# Patient Record
Sex: Male | Born: 1960
Health system: Southern US, Community
[De-identification: ages and names within clinical notes are randomized; demographics above are authoritative.]

## PROBLEM LIST (undated history)

## (undated) DIAGNOSIS — K219 Gastro-esophageal reflux disease without esophagitis: Secondary | ICD-10-CM

## (undated) DIAGNOSIS — E785 Hyperlipidemia, unspecified: Secondary | ICD-10-CM

## (undated) DIAGNOSIS — F419 Anxiety disorder, unspecified: Secondary | ICD-10-CM

## (undated) DIAGNOSIS — F32A Depression, unspecified: Secondary | ICD-10-CM

## (undated) DIAGNOSIS — F329 Major depressive disorder, single episode, unspecified: Secondary | ICD-10-CM

## (undated) DIAGNOSIS — R51 Headache: Secondary | ICD-10-CM

## (undated) DIAGNOSIS — Z531 Procedure and treatment not carried out because of patient's decision for reasons of belief and group pressure: Secondary | ICD-10-CM

## (undated) DIAGNOSIS — J449 Chronic obstructive pulmonary disease, unspecified: Secondary | ICD-10-CM

## (undated) DIAGNOSIS — M5020 Other cervical disc displacement, unspecified cervical region: Secondary | ICD-10-CM

## (undated) DIAGNOSIS — I10 Essential (primary) hypertension: Secondary | ICD-10-CM

## (undated) DIAGNOSIS — M1990 Unspecified osteoarthritis, unspecified site: Secondary | ICD-10-CM

## (undated) DIAGNOSIS — G5600 Carpal tunnel syndrome, unspecified upper limb: Secondary | ICD-10-CM

## (undated) DIAGNOSIS — T7840XA Allergy, unspecified, initial encounter: Secondary | ICD-10-CM

## (undated) DIAGNOSIS — G4730 Sleep apnea, unspecified: Secondary | ICD-10-CM

## (undated) DIAGNOSIS — IMO0001 Reserved for inherently not codable concepts without codable children: Secondary | ICD-10-CM

## (undated) DIAGNOSIS — G629 Polyneuropathy, unspecified: Secondary | ICD-10-CM

## (undated) DIAGNOSIS — Z889 Allergy status to unspecified drugs, medicaments and biological substances status: Secondary | ICD-10-CM

## (undated) DIAGNOSIS — J189 Pneumonia, unspecified organism: Secondary | ICD-10-CM

## (undated) HISTORY — DX: Anxiety disorder, unspecified: F41.9

## (undated) HISTORY — PX: COLONOSCOPY: SHX174

## (undated) HISTORY — DX: Hyperlipidemia, unspecified: E78.5

## (undated) HISTORY — DX: Allergy, unspecified, initial encounter: T78.40XA

## (undated) HISTORY — PX: UPPER GASTROINTESTINAL ENDOSCOPY: SHX188

## (undated) HISTORY — DX: Chronic obstructive pulmonary disease, unspecified: J44.9

## (undated) HISTORY — PX: CARPAL TUNNEL RELEASE: SHX101

---

## 1998-06-01 ENCOUNTER — Emergency Department (HOSPITAL_COMMUNITY): Admission: EM | Admit: 1998-06-01 | Discharge: 1998-06-01 | Payer: Self-pay | Admitting: Emergency Medicine

## 1998-07-24 ENCOUNTER — Encounter: Payer: Self-pay | Admitting: Emergency Medicine

## 1998-07-24 ENCOUNTER — Emergency Department (HOSPITAL_COMMUNITY): Admission: EM | Admit: 1998-07-24 | Discharge: 1998-07-24 | Payer: Self-pay | Admitting: Emergency Medicine

## 1999-05-29 ENCOUNTER — Emergency Department (HOSPITAL_COMMUNITY): Admission: EM | Admit: 1999-05-29 | Discharge: 1999-05-29 | Payer: Self-pay | Admitting: Emergency Medicine

## 1999-09-16 ENCOUNTER — Emergency Department (HOSPITAL_COMMUNITY): Admission: EM | Admit: 1999-09-16 | Discharge: 1999-09-16 | Payer: Self-pay | Admitting: Emergency Medicine

## 1999-09-18 ENCOUNTER — Encounter: Admission: RE | Admit: 1999-09-18 | Discharge: 1999-09-18 | Payer: Self-pay | Admitting: Hematology and Oncology

## 1999-09-25 ENCOUNTER — Encounter: Admission: RE | Admit: 1999-09-25 | Discharge: 1999-09-25 | Payer: Self-pay | Admitting: Hematology and Oncology

## 1999-11-06 ENCOUNTER — Encounter: Admission: RE | Admit: 1999-11-06 | Discharge: 1999-11-06 | Payer: Self-pay | Admitting: Internal Medicine

## 2000-05-20 ENCOUNTER — Encounter: Admission: RE | Admit: 2000-05-20 | Discharge: 2000-05-20 | Payer: Self-pay | Admitting: Internal Medicine

## 2000-06-26 ENCOUNTER — Emergency Department (HOSPITAL_COMMUNITY): Admission: EM | Admit: 2000-06-26 | Discharge: 2000-06-26 | Payer: Self-pay

## 2000-06-26 ENCOUNTER — Encounter: Payer: Self-pay | Admitting: Emergency Medicine

## 2001-01-07 ENCOUNTER — Encounter: Admission: RE | Admit: 2001-01-07 | Discharge: 2001-01-07 | Payer: Self-pay | Admitting: Family Medicine

## 2001-01-07 ENCOUNTER — Encounter: Payer: Self-pay | Admitting: Family Medicine

## 2001-01-26 ENCOUNTER — Emergency Department (HOSPITAL_COMMUNITY): Admission: EM | Admit: 2001-01-26 | Discharge: 2001-01-26 | Payer: Self-pay | Admitting: *Deleted

## 2001-02-17 HISTORY — PX: OTHER SURGICAL HISTORY: SHX169

## 2001-07-23 ENCOUNTER — Ambulatory Visit (HOSPITAL_BASED_OUTPATIENT_CLINIC_OR_DEPARTMENT_OTHER): Admission: RE | Admit: 2001-07-23 | Discharge: 2001-07-23 | Payer: Self-pay | Admitting: Orthopedic Surgery

## 2001-08-05 ENCOUNTER — Encounter: Admission: RE | Admit: 2001-08-05 | Discharge: 2001-10-01 | Payer: Self-pay | Admitting: Orthopedic Surgery

## 2001-12-06 ENCOUNTER — Inpatient Hospital Stay (HOSPITAL_COMMUNITY): Admission: EM | Admit: 2001-12-06 | Discharge: 2001-12-16 | Payer: Self-pay | Admitting: Emergency Medicine

## 2001-12-06 ENCOUNTER — Encounter: Payer: Self-pay | Admitting: Emergency Medicine

## 2001-12-07 ENCOUNTER — Encounter: Payer: Self-pay | Admitting: Orthopaedic Surgery

## 2001-12-20 ENCOUNTER — Encounter: Payer: Self-pay | Admitting: Emergency Medicine

## 2001-12-20 ENCOUNTER — Emergency Department (HOSPITAL_COMMUNITY): Admission: EM | Admit: 2001-12-20 | Discharge: 2001-12-21 | Payer: Self-pay | Admitting: Emergency Medicine

## 2002-01-06 ENCOUNTER — Encounter: Payer: Self-pay | Admitting: Emergency Medicine

## 2002-01-06 ENCOUNTER — Emergency Department (HOSPITAL_COMMUNITY): Admission: EM | Admit: 2002-01-06 | Discharge: 2002-01-06 | Payer: Self-pay | Admitting: Emergency Medicine

## 2002-01-11 ENCOUNTER — Encounter: Admission: RE | Admit: 2002-01-11 | Discharge: 2002-01-11 | Payer: Self-pay | Admitting: Cardiology

## 2002-01-11 ENCOUNTER — Encounter: Payer: Self-pay | Admitting: Cardiology

## 2002-01-21 ENCOUNTER — Encounter: Admission: RE | Admit: 2002-01-21 | Discharge: 2002-03-24 | Payer: Self-pay | Admitting: Neurology

## 2002-03-29 ENCOUNTER — Ambulatory Visit (HOSPITAL_BASED_OUTPATIENT_CLINIC_OR_DEPARTMENT_OTHER): Admission: RE | Admit: 2002-03-29 | Discharge: 2002-03-30 | Payer: Self-pay | Admitting: Orthopaedic Surgery

## 2002-04-07 ENCOUNTER — Encounter: Admission: RE | Admit: 2002-04-07 | Discharge: 2002-07-06 | Payer: Self-pay | Admitting: Orthopaedic Surgery

## 2002-06-27 ENCOUNTER — Encounter
Admission: RE | Admit: 2002-06-27 | Discharge: 2002-09-25 | Payer: Self-pay | Admitting: Physical Medicine & Rehabilitation

## 2002-08-17 ENCOUNTER — Encounter
Admission: RE | Admit: 2002-08-17 | Discharge: 2002-11-15 | Payer: Self-pay | Admitting: Physical Medicine & Rehabilitation

## 2002-08-26 ENCOUNTER — Encounter: Admission: RE | Admit: 2002-08-26 | Discharge: 2002-08-26 | Payer: Self-pay | Admitting: Cardiology

## 2002-08-26 ENCOUNTER — Encounter: Payer: Self-pay | Admitting: Cardiology

## 2002-09-13 ENCOUNTER — Encounter: Payer: Self-pay | Admitting: *Deleted

## 2002-09-13 ENCOUNTER — Ambulatory Visit (HOSPITAL_COMMUNITY): Admission: RE | Admit: 2002-09-13 | Discharge: 2002-09-13 | Payer: Self-pay | Admitting: *Deleted

## 2002-10-21 ENCOUNTER — Encounter
Admission: RE | Admit: 2002-10-21 | Discharge: 2003-01-19 | Payer: Self-pay | Admitting: Physical Medicine & Rehabilitation

## 2003-01-03 ENCOUNTER — Encounter: Admission: RE | Admit: 2003-01-03 | Discharge: 2003-04-03 | Payer: Self-pay

## 2003-02-18 HISTORY — PX: CERVICAL FUSION: SHX112

## 2003-03-31 ENCOUNTER — Encounter
Admission: RE | Admit: 2003-03-31 | Discharge: 2003-06-29 | Payer: Self-pay | Admitting: Physical Medicine & Rehabilitation

## 2003-08-18 ENCOUNTER — Emergency Department (HOSPITAL_COMMUNITY): Admission: EM | Admit: 2003-08-18 | Discharge: 2003-08-18 | Payer: Self-pay | Admitting: Emergency Medicine

## 2003-08-30 ENCOUNTER — Emergency Department (HOSPITAL_COMMUNITY): Admission: EM | Admit: 2003-08-30 | Discharge: 2003-08-30 | Payer: Self-pay | Admitting: Family Medicine

## 2003-12-19 ENCOUNTER — Ambulatory Visit (HOSPITAL_BASED_OUTPATIENT_CLINIC_OR_DEPARTMENT_OTHER): Admission: RE | Admit: 2003-12-19 | Discharge: 2003-12-19 | Payer: Self-pay | Admitting: Cardiology

## 2004-02-26 ENCOUNTER — Encounter: Admission: RE | Admit: 2004-02-26 | Discharge: 2004-03-20 | Payer: Self-pay | Admitting: Cardiology

## 2004-06-20 ENCOUNTER — Encounter: Admission: RE | Admit: 2004-06-20 | Discharge: 2004-06-20 | Payer: Self-pay | Admitting: Cardiology

## 2004-06-23 ENCOUNTER — Emergency Department (HOSPITAL_COMMUNITY): Admission: EM | Admit: 2004-06-23 | Discharge: 2004-06-23 | Payer: Self-pay | Admitting: Emergency Medicine

## 2004-08-17 ENCOUNTER — Ambulatory Visit (HOSPITAL_COMMUNITY): Admission: RE | Admit: 2004-08-17 | Discharge: 2004-08-17 | Payer: Self-pay | Admitting: Neurology

## 2004-10-31 ENCOUNTER — Emergency Department (HOSPITAL_COMMUNITY): Admission: EM | Admit: 2004-10-31 | Discharge: 2004-10-31 | Payer: Self-pay | Admitting: Family Medicine

## 2005-01-16 ENCOUNTER — Ambulatory Visit (HOSPITAL_COMMUNITY): Admission: RE | Admit: 2005-01-16 | Discharge: 2005-01-16 | Payer: Self-pay | Admitting: Neurological Surgery

## 2005-01-22 ENCOUNTER — Encounter: Admission: RE | Admit: 2005-01-22 | Discharge: 2005-01-22 | Payer: Self-pay | Admitting: Neurological Surgery

## 2005-02-14 ENCOUNTER — Ambulatory Visit (HOSPITAL_COMMUNITY): Admission: RE | Admit: 2005-02-14 | Discharge: 2005-02-14 | Payer: Self-pay | Admitting: Neurological Surgery

## 2005-03-13 ENCOUNTER — Ambulatory Visit (HOSPITAL_COMMUNITY): Admission: RE | Admit: 2005-03-13 | Discharge: 2005-03-14 | Payer: Self-pay | Admitting: Neurological Surgery

## 2005-03-22 ENCOUNTER — Emergency Department (HOSPITAL_COMMUNITY): Admission: AD | Admit: 2005-03-22 | Discharge: 2005-03-22 | Payer: Self-pay | Admitting: Emergency Medicine

## 2005-04-08 ENCOUNTER — Encounter: Admission: RE | Admit: 2005-04-08 | Discharge: 2005-04-08 | Payer: Self-pay | Admitting: Neurological Surgery

## 2005-04-24 ENCOUNTER — Encounter: Admission: RE | Admit: 2005-04-24 | Discharge: 2005-07-23 | Payer: Self-pay | Admitting: Neurology

## 2005-06-10 ENCOUNTER — Encounter: Admission: RE | Admit: 2005-06-10 | Discharge: 2005-06-10 | Payer: Self-pay | Admitting: Neurological Surgery

## 2005-10-20 ENCOUNTER — Emergency Department (HOSPITAL_COMMUNITY): Admission: EM | Admit: 2005-10-20 | Discharge: 2005-10-21 | Payer: Self-pay | Admitting: Emergency Medicine

## 2005-11-25 ENCOUNTER — Ambulatory Visit: Payer: Self-pay | Admitting: Family Medicine

## 2005-12-23 ENCOUNTER — Ambulatory Visit (HOSPITAL_COMMUNITY): Admission: RE | Admit: 2005-12-23 | Discharge: 2005-12-23 | Payer: Self-pay | Admitting: Orthopedic Surgery

## 2005-12-26 ENCOUNTER — Ambulatory Visit: Payer: Self-pay | Admitting: Family Medicine

## 2005-12-26 ENCOUNTER — Encounter: Admission: RE | Admit: 2005-12-26 | Discharge: 2005-12-26 | Payer: Self-pay | Admitting: Family Medicine

## 2006-01-14 ENCOUNTER — Ambulatory Visit (HOSPITAL_BASED_OUTPATIENT_CLINIC_OR_DEPARTMENT_OTHER): Admission: RE | Admit: 2006-01-14 | Discharge: 2006-01-14 | Payer: Self-pay | Admitting: Orthopedic Surgery

## 2006-01-21 ENCOUNTER — Ambulatory Visit: Payer: Self-pay | Admitting: Family Medicine

## 2006-01-22 ENCOUNTER — Encounter: Admission: RE | Admit: 2006-01-22 | Discharge: 2006-02-13 | Payer: Self-pay | Admitting: Orthopedic Surgery

## 2006-02-02 ENCOUNTER — Ambulatory Visit: Payer: Self-pay | Admitting: Family Medicine

## 2006-02-18 ENCOUNTER — Ambulatory Visit: Payer: Self-pay | Admitting: Family Medicine

## 2006-03-07 ENCOUNTER — Emergency Department (HOSPITAL_COMMUNITY): Admission: EM | Admit: 2006-03-07 | Discharge: 2006-03-07 | Payer: Self-pay | Admitting: Family Medicine

## 2006-04-16 DIAGNOSIS — F319 Bipolar disorder, unspecified: Secondary | ICD-10-CM | POA: Insufficient documentation

## 2006-04-16 DIAGNOSIS — K219 Gastro-esophageal reflux disease without esophagitis: Secondary | ICD-10-CM | POA: Insufficient documentation

## 2006-04-16 DIAGNOSIS — G4700 Insomnia, unspecified: Secondary | ICD-10-CM | POA: Insufficient documentation

## 2006-04-16 DIAGNOSIS — M1990 Unspecified osteoarthritis, unspecified site: Secondary | ICD-10-CM | POA: Insufficient documentation

## 2006-06-15 ENCOUNTER — Encounter (INDEPENDENT_AMBULATORY_CARE_PROVIDER_SITE_OTHER): Payer: Self-pay | Admitting: Family Medicine

## 2006-06-15 ENCOUNTER — Ambulatory Visit: Payer: Self-pay | Admitting: Sports Medicine

## 2006-06-15 DIAGNOSIS — J309 Allergic rhinitis, unspecified: Secondary | ICD-10-CM | POA: Insufficient documentation

## 2006-06-15 LAB — CONVERTED CEMR LAB
Calcium: 8.9 mg/dL (ref 8.4–10.5)
Phosphorus: 3.4 mg/dL (ref 2.3–4.6)
Sodium: 143 meq/L (ref 135–145)
Total Protein: 7 g/dL (ref 6.0–8.3)

## 2006-06-20 ENCOUNTER — Encounter (INDEPENDENT_AMBULATORY_CARE_PROVIDER_SITE_OTHER): Payer: Self-pay | Admitting: Family Medicine

## 2006-06-28 ENCOUNTER — Ambulatory Visit (HOSPITAL_COMMUNITY): Admission: RE | Admit: 2006-06-28 | Discharge: 2006-06-28 | Payer: Self-pay | Admitting: Anesthesiology

## 2006-06-30 ENCOUNTER — Encounter: Admission: RE | Admit: 2006-06-30 | Discharge: 2006-06-30 | Payer: Self-pay | Admitting: Neurological Surgery

## 2006-09-11 ENCOUNTER — Ambulatory Visit: Payer: Self-pay | Admitting: Family Medicine

## 2006-09-17 ENCOUNTER — Encounter: Admission: RE | Admit: 2006-09-17 | Discharge: 2006-09-17 | Payer: Self-pay | Admitting: Orthopedic Surgery

## 2006-10-01 ENCOUNTER — Encounter: Admission: RE | Admit: 2006-10-01 | Discharge: 2006-10-01 | Payer: Self-pay | Admitting: Orthopedic Surgery

## 2006-10-09 ENCOUNTER — Ambulatory Visit: Payer: Self-pay | Admitting: Family Medicine

## 2006-10-09 DIAGNOSIS — G43909 Migraine, unspecified, not intractable, without status migrainosus: Secondary | ICD-10-CM | POA: Insufficient documentation

## 2006-10-15 ENCOUNTER — Encounter: Admission: RE | Admit: 2006-10-15 | Discharge: 2006-10-15 | Payer: Self-pay | Admitting: Orthopedic Surgery

## 2006-11-19 ENCOUNTER — Encounter (INDEPENDENT_AMBULATORY_CARE_PROVIDER_SITE_OTHER): Payer: Self-pay | Admitting: Family Medicine

## 2006-11-24 ENCOUNTER — Telehealth (INDEPENDENT_AMBULATORY_CARE_PROVIDER_SITE_OTHER): Payer: Self-pay | Admitting: Family Medicine

## 2006-11-24 ENCOUNTER — Telehealth (INDEPENDENT_AMBULATORY_CARE_PROVIDER_SITE_OTHER): Payer: Self-pay | Admitting: *Deleted

## 2006-11-24 ENCOUNTER — Ambulatory Visit: Payer: Self-pay | Admitting: Family Medicine

## 2006-12-11 ENCOUNTER — Ambulatory Visit: Payer: Self-pay | Admitting: Family Medicine

## 2006-12-11 DIAGNOSIS — G4733 Obstructive sleep apnea (adult) (pediatric): Secondary | ICD-10-CM | POA: Insufficient documentation

## 2007-02-22 ENCOUNTER — Encounter: Admission: RE | Admit: 2007-02-22 | Discharge: 2007-02-22 | Payer: Self-pay | Admitting: *Deleted

## 2007-02-22 ENCOUNTER — Ambulatory Visit: Payer: Self-pay | Admitting: Family Medicine

## 2007-02-23 ENCOUNTER — Encounter (INDEPENDENT_AMBULATORY_CARE_PROVIDER_SITE_OTHER): Payer: Self-pay | Admitting: *Deleted

## 2007-02-23 ENCOUNTER — Ambulatory Visit: Payer: Self-pay | Admitting: Family Medicine

## 2007-02-23 LAB — CONVERTED CEMR LAB
Basophils Relative: 0 % (ref 0–1)
Eosinophils Absolute: 0.2 10*3/uL (ref 0.0–0.7)
HCT: 42 % (ref 39.0–52.0)
MCHC: 33.3 g/dL (ref 30.0–36.0)
MCV: 97.2 fL (ref 78.0–100.0)
Monocytes Absolute: 1.5 10*3/uL — ABNORMAL HIGH (ref 0.1–1.0)
Monocytes Relative: 23 % — ABNORMAL HIGH (ref 3–12)
Neutro Abs: 2.8 10*3/uL (ref 1.7–7.7)
Neutrophils Relative %: 44 % (ref 43–77)
RBC: 4.32 M/uL (ref 4.22–5.81)

## 2007-05-25 ENCOUNTER — Ambulatory Visit: Payer: Self-pay | Admitting: Sports Medicine

## 2007-05-25 DIAGNOSIS — M2170 Unequal limb length (acquired), unspecified site: Secondary | ICD-10-CM | POA: Insufficient documentation

## 2007-05-26 ENCOUNTER — Encounter: Admission: RE | Admit: 2007-05-26 | Discharge: 2007-05-26 | Payer: Self-pay | Admitting: Sports Medicine

## 2007-06-01 ENCOUNTER — Telehealth (INDEPENDENT_AMBULATORY_CARE_PROVIDER_SITE_OTHER): Payer: Self-pay | Admitting: Family Medicine

## 2007-06-02 ENCOUNTER — Ambulatory Visit: Payer: Self-pay | Admitting: Family Medicine

## 2007-06-02 ENCOUNTER — Telehealth: Payer: Self-pay | Admitting: Psychology

## 2007-06-02 ENCOUNTER — Encounter: Payer: Self-pay | Admitting: Psychology

## 2007-06-03 ENCOUNTER — Encounter: Payer: Self-pay | Admitting: Psychology

## 2007-06-03 LAB — CONVERTED CEMR LAB: Ammonia: 51 umol/L — ABNORMAL HIGH (ref 11–35)

## 2007-06-04 ENCOUNTER — Encounter (INDEPENDENT_AMBULATORY_CARE_PROVIDER_SITE_OTHER): Payer: Self-pay | Admitting: Family Medicine

## 2007-06-04 ENCOUNTER — Ambulatory Visit: Payer: Self-pay | Admitting: Family Medicine

## 2007-06-04 LAB — CONVERTED CEMR LAB
Hemoglobin: 16.6 g/dL (ref 13.0–17.0)
MCHC: 33.3 g/dL (ref 30.0–36.0)
MCV: 95.8 fL (ref 78.0–100.0)
Platelets: 234 10*3/uL (ref 150–400)
RBC: 5.21 M/uL (ref 4.22–5.81)
RDW: 12.8 % (ref 11.5–15.5)
WBC: 7.4 10*3/uL (ref 4.0–10.5)

## 2007-06-07 ENCOUNTER — Telehealth: Payer: Self-pay | Admitting: *Deleted

## 2007-06-07 ENCOUNTER — Encounter (INDEPENDENT_AMBULATORY_CARE_PROVIDER_SITE_OTHER): Payer: Self-pay | Admitting: Family Medicine

## 2007-08-15 ENCOUNTER — Emergency Department (HOSPITAL_COMMUNITY): Admission: EM | Admit: 2007-08-15 | Discharge: 2007-08-15 | Payer: Self-pay | Admitting: Family Medicine

## 2007-09-27 ENCOUNTER — Encounter: Payer: Self-pay | Admitting: *Deleted

## 2007-09-30 ENCOUNTER — Telehealth: Payer: Self-pay | Admitting: *Deleted

## 2007-10-13 ENCOUNTER — Ambulatory Visit: Payer: Self-pay | Admitting: Family Medicine

## 2007-10-13 ENCOUNTER — Encounter: Payer: Self-pay | Admitting: Family Medicine

## 2007-10-13 LAB — CONVERTED CEMR LAB
Calcium: 8.5 mg/dL (ref 8.4–10.5)
Total Bilirubin: 0.4 mg/dL (ref 0.3–1.2)
Total Protein: 6.1 g/dL (ref 6.0–8.3)

## 2007-10-15 ENCOUNTER — Telehealth: Payer: Self-pay | Admitting: *Deleted

## 2007-10-20 ENCOUNTER — Encounter: Payer: Self-pay | Admitting: Family Medicine

## 2007-10-21 ENCOUNTER — Encounter: Payer: Self-pay | Admitting: Family Medicine

## 2007-11-08 ENCOUNTER — Emergency Department (HOSPITAL_COMMUNITY): Admission: EM | Admit: 2007-11-08 | Discharge: 2007-11-08 | Payer: Self-pay | Admitting: Emergency Medicine

## 2007-11-16 ENCOUNTER — Ambulatory Visit (HOSPITAL_COMMUNITY): Admission: RE | Admit: 2007-11-16 | Discharge: 2007-11-16 | Payer: Self-pay | Admitting: Gastroenterology

## 2007-12-01 ENCOUNTER — Encounter: Payer: Self-pay | Admitting: Family Medicine

## 2007-12-08 ENCOUNTER — Ambulatory Visit: Payer: Self-pay | Admitting: Family Medicine

## 2007-12-08 ENCOUNTER — Telehealth: Payer: Self-pay | Admitting: Family Medicine

## 2007-12-09 ENCOUNTER — Encounter: Payer: Self-pay | Admitting: Family Medicine

## 2007-12-20 ENCOUNTER — Encounter: Payer: Self-pay | Admitting: Family Medicine

## 2007-12-20 ENCOUNTER — Ambulatory Visit: Payer: Self-pay | Admitting: Family Medicine

## 2007-12-20 LAB — CONVERTED CEMR LAB
Basophils Absolute: 0 10*3/uL (ref 0.0–0.1)
Eosinophils Absolute: 0.2 10*3/uL (ref 0.0–0.7)
Eosinophils Relative: 3 % (ref 0–5)
HCT: 45.4 % (ref 39.0–52.0)
MCHC: 33 g/dL (ref 30.0–36.0)
Monocytes Relative: 10 % (ref 3–12)
Neutrophils Relative %: 36 % — ABNORMAL LOW (ref 43–77)
RBC: 4.88 M/uL (ref 4.22–5.81)
WBC: 8.3 10*3/uL (ref 4.0–10.5)

## 2007-12-30 ENCOUNTER — Ambulatory Visit: Payer: Self-pay | Admitting: Family Medicine

## 2008-05-19 ENCOUNTER — Telehealth (INDEPENDENT_AMBULATORY_CARE_PROVIDER_SITE_OTHER): Payer: Self-pay | Admitting: *Deleted

## 2008-05-25 ENCOUNTER — Telehealth (INDEPENDENT_AMBULATORY_CARE_PROVIDER_SITE_OTHER): Payer: Self-pay | Admitting: *Deleted

## 2008-05-30 ENCOUNTER — Ambulatory Visit: Payer: Self-pay | Admitting: Family Medicine

## 2008-05-30 DIAGNOSIS — Z87891 Personal history of nicotine dependence: Secondary | ICD-10-CM

## 2008-05-30 DIAGNOSIS — Z720 Tobacco use: Secondary | ICD-10-CM | POA: Insufficient documentation

## 2008-05-31 ENCOUNTER — Telehealth: Payer: Self-pay | Admitting: *Deleted

## 2008-05-31 ENCOUNTER — Telehealth: Payer: Self-pay | Admitting: Family Medicine

## 2008-06-06 ENCOUNTER — Emergency Department (HOSPITAL_COMMUNITY): Admission: EM | Admit: 2008-06-06 | Discharge: 2008-06-06 | Payer: Self-pay | Admitting: Family Medicine

## 2008-06-07 ENCOUNTER — Telehealth: Payer: Self-pay | Admitting: Family Medicine

## 2008-06-09 ENCOUNTER — Telehealth (INDEPENDENT_AMBULATORY_CARE_PROVIDER_SITE_OTHER): Payer: Self-pay | Admitting: *Deleted

## 2008-06-12 ENCOUNTER — Encounter: Payer: Self-pay | Admitting: Family Medicine

## 2008-06-27 ENCOUNTER — Ambulatory Visit: Payer: Self-pay | Admitting: Family Medicine

## 2008-06-27 ENCOUNTER — Encounter: Payer: Self-pay | Admitting: Family Medicine

## 2008-06-27 LAB — CONVERTED CEMR LAB
ALT: 8 units/L (ref 0–53)
AST: 12 units/L (ref 0–37)
Albumin: 4 g/dL (ref 3.5–5.2)
Alkaline Phosphatase: 77 units/L (ref 39–117)
BUN: 12 mg/dL (ref 6–23)
CO2: 20 meq/L (ref 19–32)
Glucose, Bld: 81 mg/dL (ref 70–99)
HCT: 43.7 % (ref 39.0–52.0)
HDL: 34 mg/dL — ABNORMAL LOW (ref >39)
LDL Cholesterol: 71 mg/dL (ref 0–99)
MCHC: 33.6 g/dL (ref 30.0–36.0)
RBC: 4.72 M/uL (ref 4.22–5.81)
Total CHOL/HDL Ratio: 3.6
Triglycerides: 96 mg/dL (ref <150)

## 2008-06-28 ENCOUNTER — Telehealth (INDEPENDENT_AMBULATORY_CARE_PROVIDER_SITE_OTHER): Payer: Self-pay | Admitting: *Deleted

## 2008-06-28 ENCOUNTER — Ambulatory Visit: Payer: Self-pay | Admitting: Sports Medicine

## 2008-06-28 DIAGNOSIS — M25569 Pain in unspecified knee: Secondary | ICD-10-CM | POA: Insufficient documentation

## 2008-07-10 ENCOUNTER — Ambulatory Visit: Payer: Self-pay | Admitting: Family Medicine

## 2008-07-14 ENCOUNTER — Telehealth (INDEPENDENT_AMBULATORY_CARE_PROVIDER_SITE_OTHER): Payer: Self-pay | Admitting: Pharmacist

## 2008-07-21 ENCOUNTER — Encounter (INDEPENDENT_AMBULATORY_CARE_PROVIDER_SITE_OTHER): Payer: Self-pay | Admitting: Pharmacist

## 2008-10-12 ENCOUNTER — Ambulatory Visit: Payer: Self-pay | Admitting: Family Medicine

## 2008-10-16 ENCOUNTER — Encounter: Payer: Self-pay | Admitting: Family Medicine

## 2008-10-17 ENCOUNTER — Encounter: Payer: Self-pay | Admitting: Family Medicine

## 2008-10-17 ENCOUNTER — Ambulatory Visit: Payer: Self-pay | Admitting: Family Medicine

## 2008-10-17 LAB — CONVERTED CEMR LAB
HCT: 39.8 % (ref 39.0–52.0)
MCV: 91.7 fL (ref 78.0–100.0)
RBC: 4.34 M/uL (ref 4.22–5.81)
RDW: 12.9 % (ref 11.5–15.5)

## 2008-10-18 ENCOUNTER — Encounter: Payer: Self-pay | Admitting: Family Medicine

## 2008-10-25 ENCOUNTER — Encounter: Payer: Self-pay | Admitting: Family Medicine

## 2009-01-01 ENCOUNTER — Telehealth: Payer: Self-pay | Admitting: *Deleted

## 2009-01-03 ENCOUNTER — Ambulatory Visit: Payer: Self-pay | Admitting: Family Medicine

## 2009-01-03 DIAGNOSIS — L408 Other psoriasis: Secondary | ICD-10-CM | POA: Insufficient documentation

## 2009-01-21 ENCOUNTER — Emergency Department (HOSPITAL_COMMUNITY): Admission: EM | Admit: 2009-01-21 | Discharge: 2009-01-21 | Payer: Self-pay | Admitting: Emergency Medicine

## 2009-02-27 ENCOUNTER — Ambulatory Visit: Payer: Self-pay | Admitting: Family Medicine

## 2009-02-27 DIAGNOSIS — I10 Essential (primary) hypertension: Secondary | ICD-10-CM | POA: Insufficient documentation

## 2009-02-27 DIAGNOSIS — R634 Abnormal weight loss: Secondary | ICD-10-CM | POA: Insufficient documentation

## 2009-03-07 ENCOUNTER — Encounter: Payer: Self-pay | Admitting: Family Medicine

## 2009-03-07 ENCOUNTER — Telehealth: Payer: Self-pay | Admitting: Family Medicine

## 2009-03-08 ENCOUNTER — Ambulatory Visit: Payer: Self-pay | Admitting: Vascular Surgery

## 2009-03-08 ENCOUNTER — Encounter (INDEPENDENT_AMBULATORY_CARE_PROVIDER_SITE_OTHER): Payer: Self-pay | Admitting: Cardiovascular Disease

## 2009-03-08 ENCOUNTER — Ambulatory Visit: Payer: Self-pay | Admitting: Cardiovascular Disease

## 2009-03-08 ENCOUNTER — Inpatient Hospital Stay (HOSPITAL_COMMUNITY): Admission: EM | Admit: 2009-03-08 | Discharge: 2009-03-10 | Payer: Self-pay | Admitting: Emergency Medicine

## 2009-03-08 ENCOUNTER — Encounter: Payer: Self-pay | Admitting: Cardiology

## 2009-03-09 ENCOUNTER — Encounter: Payer: Self-pay | Admitting: Cardiology

## 2009-03-12 ENCOUNTER — Telehealth (INDEPENDENT_AMBULATORY_CARE_PROVIDER_SITE_OTHER): Payer: Self-pay | Admitting: *Deleted

## 2009-03-14 ENCOUNTER — Ambulatory Visit: Payer: Self-pay | Admitting: Family Medicine

## 2009-03-14 ENCOUNTER — Ambulatory Visit (HOSPITAL_BASED_OUTPATIENT_CLINIC_OR_DEPARTMENT_OTHER): Admission: RE | Admit: 2009-03-14 | Discharge: 2009-03-14 | Payer: Self-pay | Admitting: Family Medicine

## 2009-03-16 ENCOUNTER — Telehealth (INDEPENDENT_AMBULATORY_CARE_PROVIDER_SITE_OTHER): Payer: Self-pay | Admitting: *Deleted

## 2009-03-17 ENCOUNTER — Ambulatory Visit: Payer: Self-pay | Admitting: Internal Medicine

## 2009-03-21 ENCOUNTER — Encounter: Payer: Self-pay | Admitting: Family Medicine

## 2009-03-30 ENCOUNTER — Ambulatory Visit: Payer: Self-pay | Admitting: Family Medicine

## 2009-04-02 ENCOUNTER — Telehealth: Payer: Self-pay | Admitting: Family Medicine

## 2009-04-06 ENCOUNTER — Telehealth: Payer: Self-pay | Admitting: Family Medicine

## 2009-04-07 ENCOUNTER — Ambulatory Visit: Payer: Self-pay | Admitting: Internal Medicine

## 2009-04-07 ENCOUNTER — Inpatient Hospital Stay (HOSPITAL_COMMUNITY): Admission: EM | Admit: 2009-04-07 | Discharge: 2009-04-12 | Payer: Self-pay | Admitting: Gastroenterology

## 2009-04-08 ENCOUNTER — Encounter (INDEPENDENT_AMBULATORY_CARE_PROVIDER_SITE_OTHER): Payer: Self-pay | Admitting: Gastroenterology

## 2009-04-24 ENCOUNTER — Encounter: Payer: Self-pay | Admitting: Family Medicine

## 2009-04-27 ENCOUNTER — Ambulatory Visit: Payer: Self-pay | Admitting: Family Medicine

## 2009-05-02 ENCOUNTER — Telehealth: Payer: Self-pay | Admitting: Internal Medicine

## 2009-05-03 ENCOUNTER — Ambulatory Visit: Payer: Self-pay | Admitting: Family Medicine

## 2009-05-03 ENCOUNTER — Inpatient Hospital Stay (HOSPITAL_COMMUNITY): Admission: AD | Admit: 2009-05-03 | Discharge: 2009-05-08 | Payer: Self-pay | Admitting: Family Medicine

## 2009-05-11 ENCOUNTER — Ambulatory Visit: Payer: Self-pay | Admitting: Internal Medicine

## 2009-05-11 ENCOUNTER — Telehealth: Payer: Self-pay | Admitting: Internal Medicine

## 2009-05-14 ENCOUNTER — Encounter: Payer: Self-pay | Admitting: Family Medicine

## 2009-05-16 ENCOUNTER — Ambulatory Visit: Payer: Self-pay | Admitting: Family Medicine

## 2009-05-28 ENCOUNTER — Encounter: Payer: Self-pay | Admitting: Family Medicine

## 2009-05-28 ENCOUNTER — Encounter: Payer: Self-pay | Admitting: Internal Medicine

## 2009-06-05 ENCOUNTER — Ambulatory Visit: Payer: Self-pay | Admitting: Internal Medicine

## 2009-06-05 ENCOUNTER — Ambulatory Visit: Payer: Self-pay | Admitting: Family Medicine

## 2009-06-11 ENCOUNTER — Encounter: Payer: Self-pay | Admitting: Internal Medicine

## 2009-06-14 ENCOUNTER — Encounter: Payer: Self-pay | Admitting: Family Medicine

## 2009-06-18 ENCOUNTER — Ambulatory Visit: Payer: Self-pay | Admitting: Family Medicine

## 2009-06-18 DIAGNOSIS — M5416 Radiculopathy, lumbar region: Secondary | ICD-10-CM | POA: Insufficient documentation

## 2009-06-18 DIAGNOSIS — M5126 Other intervertebral disc displacement, lumbar region: Secondary | ICD-10-CM | POA: Insufficient documentation

## 2009-06-21 ENCOUNTER — Ambulatory Visit: Payer: Self-pay | Admitting: Internal Medicine

## 2009-06-29 ENCOUNTER — Encounter: Payer: Self-pay | Admitting: Sports Medicine

## 2009-08-14 ENCOUNTER — Encounter: Payer: Self-pay | Admitting: Family Medicine

## 2009-11-06 ENCOUNTER — Ambulatory Visit: Payer: Self-pay | Admitting: Family Medicine

## 2009-11-06 ENCOUNTER — Encounter: Payer: Self-pay | Admitting: Family Medicine

## 2009-11-06 DIAGNOSIS — H938X9 Other specified disorders of ear, unspecified ear: Secondary | ICD-10-CM | POA: Insufficient documentation

## 2009-11-06 LAB — CONVERTED CEMR LAB
Basophils Relative: 0 % (ref 0–1)
Eosinophils Absolute: 0.1 10*3/uL (ref 0.0–0.7)
HCT: 40.1 % (ref 39.0–52.0)
Hemoglobin: 13.4 g/dL (ref 13.0–17.0)
Lymphocytes Relative: 43 % (ref 12–46)
Neutro Abs: 2.7 10*3/uL (ref 1.7–7.7)
Neutrophils Relative %: 45 % (ref 43–77)

## 2009-11-07 ENCOUNTER — Telehealth: Payer: Self-pay | Admitting: *Deleted

## 2009-11-27 ENCOUNTER — Encounter: Payer: Self-pay | Admitting: Family Medicine

## 2009-12-31 ENCOUNTER — Emergency Department (HOSPITAL_COMMUNITY): Admission: EM | Admit: 2009-12-31 | Discharge: 2009-12-31 | Payer: Self-pay | Admitting: Emergency Medicine

## 2010-01-03 ENCOUNTER — Ambulatory Visit: Payer: Self-pay | Admitting: Sports Medicine

## 2010-02-15 ENCOUNTER — Encounter: Payer: Self-pay | Admitting: Family Medicine

## 2010-02-15 ENCOUNTER — Ambulatory Visit: Payer: Self-pay

## 2010-02-15 ENCOUNTER — Encounter (INDEPENDENT_AMBULATORY_CARE_PROVIDER_SITE_OTHER): Payer: Self-pay | Admitting: *Deleted

## 2010-03-10 ENCOUNTER — Encounter: Payer: Self-pay | Admitting: Surgery

## 2010-03-19 NOTE — Assessment & Plan Note (Signed)
Summary: FU/KH   Vital Signs:  Patient profile:   50 year old male Weight:      174.0 pounds Temp:     98.9 degrees F Pulse rate:   54 / minute BP sitting:   109 / 70  Vitals Entered By: Starleen Blue RN (June 05, 2009 10:39 AM) CC: f/u Is Patient Diabetic? No Pain Assessment Patient in pain? no        Primary Care Provider:  Lequita Asal  MD  - FPTS, Dr Jeani Hawking - GI, Dr. Karie Soda - [facility]  CC:  f/u.  History of Present Illness: 50 y/o here for f/u  cough- has resumed. has appt with Dr. Sherene Sires scheduled today.  weight loss- since zyprexa and megace stopped, patient with anorexia per wife. has lost >10 lbs since last office visit.   mood- unchanged. awaiting psych referral. does not appear hypomanic today.  Insomnia- worsening. no relief with benadryl. out of ambien. would like refill.  Habits & Providers  Alcohol-Tobacco-Diet     Tobacco Status: quit     Tobacco Counseling: to remain off tobacco products  Current Medications (verified): 1)  Topamax 200 Mg Tabs (Topiramate) .... One Tab By Mouth Qhs 2)  Dexilant 60 Mg Cpdr (Dexlansoprazole) .... Take 1 Tablet By Mouth Once A Day 3)  Sucralfate 1 Gm Tabs (Sucralfate) .... One At Bedtime 4)  Fluoxetine Hcl 20 Mg Tabs (Fluoxetine Hcl) .Marland Kitchen.. 1 Tab By Mouth Daily 5)  Proventil Hfa 108 (90 Base) Mcg/act Aers (Albuterol Sulfate) .... Two Puffs Q4-Q6 Hours As Needed For Shortness of Breath 6)  Metoprolol Tartrate 50 Mg Tabs (Metoprolol Tartrate) .... One Tab By Mouth Bid 7)  Neurontin 300 Mg Caps (Gabapentin) .... Take 300mg  Three Times A Day  Allergies (verified): 1)  ! Morphine 2)  ! Demerol 3)  ! Dilaudid (Hydromorphone Hcl)  Physical Exam  General:  Alert, NAD; vitals reviewed.  Mouth:  MMM Lungs:  Normal respiratory effort, chest expands symmetrically. Lungs are clear to auscultation, no crackles or wheezes. Heart:  Normal rate and regular rhythm. S1 and S2 normal without gallop, murmur, click, rub or  other extra sounds. Abdomen:  Bowel sounds positive,abdomen soft and non-tender without masses, organomegaly or hernias noted. Psych:  Oriented X3, flat affect, and subdued.     Impression & Recommendations:  Problem # 1:  WEIGHT LOSS, ABNORMAL (ICD-783.21) Assessment New  ?mood related since no longer on zyprexa vs. related to cough. resume megace  Orders: [facility]- Est Level  3 ([address])  Problem # 2:  HYPERTENSION, BENIGN ESSENTIAL (ICD-401.1) Assessment: Improved  stop metoprolol per Dr. Sherene Sires. start bystolic.  His updated medication list for this problem includes:    Bystolic 5 Mg Tabs (Nebivolol hcl) ..... One tablet daily  Orders: [facility]- Est Level  3 ([address])  Problem # 3:  BIPOLAR DISORDER UNSPECIFIED (ICD-296.80) Assessment: Unchanged  await psych referral.   Orders: [facility]- Est Level  3 ([address])  Problem # 4:  INSOMNIA NOS (ICD-780.52) Assessment: Deteriorated restart ambien  His updated medication list for this problem includes:    Ambien Cr 12.5 Mg Cr-tabs (Zolpidem tartrate) ..... One tab by mouth 30 minutes before bedtime as needed for trouble sleeping Prescriptions: AMBIEN CR 12.5 MG CR-TABS (ZOLPIDEM TARTRATE) one tab by mouth 30 minutes before bedtime as needed for trouble sleeping  #30 x 4   Entered and Authorized by:   Lequita Asal  MD   Signed by:   Lequita Asal  MD on 06/05/2009  Method used:   Print then Give to Patient   RxID:   [id number] METOPROLOL TARTRATE 50 MG TABS (METOPROLOL TARTRATE) 1/2 tab by mouth bid  #31 x 3   Entered and Authorized by:   Lequita Asal  MD   Signed by:   Lequita Asal  MD on 06/05/2009   Method used:   Electronically to        [facility] [id number] (retail)       [address] [place], [place]  [address]       Ph: [phone]       Fax: [phone]   RxID:   [id number] FLUOXETINE HCL 20 MG TABS (FLUOXETINE HCL) 1 tab by mouth daily  #30 x 3   Entered and Authorized  by:   Lequita Asal  MD   Signed by:   Lequita Asal  MD on 06/05/2009   Method used:   Electronically to        [facility] [id number] (retail)       [address] [place], [place]  [address]       Ph: [phone]       Fax: [phone]   RxID:   [id number] MEGACE ORAL 40 MG/ML SUSP (MEGESTROL ACETATE) 800 mg by mouth daily (20 ml). disp 1 month supply  #1 x 3   Entered and Authorized by:   Lequita Asal  MD   Signed by:   Lequita Asal  MD on 06/05/2009   Method used:   Electronically to        [facility] [id number] (retail)       [address] [place], [place]  [address]       Ph: [phone]       Fax: [phone]   RxID:   [id number]

## 2010-03-19 NOTE — Assessment & Plan Note (Signed)
Summary: FU/KH   Vital Signs:  Patient profile:   50 year old male Weight:      188.4 pounds Temp:     98.1 degrees F oral Pulse rate:   81 / minute BP sitting:   137 / 85  (left arm) Cuff size:   large  Vitals Entered By: Loralee Pacas CMA (April 27, 2009 10:41 AM)  Primary Care Provider:  Lequita Asal  MD  CC:  f/u mood and BP.  History of Present Illness: 50 y/o male here for f/u   bipolar depression- per pt and wife, mood improved on current regimen. weight stable. on zyprexa 2.5 mg, depakote, lamictal, and fluoxetine. denies SI/HI. continued difficulty sleeping  weight gain- has stabilized   elevated BP- during recent hospitalization, but with very elevated BP. started on metoprolol 50 mg two times a day.   cough/vomiting- recent hospitalization x5 days with extensive work up. wife states that symptoms seemed to start around same time of getting [place] Husky dog which sheds a lot. +cough, itching, post-tussive emesis.   Habits & Providers  Alcohol-Tobacco-Diet     Tobacco Status: quit     Tobacco Counseling: to remain off tobacco products  Current Medications (verified): 1)  Depakote Er 500 Mg  Tb24 (Divalproex Sodium) .... Please Take Two Daily With Food.  Taking At Bedtime 2)  Lamictal 150 Mg  Tabs (Lamotrigine) .... Please Take One Pill At Approximately The Same Time Each Day. 3)  Topamax 200 Mg Tabs (Topiramate) .... One Tab By Mouth Qhs 4)  Kapidex 60 Mg Cpdr (Dexlansoprazole) .... One Daily 5)  Sucralfate 1 Gm Tabs (Sucralfate) .... One At Bedtime 6)  Nexium 40 Mg Pack (Esomeprazole Magnesium) .... One Tab By Mouth Daily 7)  Ambien Cr 12.5 Mg Cr-Tabs (Zolpidem Tartrate) .... One Tab By Mouth At Bedtime As Needed Difficulty Sleeping 8)  Zyprexa 2.5 Mg Tabs (Olanzapine) .... One Half Tab By Mouth Qhs 9)  Triamcinolone Acetonide 0.1 % Oint (Triamcinolone Acetonide) .... Apply To Affected Areas Two Times A Day As Needed For Excessively Dry Skin. Disp 90 G  Container 10)  Fluoxetine Hcl 20 Mg Tabs (Fluoxetine Hcl) .... One Tab By Mouth At Bedtime With Olanzapine (Zyprexa) For Mood 11)  Proventil Hfa 108 (90 Base) Mcg/act Aers (Albuterol Sulfate) .... Two Puffs Q4-Q6 Hours As Needed For Shortness of Breath 12)  Metformin Hcl 500 Mg Tabs (Metformin Hcl) .... One Tab By Mouth Bid 13)  Loratadine 10 Mg Tabs (Loratadine) .... One Tab By Mouth Daily  Allergies (verified): 1)  ! Morphine 2)  ! Demerol 3)  ! Dilaudid (Hydromorphone Hcl)  Social History: Smoking Status:  quit  Physical Exam  General:  Alert, NAD; vitals reviewed. Psych:  normally interactive.  affect improved.    Impression & Recommendations:  Problem # 1:  BIPOLAR DISORDER UNSPECIFIED (ICD-296.80) Assessment Improved  continue current regimen.   Orders: [facility]- Est Level  3 (40981)  Problem # 2:  WEIGHT GAIN, ABNORMAL (ICD-783.1) Assessment: Improved  stable.   Orders: [facility]- Est Level  3 (19[address])  Problem # 3:  HYPERTENSION, BENIGN ESSENTIAL (ICD-401.1) Assessment: New  continue metoprolol and monitor.   His updated medication list for this problem includes:    Metoprolol Tartrate 50 Mg Tabs (Metoprolol tartrate) ..... One tab by mouth bid  Orders: [facility]- Est Level  3 (8[address])  Problem # 4:  ? of ALLERGY, DOG DANDER (ICD-477.8) Assessment: New patient unwilling to consider getting rid of dog. will try loratadine.  Patient Instructions: 1)  Follow up with Dr. Lanier Prude in 4-6 weeks Prescriptions: LORATADINE 10 MG TABS (LORATADINE) one tab by mouth daily  #30 x 2   Entered and Authorized by:   Lequita Asal  MD   Signed by:   Lequita Asal  MD on 04/27/2009   Method used:   Electronically to        [facility] [id number] (retail)       [address] [place], [place]  [address]       Ph: [phone]       Fax: [phone]   RxID:   [id number]    Prevention & Chronic Care Immunizations   Influenza vaccine: given   (12/14/2007)   Influenza vaccine deferral: Not available  (10/12/2008)   Influenza vaccine due: 12/13/2008    Tetanus booster: 12/06/2001: given   Tetanus booster due: 12/07/2011    Pneumococcal vaccine: Not documented  Colorectal Screening   Hemoccult: Not documented   Hemoccult action/deferral: Deferred  (10/12/2008)    Colonoscopy: Not documented   Colonoscopy action/deferral: GI referral  (10/12/2008)  Other Screening   PSA: Not documented   PSA action/deferral: Discussion deferred  (10/12/2008)   Smoking status: quit  (04/27/2009)  Lipids   Total Cholesterol: 124  (06/27/2008)   LDL: 71  (06/27/2008)   LDL Direct: Not documented   HDL: 34  (06/27/2008)   Triglycerides: 96  (06/27/2008)  Hypertension   Last Blood Pressure: 137 / 85  (04/27/2009)   Serum creatinine: 1.29  (06/27/2008)   Serum potassium 4.3  (06/27/2008)    Hypertension flowsheet reviewed?: Yes   Progress toward BP goal: At goal  Self-Management Support :   Personal Goals (by the next clinic visit) :      Personal blood pressure goal: 140/90  (04/27/2009)   Hypertension self-management support: Not documented    Hypertension self-management support not done because: Good outcomes  (04/27/2009)
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
## 2010-03-19 NOTE — Letter (Signed)
Summary: [facility]   Imported By: Sherian Rein 07/02/2009 13:35:11  _____________________________________________________________________  External Attachment:    Type:   Image     Comment:   External Document
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
## 2010-03-19 NOTE — Progress Notes (Signed)
Summary: phn msg  Phone Note Call from Patient Call back at Home Phone [phone]   Caller: Spouse-Rosalyn Summary of Call: needs to talk to nurse about Cpap - needs to talk to her. Initial call taken by: De Nurse,  March 16, 2009 10:30 AM  Follow-up for Phone Call        wife states patient continues having much problem choking during sleep. [facility] and report is not ready yet . hopefully it will be ready by Monday 0131/2010. will try to obtain report at that time and fax that and order for Cpap to [facility]. Dr. Lanier Prude notified about continued problem with choking during sleep. Follow-up by: Theresia Lo RN,  March 16, 2009 11:04 AM

## 2010-03-19 NOTE — Assessment & Plan Note (Signed)
Summary: f/u visit/bmc   Vital Signs:  Patient profile:   50 year old male Weight:      185 pounds BMI:     29.08 Temp:     98.5 degrees F oral Pulse rate:   98 / minute Pulse rhythm:   regular BP sitting:   124 / 85  (right arm) Cuff size:   large  Vitals Entered By: Loralee Pacas CMA (March 30, 2009 10:32 AM)  Nutrition Counseling: Patient's BMI is greater than 25 and therefore counseled on weight management options.  Primary Care Provider:  Lequita Asal  MD  CC:  f/u psych med changes and bp.  History of Present Illness: 50 y/o male here for f/u   bipolar depression- appeared depressed and withdrawn at last visit. restarted low dose zyprexa and fluoxetine.  also on depakote. appetite improved. denies SI/HI. continued difficulty sleeping  weight gain- previous issue with weight loss/decreased appetite. had gained 40 lbs since starting olanzapine. realized last visit pt had also been taking megace. that was stopped. pt has loss an additional patient down 4 lbs since last visit  elevated BP- improved. denies headache,  shortness of breath, peripheral edema.   Habits & Providers  Alcohol-Tobacco-Diet     Tobacco Status: quit < 6 months     Tobacco Counseling: to remain off tobacco products  Current Medications (verified): 1)  Depakote Er 500 Mg  Tb24 (Divalproex Sodium) .... Please Take Two Daily With Food.  Taking At Bedtime 2)  Lamictal 150 Mg  Tabs (Lamotrigine) .... Please Take One Pill At Approximately The Same Time Each Day. 3)  Topamax 200 Mg Tabs (Topiramate) .... One Tab By Mouth Qhs 4)  Kapidex 60 Mg Cpdr (Dexlansoprazole) .... One Daily 5)  Sucralfate 1 Gm Tabs (Sucralfate) .... One At Bedtime 6)  Nexium 40 Mg Pack (Esomeprazole Magnesium) .... One Tab By Mouth Daily 7)  Ambien Cr 12.5 Mg Cr-Tabs (Zolpidem Tartrate) .... One Tab By Mouth At Bedtime As Needed Difficulty Sleeping 8)  Zyprexa 2.5 Mg Tabs (Olanzapine) .... One Half Tab By Mouth Qhs 9)   Triamcinolone Acetonide 0.1 % Oint (Triamcinolone Acetonide) .... Apply To Affected Areas Two Times A Day As Needed For Excessively Dry Skin. Disp 90 G Container 10)  Fluoxetine Hcl 10 Mg Caps (Fluoxetine Hcl) .... One Tab By Mouth Qhs 11)  Proventil Hfa 108 (90 Base) Mcg/act Aers (Albuterol Sulfate) .... Two Puffs Q4-Q6 Hours As Needed For Shortness of Breath 12)  Ibuprofen 600 Mg Tabs (Ibuprofen) .... One Tab By Mouth Q6 Hours Scheduled For 48 Hours, Then Every 6 Hours As Needed 13)  Metformin Hcl 500 Mg Tabs (Metformin Hcl) .... One Tab By Mouth Bid  Allergies (verified): 1)  ! Morphine 2)  ! Demerol 3)  ! Dilaudid (Hydromorphone Hcl)  Social History: Smoking Status:  quit < 6 months  Physical Exam  General:  Alert, NAD; vitals reviewed. Psych:  normally interactive.  affect improved.    Impression & Recommendations:  Problem # 1:  WEIGHT GAIN, ABNORMAL (ICD-783.1) Assessment Improved  given improvement in weight issues, will continue zyprexa and metformin. monitor.   Orders: [facility]- Est  Level 4 (99214)  Problem # 2:  ELEVATED BLOOD PRESSURE WITHOUT DIAGNOSIS OF HYPERTENSION (ICD-796.2) Assessment: Improved  BP normal. monitor.  Orders: [facility]- Est  Level 4 (56213)  Problem # 3:  BIPOLAR DISORDER UNSPECIFIED (ICD-296.80) Assessment: Improved  noticeable improvement in mood since last visit. will titrate up to 20 mg of fluoxetine. continue  zyprexa 2.5 mg and other mood stabilizers  Orders: [facility]- Est  Level 4 (42595)  Patient Instructions: 1)  INCREASE FLUOXETINE TO 2 TABLETS AT BEDTIME (20 mg) until you run out, then get new prescription for 20 mg tablets 2)  Continue ZYPREXA (olanzapine) 2.5 mg tablet at BEDTIME 3)  Follow up in 4-6 weeks with Dr. Lanier Prude Prescriptions: FLUOXETINE HCL 20 MG TABS (FLUOXETINE HCL) one tab by mouth at bedtime with OLANZAPINE (ZYPREXA) for mood  #30 x 3   Entered and Authorized by:   Lequita Asal  MD   Signed by:   Lequita Asal   MD on 03/30/2009   Method used:   Electronically to        [facility] [id number] (retail)       [address] [place], [place]  [address]       Ph: [phone]       Fax: [phone]   RxID:   [id number]

## 2010-03-19 NOTE — Miscellaneous (Signed)
Summary: back brace  Clinical Lists Changes rec'd a form asking for a back brace. called him to make appt. he will see pcp next week. the form is in md box.[place] Circle RN  June 14, 2009 10:30 AM

## 2010-03-19 NOTE — Progress Notes (Signed)
Summary: needs a diffrent rx  Phone Note Call from Patient Call back at [phone]   Caller: rosailn (wife) Call For: Gregory [name] Summary of Call: patient is at pharmacy now, she was given the prescriptions tussionex and tessalon to get filled. however insurance will not pay for these meds. so needs adiffrent med Initial call taken by: Valinda Hoar,  May 11, 2009 12:51 PM  Follow-up for Phone Call        there is no alternative for tessalon perles that insurance will cover. We can give pt samples of tussicaps as an alternative for tussionex. Tussicaps 1 cap two times a day for 3-4 days. Pt can either pay for perles or not. he states the tussicaps will be more important for the cyclical cough protocol.  I called pt wife and advised of the above and also advised we have left samples at the front of tussicaps for 4 days for the pt to use. otherwise follow MR recs from OV. Carron Curie CMA  May 11, 2009 2:30 PM

## 2010-03-19 NOTE — Consult Note (Signed)
Summary: [facility]   Imported By: De Nurse 05/28/2009 15:31:17  _____________________________________________________________________  External Attachment:    Type:   Image     Comment:   External Document

## 2010-03-19 NOTE — Assessment & Plan Note (Signed)
Summary: f/up from hospital stay,tcb   Vital Signs:  Patient profile:   50 year old male Weight:      189.5 pounds Temp:     98.6 degrees F oral Pulse rate:   93 / minute Pulse rhythm:   regular BP sitting:   138 / 96  (left arm) Cuff size:   large  Vitals Entered By: Loralee Pacas CMA (March 14, 2009 1:28 PM) CC: hospital f/u chest pain, f/u bipolar, elevated BP   Primary Care Provider:  Lequita Asal  MD  CC:  hospital f/u chest pain, f/u bipolar, and elevated BP.  History of Present Illness: 50 y/o male here for f/u recent hospitalization for chest pain  chest pain- no cardiac etiology detected. told it was likely MSK. has not taken NSAIDs as directed. persists. located to left of sternum. unable to describe. worsened with touch and cough. no relationship to exertion. no N/V, diaphoresis, palpitations.  bipolar depression- appeared manic at last visit. since, has stopped olanazapine and fluoxetine. restarted depakote. pt and wife report return to depressed mood, poor appetite. denies SI/HI. continued difficulty sleeping  "choking"- pts wife reports episodes of "choking" while asleep and during day. patient previously diagnosed with sleep apnea. has repeat sleep study this evening. also has cough throughout the day. nonproductive. xray during hospitalization showed chronic bronchitic changes. no fever, chills.   weight gain- previous issue with weight loss/decreased appetite. had gained 40 lbs since starting olanzapine. patient had NOT stopped megace. patient down 5 lbs since last visit  elevated BP- improved since last visit. denies headache,  shortness of breath, peripheral edema. chest pain as above.   Habits & Providers  Alcohol-Tobacco-Diet     Tobacco Status: quit     Tobacco Counseling: to remain off tobacco products  Current Medications (verified): 1)  Depakote Er 500 Mg  Tb24 (Divalproex Sodium) .... Please Take Two Daily With Food.  Taking At Bedtime 2)   Lamictal 150 Mg  Tabs (Lamotrigine) .... Please Take One Pill At Approximately The Same Time Each Day. 3)  Topamax 200 Mg Tabs (Topiramate) .... One Tab By Mouth Qhs 4)  Kapidex 60 Mg Cpdr (Dexlansoprazole) .... One Daily 5)  Sucralfate 1 Gm Tabs (Sucralfate) .... One At Bedtime 6)  Nexium 40 Mg Pack (Esomeprazole Magnesium) .... One Tab By Mouth Daily 7)  Ambien Cr 12.5 Mg Cr-Tabs (Zolpidem Tartrate) .... One Tab By Mouth At Bedtime As Needed Difficulty Sleeping 8)  Zyprexa 2.5 Mg Tabs (Olanzapine) .... One Half Tab By Mouth Qhs 9)  Triamcinolone Acetonide 0.1 % Oint (Triamcinolone Acetonide) .... Apply To Affected Areas Two Times A Day As Needed For Excessively Dry Skin. Disp 90 G Container 10)  Fluoxetine Hcl 10 Mg Caps (Fluoxetine Hcl) .... One Tab By Mouth Qhs 11)  Proventil Hfa 108 (90 Base) Mcg/act Aers (Albuterol Sulfate) .... Two Puffs Q4-Q6 Hours As Needed For Shortness of Breath 12)  Ibuprofen 600 Mg Tabs (Ibuprofen) .... One Tab By Mouth Q6 Hours Scheduled For 48 Hours, Then Every 6 Hours As Needed 13)  Metformin Hcl 500 Mg Tabs (Metformin Hcl) .... One Tab By Mouth Bid  Allergies (verified): 1)  ! Morphine 2)  ! Demerol 3)  ! Dilaudid (Hydromorphone Hcl)  Physical Exam  General:  Alert, NAD; vitals reviewed. flat affect.  Chest Wall:  TTP to left of sternum.  Lungs:  Normal respiratory effort, chest expands symmetrically. Lungs are clear to auscultation, no crackles or wheezes. Heart:  Normal rate and  regular rhythm. S1 and S2 normal without gallop, murmur, click, rub or other extra sounds. Psych:  poor eye contact. flat affect. monotonous speech.    Impression & Recommendations:  Problem # 1:  CHEST PAIN, LEFT (ICD-786.50) Assessment New  negative w/u in hospital with negative stress test. likely MSK given reproducible nature. will treat with high dose NSAIDs for several days.   Orders: [facility]- Est  Level 4 (34742)  Problem # 2:  WEIGHT GAIN, ABNORMAL  (ICD-783.1) Assessment: Improved  ?contributions of olanzapine vs. megace. weight improved. monitor.   Orders: [facility]- Est  Level 4 (99214)  Problem # 3:  ELEVATED BLOOD PRESSURE WITHOUT DIAGNOSIS OF HYPERTENSION (ICD-796.2) Assessment: Improved  continue to monitor. no BP meds for now.   Orders: [facility]- Est  Level 4 (59563)  Problem # 4:  OBSTRUCTIVE SLEEP APNEA (ICD-327.23) Assessment: Deteriorated  sleep study today. for continued cough, will try albuterol inhaler.   Orders: [facility]- Est  Level 4 (99214)  Problem # 5:  BIPOLAR DISORDER UNSPECIFIED (ICD-296.80) Assessment: Deteriorated  patient appears to be back depressed. given confounding variable of megace, discussed short repeat trial of olanzapine+ fluoxetine at lower dosages. will also start metformin to help with metabolic effects of olanazapine. f/u in 2-3 weeks to reassess. continue to work towards psych appt.   Orders: [facility]- Est  Level 4 (87564)  Patient Instructions: 1)  STOP Megace!!!!!!!!!!!! 2)  Take Ibuprofen every 6 hours for 2 days, then every 6 hours as needed 3)  Use albuterol every 4-6 hours for shortness of breath/coughing spells 4)  Follow up with Dr. Lanier Prude in 2 WEEKS!!!!!! 5)  START FLUOXETINE 10 mg at bedtime (IF YOU HAVE THE OLD PRESCRIPTION, CUT THE PILLS IN HALF) 6)  START ZYPREXA 2.5 mg ONE HALF TAB at bedtime 7)  START METFORMIN 500 mg TWICE A DAY Prescriptions: METFORMIN HCL 500 MG TABS (METFORMIN HCL) one tab by mouth bid  #60 x 0   Entered and Authorized by:   Lequita Asal  MD   Signed by:   Lequita Asal  MD on 03/14/2009   Method used:   Electronically to        [facility] [id number] (retail)       [address] [place], [place]  [address]       Ph: [phone]       Fax: [phone]   RxID:   [id number] ZYPREXA 2.5 MG TABS (OLANZAPINE) one half tab by mouth qhs  #16 x 0   Entered and Authorized by:   Lequita Asal  MD   Signed by:    Lequita Asal  MD on 03/14/2009   Method used:   Electronically to        [facility] [id number] (retail)       [address] [place], [place]  [address]       Ph: [phone]       Fax: [phone]   RxID:   [id number] IBUPROFEN 600 MG TABS (IBUPROFEN) one tab by mouth q6 hours scheduled for 48 hours, then every 6 hours as needed  #90 x 0   Entered and Authorized by:   Lequita Asal  MD   Signed by:   Lequita Asal  MD on 03/14/2009   Method used:   Electronically to  [facility] [id number] (retail)       [address] [place], [place]  [address]       Ph: [phone]       Fax: [phone]   RxID:   [id number] PROVENTIL HFA 108 (90 BASE) MCG/ACT AERS (ALBUTEROL SULFATE) two puffs q4-q6 hours as needed for shortness of breath  #1 x 1   Entered and Authorized by:   Lequita Asal  MD   Signed by:   Lequita Asal  MD on 03/14/2009   Method used:   Electronically to        [facility] [id number] (retail)       [address] [place], [place]  [address]       Ph: [phone]       Fax: [phone]   RxID:   [id number]

## 2010-03-19 NOTE — Progress Notes (Signed)
Summary: Rx Req  Phone Note Call from Patient Call back at [phone]   Caller: spouse-Roslyn Summary of Call: Would like to know how to go about getting a C-Pap machine.  Has had a sleep study before.  Keeps choking in his sleep. Initial call taken by: Clydell Hakim,  March 07, 2009 8:50 AM  Follow-up for Phone Call        spoke with wife and she states patient needs new Cpap machine. his current one is missing hoses ,etc  and he has not been using it for the past 8 months.  wife states he seems to be choking in his sleep . wife thought he had a sleep study about a year ago. called the Sleep [facility] and  patient never went for the study ordered in April of 2009.  he did have a sleep study in 2005. requested this record and will place in MD box .  called [facility] and was first  told that a new machine can be ordered based on that study. However since patient has not been using it for 8 months Medicare will require a new sleep study. will  send message to MD.. 2005 Sleep Study placed in MD box. Follow-up by: Theresia Lo RN,  March 07, 2009 9:36 AM  Additional Follow-up for Phone Call Additional follow up Details #1::        order in for new sleep study. please find me in clinic or place form to sign in my box Additional Follow-up by: Lequita Asal  MD,  March 07, 2009 11:46 AM    Additional Follow-up for Phone Call Additional follow up Details #2::    contacted [facility] again about exactly what patient will need in form of a sleep study and was told now that actually he will niot need sleep study. we will  send  over records of last office visit and  visit prior to the sleep study that was ordered in 05/2007 that patient did not get and new order for cpap machine. Follow-up by: Theresia Lo RN,  March 07, 2009 2:23 PM

## 2010-03-19 NOTE — Progress Notes (Signed)
  Phone Note Call from Patient   Caller: Patient Summary of Call: pt calling reporting pain in his right chest with deep breathing. No nausea, vomting, diaphoresis. Pt recently admitted by Dr. Myrtis Ser with negative w/u for MI with negative Lexiscan myoview. I advised the patient that if he was concerned it would be reasonable to come to the ED for evaluation. Alternatively, he could wait until the morning and call for a work-in appointment in our clinic. Initial call taken by: Myrtie Soman  MD,  April 02, 2009 11:33 PM

## 2010-03-19 NOTE — Progress Notes (Signed)
Summary: Gregory [name]  Phone Note Call from Patient Call back at [phone]   Caller: spouse-Roslyn  Summary of Call: checking on the referral of C-Pap. Initial call taken by: Clydell Hakim,  March 12, 2009 8:41 AM  Follow-up for Phone Call        explaind to wife that rx and all information has been sent to [facility] and for them to call [facility]. Follow-up by: Theresia Lo RN,  March 12, 2009 8:49 AM
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
## 2010-03-19 NOTE — Letter (Signed)
Summary: [facility]   Imported By: Sherian Rein 06/04/2009 14:18:43  _____________________________________________________________________  External Attachment:    Type:   Image     Comment:   External Document
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
## 2010-03-19 NOTE — Miscellaneous (Signed)
Summary: CPAP order  Clinical Lists Changes  Orders: Added new Referral order of Home Health Referral [facility]) - Signed

## 2010-03-19 NOTE — Assessment & Plan Note (Signed)
Summary: f/u,df   Allergies: 1)  ! Morphine 2)  ! Demerol 3)  ! Dilaudid (Hydromorphone Hcl)   Complete Medication List: 1)  Dexilant 60 Mg Cpdr (Dexlansoprazole) .... Take  one 30-60 min before first meal of the day 2)  Fluoxetine Hcl 20 Mg Tabs (Fluoxetine hcl) .Marland Kitchen.. 1 tab by mouth daily 3)  Reglan 10 Mg Tabs (Metoclopramide hcl) .... One at bedtime 4)  Bystolic 5 Mg Tabs (Nebivolol hcl) .... One tablet daily 5)  Pepcid 20 Mg Tabs (Famotidine) .Marland Kitchen.. 1 at bedtime 6)  Topamax 200 Mg Tabs (Topiramate) .... One tab by mouth at bedtime as needed for migraine 7)  Tramadol Hcl 50 Mg Tabs (Tramadol hcl) .... One to two by mouth every 4-6 hours as needed 8)  Ambien Cr 12.5 Mg Cr-tabs (Zolpidem tartrate) .... One tab by mouth 30 minutes before bedtime as needed for trouble sleeping  Other Orders: No Charge Patient Arrived (NCPA0) (NCPA0)

## 2010-03-19 NOTE — Assessment & Plan Note (Signed)
Summary: Ambien refill  Prescriptions: AMBIEN CR 12.5 MG CR-TABS (ZOLPIDEM TARTRATE) one tab by mouth 30 minutes before bedtime as needed for trouble sleeping  #31 x 5   Entered and Authorized by:   Jamie Brookes MD   Signed by:   Jamie Brookes MD on 11/27/2009   Method used:   Historical   RxID:   [id number]  [facility]ed to [facility]. Jamie Brookes MD  November 27, 2009 5:50 PM

## 2010-03-19 NOTE — Miscellaneous (Signed)
Summary: CPAP  Clinical Lists Changes rec'd fax frpm [facility]. pt has given up the CPAP voluntarily. letters to pcp for review.Golden Circle RN  May 14, 2009 11:17 AM  patient with known OSA. will address at visit.  Lequita Asal  MD  May 14, 2009 11:19 AM

## 2010-03-19 NOTE — Progress Notes (Signed)
Summary: appointment - Pt back in hospital  Phone Note Call from Patient Call back at [phone]   Caller: Spouse/rosalyn Call For: Sahasra Belue Summary of Call: Has an appt with MR on 3/25 (new pt) wants to be seen today,coughing,choking, gurgling, no fever, please advise. Initial call taken by: Darletta Moll,  May 02, 2009 2:26 PM  Follow-up for Phone Call        pt discharged from hosp 04-12-09, upcoming appt 3-25 w/ MR.  called spoke with pt's wife, rosalyn.  per wife pt had impedance manometer at [facility] 04-24-09 and since then has had bad coughing spells that cause "excruciating pain" in pt's abdomen and chest, produces thick bubbly foaming white sputum and pt coughs so hard that he begins choking/gagging.  please advise, thanks!  ALLERGIES: demeral, dilaudid, morphine.  [facility] [place] Master CNA  May 02, 2009 2:55 PM   Additional Follow-up for Phone Call Additional follow up Details #1::        it appears gi related complication. He should call [facility] or Dr. Luisa Hart HUng his local GI doc and ensure no complication from procedure furst. Also, is he going to speec therapy yet? Additional Follow-up by: Kalman Shan MD,  May 02, 2009 3:24 PM    Additional Follow-up for Phone Call Additional follow up Details #2::    Spoke with pt's spouse and advised of the above recs per MR.  She verbalized understanding and will speak with Dr Elnoria Howard to ensure no GI complications.  She states that he has not started speech therapy- still waiting to hear back from them to sched appt.  Follow-up by: Vernie Murders,  May 02, 2009 3:32 PM  Additional Follow-up for Phone Call Additional follow up Details #3:: Details for Additional Follow-up Action Taken: ok. thanks. he should keep scheduled appt with me.   called [facility] - informed her pt needs to keep scheduled appt with MR.  She verbalized understanding and would like MR to know pt is back in the hospital.  Pt at [facility] in [facility] [address]  was admited yesterday. Will forard to MR as FYI.  Gweneth Dimitri RN  May 04, 2009 11:56 AM  Additional Follow-up by: Kalman Shan MD,  May 04, 2009 11:50 AM   Appended Document: appointment - Pt back in hospital They should call pulmonary for consult if they need one

## 2010-03-19 NOTE — Assessment & Plan Note (Signed)
Summary: Rt ear irritation, appetite loss, bipolar, insomnia   Vital Signs:  Patient profile:   50 year old male Height:      67 inches Weight:      167.5 pounds BMI:     26.33 Pulse rate:   60 / minute BP sitting:   118 / 80  (right arm)  Vitals Entered By: Arlyss Repress CMA, (November 06, 2009 9:00 AM) CC: meet new doctor. refill meds. check right ear. no pain, just swelling. Is Patient Diabetic? No Pain Assessment Patient in pain? no        Primary Care Provider:  Jamie Brookes MD  CC:  meet new doctor. refill meds. check right ear. no pain and just swelling.Marland Kitchen  History of Present Illness: Accompanied by his wife who is also my patiet.   Rt Ear fullness: Pt has been itching his ear with a bobby pin. He says that it itches all the time and he has to relieve it. Pt does not have ear pain. No drainage.   Appetite loss: Pt says that he has no appetite and has lost weight from where he wants to be. His goal weight is 175 but he has not been able to eat lately. He wants something to help with his appetite. He apperas to have lost about 7 lbs since last seen in our office. His BMI is still above normal, however patient is concerned.   /Bipolar D/o and anxiety: Pt is very anxious around new people and situations. He is nervious about being here today. He has been on several psych meds in the past and says that he did best on Fluoxetine and Zyprexa. His wife confirms this.   Insomnia: Pt can not sleep well at night. He has been on the Ambien in the past and says that it works the best for him.      Habits & Providers  Alcohol-Tobacco-Diet     Tobacco Status: quit > 6 months  Current Medications (verified): 1)  Dexilant 60 Mg Cpdr (Dexlansoprazole) .... Take  One 30-60 Min Before First Meal of The Day 2)  Fluoxetine Hcl 20 Mg Tabs (Fluoxetine Hcl) .Marland Kitchen.. 1 Tab By Mouth Daily 3)  Topamax 200 Mg Tabs (Topiramate) .... One Tab By Mouth At Bedtime As Needed For Migraine 4)   Tramadol Hcl 50 Mg  Tabs (Tramadol Hcl) .... One To Two By Mouth Every 4-6 Hours As Needed 5)  Ambien Cr 12.5 Mg Cr-Tabs (Zolpidem Tartrate) .... One Tab By Mouth 30 Minutes Before Bedtime As Needed For Trouble Sleeping 6)  Zyprexa 2.5 Mg Tabs (Olanzapine) .... Tgake 1/2 Pill Every Night For Mood 7)  Epipen 2-Pak 0.3 Mg/0.55ml Devi (Epinephrine) .... Take If You Have An Allergic Reaction, Call Md or Come To Ed If You Have To Use Pen  Allergies: 1)  ! Morphine 2)  ! Demerol 3)  ! Dilaudid (Hydromorphone Hcl)  Social History: Smoking Status:  quit > 6 months  Review of Systems        vitals reviewed and pertinent negatives and positives seen in HPI   Physical Exam  General:  Well-developed,well-nourished,in no acute distress; alert,appropriate and cooperative throughout examination Ears:  Rt ear is irritated but no signs of infection Nose:  External nasal examination shows no deformity or inflammation. Nasal mucosa are pink and moist without lesions or exudates. Mouth:  Oral mucosa and oropharynx without lesions or exudates.  Teeth in good repair. Lungs:  Normal respiratory effort, chest expands symmetrically. Lungs are  clear to auscultation, no crackles or wheezes. Heart:  Normal rate and regular rhythm. S1 and S2 normal without gallop, murmur, click, rub or other extra sounds. Abdomen:  Bowel sounds positive,abdomen soft and non-tender without masses, organomegaly or hernias noted. Psych:  poor eye contact, lets his wife speak for him, flat affect.    Impression & Recommendations:  Problem # 1:  OTHER DISORDERS OF EAR (ICD-388.8) Assessment New Pt scratches his ear with a bobby pin and has irritated the ear canal. Advised to NOT put anything in his ear so that it can heal. Pt could put some OTC benadryl or cortisone cream in his ear to decrease the itching and irritation, but I cautioned about putting anything in his ear to itch.   Orders: [facility]- Est  Level 4 (16109)  Problem # 2:   WEIGHT LOSS, ABNORMAL (ICD-783.21) Assessment: Unchanged Pt is concerned about his lack of appetite. He is still in the "overweight" BMI catagory. Says he likes himself at 175 best. Encouraged to eat more.   Orders: CBC w/Diff-[facility] (85025) [facility]- Est  Level 4 (60454)  Problem # 3:  BIPOLAR DISORDER UNSPECIFIED (ICD-296.80) Assessment: Unchanged Pt has been on several combinations of meds and says that the best for him has been FLuoxetine and Zyprexa. Plan to restart zyprexa and monitor his improvement.   Orders: [facility]- Est  Level 4 (09811)  Problem # 4:  INSOMNIA NOS (ICD-780.52) Assessment: Unchanged PT says that Ambien works best for him. Rx for [place] given.   His updated medication list for this problem includes:    Ambien Cr 12.5 Mg Cr-tabs (Zolpidem tartrate) ..... One tab by mouth 30 minutes before bedtime as needed for trouble sleeping  Orders: [facility]- Est  Level 4 (99214)  Complete Medication List: 1)  Dexilant 60 Mg Cpdr (Dexlansoprazole) .... Take  one 30-60 min before first meal of the day 2)  Fluoxetine Hcl 20 Mg Tabs (Fluoxetine hcl) .Marland Kitchen.. 1 tab by mouth daily 3)  Topamax 200 Mg Tabs (Topiramate) .... One tab by mouth at bedtime as needed for migraine 4)  Tramadol Hcl 50 Mg Tabs (Tramadol hcl) .... One to two by mouth every 4-6 hours as needed 5)  Ambien Cr 12.5 Mg Cr-tabs (Zolpidem tartrate) .... One tab by mouth 30 minutes before bedtime as needed for trouble sleeping 6)  Zyprexa 2.5 Mg Tabs (Olanzapine) .... Tgake 1/2 pill every night for mood 7)  Epipen 2-pak 0.3 Mg/0.60ml Devi (Epinephrine) .... Take if you have an allergic reaction, call md or come to ed if you have to use pen  Patient Instructions: 1)  Use alcohol and vinager mixure to put in ears, no instruments in ears.  2)  Restart Zyprexa and fluoxetine. 3)  We are getting bloodwork done today.  Prescriptions: EPIPEN 2-PAK 0.3 MG/0.3ML DEVI (EPINEPHRINE) take if you have an allergic reaction, call MD or come to  ED if you have to use pen  #1 x 1   Entered and Authorized by:   Jamie Brookes MD   Signed by:   Jamie Brookes MD on 11/06/2009   Method used:   Electronically to        [facility] [phone]* (retail)       [address]       [place], [place]  [address]       Ph: [phone] or [phone]       Fax: [phone]   RxID:  [id number] ZYPREXA 2.5 MG TABS (OLANZAPINE) tgake 1/2 pill every night for mood  #16 x 3   Entered and Authorized by:   Jamie Brookes MD   Signed by:   Jamie Brookes MD on 11/06/2009   Method used:   [facility]        [facility]  [address] [phone]* (retail)       [address]       [place], [place]  [address]       Ph: [phone] or [phone]       Fax: [phone]   RxID:   [id number] FLUOXETINE HCL 20 MG TABS (FLUOXETINE HCL) 1 tab by mouth daily  #31 x 6   Entered and Authorized by:   Jamie Brookes MD   Signed by:   Jamie Brookes MD on 11/06/2009   Method used:   [facility]        [facility]  [address] [phone]* (retail)       [address]       [place], [place]  [address]       Ph: [phone] or [phone]       Fax: [phone]   RxID:   [id number] TRAMADOL HCL 50 MG  TABS (TRAMADOL HCL) One to two by mouth every 4-6 hours as needed  #40 x 1   Entered and Authorized by:   Jamie Brookes MD   Signed by:   Jamie Brookes MD on 11/06/2009   Method used:   [facility]ronically to        [facility] [phone]* (retail)       [address]       [place], [place]  [address]       Ph: [phone] or [phone]       Fax: [phone]   RxID:   [id number] DEXILANT 60 MG CPDR (DEXLANSOPRAZOLE) Take  one 30-60 min before first meal of the day  #90 x 3   Entered and Authorized by:   Jamie Brookes MD   Signed by:   Jamie Brookes MD on 11/06/2009   Method used:   [facility]        [facility]   [address] [phone]* (retail)       [address]       [place], [place]  [address]       Ph: [phone] or [phone]       Fax: [phone]   RxID:   [id number] AMBIEN CR 12.5 MG CR-TABS (ZOLPIDEM TARTRATE) one tab by mouth 30 minutes before bedtime as needed for trouble sleeping  #31 x 0   Entered and Authorized by:   Jamie Brookes MD   Signed by:   Jamie Brookes MD on 11/06/2009   Method used:   Handwritten   RxID:   [id number]

## 2010-03-19 NOTE — Progress Notes (Signed)
Summary: phn msg  Phone Note Call from Patient Call back at [phone]   Caller: spouse-Rosilyn Summary of Call: needs to talk to nurse about [facility] - Cpap she is there to pick up machine and they do not have anything on him, Initial call taken by: De Nurse,  March 12, 2009 10:05 AM  Follow-up for Phone Call        called [facility] this AM and was told that patient will need to have another sleep study. ( RN had been told last week that they could supply CPAP based on study from 5 years ago. ) [facility] but they are going to try to get him  in ASAP. patient notified. Follow-up by: Theresia Lo RN,  March 12, 2009 3:04 PM
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
## 2010-03-19 NOTE — Progress Notes (Signed)
Summary: do not take tussicaps  Phone Note Outgoing Call   Call placed by: Carron Curie CMA,  May 11, 2009 5:28 PM Call placed to: pt wife Summary of Call: Per MR advise pt to not take tussicap samples due to his current meds he is on and allergies to morphine, dilaudid. i called pt wife and advised of this. She will bring samples back on Monday. MR advised pt to just take neurotin as prescribed and to not talk for 3 days. Pt wife will schedule f/u with MW on monday when she drops off samples.  Initial call taken by: Carron Curie CMA,  May 11, 2009 5:31 PM

## 2010-03-19 NOTE — Letter (Signed)
Summary: [facility]  [facility]   Imported By: Sherian Rein 06/12/2009 11:22:08  _____________________________________________________________________  External Attachment:    Type:   Image     Comment:   External Document

## 2010-03-19 NOTE — Assessment & Plan Note (Signed)
Summary: meds/eo   Vital Signs:  Patient profile:   50 year old male Weight:      194.7 pounds BMI:     30.60 Temp:     98.3 degrees F oral Pulse rate:   105 / minute Pulse rhythm:   regular BP sitting:   183 / 119  (left arm) Cuff size:   large  Vitals Entered By: Loralee Pacas CMA (February 27, 2009 11:16 AM)  Nutrition Counseling: Patient's BMI is greater than 25 and therefore counseled on weight management options.  Serial Vital Signs/Assessments:  Time      Position  BP       Pulse  Resp  Temp     By                     185/110                        Lequita Asal  MD  CC: f/u med changes for bipolar d/o, insomnia, wt gain Comments pt states he's not sleeping through the night   Primary Care Provider:  Lequita Asal  MD  CC:  f/u med changes for bipolar d/o, insomnia, and wt gain.  History of Present Illness: 50 y/o male here for f/u   bipolar depression- never followed up with psychiatry. missed f/u appt at [facility] weaned himself off of Depakote 3-4 days after last visit in November. longstanding depression/bipolar depression with several  med trials that patient and wife state ineffective, including current depakote and lamictal. denies SI/HI. currently with elevated mood, difficulty sleeping, pressured speech, inappropriate humor, irritability.   difficulty sleeping- reports no relief with ambien. has tried sedating antihistamines in the past without relief. difficulty staying asleep.   weight gain- previous issue with weight loss/decreased appetite. now has gained 40 lbs since starting olanzapine.   elevated BP- noted today. no prior history. endorses increased salt intake. denies headache, chest pain, shortness of breath, peripheral edema  Habits & Providers  Alcohol-Tobacco-Diet     Tobacco Status: quit     Tobacco Counseling: to remain off tobacco products  Current Medications (verified): 1)  Depakote Er 500 Mg  Tb24 (Divalproex Sodium) .... Please Take  Two Daily With Food.  Taking At Bedtime 2)  Lamictal 150 Mg  Tabs (Lamotrigine) .... Please Take One Pill At Approximately The Same Time Each Day. 3)  Megace Oral 40 Mg/ml Susp (Megestrol Acetate) .... Take 800 Mg (20ml) By Mouth Daily. Dispense 480 Ml 4)  Topamax 200 Mg Tabs (Topiramate) .... One Tab By Mouth Qhs 5)  Kapidex 60 Mg Cpdr (Dexlansoprazole) .... One Daily 6)  Diazepam 5 Mg Tabs (Diazepam) .... One At Bedtime 7)  Sucralfate 1 Gm Tabs (Sucralfate) .... One At Bedtime 8)  Tramadol Hcl 50 Mg Tabs (Tramadol Hcl) .Marland Kitchen.. 1-2 At Bedtime 9)  Chantix Starting Month Pak 0.5 Mg X 11 & 1 Mg X 42 Tabs (Varenicline Tartrate) .... As Directed. 10)  Chantix 1 Mg Tabs (Varenicline Tartrate) .Marland Kitchen.. 1 Twice Daily With Food 11)  Nexium 40 Mg Pack (Esomeprazole Magnesium) .... One Tab By Mouth Daily 12)  Sumatriptan Succinate 50 Mg Tabs (Sumatriptan Succinate) .... One Tab By Mouth Q2 Hours As Needed For Headache. Do Not Exceed 4 Tablets in 24 Hours. 13)  Ambien Cr 12.5 Mg Cr-Tabs (Zolpidem Tartrate) .... One Tab By Mouth At Bedtime As Needed Difficulty Sleeping 14)  Zyprexa 5 Mg Tabs (Olanzapine) .... One Tab By  Mouth At Bedtime With Fluoxetine 15)  Triamcinolone Acetonide 0.1 % Oint (Triamcinolone Acetonide) .... Apply To Affected Areas Two Times A Day As Needed For Excessively Dry Skin. Disp 90 G Container 16)  Fluoxetine Hcl 20 Mg Caps (Fluoxetine Hcl) .... One Tab By Mouth At Bedtime With Olanzapine  Allergies (verified): 1)  ! Morphine 2)  ! Demerol 3)  ! Dilaudid (Hydromorphone Hcl)  Social History: Smoking Status:  quit  Physical Exam  General:  Alert, NAD; vitals reviewed.  Abdomen:  increased abdominal girth Psych:  Oriented X3. loud pressured speech. easily agitated.    Impression & Recommendations:  Problem # 1:  BIPOLAR DISORDER UNSPECIFIED (ICD-296.80) Assessment Deteriorated appears to be in the midst of manic episode with pressured speech, insomnia, etc. Patient very opposed  to stopping zyprexa and prozac because he states he feels better and his appetite is back. explained that the zyprexa is likely what has caused the weight gain and that the goal was to try to get his mood even, not elevated. patient reports he is going to continue to take medication. I informed him that I will not provide any refills. he is to return in 4 weeks. if weight stable, then we will discuss whether or not I will continue to prescribe the zyprexa. patient also encouraged to resume depakote. will refer to psych  Orders: Psychiatric Referral (Psych) [facility]- Est  Level 4 (04540)  Problem # 2:  ELEVATED BLOOD PRESSURE WITHOUT DIAGNOSIS OF HYPERTENSION (ICD-796.2) Assessment: New  no prior history. patient very agitated even during repeat. to check at home 2-3 x weekly and bring recordings to next visit. no intervention for now. encouraged decrease salt intake, increased intake of green leafy vegetables.   Orders: [facility]- Est  Level 4 (98119)  Problem # 3:  WEIGHT GAIN, ABNORMAL (ICD-783.1) Assessment: New  likely due to zyprexa. patient told to stop medication, but refused. states he can manage his weight. patient to return in 4 weeks for weight and BP check .  Orders: [facility]- Est  Level 4 (14782)  Problem # 4:  INSOMNIA NOS (ICD-780.52) Assessment: Deteriorated now likely due to mania. no changes for now.   His updated medication list for this problem includes:    Ambien Cr 12.5 Mg Cr-tabs (Zolpidem tartrate) ..... One tab by mouth at bedtime as needed difficulty sleeping

## 2010-03-19 NOTE — Miscellaneous (Signed)
Summary: CPAP settings  Type: Elite S9 Unit#: [id number] Pressure: 8 cm H2O Mask: Quattro Large

## 2010-03-19 NOTE — Consult Note (Signed)
Summary: [facility]   Imported By: Bradly Bienenstock 05/01/2009 14:14:11  _____________________________________________________________________  External Attachment:    Type:   Image     Comment:   External Document

## 2010-03-19 NOTE — Progress Notes (Signed)
Summary: phone note  Phone Note Outgoing Call   Call placed by: Loralee Pacas CMA,  November 07, 2009 2:39 PM Summary of Call: called to inform pt of normal lab  results  Follow-up for Phone Call        wife called back and message was given Follow-up by: De Nurse,  November 07, 2009 4:14 PM

## 2010-03-19 NOTE — Progress Notes (Signed)
  Phone Note Call from Patient   Caller: Spouse Summary of Call: Pt is coughing and has had episodes of emesis.  He typically has 10 episodes a day.  He is suppose to be seen by a GI specialist.  I instructed her that she could bring him to the ER for evaluation if she was concerned otherwise she could wait to see her pcp on Monday to discuss the symptoms since it has been going on for weeks.  She was appreciative. Initial call taken by: Marisue Ivan  MD,  April 06, 2009 10:39 PM

## 2010-03-21 NOTE — Miscellaneous (Signed)
Summary: Noshowed appt on 12/30  FYI, pts wife called at 9 am & cancelled appt on 12/30, pt too sick to come.

## 2010-03-21 NOTE — Miscellaneous (Signed)
Summary: refill Zyprexa  Clinical Lists Changes  Medications: Changed medication from ZYPREXA 2.5 MG TABS (OLANZAPINE) tgake 1/2 pill every night for mood to ZYPREXA 2.5 MG TABS (OLANZAPINE) take 1 pill every night for mood - Signed Rx of ZYPREXA 2.5 MG TABS (OLANZAPINE) take 1 pill every night for mood;  #31 x 6;  Signed;  Entered by: Jamie Brookes MD;  Authorized by: Jamie Brookes MD;  Method used: [facility]  [address] [phone]*, [address] [place], [place], [place]  [id number], Ph: [phone] or [phone], Fax: [phone]    Prescriptions: ZYPREXA 2.5 MG TABS (OLANZAPINE) take 1 pill every night for mood  #31 x 6   Entered and Authorized by:   Jamie Brookes MD   Signed by:   Jamie Brookes MD on 02/15/2010   Method used:   [facility]        [facility] [phone]* (retail)       [address]       [place], [place]  [id number]       Ph: [phone] or [phone]       Fax: [phone]   RxID:   [id number]

## 2010-04-08 ENCOUNTER — Telehealth: Payer: Self-pay | Admitting: Family Medicine

## 2010-04-08 NOTE — Telephone Encounter (Signed)
To pcp. Wife states Gregory [name] is no longer on their formulary (he has medicare) wants something that will be covered and works just as good sent to pharmacy

## 2010-04-08 NOTE — Telephone Encounter (Signed)
Rx changed to Trazedone. The pharmacists thought this one would be covered by medicare. Please let the patient know it should be ready in a couple of hours. Thanks.

## 2010-04-08 NOTE — Telephone Encounter (Signed)
Called wife and advised her of message from MD. RN had already obtained PA  form insurance company to fill out for Ambien . Will not send in at this time.   Advised wife that MD has changed to a med on the formulary  and patient  is agreeable to try new med, trazadone. Will call if not working.

## 2010-04-08 NOTE — Telephone Encounter (Signed)
Spoke with patient's wife and informed of rx change and that it will be ready in a few hours.Gregory [name], Roselyn Meier

## 2010-05-05 LAB — URINALYSIS, ROUTINE W REFLEX MICROSCOPIC
Nitrite: NEGATIVE
Specific Gravity, Urine: 1.02 (ref 1.005–1.030)
Urobilinogen, UA: 1 mg/dL (ref 0.0–1.0)

## 2010-05-05 LAB — POCT I-STAT, CHEM 8
Calcium, Ion: 1.17 mmol/L (ref 1.12–1.32)
HCT: 42 % (ref 39.0–52.0)
Hemoglobin: 14.3 g/dL (ref 13.0–17.0)
Sodium: 140 mEq/L (ref 135–145)
TCO2: 24 mmol/L (ref 0–100)

## 2010-05-05 LAB — HEPATIC FUNCTION PANEL
ALT: 38 U/L (ref 0–53)
Alkaline Phosphatase: 60 U/L (ref 39–117)
Bilirubin, Direct: <0.1 mg/dL (ref 0.0–0.3)
Total Bilirubin: 0.7 mg/dL (ref 0.3–1.2)

## 2010-05-05 LAB — CARDIAC PANEL(CRET KIN+CKTOT+MB+TROPI)
Relative Index: 0.1 (ref 0.0–2.5)
Total CK: 2145 U/L — ABNORMAL HIGH (ref 7–232)
Troponin I: 0.02 ng/mL (ref 0.00–0.06)

## 2010-05-05 LAB — CBC
HCT: 37.4 % — ABNORMAL LOW (ref 39.0–52.0)
HCT: 37.7 % — ABNORMAL LOW (ref 39.0–52.0)
MCHC: 35.5 g/dL (ref 30.0–36.0)
MCV: 93.8 fL (ref 78.0–100.0)
Platelets: 232 10*3/uL (ref 150–400)
Platelets: 241 10*3/uL (ref 150–400)
RBC: 4 MIL/uL — ABNORMAL LOW (ref 4.22–5.81)
RDW: 13.5 % (ref 11.5–15.5)
RDW: 13.6 % (ref 11.5–15.5)
WBC: 7.6 10*3/uL (ref 4.0–10.5)

## 2010-05-05 LAB — COMPREHENSIVE METABOLIC PANEL
AST: 37 U/L (ref 0–37)
Albumin: 3.4 g/dL — ABNORMAL LOW (ref 3.5–5.2)
Alkaline Phosphatase: 54 U/L (ref 39–117)
BUN: 21 mg/dL (ref 6–23)
CO2: 24 mEq/L (ref 19–32)
Chloride: 106 mEq/L (ref 96–112)
Potassium: 4.2 mEq/L (ref 3.5–5.1)
Total Bilirubin: 0.6 mg/dL (ref 0.3–1.2)

## 2010-05-05 LAB — LIPID PANEL
Triglycerides: 73 mg/dL (ref <150)
VLDL: 15 mg/dL (ref 0–40)

## 2010-05-05 LAB — PROTIME-INR
INR: 1.09 (ref 0.00–1.49)
Prothrombin Time: 14 seconds (ref 11.6–15.2)

## 2010-05-05 LAB — DIFFERENTIAL
Basophils Absolute: 0 10*3/uL (ref 0.0–0.1)
Basophils Relative: 0 % (ref 0–1)
Eosinophils Relative: 2 % (ref 0–5)
Monocytes Absolute: 1.4 10*3/uL — ABNORMAL HIGH (ref 0.1–1.0)
Neutro Abs: 2.8 10*3/uL (ref 1.7–7.7)

## 2010-05-05 LAB — POCT CARDIAC MARKERS: Myoglobin, poc: 290 ng/mL (ref 12–200)

## 2010-05-05 LAB — BRAIN NATRIURETIC PEPTIDE: Pro B Natriuretic peptide (BNP): <30 pg/mL (ref 0.0–100.0)

## 2010-05-05 LAB — HEPARIN LEVEL (UNFRACTIONATED): Heparin Unfractionated: 0.11 IU/mL — ABNORMAL LOW (ref 0.30–0.70)

## 2010-05-06 ENCOUNTER — Encounter: Payer: Self-pay | Admitting: Sports Medicine

## 2010-05-06 ENCOUNTER — Ambulatory Visit (INDEPENDENT_AMBULATORY_CARE_PROVIDER_SITE_OTHER): Payer: Medicare Other | Admitting: Sports Medicine

## 2010-05-06 ENCOUNTER — Telehealth: Payer: Self-pay | Admitting: Family Medicine

## 2010-05-06 VITALS — BP 118/78 | HR 66 | Temp 97.6°F | Ht 67.0 in | Wt 177.0 lb

## 2010-05-06 DIAGNOSIS — R05 Cough: Secondary | ICD-10-CM

## 2010-05-06 DIAGNOSIS — R059 Cough, unspecified: Secondary | ICD-10-CM

## 2010-05-06 MED ORDER — BENZONATATE 200 MG PO CAPS: 200.0000 mg | ORAL_CAPSULE | Freq: Three times a day (TID) | ORAL | Status: DC | PRN

## 2010-05-06 MED ORDER — LORATADINE 10 MG PO TABS: 10.0000 mg | ORAL_TABLET | Freq: Every day | ORAL | Status: DC

## 2010-05-06 MED ORDER — PREDNISONE 50 MG PO TABS: 50.0000 mg | ORAL_TABLET | Freq: Every day | ORAL | Status: AC

## 2010-05-06 MED ORDER — RANITIDINE HCL 300 MG PO TABS: 300.0000 mg | ORAL_TABLET | Freq: Two times a day (BID) | ORAL | Status: DC

## 2010-05-06 NOTE — Telephone Encounter (Signed)
Please see previous phone note concerning Rx.Gregory [name]

## 2010-05-06 NOTE — Telephone Encounter (Signed)
Thomas,  I called [facility] [address] and it is much cheaper for him if you would Rx the 100 mg x 60 ($14.31). Insurance does not cover this medication and he cannot afford it if you don't change it. If you like i will call in the Rx with the change i just need approval and documentation.Loralee Pacas [place]

## 2010-05-06 NOTE — Patient Instructions (Signed)
Great to meet you today, Take the below medications: Loratadine for allergies. Ranitidine 2x a day every day. Take your dexilant at dinnertime rather than bedtime. Tessalon 200mg  for coughing (as needed). Prednisone for 5 d to break up allergies. Come back to see me if no better in 1-2 weeks. -Dr. Karie Schwalbe.

## 2010-05-06 NOTE — Telephone Encounter (Signed)
Loratadine is most certainly covered (on [place] medicaid preferred list).  It is also avail OTC if they won't approve it.  Tessalon they may just have to buy, its gonna be the best at fixing his symptoms now.

## 2010-05-06 NOTE — Progress Notes (Signed)
  Subjective:    Patient ID: Gregory [name], male    DOB: [birth year], 50 y.o.   MRN: [id number]  HPI COUGH Present 1 months, dry, some sneezing associated, symptoms worse qHS, lots of throat clearing, some hoarseness, no smoking for 1.5 years, no fevers/chills/N/V/D/C, no wt loss, no night sweats, no problems drinking thin liquids.  No SOB/CP/LE swelling.   Review of Systems    See HPI Objective:   Physical Exam  Constitutional: He appears well-developed and well-nourished.  HENT:  Head: Normocephalic and atraumatic.  Right Ear: External ear normal.  Left Ear: External ear normal.  Nose: Nose normal.  Mouth/Throat: Oropharynx is clear and moist. No oropharyngeal exudate.  Eyes: Conjunctivae and EOM are normal. Pupils are equal, round, and reactive to light. Right eye exhibits no discharge. Left eye exhibits no discharge.  Neck: Normal range of motion. No JVD present. No tracheal deviation present. No thyromegaly present.  Cardiovascular: Normal rate, normal heart sounds and intact distal pulses.  Exam reveals no gallop and no friction rub.   No murmur heard. Pulmonary/Chest: Effort normal and breath sounds normal. No stridor. No respiratory distress. He has no wheezes. He has no rales. He exhibits no tenderness.  Musculoskeletal: He exhibits no edema.  Lymphadenopathy:    He has no cervical adenopathy.  Skin: Skin is warm and dry.          Assessment & Plan:

## 2010-05-06 NOTE — Telephone Encounter (Signed)
Pt was prescribed loratadine & tessalon, both are not covered by medicaid/medicare & pt needs alternate rx, pt goes to [address].

## 2010-05-06 NOTE — Assessment & Plan Note (Signed)
Symptoms suggestive of reflux and allergic component. No red flags to suggest lung or laryngeal malignancy or infectious cause. Tessalon 200 TID. Loratadine 10 qd. Short pred burst for allergies. Ranitidine 300 BID RTC if no better in 2 weeks.

## 2010-05-06 NOTE — Progress Notes (Signed)
Persistent cough gets worse at night. Was like this last year, thinks that it may be seasonal allergies.Gregory [name]

## 2010-05-07 MED ORDER — BENZONATATE 100 MG PO CAPS: ORAL_CAPSULE | ORAL | Status: DC

## 2010-05-07 NOTE — Telephone Encounter (Signed)
Pt notified.Gregory [name]  

## 2010-05-07 NOTE — Telephone Encounter (Signed)
Called it in already to [facility] [address], tessalon 100mg  TID #60. Can you let the pt know? Thanks!

## 2010-05-08 LAB — BASIC METABOLIC PANEL
BUN: 11 mg/dL (ref 6–23)
BUN: 7 mg/dL (ref 6–23)
BUN: 8 mg/dL (ref 6–23)
Calcium: 8.4 mg/dL (ref 8.4–10.5)
Chloride: 107 mEq/L (ref 96–112)
Chloride: 107 mEq/L (ref 96–112)
Creatinine, Ser: 1.13 mg/dL (ref 0.4–1.5)
Creatinine, Ser: 1.2 mg/dL (ref 0.4–1.5)
GFR calc Af Amer: >60 mL/min (ref >60)
GFR calc non Af Amer: >60 mL/min (ref >60)
GFR calc non Af Amer: >60 mL/min (ref >60)
Glucose, Bld: 93 mg/dL (ref 70–99)
Glucose, Bld: 97 mg/dL (ref 70–99)
Potassium: 3.8 mEq/L (ref 3.5–5.1)
Potassium: 3.9 mEq/L (ref 3.5–5.1)
Sodium: 140 mEq/L (ref 135–145)

## 2010-05-08 LAB — COMPREHENSIVE METABOLIC PANEL
ALT: 59 U/L — ABNORMAL HIGH (ref 0–53)
Calcium: 8.7 mg/dL (ref 8.4–10.5)
GFR calc Af Amer: >60 mL/min (ref >60)
Glucose, Bld: 102 mg/dL — ABNORMAL HIGH (ref 70–99)
Sodium: 135 mEq/L (ref 135–145)
Total Protein: 6.5 g/dL (ref 6.0–8.3)

## 2010-05-08 LAB — CBC
HCT: 34 % — ABNORMAL LOW (ref 39.0–52.0)
HCT: 34.9 % — ABNORMAL LOW (ref 39.0–52.0)
Hemoglobin: 12.9 g/dL — ABNORMAL LOW (ref 13.0–17.0)
MCHC: 35 g/dL (ref 30.0–36.0)
MCHC: 35.2 g/dL (ref 30.0–36.0)
MCV: 94.1 fL (ref 78.0–100.0)
Platelets: 207 10*3/uL (ref 150–400)
Platelets: 228 10*3/uL (ref 150–400)
RDW: 13.4 % (ref 11.5–15.5)
RDW: 13.5 % (ref 11.5–15.5)
RDW: 13.6 % (ref 11.5–15.5)
WBC: 7.7 10*3/uL (ref 4.0–10.5)
WBC: 8.8 10*3/uL (ref 4.0–10.5)

## 2010-05-08 LAB — BRAIN NATRIURETIC PEPTIDE: Pro B Natriuretic peptide (BNP): 40 pg/mL (ref 0.0–100.0)

## 2010-05-10 ENCOUNTER — Encounter: Payer: Self-pay | Admitting: Family Medicine

## 2010-05-10 LAB — CBC
HCT: 33.6 % — ABNORMAL LOW (ref 39.0–52.0)
HCT: 33.7 % — ABNORMAL LOW (ref 39.0–52.0)
HCT: 33.8 % — ABNORMAL LOW (ref 39.0–52.0)
HCT: 37.5 % — ABNORMAL LOW (ref 39.0–52.0)
Hemoglobin: 11.4 g/dL — ABNORMAL LOW (ref 13.0–17.0)
Hemoglobin: 11.7 g/dL — ABNORMAL LOW (ref 13.0–17.0)
Hemoglobin: 11.8 g/dL — ABNORMAL LOW (ref 13.0–17.0)
Hemoglobin: 13.3 g/dL (ref 13.0–17.0)
MCHC: 34 g/dL (ref 30.0–36.0)
MCHC: 34.7 g/dL (ref 30.0–36.0)
MCHC: 34.9 g/dL (ref 30.0–36.0)
MCHC: 35.5 g/dL (ref 30.0–36.0)
MCV: 93.3 fL (ref 78.0–100.0)
MCV: 93.5 fL (ref 78.0–100.0)
MCV: 93.7 fL (ref 78.0–100.0)
MCV: 95.5 fL (ref 78.0–100.0)
Platelets: 188 10*3/uL (ref 150–400)
Platelets: 202 10*3/uL (ref 150–400)
Platelets: 205 10*3/uL (ref 150–400)
Platelets: 239 10*3/uL (ref 150–400)
RBC: 3.52 MIL/uL — ABNORMAL LOW (ref 4.22–5.81)
RBC: 3.61 MIL/uL — ABNORMAL LOW (ref 4.22–5.81)
RBC: 4.01 MIL/uL — ABNORMAL LOW (ref 4.22–5.81)
RDW: 13.4 % (ref 11.5–15.5)
RDW: 13.4 % (ref 11.5–15.5)
RDW: 13.5 % (ref 11.5–15.5)
RDW: 13.6 % (ref 11.5–15.5)
WBC: 6.2 10*3/uL (ref 4.0–10.5)
WBC: 6.5 10*3/uL (ref 4.0–10.5)
WBC: 7.3 10*3/uL (ref 4.0–10.5)
WBC: 7.4 10*3/uL (ref 4.0–10.5)

## 2010-05-10 LAB — BASIC METABOLIC PANEL
BUN: 6 mg/dL (ref 6–23)
CO2: 24 mEq/L (ref 19–32)
Calcium: 7.8 mg/dL — ABNORMAL LOW (ref 8.4–10.5)
Chloride: 107 mEq/L (ref 96–112)
Creatinine, Ser: 1.26 mg/dL (ref 0.4–1.5)
GFR calc Af Amer: >60 mL/min (ref >60)
GFR calc non Af Amer: >60 mL/min (ref >60)
Glucose, Bld: 82 mg/dL (ref 70–99)
Potassium: 3.8 mEq/L (ref 3.5–5.1)
Sodium: 138 mEq/L (ref 135–145)

## 2010-05-10 LAB — COMPREHENSIVE METABOLIC PANEL
ALT: 19 U/L (ref 0–53)
ALT: 22 U/L (ref 0–53)
AST: 21 U/L (ref 0–37)
AST: 21 U/L (ref 0–37)
AST: 26 U/L (ref 0–37)
Albumin: 3.2 g/dL — ABNORMAL LOW (ref 3.5–5.2)
Albumin: 3.2 g/dL — ABNORMAL LOW (ref 3.5–5.2)
Albumin: 3.8 g/dL (ref 3.5–5.2)
Alkaline Phosphatase: 58 U/L (ref 39–117)
Alkaline Phosphatase: 73 U/L (ref 39–117)
BUN: 10 mg/dL (ref 6–23)
BUN: 13 mg/dL (ref 6–23)
BUN: 3 mg/dL — ABNORMAL LOW (ref 6–23)
CO2: 26 mEq/L (ref 19–32)
CO2: 28 mEq/L (ref 19–32)
Calcium: 8.1 mg/dL — ABNORMAL LOW (ref 8.4–10.5)
Calcium: 8.7 mg/dL (ref 8.4–10.5)
Calcium: 8.7 mg/dL (ref 8.4–10.5)
Chloride: 100 mEq/L (ref 96–112)
Chloride: 106 mEq/L (ref 96–112)
Chloride: 107 mEq/L (ref 96–112)
Creatinine, Ser: 1.25 mg/dL (ref 0.4–1.5)
Creatinine, Ser: 1.26 mg/dL (ref 0.4–1.5)
Creatinine, Ser: 1.28 mg/dL (ref 0.4–1.5)
Creatinine, Ser: 1.35 mg/dL (ref 0.4–1.5)
GFR calc Af Amer: >60 mL/min (ref >60)
GFR calc Af Amer: >60 mL/min (ref >60)
GFR calc Af Amer: >60 mL/min (ref >60)
GFR calc non Af Amer: 59 mL/min — ABNORMAL LOW (ref >60)
GFR calc non Af Amer: >60 mL/min (ref >60)
Glucose, Bld: 86 mg/dL (ref 70–99)
Glucose, Bld: 87 mg/dL (ref 70–99)
Glucose, Bld: 91 mg/dL (ref 70–99)
Potassium: 4 mEq/L (ref 3.5–5.1)
Potassium: 4.2 mEq/L (ref 3.5–5.1)
Sodium: 134 mEq/L — ABNORMAL LOW (ref 135–145)
Sodium: 138 mEq/L (ref 135–145)
Sodium: 142 mEq/L (ref 135–145)
Total Bilirubin: 0.3 mg/dL (ref 0.3–1.2)
Total Bilirubin: 0.4 mg/dL (ref 0.3–1.2)
Total Bilirubin: 0.5 mg/dL (ref 0.3–1.2)
Total Protein: 5.7 g/dL — ABNORMAL LOW (ref 6.0–8.3)
Total Protein: 5.7 g/dL — ABNORMAL LOW (ref 6.0–8.3)
Total Protein: 6.8 g/dL (ref 6.0–8.3)

## 2010-05-13 ENCOUNTER — Encounter: Payer: Self-pay | Admitting: Family Medicine

## 2010-05-13 ENCOUNTER — Ambulatory Visit (INDEPENDENT_AMBULATORY_CARE_PROVIDER_SITE_OTHER): Payer: Medicare Other | Admitting: Family Medicine

## 2010-05-13 DIAGNOSIS — I10 Essential (primary) hypertension: Secondary | ICD-10-CM

## 2010-05-13 DIAGNOSIS — R3911 Hesitancy of micturition: Secondary | ICD-10-CM | POA: Insufficient documentation

## 2010-05-13 DIAGNOSIS — Z0000 Encounter for general adult medical examination without abnormal findings: Secondary | ICD-10-CM

## 2010-05-13 DIAGNOSIS — R238 Other skin changes: Secondary | ICD-10-CM

## 2010-05-13 DIAGNOSIS — L853 Xerosis cutis: Secondary | ICD-10-CM

## 2010-05-13 DIAGNOSIS — D649 Anemia, unspecified: Secondary | ICD-10-CM

## 2010-05-13 LAB — COMPREHENSIVE METABOLIC PANEL
AST: 13 U/L (ref 0–37)
Albumin: 4.3 g/dL (ref 3.5–5.2)
BUN: 17 mg/dL (ref 6–23)
Calcium: 9.6 mg/dL (ref 8.4–10.5)
Chloride: 105 mEq/L (ref 96–112)
Glucose, Bld: 72 mg/dL (ref 70–99)
Potassium: 4.5 mEq/L (ref 3.5–5.3)
Total Protein: 6.9 g/dL (ref 6.0–8.3)

## 2010-05-13 LAB — CBC
HCT: 39.1 % (ref 39.0–52.0)
MCV: 88.1 fL (ref 78.0–100.0)
RBC: 4.44 MIL/uL (ref 4.22–5.81)
WBC: 16.6 10*3/uL — ABNORMAL HIGH (ref 4.0–10.5)

## 2010-05-13 NOTE — Assessment & Plan Note (Signed)
Pt had a normal exam today. Will plan to get labs to address some of the concerns.  Plan to get a Vit D, PSA, CMP and CBC.  No meds needed changing today.

## 2010-05-13 NOTE — Progress Notes (Signed)
  Subjective:    Patient ID: Gregory [name], male    DOB: April 18, 1960, 50 y.o.   MRN: [id number]  HPI Pt comes in today for a full physical exam. He has a h/o some anemia with a Hg of 11.5 last year in the hospital and hypoalbuminemia last year with an Alb of 3.2 He comes in today with his wife who is also concerned about his dry skin. He has a h/o bipolar disorder that is treated and is well controlled. He has occasional difficulty starting his stream of urine. He does not know of a family history of prostate ca but has never been checked with a prostate exam or PSA.    Review of Systems Neg except as noted in hpi    Objective:   Physical Exam  Constitutional: He is oriented to person, place, and time. He appears well-developed and well-nourished. No distress.  HENT:  Head: Atraumatic.  Right Ear: External ear normal.  Left Ear: External ear normal.  Nose: Nose normal.  Mouth/Throat: Oropharynx is clear and moist. No oropharyngeal exudate.  Eyes: EOM are normal. Pupils are equal, round, and reactive to light. Right eye exhibits no discharge. Left eye exhibits no discharge. No scleral icterus.  Neck: Normal range of motion.  Cardiovascular: Normal rate, regular rhythm and normal heart sounds.  Exam reveals no gallop and no friction rub.   No murmur heard. Pulmonary/Chest: Effort normal and breath sounds normal. No respiratory distress. He has no wheezes. He has no rales. He exhibits no tenderness.  Abdominal: Soft. Bowel sounds are normal. He exhibits no distension and no mass. There is no tenderness. There is no rebound and no guarding.  Genitourinary: Rectum normal, prostate normal and penis normal.       No inguinal hernias bilatearlly  Musculoskeletal: Normal range of motion. He exhibits no edema and no tenderness.  Lymphadenopathy:    He has no cervical adenopathy.  Neurological: He is alert and oriented to person, place, and time.  Skin: Skin is warm and dry. No rash noted. He is  not diaphoretic. No erythema. No pallor.  Psychiatric: He has a normal mood and affect. His behavior is normal. Judgment and thought content normal.          Assessment & Plan:

## 2010-05-16 ENCOUNTER — Other Ambulatory Visit: Payer: Self-pay | Admitting: Family Medicine

## 2010-05-16 DIAGNOSIS — E559 Vitamin D deficiency, unspecified: Secondary | ICD-10-CM | POA: Insufficient documentation

## 2010-05-16 MED ORDER — ERGOCALCIFEROL 1.25 MG (50000 UT) PO CAPS: 50000.0000 [IU] | ORAL_CAPSULE | ORAL | Status: DC

## 2010-06-03 ENCOUNTER — Telehealth: Payer: Self-pay | Admitting: Family Medicine

## 2010-06-03 NOTE — Telephone Encounter (Signed)
Patient dropped off physical form to be completed, gave to Stevan Born for any clinical completion.

## 2010-06-04 NOTE — Telephone Encounter (Signed)
Given to Dr. Clotilde Dieter this am.  Told wife we would have it ready by the end of the day today.

## 2010-06-05 NOTE — Telephone Encounter (Signed)
Placed in drop off box up front.
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
## 2010-07-05 NOTE — Op Note (Signed)
Gregory [name], Gregory [name]                ACCOUNT NO.:  [id number]   MEDICAL RECORD NO.:  [id number]          PATIENT TYPE:  AMB   LOCATION:  DSC                          FACILITY:  [facility]   PHYSICIAN:  Mila Homer. Sherlean Foot, M.D. DATE OF BIRTH:  1960/04/09   DATE OF PROCEDURE:  01/14/2006  DATE OF DISCHARGE:                               OPERATIVE REPORT   SURGEON:  Mila Homer. Sherlean Foot, M.D.   ASSISTANT:  None.   ANESTHESIA:  MAC.   PREOPERATIVE DIAGNOSIS:  Right knee lateral meniscus tear and  osteoarthritis.   POSTOPERATIVE DIAGNOSIS:  Right knee lateral meniscus tear and  osteoarthritis.   PROCEDURE:  Right knee arthroscopy with partial lateral meniscectomy,  chondroplasty of the patellofemoral joint.   INDICATIONS FOR PROCEDURE:  The patient is a 50 year old black male with  mechanical symptoms, MRI evidence of a lateral meniscus tear.  Informed  consent obtained.   DESCRIPTION OF PROCEDURE:  The patient was laid supine, administered MAC  anesthesia.  The right lower extremity was prepped and draped in the  usual sterile fashion.  Inferolateral and inferomedial portals were  created with a #11 blade, blunt trocar, and cannula.  Diagnostic  arthroscopy revealed chondromalacia in the patellofemoral joint in the  far proximal superior pole of the patella.  There was just a loose  articular margin.  This was debrided with a small Great [place] shaver  through the inferior medial portal.  The rest of the patellofemoral  joint looked very very good.  Went into flexion.  The ACL and PCL  appeared normal.  The medial compartment was completely normal.  The  lateral compartment had minimal chondromalacia, but a radial tear of the  lateral meniscus.  This was debrided with a straight basket forceps,  Great White shaver, and then an Arthrex capture wand.  I then lavaged  the knee, closed with 4-0 nylon sutures, dressed with Xeroform dressing,  sponge, and sterile Webril, and Ace wrap.   COMPLICATIONS:  None.   DRAINS:  None.           ______________________________  Mila Homer. Sherlean Foot, M.D.     SDL/MEDQ  D:  01/14/2006  T:  01/14/2006  Job:  [id number]
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
## 2010-07-05 NOTE — Procedures (Signed)
[name], Gregory                ACCOUNT NO.:  [id number]   MEDICAL RECORD NO.:  [id number]          PATIENT TYPE:  OUT   LOCATION:  SLEEP CENTER                 FACILITY:  [facility]   PHYSICIAN:  Clinton D. Maple Hudson, M.D. DATE OF BIRTH:  10-Feb-1961   DATE OF STUDY:  12/19/2003                              NOCTURNAL POLYSOMNOGRAM   REFERRING PHYSICIAN:  Dr. Darlin Coco   INDICATION FOR STUDY:  Hypersomnia with sleep apnea.   EPWORTH SLEEPINESS SCORE:  19/24   BMI:  25.   WEIGHT:  120 pounds   SLEEP ARCHITECTURE:  Total sleep time with sleep efficiency 89%.  Stage I was 3%, Stage II 66%, Stages III and IV were absent, REM was 31% of  total sleep time. Sleep latency was 37 minutes, REM latency 53 minutes,  awake after sleep onset 11 minutes, arousal index 12.   RESPIRATORY DATA:  Split-study protocol.  RDI 12.9/hr indicating mild  obstructive sleep apnea/hypopnea syndrome before CAP. There were 14  obstructive apneas and 21 hypopneas before CPAP.  Events were not  positional. REM RDI was 11.6/hr. CPAP was titrated to 8 CWP, RDI 0/hr using  a medium ComfortGel Mask with heated humidifier.   OXYGEN DATA:  Moderate to loud snoring with oxygen desaturation to a nadir  of 88% before CPAP.  Mean oxygen saturation was 96% on room air, especially  after CPAP control.   CARDIAC DATA:  Normal sinus rhythm with sinus bradycardia to 46/minute.   MOVEMENT/PARASOMNIA:  67 limb jerks were recorded with 28 causing arousal or  awakening for a periodic limb movement with arousal index of 4.3/hr which is  increased.   IMPRESSION/RECOMMENDATION:  Mild obstructive sleep apnea/hypopnea syndrome,  respiratory disturbance index 12.9/hr with desaturation to 88%. CPAP  titration to 8 CWP, RDI 0/hr using a medium ComfortGel Mask with heated  humidifier.  Periodic limb movement with arousal, 4.3/hr.                                                          Clinton D. Maple Hudson, M.D.  Diplomate,  [employer] Board  CDY/MEDQ  D:  12/24/2003 10:29:20  T:  12/25/2003 08:46:10  Job:  [id number]

## 2010-08-13 ENCOUNTER — Encounter: Payer: Self-pay | Admitting: Family Medicine

## 2010-08-13 ENCOUNTER — Ambulatory Visit (INDEPENDENT_AMBULATORY_CARE_PROVIDER_SITE_OTHER): Payer: Medicare Other | Admitting: Family Medicine

## 2010-08-13 DIAGNOSIS — M7712 Lateral epicondylitis, left elbow: Secondary | ICD-10-CM

## 2010-08-13 DIAGNOSIS — M7710 Lateral epicondylitis, unspecified elbow: Secondary | ICD-10-CM

## 2010-08-13 DIAGNOSIS — R634 Abnormal weight loss: Secondary | ICD-10-CM | POA: Insufficient documentation

## 2010-08-13 MED ORDER — MEGESTROL ACETATE 625 MG/5ML PO SUSP: 625.0000 mg | Freq: Every day | ORAL | Status: DC

## 2010-08-13 MED ORDER — MELOXICAM 15 MG PO TABS: 15.0000 mg | ORAL_TABLET | Freq: Every day | ORAL | Status: DC

## 2010-08-13 NOTE — Patient Instructions (Addendum)
Use the lateral arm band while awake for the next weeks and then as needed. Ice the arm (frozen peas) 15 min 3 times a day. Alternate with heat 3 times a day.  Using something like Aspercreme would be great.  Come back when fasting from 9 pm the night before, to have cholesterol and Vit D checked.  Call to make lab appointment.  We are setting up the colonoscopy for you.

## 2010-08-13 NOTE — Assessment & Plan Note (Signed)
Pt has been playing golf and has irritated the tendon. Plan to have him use the arm strap for the next 2 weeks and then as needed.

## 2010-08-14 ENCOUNTER — Telehealth: Payer: Self-pay | Admitting: *Deleted

## 2010-08-14 NOTE — Telephone Encounter (Signed)
Spoke with patient's wife and informed of GI appointment 7/2 @ 3:15pm with Dr. Elnoria Howard. Patient has been seen by this doctor before. Faxed referral and OV notes to [phone] informed that if unable to make appointment to please call them ahead of time

## 2010-08-15 NOTE — Assessment & Plan Note (Signed)
Pt has lost another 13 lbs since I saw him last.  Plan to investigate the causes by getting a colonoscopy to start with. May need more work up in the future.  Has had normal PSA in the last 3 months.  Has not had a colonoscopy in the past at all so will order one today.  Start Megace again today. (Pt has been on this in the past).

## 2010-08-15 NOTE — Progress Notes (Signed)
Left elbow pain: Pt has had some left elbow tendon pain the last 2 months. It is worse with playing golf and he thinks that is what started it. He has pain that shoots down into the fingers but no swelling and no bruising. Heis left handed but is trying to do a lot of things with his Rt hand because of the pain. He is not taking anything different than he normaly takes for this new pain.   WEight Loss: Pt has some abnormal weight loss. He says he has no appetite. He has been steadily losing and his wife is very concerned. He has had this before and has been on Megace which did help him gain weight. He has had a recent PSA and DRE which were normal. HE has no had any blood in the stools or changes in bowel habits. CBC and CMET were essentially normal 3 months ago.   ROS: neg except as noted above with arm pain and weight loss.   PE:  Gen: thin male seated comfortably on table Ext: left lateral arm tenderness beside he lateral epicondyle, no swelling in arm or hand.

## 2010-09-21 ENCOUNTER — Other Ambulatory Visit: Payer: Self-pay | Admitting: Family Medicine

## 2010-09-23 NOTE — Telephone Encounter (Signed)
Refill request

## 2010-10-16 ENCOUNTER — Encounter: Payer: Self-pay | Admitting: Family Medicine

## 2010-10-16 ENCOUNTER — Telehealth: Payer: Self-pay | Admitting: Family Medicine

## 2010-10-16 ENCOUNTER — Ambulatory Visit (INDEPENDENT_AMBULATORY_CARE_PROVIDER_SITE_OTHER): Payer: Medicare Other | Admitting: Family Medicine

## 2010-10-16 VITALS — BP 110/73 | HR 57 | Temp 97.7°F | Wt 166.0 lb

## 2010-10-16 DIAGNOSIS — R7309 Other abnormal glucose: Secondary | ICD-10-CM

## 2010-10-16 DIAGNOSIS — S90425A Blister (nonthermal), left lesser toe(s), initial encounter: Secondary | ICD-10-CM

## 2010-10-16 DIAGNOSIS — R631 Polydipsia: Secondary | ICD-10-CM

## 2010-10-16 DIAGNOSIS — M25569 Pain in unspecified knee: Secondary | ICD-10-CM

## 2010-10-16 DIAGNOSIS — R739 Hyperglycemia, unspecified: Secondary | ICD-10-CM

## 2010-10-16 DIAGNOSIS — IMO0002 Reserved for concepts with insufficient information to code with codable children: Secondary | ICD-10-CM

## 2010-10-16 LAB — POCT GLYCOSYLATED HEMOGLOBIN (HGB A1C): Hemoglobin A1C: 5.7

## 2010-10-16 MED ORDER — TRAMADOL HCL 50 MG PO TABS: 50.0000 mg | ORAL_TABLET | ORAL | Status: DC | PRN

## 2010-10-16 NOTE — Telephone Encounter (Signed)
Needs clarification on the directions for Tramadol.

## 2010-10-16 NOTE — Patient Instructions (Signed)
It will take a couple of days of rest for the blisters to heal. Use moleskin and a bandage across the toes to help provide comfort and cushioning. Follow up with Dr. Darrick Penna for surgery recommendations regarding your knee. I will refill your Tramadol.

## 2010-10-16 NOTE — Progress Notes (Signed)
  Subjective:    Patient ID: Gregory [name], male    DOB: 14-Oct-1960, 50 y.o.   MRN: [id number]  HPI 1.  Foot pain:  Has had pain on bottoms of his feet, mainly his toes for past week or so.  Missed work due to pain (told by boss to go home) yesterday and day before.  Started new job 1 month ago, on his feet on concrete floor entire day.  New shoes for job 2 weeks ago.  Pain described as sharp and aching pain.    2.  Knee pain:  Left knee pain.  Long workup for this, told he has no cartilage left in that knee.  States he's had a portion of his femur replaced.  (On review of records, he has had ORIF of Left femur in 2003).  Has been told he requires Left knee replacement multiple times in past, trying to put this off as long as possible.  Pain described as aching, deep in Left knee.    3.  Concerned for diabetes:  Told he had "high sugars" in past.  Complains of polydipsia, not polyuria.  Also occasional paresthesias in toes.  Strong family history.  Would like to be checked for diabetes.   Review of Systems See HPI above for review of systems.       Objective:   Physical Exam Gen:  Alert, cooperative patient who appears stated age in no acute distress.  Vital signs reviewed. MSK:  TTP throughout joint line.  Strength 4/5, limited by pain.  No actual joint laxity.  No effusion, no erythema, no edema.  ROM WNL, but painful to assess.   Extr:  Blisters noted 3rd, 4th, 5th toes of Left foot.  5th toe with ruptured blister, erythema.  No purulent material noted, no cellulitis, just irritation. CV:  Diminished pulses BL DP/PT       Assessment & Plan:

## 2010-10-18 ENCOUNTER — Telehealth: Payer: Self-pay | Admitting: Family Medicine

## 2010-10-18 NOTE — Telephone Encounter (Signed)
Called home phone and left message.  Called cell as well and had to leave another message.  I called the Pharmacy to see what the problem with the instructions were, they said they did not even have the Tramadol on file.  Need to learn more from patient about what is wrong with instructions.  Await call back.

## 2010-10-19 DIAGNOSIS — S90425A Blister (nonthermal), left lesser toe(s), initial encounter: Secondary | ICD-10-CM | POA: Insufficient documentation

## 2010-10-19 NOTE — Assessment & Plan Note (Signed)
Seems to be reaching point of knee replacement.  To FU with orthopedist once he has made final decision.  He would like to FU with Dr. Darrick Penna, recommended he do this as well for any further rec's prior to replacement. Tramadol refill today for relief

## 2010-10-19 NOTE — Assessment & Plan Note (Signed)
See instructions -- recommended moleskin and rest.   Do not pop or puncture on his own.   No signs infection.   To wear his shoes outside of work as well to help break them in

## 2010-10-23 NOTE — Telephone Encounter (Signed)
Has this been addressed?

## 2010-10-25 ENCOUNTER — Ambulatory Visit: Payer: Medicare Other | Admitting: Sports Medicine

## 2010-11-03 ENCOUNTER — Other Ambulatory Visit: Payer: Self-pay | Admitting: Family Medicine

## 2010-11-03 NOTE — Telephone Encounter (Signed)
Refill request

## 2010-11-05 ENCOUNTER — Ambulatory Visit: Payer: Medicare Other | Admitting: Family Medicine

## 2010-11-08 ENCOUNTER — Ambulatory Visit (INDEPENDENT_AMBULATORY_CARE_PROVIDER_SITE_OTHER): Payer: Medicare Other | Admitting: Family Medicine

## 2010-11-08 ENCOUNTER — Encounter: Payer: Self-pay | Admitting: Family Medicine

## 2010-11-08 VITALS — BP 110/70

## 2010-11-08 DIAGNOSIS — R059 Cough, unspecified: Secondary | ICD-10-CM

## 2010-11-08 DIAGNOSIS — M25569 Pain in unspecified knee: Secondary | ICD-10-CM

## 2010-11-08 DIAGNOSIS — R05 Cough: Secondary | ICD-10-CM

## 2010-11-08 MED ORDER — BENZONATATE 200 MG PO CAPS: 200.0000 mg | ORAL_CAPSULE | Freq: Three times a day (TID) | ORAL | Status: DC | PRN

## 2010-11-08 MED ORDER — AZITHROMYCIN 250 MG PO TABS: ORAL_TABLET | ORAL | Status: AC

## 2010-11-08 NOTE — Patient Instructions (Addendum)
YOUR APPT WITH DR Despina Hick IS ON: Tuesday NOVEMBER 20TH AT 8:45AM [phone] PHONE.  WILL FAX NOTES TO [phone] ONCE ALL NOTES HAVE BEEN COMPLETED

## 2010-11-08 NOTE — Progress Notes (Signed)
  Subjective:    Patient ID: Gregory [name], male    DOB: February 08, 1961, 50 y.o.   MRN: [id number]  HPI  Worsening chronic left knee pain. Has been seen by Dr. Darrick Penna before that last visit I discussed total knee replacement and Dr. fields recommended Dr. Berton Lan. Unfortunately [name] was unable to keep that appointment. His knee pain is worsening. He would like to make that appointment now. His orthotic is also in disrepair but unfortunately he did not bring that with him today. He has not been using it the last few months. He has significant leg length discrepancy.  #2. Also wants to know how see him for head cold. He's had sinus type headache particularly behind his eyes with his teeth aching for about the last 7-10 days. No fevers that he is aware of, no sweats or chills. He has a dry nonproductive cough. That is keeping him awake at night. He has nasal congestion. Denies dizziness.  Review of Systems Denies fever, sweats, chills. No unexpected weight gain or loss.    Objective:   Physical Exam  GENERALl: Well developed, well nourished, in no acute distress. NECK: Supple, FROM, without lymphadenopathy.  THYROID: normal without nodularity HEENT: TM B normal. TTP maxillary sinuses B OP clear without exudate LUNGS: clear to auscultation bilaterally. No wheezes or rales. HEART: Regular rate and rhythm, no murmurs          Assessment & Plan:  1. Left knee pain chronic but worsening. He was to see Dr. Idelle Crouch and we have set that up. #2. Sinusitis. Has some cough associated probably from postnasal drip. I have given him Z-Pak and Tessalon Perles. He will followup arrhythmia or his PCP if this is not improving.

## 2010-11-26 ENCOUNTER — Ambulatory Visit (INDEPENDENT_AMBULATORY_CARE_PROVIDER_SITE_OTHER): Payer: Medicare Other | Admitting: Sports Medicine

## 2010-11-26 VITALS — BP 110/70

## 2010-11-26 DIAGNOSIS — M25569 Pain in unspecified knee: Secondary | ICD-10-CM

## 2010-11-26 MED ORDER — TRAMADOL HCL 50 MG PO TABS: 50.0000 mg | ORAL_TABLET | Freq: Four times a day (QID) | ORAL | Status: DC | PRN
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
## 2010-11-26 NOTE — Assessment & Plan Note (Signed)
We do not have a lot to offer other than better pain control  He goes to [facility] next month for knee replacement?  In intereim stop meloxicam 2/2 GI sxs  Increase tramadol to qid  Consider cane  Reck by [place] prn

## 2010-12-04 ENCOUNTER — Encounter: Payer: Self-pay | Admitting: Family Medicine

## 2010-12-04 ENCOUNTER — Ambulatory Visit (INDEPENDENT_AMBULATORY_CARE_PROVIDER_SITE_OTHER): Payer: Medicare Other | Admitting: Family Medicine

## 2010-12-04 DIAGNOSIS — L299 Pruritus, unspecified: Secondary | ICD-10-CM

## 2010-12-04 DIAGNOSIS — R05 Cough: Secondary | ICD-10-CM

## 2010-12-04 DIAGNOSIS — R059 Cough, unspecified: Secondary | ICD-10-CM

## 2010-12-04 DIAGNOSIS — Z23 Encounter for immunization: Secondary | ICD-10-CM

## 2010-12-04 DIAGNOSIS — E559 Vitamin D deficiency, unspecified: Secondary | ICD-10-CM

## 2010-12-04 MED ORDER — MEGESTROL ACETATE 625 MG/5ML PO SUSP: 625.0000 mg | Freq: Every day | ORAL | Status: DC

## 2010-12-04 MED ORDER — EPINEPHRINE 0.3 MG/0.3ML IJ DEVI: 0.3000 mg | Freq: Once | INTRAMUSCULAR | Status: DC

## 2010-12-04 MED ORDER — NEOMYCIN-POLYMYXIN-HC 1 % OT SOLN: 4.0000 [drp] | Freq: Four times a day (QID) | OTIC | Status: DC

## 2010-12-04 MED ORDER — TOPIRAMATE 200 MG PO TABS: 200.0000 mg | ORAL_TABLET | Freq: Every evening | ORAL | Status: DC | PRN

## 2010-12-04 MED ORDER — ERGOCALCIFEROL 1.25 MG (50000 UT) PO CAPS: 50000.0000 [IU] | ORAL_CAPSULE | ORAL | Status: DC

## 2010-12-04 MED ORDER — OLANZAPINE 2.5 MG PO TABS: 2.5000 mg | ORAL_TABLET | Freq: Every day | ORAL | Status: DC

## 2010-12-04 MED ORDER — DEXLANSOPRAZOLE 60 MG PO CPDR: 60.0000 mg | DELAYED_RELEASE_CAPSULE | Freq: Every day | ORAL | Status: DC

## 2010-12-04 MED ORDER — LORATADINE 10 MG PO TABS: 10.0000 mg | ORAL_TABLET | Freq: Every day | ORAL | Status: DC

## 2010-12-04 NOTE — Assessment & Plan Note (Signed)
Likely secondary to external otitis with allergy component or irritation from over-cleaning ear canal. -- For itching, will give Rx for cortisporin ear drops prn. -- Advised patient to only use Q-tips on external part of ear to avoid pushing wax deep into the canal. -- Ear exam was normal, no concern for otitis externa or media. -- Continue Loratadine daily for allergic rhinitis. -- Patient agreed with plan and will return to clinic if symptoms persist.
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
## 2010-12-04 NOTE — Patient Instructions (Signed)
It was nice to meet you. I will send medication refills to your pharmacy. Please schedule follow up appointment with me prior to surgery or sooner if needed.  I will send Megace and ear drops to your pharmacy.   Chronic Pain Management Managing chronic pain is not easy. The goal is to provide as much pain relief as possible. There are emotional as well as physical problems. Chronic pain may lead to symptoms of depression which magnify those of the pain. Problems may include:  Anxiety.  Sleep disturbances.   Confused thinking.   Feeling cranky.  Fatigue.   Weight gain or loss.   Identify the source of the pain first, if possible. The pain may be masking another problem. Try to find a pain management specialist or clinic. Work with a team to create a treatment plan for you. MEDICATIONS:  May include narcotics or opioids. Larger than normal doses may be needed to control your pain.   Drugs for depression may help.   Over-the-counter medicines may help for some conditions. These drugs may be used along with others for better pain relief.   May be injected into sites such as the spine and joints. Injections may have to be repeated if they wear off.  THERAPY MAY INCLUDE:  Working with a physical therapist to keep from getting stiff.   Regular, gently exercise.   Cognitive or behavioral therapy.   Using complementary or integrative medicine such as:   Acupuncture.   Massage, Reiki or Rolfing.   Aroma, color, light or sound therapy.   Group support.  FOR MORE INFORMATION: [email or web address]. American Chronic Pain Association [email or web address]. Document Released: 03/13/2004 Document Re-Released: 11/13/2007 [facility] Patient Information 2011 [place], [place].

## 2011-01-11 ENCOUNTER — Other Ambulatory Visit: Payer: Self-pay | Admitting: Orthopedic Surgery

## 2011-01-14 ENCOUNTER — Encounter: Payer: Self-pay | Admitting: Family Medicine

## 2011-01-14 ENCOUNTER — Ambulatory Visit (INDEPENDENT_AMBULATORY_CARE_PROVIDER_SITE_OTHER): Payer: Medicare Other | Admitting: Family Medicine

## 2011-01-14 VITALS — BP 138/89 | HR 60 | Temp 98.3°F | Ht 67.0 in | Wt 171.3 lb

## 2011-01-14 DIAGNOSIS — I10 Essential (primary) hypertension: Secondary | ICD-10-CM

## 2011-01-14 DIAGNOSIS — M25569 Pain in unspecified knee: Secondary | ICD-10-CM

## 2011-01-14 MED ORDER — TOPIRAMATE 200 MG PO TABS: 200.0000 mg | ORAL_TABLET | Freq: Every evening | ORAL | Status: DC | PRN

## 2011-01-14 MED ORDER — CAPSAICIN 0.025 % EX GEL: 1.0000 "application " | Freq: Two times a day (BID) | CUTANEOUS | Status: DC

## 2011-01-14 MED ORDER — TRAZODONE HCL 50 MG PO TABS: 50.0000 mg | ORAL_TABLET | Freq: Every day | ORAL | Status: DC

## 2011-01-14 NOTE — Progress Notes (Signed)
  Subjective:    Patient ID: Gregory [name], male    DOB: [birth year], 50 y.o.   MRN: [id number]  HPI  Patient presents to clinic for follow up left knee pain, medication refills, and work note.  Left knee pain:  [facility]  At that time, NSAIDS were discontinued due to GI distress and Tramadol 50 was increased to QID.  Knee surgery to remove hardware is scheduled for 03/25/10, then he will need repeat surgery for knee replacement.  Patient says Tramadol helps ease the pain.  He will return to clinic a few weeks prior to surgery for pre-op clearance.  HTN: Patient is not on any medications.  His BP was 138/89, but patient says due to stress.  He denies exercise due to chronic pain.  Denies any headache, CP, SOB, nausea/vomiting, changes in vision, or numbness/tingling of extremities.   Review of Systems  Per history of present illlness    Objective:   Physical Exam  Constitutional: No distress.  Musculoskeletal:       Left knee: He exhibits decreased range of motion, deformity and abnormal alignment. He exhibits no swelling, no effusion, no erythema and no bony tenderness. no tenderness found.  Neurological: He is alert. He has normal strength. No sensory deficit. Gait abnormal.          Assessment & Plan:

## 2011-01-14 NOTE — Assessment & Plan Note (Signed)
Appreciate Dr. Darrick Penna recommendations.  Surgery scheduled for 03/25/10. Continue Tramadol 50 mg QID PRN pain - if causes drowsiness, do not take at work. Will give Rx for Capsaicin topical cream PRN pain at work. Note given to return to work (no heavy lifting) today. Advised patient to RTC for pre-op clearance 2-4 weeks prior to surgery.

## 2011-01-14 NOTE — Assessment & Plan Note (Signed)
BP stable today 138/89.  No medications. Advised to eat low sodium diet and increase physical activity. Will repeat BP at pre-op clearance in 2 months. Will consider starting anti-hypertensive at that time.

## 2011-02-18 ENCOUNTER — Other Ambulatory Visit: Payer: Self-pay | Admitting: Family Medicine

## 2011-02-19 NOTE — Telephone Encounter (Signed)
Refill request

## 2011-02-27 ENCOUNTER — Other Ambulatory Visit: Payer: Self-pay | Admitting: Family Medicine

## 2011-02-27 NOTE — Telephone Encounter (Signed)
Refill request

## 2011-03-12 ENCOUNTER — Encounter: Payer: Self-pay | Admitting: Family Medicine

## 2011-03-12 ENCOUNTER — Ambulatory Visit (INDEPENDENT_AMBULATORY_CARE_PROVIDER_SITE_OTHER): Payer: Medicare Other | Admitting: Family Medicine

## 2011-03-12 VITALS — BP 128/85 | HR 61 | Temp 97.2°F | Ht 67.0 in | Wt 177.0 lb

## 2011-03-12 DIAGNOSIS — J988 Other specified respiratory disorders: Secondary | ICD-10-CM

## 2011-03-12 DIAGNOSIS — E559 Vitamin D deficiency, unspecified: Secondary | ICD-10-CM | POA: Diagnosis not present

## 2011-03-12 DIAGNOSIS — I10 Essential (primary) hypertension: Secondary | ICD-10-CM

## 2011-03-12 DIAGNOSIS — Z01818 Encounter for other preprocedural examination: Secondary | ICD-10-CM

## 2011-03-12 MED ORDER — AZITHROMYCIN 500 MG PO TABS: 500.0000 mg | ORAL_TABLET | Freq: Every day | ORAL | Status: AC

## 2011-03-12 NOTE — Assessment & Plan Note (Signed)
Patient is scheduled to have hardware removed and knee replacement surgery on February 6. Per PCP, patient is medically cleared for surgery. Will order CBC and comprehensive metabolic panel today.

## 2011-03-12 NOTE — Patient Instructions (Signed)
Patient saw PCP today for pre-op clearance for knee surgery. Will order CBC and CMET today. METS: 4-10.  Patient is cleared for surgery on 03/26/11. For cold symptoms, take Azithromycin 500 mg x 5 days. Drink plenty of fluids and rest.  Continue OTC and home remedies. Please call our office if you have any questions or concerns.

## 2011-03-12 NOTE — Assessment & Plan Note (Signed)
Likely secondary to upper viral respiratory infection versus sinusitis. Patient has tried over-the-counter medications with no relief. Will start azithromycin 500 mg x5 days. Recommended Flonase, cough drops.

## 2011-03-12 NOTE — Progress Notes (Signed)
  Subjective:    Patient ID: Gregory [name], male    DOB: [birth year], 51 y.o.   MRN: [id number]  HPI  Patient presents to clinic for a preop clearance and nasal congestion and cough.  Nasal congestion and cough started several days ago. He has tried taking cough drops and cough syrup with no relief. Patient is requesting a Z-Pak to treat symptoms. Associated symptoms: Decreased appetite, rhinorrhea, nasal congestion, sore throat. Denies any fevers, chills, nausea or vomiting, chest or abdominal pain.  Preop clearance: Patient is scheduled to have hardware of the knee removed on March 26, 2011.  Patient has a history of hypertension, but denies any history of stroke, MI, cardiac arrhythmia, or congestive heart failure. Patient denies renal failure or diabetes mellitus. He is not obese. Patient is able to take care of himself, eats, address, use the toilet. He is able to walk up a flight of steps without difficulty. He can also do heavy work around the house. Patient does not participate in any strenuous sports secondary to chronic bilateral knee pain.  I have reviewed patient's allergies, medications, problem list, social history, past medical history.  Review of Systems Gen.: Pleasant, in no acute distress HEENT: NCAT. PERRLA. EOMI. Nasal drainage noted. Oropharynx moist. Neck: No JVD, no cervical adenopathy, supple Heart: Distant heart sounds, regular rate and rhythm, no murmurs Lungs: Clear to auscultation bilaterally, no wheezes or rhonchi Abdomen: Soft, nondistended, nontender, active bowel sounds Musculoskeletal: No tenderness on palpation of Knees bilaterally, no cyanosis or edema, full range of motion of all extremities Neuro: No cranial nerve deficit, sensation intact, no motor deficit, walks with limp      Objective:   Physical Exam        Assessment & Plan:

## 2011-03-13 ENCOUNTER — Encounter (HOSPITAL_COMMUNITY): Payer: Self-pay

## 2011-03-13 LAB — COMPREHENSIVE METABOLIC PANEL
ALT: 18 U/L (ref 0–53)
AST: 16 U/L (ref 0–37)
CO2: 25 mEq/L (ref 19–32)
Calcium: 9.1 mg/dL (ref 8.4–10.5)
Chloride: 104 mEq/L (ref 96–112)
Creat: 1.26 mg/dL (ref 0.50–1.35)
Potassium: 4.3 mEq/L (ref 3.5–5.3)
Sodium: 138 mEq/L (ref 135–145)
Total Protein: 6.9 g/dL (ref 6.0–8.3)

## 2011-03-13 LAB — CBC
MCV: 85.9 fL (ref 78.0–100.0)
Platelets: 264 10*3/uL (ref 150–400)
RBC: 4.68 MIL/uL (ref 4.22–5.81)
RDW: 13.1 % (ref 11.5–15.5)
WBC: 7.6 10*3/uL (ref 4.0–10.5)

## 2011-03-13 LAB — VITAMIN D 25 HYDROXY (VIT D DEFICIENCY, FRACTURES): Vit D, 25-Hydroxy: 28 ng/mL — ABNORMAL LOW (ref 30–89)

## 2011-03-14 ENCOUNTER — Encounter (HOSPITAL_COMMUNITY): Payer: Self-pay

## 2011-03-14 ENCOUNTER — Other Ambulatory Visit: Payer: Self-pay

## 2011-03-14 ENCOUNTER — Encounter (HOSPITAL_COMMUNITY)
Admission: RE | Admit: 2011-03-14 | Discharge: 2011-03-14 | Disposition: A | Discharge status: Home / Self Care | Payer: Medicare Other | Source: Ambulatory Visit | Attending: Orthopedic Surgery | Admitting: Orthopedic Surgery

## 2011-03-14 ENCOUNTER — Ambulatory Visit (HOSPITAL_COMMUNITY)
Admission: RE | Admit: 2011-03-14 | Discharge: 2011-03-14 | Disposition: A | Discharge status: Home / Self Care | Payer: Medicare Other | Source: Ambulatory Visit | Attending: Orthopedic Surgery | Admitting: Orthopedic Surgery

## 2011-03-14 DIAGNOSIS — Z01818 Encounter for other preprocedural examination: Secondary | ICD-10-CM | POA: Insufficient documentation

## 2011-03-14 DIAGNOSIS — Z01811 Encounter for preprocedural respiratory examination: Secondary | ICD-10-CM | POA: Diagnosis not present

## 2011-03-14 DIAGNOSIS — Z01812 Encounter for preprocedural laboratory examination: Secondary | ICD-10-CM | POA: Diagnosis not present

## 2011-03-14 DIAGNOSIS — Z01810 Encounter for preprocedural cardiovascular examination: Secondary | ICD-10-CM | POA: Insufficient documentation

## 2011-03-14 DIAGNOSIS — I498 Other specified cardiac arrhythmias: Secondary | ICD-10-CM | POA: Insufficient documentation

## 2011-03-14 HISTORY — DX: Allergy status to unspecified drugs, medicaments and biological substances: Z88.9

## 2011-03-14 HISTORY — DX: Sleep apnea, unspecified: G47.30

## 2011-03-14 HISTORY — DX: Essential (primary) hypertension: I10

## 2011-03-14 HISTORY — DX: Headache: R51

## 2011-03-14 HISTORY — DX: Gastro-esophageal reflux disease without esophagitis: K21.9

## 2011-03-14 HISTORY — DX: Procedure and treatment not carried out because of patient's decision for reasons of belief and group pressure: Z53.1

## 2011-03-14 HISTORY — DX: Unspecified osteoarthritis, unspecified site: M19.90

## 2011-03-14 HISTORY — DX: Reserved for inherently not codable concepts without codable children: IMO0001

## 2011-03-14 NOTE — Pre-Procedure Instructions (Addendum)
PT SAW HIS MEDICAL DOCTOR 03/12/11-GIVEN MEDICAL CLEARANCE FOR KNEE SURGERY WITH DR. Lequita Halt ON 03/17/11.  COPY OF THE OFFICE NOTES ON THIS CHART.  HYPERTENSION IS LISTED AS A DIAGNOSIS--PT STATES HE HAS NEVER TAKEN ANY B/P MEDS.  PT HADCBC AND CMET DONE ON 03/12/11--RESULTS ARE AVAILABLE IN EPIC.  PT HAD SURGICAL PCR , EKG AND CXR DONE TODAY AT [facility]. PT SIGNED REFUSAL FOR BLOOD--JEHOVAH'S WITNESS.  COPY OF REFUSAL FAXED TO [facility] BLOOD BANK AND NOTE ON FRONT OF PT'S CHART TO REMIND SURGEON AND OR.

## 2011-03-14 NOTE — Patient Instructions (Signed)
20 Gregory [name]  03/14/2011   Your procedure is scheduled on:  Monday 1/28  AT 3:45 PM  Report to [facility] at 1:15  PM.  Call this number if you have problems the morning of surgery: [phone]   Remember:   Do not eat food AFTER MIDNIGHT THE NIGHT BEFORE YOUR SURGERY.  May have clear liquids TO DRINK FROM MIDNIGHT NIGHT BEFORE SURGERY --UNTIL 9:00 AM DAY OF YOUR SURGERY.  NOTHING TO DRINK AFTER 9:00 AM DAY OF SURGERY.  Clear liquids include soda, tea, black coffee, apple or grape juice, WATER.  Take these medicines the morning of surgery with A SIP OF WATER:DEXILANT AND CLARITIN IF NEEDED--MAY TAKE PAIN PILL IF NEEDED (TRAMADOL)    Do not wear jewelry.  Do not wear lotions.   Do not bring valuables to the hospital.  Contacts, dentures or bridgework may not be worn into surgery.  Leave suitcase in the car. After surgery it may be brought to your room.  For patients admitted to the hospital, checkout time is 11:00 AM the day of discharge.   Patients discharged the day of surgery will not be allowed to drive home.    Special Instructions: CHG Shower Use Special Wash: 1/2 bottle night before surgery and 1/2 bottle morning of surgery.   Please read over the following fact sheets that you were given: MRSA Information AND INCENTIVE SPIROMETER INFORMATION

## 2011-03-16 ENCOUNTER — Encounter: Payer: Self-pay | Admitting: Family Medicine
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
## 2011-03-17 ENCOUNTER — Encounter (HOSPITAL_COMMUNITY): Payer: Self-pay | Admitting: *Deleted

## 2011-03-17 ENCOUNTER — Ambulatory Visit (HOSPITAL_COMMUNITY)
Admission: RE | Admit: 2011-03-17 | Discharge: 2011-03-19 | Disposition: A | Discharge status: Home / Self Care | Payer: Medicare Other | Source: Ambulatory Visit | Attending: Orthopedic Surgery | Admitting: Orthopedic Surgery

## 2011-03-17 ENCOUNTER — Encounter (HOSPITAL_COMMUNITY): Payer: Self-pay | Admitting: Anesthesiology

## 2011-03-17 ENCOUNTER — Ambulatory Visit (HOSPITAL_COMMUNITY): Payer: Medicare Other | Admitting: Anesthesiology

## 2011-03-17 ENCOUNTER — Encounter (HOSPITAL_COMMUNITY)
Admission: RE | Disposition: A | Discharge status: Home / Self Care | Payer: Self-pay | Source: Ambulatory Visit | Attending: Orthopedic Surgery

## 2011-03-17 DIAGNOSIS — Z967 Presence of other bone and tendon implants: Secondary | ICD-10-CM

## 2011-03-17 DIAGNOSIS — K219 Gastro-esophageal reflux disease without esophagitis: Secondary | ICD-10-CM | POA: Diagnosis not present

## 2011-03-17 DIAGNOSIS — T8489XA Other specified complication of internal orthopedic prosthetic devices, implants and grafts, initial encounter: Secondary | ICD-10-CM | POA: Insufficient documentation

## 2011-03-17 DIAGNOSIS — T84099A Other mechanical complication of unspecified internal joint prosthesis, initial encounter: Secondary | ICD-10-CM | POA: Diagnosis not present

## 2011-03-17 DIAGNOSIS — Y831 Surgical operation with implant of artificial internal device as the cause of abnormal reaction of the patient, or of later complication, without mention of misadventure at the time of the procedure: Secondary | ICD-10-CM | POA: Insufficient documentation

## 2011-03-17 DIAGNOSIS — M79609 Pain in unspecified limb: Secondary | ICD-10-CM | POA: Diagnosis not present

## 2011-03-17 DIAGNOSIS — Z79899 Other long term (current) drug therapy: Secondary | ICD-10-CM | POA: Insufficient documentation

## 2011-03-17 DIAGNOSIS — L299 Pruritus, unspecified: Secondary | ICD-10-CM | POA: Diagnosis not present

## 2011-03-17 DIAGNOSIS — G4730 Sleep apnea, unspecified: Secondary | ICD-10-CM | POA: Insufficient documentation

## 2011-03-17 DIAGNOSIS — T84498A Other mechanical complication of other internal orthopedic devices, implants and grafts, initial encounter: Secondary | ICD-10-CM | POA: Diagnosis not present

## 2011-03-17 HISTORY — PX: HARDWARE REMOVAL: SHX979

## 2011-03-17 SURGERY — REMOVAL, HARDWARE
Anesthesia: General | Site: Knee | Laterality: Left | Wound class: Clean

## 2011-03-17 MED ORDER — HYDROMORPHONE HCL 2 MG PO TABS
2.0000 mg | ORAL_TABLET | ORAL | Status: DC | PRN
  Administered 2011-03-17 – 2011-03-18 (×2): 4 mg via ORAL
  Filled 2011-03-17 (×2): qty 2

## 2011-03-17 MED ORDER — CHLORHEXIDINE GLUCONATE 4 % EX LIQD: 60.0000 mL | Freq: Once | CUTANEOUS | Status: DC

## 2011-03-17 MED ORDER — LACTATED RINGERS IV SOLN
INTRAVENOUS | Status: DC
  Administered 2011-03-17: 18:00:00 via INTRAVENOUS
  Administered 2011-03-17: 1000 mL via INTRAVENOUS
  Administered 2011-03-17: 19:00:00 via INTRAVENOUS

## 2011-03-17 MED ORDER — ONDANSETRON HCL 4 MG/2ML IJ SOLN
INTRAMUSCULAR | Status: DC | PRN
  Administered 2011-03-17: 4 mg via INTRAVENOUS

## 2011-03-17 MED ORDER — METHOCARBAMOL 500 MG PO TABS
500.0000 mg | ORAL_TABLET | Freq: Four times a day (QID) | ORAL | Status: DC | PRN
  Administered 2011-03-18 (×2): 500 mg via ORAL
  Filled 2011-03-17 (×2): qty 1

## 2011-03-17 MED ORDER — ONDANSETRON HCL 4 MG/2ML IJ SOLN: 4.0000 mg | Freq: Four times a day (QID) | INTRAMUSCULAR | Status: DC | PRN

## 2011-03-17 MED ORDER — CEFAZOLIN SODIUM 1-5 GM-% IV SOLN
1.0000 g | Freq: Four times a day (QID) | INTRAVENOUS | Status: AC
  Administered 2011-03-18 (×3): 1 g via INTRAVENOUS
  Filled 2011-03-17 (×3): qty 50

## 2011-03-17 MED ORDER — CEFAZOLIN SODIUM-DEXTROSE 2-3 GM-% IV SOLR
2.0000 g | Freq: Once | INTRAVENOUS | Status: AC
  Administered 2011-03-17: 2 g via INTRAVENOUS

## 2011-03-17 MED ORDER — METOCLOPRAMIDE HCL 5 MG/ML IJ SOLN
INTRAMUSCULAR | Status: DC | PRN
  Administered 2011-03-17: 10 mg via INTRAVENOUS

## 2011-03-17 MED ORDER — METOCLOPRAMIDE HCL 10 MG PO TABS: 5.0000 mg | ORAL_TABLET | Freq: Three times a day (TID) | ORAL | Status: DC | PRN

## 2011-03-17 MED ORDER — FENTANYL CITRATE 0.05 MG/ML IJ SOLN
INTRAMUSCULAR | Status: AC
  Filled 2011-03-17: qty 2

## 2011-03-17 MED ORDER — TRAZODONE HCL 50 MG PO TABS
50.0000 mg | ORAL_TABLET | Freq: Every day | ORAL | Status: DC
  Administered 2011-03-18: 50 mg via ORAL
  Filled 2011-03-17 (×3): qty 1

## 2011-03-17 MED ORDER — SUFENTANIL CITRATE 50 MCG/ML IV SOLN
INTRAVENOUS | Status: DC | PRN
  Administered 2011-03-17 (×6): 5 ug via INTRAVENOUS

## 2011-03-17 MED ORDER — DEXTROSE-NACL 5-0.9 % IV SOLN
INTRAVENOUS | Status: DC
  Administered 2011-03-17: 100 mL/h via INTRAVENOUS
  Administered 2011-03-18: 50 mL/h via INTRAVENOUS
  Administered 2011-03-19: 03:00:00 via INTRAVENOUS

## 2011-03-17 MED ORDER — PROPOFOL 10 MG/ML IV BOLUS
INTRAVENOUS | Status: DC | PRN
  Administered 2011-03-17: 30 mg via INTRAVENOUS
  Administered 2011-03-17: 50 mg via INTRAVENOUS
  Administered 2011-03-17: 200 mg via INTRAVENOUS

## 2011-03-17 MED ORDER — PROMETHAZINE HCL 25 MG/ML IJ SOLN: 6.2500 mg | INTRAMUSCULAR | Status: DC | PRN

## 2011-03-17 MED ORDER — ONDANSETRON HCL 4 MG PO TABS: 4.0000 mg | ORAL_TABLET | Freq: Four times a day (QID) | ORAL | Status: DC | PRN

## 2011-03-17 MED ORDER — TEMAZEPAM 15 MG PO CAPS
15.0000 mg | ORAL_CAPSULE | Freq: Every evening | ORAL | Status: DC | PRN
  Administered 2011-03-18: 30 mg via ORAL
  Filled 2011-03-17: qty 2

## 2011-03-17 MED ORDER — EPHEDRINE SULFATE 50 MG/ML IJ SOLN
INTRAMUSCULAR | Status: DC | PRN
  Administered 2011-03-17: 5 mg via INTRAVENOUS

## 2011-03-17 MED ORDER — CEFAZOLIN SODIUM-DEXTROSE 2-3 GM-% IV SOLR
INTRAVENOUS | Status: AC
  Filled 2011-03-17: qty 50

## 2011-03-17 MED ORDER — METOCLOPRAMIDE HCL 5 MG/ML IJ SOLN: 5.0000 mg | Freq: Three times a day (TID) | INTRAMUSCULAR | Status: DC | PRN

## 2011-03-17 MED ORDER — MIDAZOLAM HCL 5 MG/5ML IJ SOLN
INTRAMUSCULAR | Status: DC | PRN
  Administered 2011-03-17 (×2): 2 mg via INTRAVENOUS

## 2011-03-17 MED ORDER — KETOROLAC TROMETHAMINE 15 MG/ML IJ SOLN
15.0000 mg | Freq: Four times a day (QID) | INTRAMUSCULAR | Status: DC | PRN
  Administered 2011-03-17: 15 mg via INTRAVENOUS
  Filled 2011-03-17: qty 1

## 2011-03-17 MED ORDER — SODIUM CHLORIDE 0.9 % IV SOLN: INTRAVENOUS | Status: DC

## 2011-03-17 MED ORDER — METHOCARBAMOL 100 MG/ML IJ SOLN
500.0000 mg | Freq: Four times a day (QID) | INTRAVENOUS | Status: DC | PRN
  Filled 2011-03-17: qty 5

## 2011-03-17 MED ORDER — LACTATED RINGERS IV SOLN: INTRAVENOUS | Status: DC

## 2011-03-17 MED ORDER — TRAMADOL HCL 50 MG PO TABS
50.0000 mg | ORAL_TABLET | Freq: Four times a day (QID) | ORAL | Status: DC | PRN
  Administered 2011-03-18: 100 mg via ORAL
  Filled 2011-03-17: qty 2

## 2011-03-17 MED ORDER — ACETAMINOPHEN 10 MG/ML IV SOLN
1000.0000 mg | Freq: Once | INTRAVENOUS | Status: AC
  Administered 2011-03-17: 1000 mg via INTRAVENOUS

## 2011-03-17 MED ORDER — ACETAMINOPHEN 10 MG/ML IV SOLN: INTRAVENOUS | Status: DC | PRN

## 2011-03-17 MED ORDER — FENTANYL CITRATE 0.05 MG/ML IJ SOLN
25.0000 ug | INTRAMUSCULAR | Status: DC | PRN
  Administered 2011-03-17: 50 ug via INTRAVENOUS

## 2011-03-17 MED ORDER — TOPIRAMATE 100 MG PO TABS
200.0000 mg | ORAL_TABLET | Freq: Every evening | ORAL | Status: DC | PRN
  Administered 2011-03-18: 200 mg via ORAL
  Filled 2011-03-17 (×2): qty 2

## 2011-03-17 MED ORDER — ACETAMINOPHEN 10 MG/ML IV SOLN
1000.0000 mg | Freq: Four times a day (QID) | INTRAVENOUS | Status: AC
  Administered 2011-03-17 – 2011-03-18 (×3): 1000 mg via INTRAVENOUS
  Filled 2011-03-17 (×3): qty 100

## 2011-03-17 MED ORDER — FLUOXETINE HCL 20 MG PO CAPS
20.0000 mg | ORAL_CAPSULE | Freq: Every day | ORAL | Status: DC
  Administered 2011-03-18 – 2011-03-19 (×2): 20 mg via ORAL
  Filled 2011-03-17 (×2): qty 1

## 2011-03-17 MED ORDER — LIDOCAINE HCL (CARDIAC) 10 MG/ML IV SOLN
INTRAVENOUS | Status: DC | PRN
  Administered 2011-03-17: 100 mg via INTRAVENOUS

## 2011-03-17 MED ORDER — OLANZAPINE 2.5 MG PO TABS
2.5000 mg | ORAL_TABLET | Freq: Every day | ORAL | Status: DC
  Administered 2011-03-18 – 2011-03-19 (×2): 2.5 mg via ORAL
  Filled 2011-03-17 (×2): qty 1

## 2011-03-17 MED ORDER — PANTOPRAZOLE SODIUM 40 MG PO TBEC
40.0000 mg | DELAYED_RELEASE_TABLET | Freq: Every day | ORAL | Status: DC
  Administered 2011-03-18: 40 mg via ORAL
  Filled 2011-03-17 (×2): qty 1

## 2011-03-17 MED ORDER — DEXAMETHASONE SODIUM PHOSPHATE 10 MG/ML IJ SOLN: 10.0000 mg | Freq: Once | INTRAMUSCULAR | Status: DC

## 2011-03-17 MED ORDER — PHENYLEPHRINE HCL 10 MG/ML IJ SOLN
INTRAMUSCULAR | Status: DC | PRN
  Administered 2011-03-17 (×3): 80 ug via INTRAVENOUS

## 2011-03-17 SURGICAL SUPPLY — 47 items
BANDAGE ELASTIC 6 VELCRO ST LF (GAUZE/BANDAGES/DRESSINGS) ×2 IMPLANT
BANDAGE ESMARK 6X9 LF (GAUZE/BANDAGES/DRESSINGS) ×1 IMPLANT
BNDG CMPR 9X6 STRL LF SNTH (GAUZE/BANDAGES/DRESSINGS) ×1
BNDG ESMARK 6X9 LF (GAUZE/BANDAGES/DRESSINGS) ×2
CLOTH BEACON ORANGE TIMEOUT ST (SAFETY) ×2 IMPLANT
CUFF TOURN SGL QUICK 18 (TOURNIQUET CUFF) IMPLANT
CUFF TOURN SGL QUICK 34 (TOURNIQUET CUFF) ×2
CUFF TRNQT CYL 34X4X40X1 (TOURNIQUET CUFF) IMPLANT
DRAPE C-ARMOR (DRAPES) IMPLANT
DRAPE EXTREMITY T 121X128X90 (DRAPE) ×2 IMPLANT
DRAPE INCISE IOBAN 66X45 STRL (DRAPES) IMPLANT
DRAPE ORTHO SPLIT 77X108 STRL (DRAPES)
DRAPE SURG ORHT 6 SPLT 77X108 (DRAPES) IMPLANT
DRSG ADAPTIC 3X8 NADH LF (GAUZE/BANDAGES/DRESSINGS) ×2 IMPLANT
DRSG EMULSION OIL 3X16 NADH (GAUZE/BANDAGES/DRESSINGS) ×1 IMPLANT
DRSG PAD ABDOMINAL 8X10 ST (GAUZE/BANDAGES/DRESSINGS) ×2 IMPLANT
DURAPREP 26ML APPLICATOR (WOUND CARE) ×2 IMPLANT
ELECT REM PT RETURN 9FT ADLT (ELECTROSURGICAL) ×2
ELECTRODE REM PT RTRN 9FT ADLT (ELECTROSURGICAL) ×1 IMPLANT
GAUZE SPONGE 4X4 12PLY STRL LF (GAUZE/BANDAGES/DRESSINGS) ×1 IMPLANT
GLOVE BIO SURGEON STRL SZ7.5 (GLOVE) IMPLANT
GLOVE BIO SURGEON STRL SZ8 (GLOVE) ×4 IMPLANT
GOWN STRL NON-REIN LRG LVL3 (GOWN DISPOSABLE) ×2 IMPLANT
GOWN STRL REIN XL XLG (GOWN DISPOSABLE) ×2 IMPLANT
IMMOBILIZER KNEE 20 (SOFTGOODS) ×2
IMMOBILIZER KNEE 20 THIGH 36 (SOFTGOODS) IMPLANT
KIT BASIN OR (CUSTOM PROCEDURE TRAY) ×2 IMPLANT
MANIFOLD NEPTUNE II (INSTRUMENTS) ×2 IMPLANT
NS IRRIG 1000ML POUR BTL (IV SOLUTION) ×2 IMPLANT
PACK TOTAL JOINT (CUSTOM PROCEDURE TRAY) ×2 IMPLANT
PADDING CAST COTTON 6X4 STRL (CAST SUPPLIES) ×1 IMPLANT
PADDING WEBRIL 6 STERILE (GAUZE/BANDAGES/DRESSINGS) ×1 IMPLANT
POSITIONER SURGICAL ARM (MISCELLANEOUS) ×2 IMPLANT
SPONGE GAUZE 4X4 12PLY (GAUZE/BANDAGES/DRESSINGS) ×2 IMPLANT
SPONGE LAP 18X18 X RAY DECT (DISPOSABLE) ×1 IMPLANT
STAPLER VISISTAT 35W (STAPLE) IMPLANT
STOCKINETTE 8 INCH (MISCELLANEOUS) ×1 IMPLANT
STRIP CLOSURE SKIN 1/2X4 (GAUZE/BANDAGES/DRESSINGS) ×2 IMPLANT
SUT MNCRL 0 MO-4 VIOLET 18 CR (SUTURE) ×1 IMPLANT
SUT MONOCRYL 0 MO 4 18  CR/8 (SUTURE) ×1
SUT VIC AB 0 CT1 27 (SUTURE) ×2
SUT VIC AB 0 CT1 27XBRD ANTBC (SUTURE) ×1 IMPLANT
SUT VIC AB 2-0 CT1 27 (SUTURE) ×2
SUT VIC AB 2-0 CT1 TAPERPNT 27 (SUTURE) ×1 IMPLANT
TOWEL OR 17X26 10 PK STRL BLUE (TOWEL DISPOSABLE) ×4 IMPLANT
UNDERPAD 30X30 INCONTINENT (UNDERPADS AND DIAPERS) ×2 IMPLANT
WATER STERILE IRR 1500ML POUR (IV SOLUTION) ×2 IMPLANT

## 2011-03-17 NOTE — Brief Op Note (Signed)
03/17/2011  7:00 PM  PATIENT:  Gregory [name]  51 y.o. male  PRE-OPERATIVE DIAGNOSIS:  painful hardware left knee  POST-OPERATIVE DIAGNOSIS:  painful hardware left knee  PROCEDURE:  Procedure(s): HARDWARE REMOVAL Left femur  SURGEON:  Surgeon(s): Loanne Drilling, MD  PHYSICIAN ASSISTANT:   ASSISTANTS: Dimitri Ped, Pa-C   ANESTHESIA:   general  EBL:  Total I/O In: 2000 [I.V.:2000] Out: 125 [Blood:125]  BLOOD ADMINISTERED:none  DRAINS: (medium) Hemovact drain(s) in the left thigh with  Suction Open   LOCAL MEDICATIONS USED:  NONE  COUNTS:  YES  TOURNIQUET:   Total Tourniquet Time Documented: Thigh (Left) - 56 minutes  DICTATION: .Other Dictation: Dictation Number [id number]  PLAN OF CARE: Admit for overnight observation  PATIENT DISPOSITION:  PACU - hemodynamically stable.

## 2011-03-17 NOTE — Anesthesia Preprocedure Evaluation (Signed)
Anesthesia Evaluation  Patient identified by MRN, date of birth, ID band Patient awake    Reviewed: Allergy & Precautions, H&P , NPO status , Patient's Chart, lab work & pertinent test results  Airway Mallampati: II TM Distance: >3 FB Neck ROM: Full    Dental No notable dental hx.    Pulmonary neg pulmonary ROS, sleep apnea ,  clear to auscultation  Pulmonary exam normal       Cardiovascular Pt. on medications neg cardio ROS Regular Normal    Neuro/Psych  Headaches, Negative Neurological ROS  Negative Psych ROS   GI/Hepatic negative GI ROS, Neg liver ROS, GERD-  Medicated and Controlled,  Endo/Other  Negative Endocrine ROS  Renal/GU negative Renal ROS  Genitourinary negative   Musculoskeletal negative musculoskeletal ROS (+)   Abdominal   Peds negative pediatric ROS (+)  Hematology negative hematology ROS (+)   Anesthesia Other Findings   Reproductive/Obstetrics negative OB ROS                           Anesthesia Physical Anesthesia Plan  ASA: II  Anesthesia Plan: General   Post-op Pain Management:    Induction: Intravenous  Airway Management Planned:   Additional Equipment:   Intra-op Plan:   Post-operative Plan: Extubation in OR  Informed Consent: I have reviewed the patients History and Physical, chart, labs and discussed the procedure including the risks, benefits and alternatives for the proposed anesthesia with the patient or authorized representative who has indicated his/her understanding and acceptance.   Dental advisory given  Plan Discussed with: CRNA  Anesthesia Plan Comments:         Anesthesia Quick Evaluation

## 2011-03-17 NOTE — Anesthesia Postprocedure Evaluation (Signed)
  Anesthesia Post-op Note  [name]  Procedure(s) Performed:  HARDWARE REMOVAL - Hardware Removal Left Knee  Patient Location: PACU  Anesthesia Type: General  Level of Consciousness: awake and alert   Airway and Oxygen Therapy: Patient Spontanous Breathing  Post-op Pain: mild  Post-op Assessment: Post-op Vital signs reviewed, Patient's Cardiovascular Status Stable, Respiratory Function Stable, Patent Airway and No signs of Nausea or vomiting  Post-op Vital Signs: stable  Complications: No apparent anesthesia complications

## 2011-03-17 NOTE — Interval H&P Note (Signed)
History and Physical Interval Note:  03/17/2011 4:44 PM  Gregory [name]  has presented today for surgery, with the diagnosis of Left Knee painful hardware  The various methods of treatment have been discussed with the patient and family. After consideration of risks, benefits and other options for treatment, the patient has consented to  Procedure(s): HARDWARE REMOVAL as a surgical intervention .  The patients' history has been reviewed, patient examined, no change in status, stable for surgery.  I have reviewed the patients' chart and labs.  Questions were answered to the patient's satisfaction.     Loanne Drilling

## 2011-03-17 NOTE — Transfer of Care (Signed)
Immediate Anesthesia Transfer of Care Note  [name]  Procedure(s) Performed:  HARDWARE REMOVAL - Hardware Removal Left Knee  Patient Location: PACU  Anesthesia Type: General  Level of Consciousness: awake and sedated  Airway & Oxygen Therapy: Patient Spontanous Breathing and Patient connected to face mask oxygen  Post-op Assessment: Report given to PACU RN, Post -op Vital signs reviewed and stable and Patient moving all extremities X 4  Post vital signs: stable  Complications: No apparent anesthesia complications Pt woke up combative and fighting Hx of ETOH, Resedated  For transport and pt safety

## 2011-03-18 ENCOUNTER — Encounter (HOSPITAL_COMMUNITY): Payer: Self-pay | Admitting: Orthopedic Surgery

## 2011-03-18 LAB — URINALYSIS, ROUTINE W REFLEX MICROSCOPIC
Ketones, ur: NEGATIVE mg/dL
Leukocytes, UA: NEGATIVE
Nitrite: NEGATIVE
Protein, ur: NEGATIVE mg/dL

## 2011-03-18 LAB — NO BLOOD PRODUCTS

## 2011-03-18 MED ORDER — DEXLANSOPRAZOLE 60 MG PO CPDR
60.0000 mg | DELAYED_RELEASE_CAPSULE | Freq: Two times a day (BID) | ORAL | Status: DC
  Administered 2011-03-18 – 2011-03-19 (×3): 60 mg via ORAL
  Filled 2011-03-18 (×4): qty 1

## 2011-03-18 MED ORDER — OXYCODONE HCL 5 MG PO TABS
5.0000 mg | ORAL_TABLET | ORAL | Status: DC | PRN
  Administered 2011-03-18: 10 mg via ORAL
  Administered 2011-03-18: 8 mg via ORAL
  Administered 2011-03-18: 5 mg via ORAL
  Administered 2011-03-19: 10 mg via ORAL
  Filled 2011-03-18 (×4): qty 2

## 2011-03-18 MED ORDER — NON FORMULARY: 60.0000 mg | Freq: Two times a day (BID) | Status: DC

## 2011-03-18 MED ORDER — DIPHENHYDRAMINE HCL 25 MG PO CAPS
25.0000 mg | ORAL_CAPSULE | Freq: Four times a day (QID) | ORAL | Status: DC | PRN
  Administered 2011-03-18 – 2011-03-19 (×2): 25 mg via ORAL
  Filled 2011-03-18 (×2): qty 1
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
## 2011-03-18 NOTE — Progress Notes (Signed)
Chart reviewed, outpatient in bed status.  No review required
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
## 2011-03-18 NOTE — Progress Notes (Signed)
Physical Therapy Treatment Patient Details Name: Gregory [name] MRN: [id number] DOB: 1960/11/19 Today's Date: 03/18/2011  PT Assessment/Plan  PT - Assessment/Plan PT Plan: Discharge plan remains appropriate PT Frequency: 7X/week Follow Up Recommendations: No PT follow up Equipment Recommended: Rolling walker with 5" wheels PT Goals  Acute Rehab PT Goals PT Goal Formulation: With patient Time For Goal Achievement: 7 days Pt will go Supine/Side to Sit: with supervision Pt will go Sit to Supine/Side: with supervision PT Goal: Sit to Supine/Side - Progress: Progressing toward goal Pt will go Sit to Stand: with supervision PT Goal: Sit to Stand - Progress: Progressing toward goal Pt will go Stand to Sit: with supervision PT Goal: Stand to Sit - Progress: Progressing toward goal Pt will Ambulate: 51 - 150 feet;with supervision;with least restrictive assistive device PT Goal: Ambulate - Progress: Progressing toward goal Pt will Go Up / Down Stairs: Flight;with min assist;with least restrictive assistive device  PT Treatment Precautions/Restrictions  Precautions Precaution Comments: NO ROM L KNEE Required Braces or Orthoses: Yes Knee Immobilizer: On at all times Restrictions Weight Bearing Restrictions: No Other Position/Activity Restrictions: WBAT Mobility (including Balance) Bed Mobility Sit to Supine: 4: Min assist Sit to Supine - Details (indicate cue type and reason): min assist with L LE Transfers Sit to Stand: 4: Min assist;5: Supervision;With upper extremity assist;With armrests;From chair/3-in-1 Sit to Stand Details (indicate cue type and reason): cues for use of UEs and for LE position Stand to Sit: 4: Min assist;5: Supervision;With upper extremity assist;To bed Stand to Sit Details: cues for use of UEs and for LE position Ambulation/Gait Ambulation/Gait Assistance: 4: Min assist;5: Supervision Ambulation/Gait Assistance Details (indicate cue type and reason): cues for  sequence, position from RW and posture Ambulation Distance (Feet): 280 Feet Assistive device: Rolling walker Gait Pattern: Step-to pattern;Step-through pattern    Exercise    End of Session PT - End of Session Equipment Utilized During Treatment: Left knee immobilizer Activity Tolerance: Patient tolerated treatment well Patient left: in bed;with call bell in reach;with family/visitor present Nurse Communication: Mobility status for transfers;Mobility status for ambulation General Behavior During Session: [facility] for tasks performed Cognition: [facility] for tasks performed  Gregory [name] 03/18/2011, 4:02 PM

## 2011-03-19 MED ORDER — HYDROCODONE-ACETAMINOPHEN 5-325 MG PO TABS
1.0000 | ORAL_TABLET | ORAL | Status: DC | PRN
  Administered 2011-03-19: 2 via ORAL
  Filled 2011-03-19: qty 2

## 2011-03-19 MED ORDER — HYDROCODONE-ACETAMINOPHEN 5-325 MG PO TABS: 1.0000 | ORAL_TABLET | ORAL | Status: AC | PRN

## 2011-03-19 MED ORDER — METHOCARBAMOL 500 MG PO TABS: 500.0000 mg | ORAL_TABLET | Freq: Four times a day (QID) | ORAL | Status: AC | PRN

## 2011-03-19 NOTE — Progress Notes (Signed)
Physical Therapy Treatment Patient Details Name: Gregory [name] MRN: [id number] DOB: 01-Aug-1960 Today's Date: 03/19/2011  PT Assessment/Plan  PT - Assessment/Plan Comments on Treatment Session: Pt eager for d/c home PT Plan: Discharge plan remains appropriate PT Frequency: 7X/week Follow Up Recommendations: No PT follow up Equipment Recommended: Rolling walker with 5" wheels PT Goals  Acute Rehab PT Goals PT Goal Formulation: With patient Time For Goal Achievement: 7 days Pt will go Supine/Side to Sit: with supervision PT Goal: Supine/Side to Sit - Progress: Met Pt will go Sit to Supine/Side: with supervision Pt will go Sit to Stand: with supervision PT Goal: Sit to Stand - Progress: Met Pt will go Stand to Sit: with supervision PT Goal: Stand to Sit - Progress: Met Pt will Ambulate: 51 - 150 feet;with supervision;with least restrictive assistive device PT Goal: Ambulate - Progress: Met Pt will Go Up / Down Stairs: Flight;with min assist;with least restrictive assistive device PT Goal: Up/Down Stairs - Progress: Met  PT Treatment Precautions/Restrictions  Precautions Precaution Comments: NO ROM L KNEE Required Braces or Orthoses: Yes Knee Immobilizer: On at all times Restrictions Weight Bearing Restrictions: No Other Position/Activity Restrictions: WBAT Mobility (including Balance) Bed Mobility Supine to Sit: 5: Supervision Supine to Sit Details (indicate cue type and reason): pt self assisted L LE with UEs Transfers Sit to Stand: 5: Supervision;With upper extremity assist;From bed Sit to Stand Details (indicate cue type and reason): cues for use of UEs Stand to Sit: 5: Supervision Stand to Sit Details: cues for use of UEs Ambulation/Gait Ambulation/Gait Assistance: 5: Supervision Ambulation/Gait Assistance Details (indicate cue type and reason): cues for position from RW/crutches Ambulation Distance (Feet): 400 Feet Assistive device: Rolling walker (100' with RW,  300' with crutches) Gait Pattern: Step-through pattern Stairs: Yes Stairs Assistance: 4: Min assist Stairs Assistance Details (indicate cue type and reason): cues for sequence and crutch placement Stair Management Technique: One rail Right;Forwards;With crutches Number of Stairs: 10     Exercise    End of Session PT - End of Session Equipment Utilized During Treatment: Left knee immobilizer Activity Tolerance: Patient tolerated treatment well Patient left: in chair;with call bell in reach;with family/visitor present General Behavior During Session: [facility] for tasks performed Cognition: [facility] for tasks performed  Norlene Lanes 03/19/2011, 2:42 PM

## 2011-03-19 NOTE — Progress Notes (Signed)
  CARE MANAGEMENT NOTE 03/19/2011  Patient:  Gregory [name], Gregory [name]   Account Number:  [id number]  Date Initiated:  03/19/2011  Documentation initiated by:  Colleen Can  Subjective/Objective Assessment:   dx left knee hardware removal     Action/Plan:   CM plans to return to his home where family will be caregivers. Orders for RW; no recommendations for [facility] services   Anticipated DC Date:  03/19/2011   Anticipated DC Plan:  HOME/SELF CARE  In-house referral  NA      DC Planning Services  CM consult      PAC Choice  NA   Choice offered to / List presented to:  NA   DME arranged  [facility]ALKER - ROLLING      DME agency  OTHER - SEE NOTE     HH arranged  NA      HH agency  NA   Status of service:  Completed, signed off Medicare Important Message given?  NA - LOS <3 / Initial given by admissions (If response is "NO", the following Medicare IM given date fields will be blank) Date Medicare IM given:   Date Additional Medicare IM given:    Discharge Disposition:  HOME/SELF CARE    Comments:  RW delivered to patient's room by [facility]. Pt discharged to home
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
## 2011-03-20 ENCOUNTER — Other Ambulatory Visit (HOSPITAL_COMMUNITY): Payer: Medicare Other
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
## 2011-04-14 ENCOUNTER — Telehealth: Payer: Self-pay | Admitting: Family Medicine

## 2011-04-14 NOTE — Telephone Encounter (Signed)
Wife is calling to find out from Dr. Armen Pickup if her husband is going to need another clearance for his next surgery.  Just in case he does, he has an appt with Dr. Armen Pickup for this Friday 3/1, but that can be cancelled if it is not needed.

## 2011-04-14 NOTE — Telephone Encounter (Signed)
Tye Savoy is the patient's PCP.

## 2011-04-18 ENCOUNTER — Ambulatory Visit: Payer: Medicare Other | Admitting: Family Medicine

## 2011-04-23 ENCOUNTER — Ambulatory Visit (INDEPENDENT_AMBULATORY_CARE_PROVIDER_SITE_OTHER): Payer: Medicare Other | Admitting: Family Medicine

## 2011-04-23 ENCOUNTER — Other Ambulatory Visit: Payer: Self-pay | Admitting: Orthopedic Surgery

## 2011-04-23 ENCOUNTER — Encounter: Payer: Self-pay | Admitting: Family Medicine

## 2011-04-23 VITALS — BP 133/84 | HR 63 | Temp 97.7°F | Ht 67.0 in | Wt 181.0 lb

## 2011-04-23 DIAGNOSIS — R05 Cough: Secondary | ICD-10-CM

## 2011-04-23 DIAGNOSIS — R21 Rash and other nonspecific skin eruption: Secondary | ICD-10-CM

## 2011-04-23 DIAGNOSIS — R059 Cough, unspecified: Secondary | ICD-10-CM | POA: Diagnosis not present

## 2011-04-23 MED ORDER — PREDNISONE (PAK) 10 MG PO TABS: 10.0000 mg | ORAL_TABLET | Freq: Every day | ORAL | Status: AC

## 2011-04-23 MED ORDER — HYDROCORTISONE 1 % EX LOTN: TOPICAL_LOTION | Freq: Two times a day (BID) | CUTANEOUS | Status: DC

## 2011-04-23 MED ORDER — BENZONATATE 200 MG PO CAPS: 200.0000 mg | ORAL_CAPSULE | Freq: Two times a day (BID) | ORAL | Status: AC | PRN

## 2011-04-23 MED ORDER — CETIRIZINE HCL 10 MG PO TABS: 10.0000 mg | ORAL_TABLET | Freq: Every day | ORAL | Status: DC

## 2011-04-23 NOTE — Patient Instructions (Addendum)
Gregory [name],   Thank you for coming in to see me today. The patient did cough is mostly due to an allergic cause. Please take the Tessalon as needed for cough please restart the Claritin and continue the Dexilant.  In addition take prednisone 10 mg daily for one week.  For your itching please take Zyrtec. Also please use the hydrocortisone lotion twice daily. Apply after warm (not hot) showers.   Please give the medicine two weeks to work if your are not better in two return for follow up with your primary.  Dr. Armen Pickup

## 2011-04-24 NOTE — Progress Notes (Signed)
Subjective:     Patient ID: Gregory [name], male   DOB: [birth year], 51 y.o.   MRN: [id number]  HPI 51 yo ex-smoker presents for same day visit with a complaint of dry cough x 2 months that has worsened over the past 2 weeks. He denies associated fever, chest pain, SOB, malaise, chills, night sweats or weight loss. He has a history of GERD but denies worsening symptoms at night and is compliant with his PPI. He denies URI symptoms but does admit to itching and rash on his back and arms x 5 days. He denies sick contacts. He denies using new skin products. He admits to having dogs in the home but they have lived for there for many months. He is not compliant with his Claritin.   He had similar symptoms last year for which he was treated with tessalon and a prednisone burst. He  Review of Systems As Per HPI     Objective:   Physical Exam BP 133/84  Pulse 63  Temp(Src) 97.7 F (36.5 C) (Oral)  Ht 5\' 7"  (1.702 m)  Wt 181 lb (82.101 kg)  BMI 28.35 kg/m2  SpO2 97% General appearance: alert, cooperative and no distress Nose: Nares normal. Septum midline. Mucosa normal. No drainage or sinus tenderness. Throat: lips, mucosa, and tongue normal; edentulous.  Lungs: clear to auscultation bilaterally Heart: regular rate and rhythm, S1, S2 normal, no murmur, click, rub or gallop Skin: diffuse rash on back with distinct erythematous papules some with associated pustule. Surrouding skin normal except for excoriations. Assessment:         Plan:

## 2011-04-28 NOTE — Assessment & Plan Note (Signed)
A: cough and rash concerning for allergic reaction. No fever or contacts with similar symptoms to suggest infection etiology. Patient with similar cough in the past, but the rash is new.  P:  -Tessalon, Claritin, prednisone pack for cough.  -zyrtec and hydrocortisone lotion for rash.

## 2011-06-11 ENCOUNTER — Encounter (HOSPITAL_COMMUNITY): Payer: Self-pay

## 2011-06-11 ENCOUNTER — Emergency Department (INDEPENDENT_AMBULATORY_CARE_PROVIDER_SITE_OTHER): Payer: Medicare Other

## 2011-06-11 ENCOUNTER — Emergency Department (INDEPENDENT_AMBULATORY_CARE_PROVIDER_SITE_OTHER)
Admission: EM | Admit: 2011-06-11 | Discharge: 2011-06-11 | Disposition: A | Discharge status: Home / Self Care | Payer: Medicare Other | Source: Home / Self Care | Attending: Emergency Medicine | Admitting: Emergency Medicine

## 2011-06-11 DIAGNOSIS — M109 Gout, unspecified: Secondary | ICD-10-CM | POA: Diagnosis not present

## 2011-06-11 DIAGNOSIS — M19079 Primary osteoarthritis, unspecified ankle and foot: Secondary | ICD-10-CM | POA: Diagnosis not present

## 2011-06-11 DIAGNOSIS — M79609 Pain in unspecified limb: Secondary | ICD-10-CM | POA: Diagnosis not present

## 2011-06-11 MED ORDER — KETOROLAC TROMETHAMINE 60 MG/2ML IM SOLN
INTRAMUSCULAR | Status: AC
  Filled 2011-06-11: qty 2

## 2011-06-11 MED ORDER — OXYCODONE-ACETAMINOPHEN 5-325 MG PO TABS: ORAL_TABLET | ORAL | Status: AC

## 2011-06-11 MED ORDER — KETOROLAC TROMETHAMINE 60 MG/2ML IM SOLN
60.0000 mg | Freq: Once | INTRAMUSCULAR | Status: AC
  Administered 2011-06-11: 60 mg via INTRAMUSCULAR

## 2011-06-11 MED ORDER — COLCHICINE 0.6 MG PO TABS: ORAL_TABLET | ORAL | Status: DC

## 2011-06-11 NOTE — ED Provider Notes (Signed)
Chief Complaint  Patient presents with  . Foot Pain    History of Present Illness:   The patient is a 51 year old male who has had a four-day history of right foot pain and swelling. He denies any injury. He states he just woke up 4 days ago with the pain the swelling has gotten worse since then. It seems to be most severe over the right great toe MTP joint it radiates towards the ankle as well. It sort of touch, sore to walk, and even sore if he has a shoe or sock on the foot. He's not had any attacks like this before. He has no known history of arthritis or gout. No family history of gout. He has not used alcohol at all. He denies any fever, chills, sweats, or skin rash.  Review of Systems:  Other than noted above, the patient denies any of the following symptoms: Systemic:  No fevers, chills, sweats, or aches.  No fatigue or tiredness. Musculoskeletal:  No joint pain, arthritis, bursitis, swelling, back pain, or neck pain. Neurological:  No muscular weakness, paresthesias, headache, or trouble with speech or coordination.  No dizziness.   PMFSH:  Past medical history, family history, social history, meds, and allergies were reviewed.  Physical Exam:   Vital signs:  BP 129/83  Pulse 57  Temp(Src) 97.6 F (36.4 C) (Oral)  Resp 18  SpO2 100% Gen:  Alert and oriented times 3.  In no distress. Musculoskeletal: There is no visible swelling, redness, or heat. He has exquisite tenderness to palpation over the MTP joint of the great toe and over the entire dorsum of the foot but not over the ankle. There is pain with movement of his toes. Otherwise, all joints had a full a ROM with no swelling, bruising or deformity.  No edema, pulses full. Extremities were warm and pink.  Capillary refill was brisk.  Skin:  Clear, warm and dry.  No rash. Neuro:  Alert and oriented times 3.  Muscle strength was normal.  Sensation was intact to light touch.   Radiology:  Dg Foot Complete Right  06/11/2011   *RADIOLOGY REPORT*  Clinical Data: Twisting injury 4 days ago.  Pain.  RIGHT FOOT COMPLETE - 3+ VIEW  Comparison: None.  Findings: There is no fracture or dislocation.  Mild first MTP degenerative change is identified.  IMPRESSION: No acute finding.  Mild first MTP osteoarthritis.  Original Report Authenticated By: Bernadene Bell. Maricela Curet, M.D.   Other Labs Obtained at Urgent Care Center:  A uric acid level was obtained.  Results are pending at this time and we will call about any positive results.  Course in Urgent Care Center:   He was given Toradol 60 mg IM and tolerated this well without any immediate side effects.  Assessment:  The encounter diagnosis was Gout.  Plan:   1.  The following meds were prescribed:   New Prescriptions   COLCHICINE 0.6 MG TABLET    Take 2 now and 1 in 1 hour.  May repeat dose once daily.  For gout attack.   OXYCODONE-ACETAMINOPHEN (PERCOCET) 5-325 MG PER TABLET    1 to 2 tablets every 6 hours as needed for pain.   2.  The patient was instructed in symptomatic care, including rest and activity, elevation, application of ice and compression.  Appropriate handouts were given. 3.  The patient was told to return if becoming worse in any way, if no better in 3 or 4 days, and given some red  flag symptoms that would indicate earlier return.   4.  The patient was told to follow up with his primary care physician in 2 weeks.   Reuben Likes, MD 06/11/11 [phone]

## 2011-06-11 NOTE — Discharge Instructions (Signed)
Dietary treatment plays a supplementary role in treatment of gout.  Diet can be expected to decrease the uric acid level by 10 to 15%.  Often medication can effect a much more substantial reduction.  An extremely restrictive diet is not necessary, but here are a few suggestions that might help decrease the frequency of gout attacks.  The first and most important measure is to achieve and maintain a healthy body weight (BMI of 20 to 25).  It's been shown that people with a BMI over 25 have an increased risk of gout attacks compared to people with a BMI of less than 25.  Also, gout patients who lose as little as 4.5 kg or 9.9 lbs will decrease their risk of gout attacks.  Beyond that, here are a few general guidelines:  Eat less: Red meat Seafood Beer and hard alcohol (e.g. gin, vodka, whiskey) Foods that contain high fructose corn syrup (found in sweets and non-diet sodas) Organ meats (liver, kidneys, brains, sweetbreads) or foods made from these meats (hot dogs, bologna).  Eat more: Low fat dairy products A moderate amount of wine (up to two 5 oz servings per day) is not likely to increase the risk of a gout attack. Coffee may decrease the risk of gout attacks Vitamin C (500 mg per day) has a mild urate lowering effect   Gout Gout is an inflammatory condition (arthritis) caused by a buildup of uric acid crystals in the joints. Uric acid is a chemical that is normally present in the blood. Under some circumstances, uric acid can form into crystals in your joints. This causes joint redness, soreness, and swelling (inflammation). Repeat attacks are common. Over time, uric acid crystals can form into masses (tophi) near a joint, causing disfigurement. Gout is treatable and often preventable. CAUSES  The disease begins with elevated levels of uric acid in the blood. Uric acid is produced by your body when it breaks down a naturally found substance called purines. This also happens when you eat  certain foods such as meats and fish. Causes of an elevated uric acid level include:  Being passed down from parent to child (heredity).   Diseases that cause increased uric acid production (obesity, psoriasis, some cancers).   Excessive alcohol use.   Diet, especially diets rich in meat and seafood.   Medicines, including certain cancer-fighting drugs (chemotherapy), diuretics, and aspirin.   Chronic kidney disease. The kidneys are no longer able to remove uric acid well.   Problems with metabolism.  Conditions strongly associated with gout include:  Obesity.   High blood pressure.   High cholesterol.   Diabetes.  Not everyone with elevated uric acid levels gets gout. It is not understood why some people get gout and others do not. Surgery, joint injury, and eating too much of certain foods are some of the factors that can lead to gout. SYMPTOMS   An attack of gout comes on quickly. It causes intense pain with redness, swelling, and warmth in a joint.   Fever can occur.   Often, only one joint is involved. Certain joints are more commonly involved:   Base of the big toe.   Knee.   Ankle.   Wrist.   Finger.  Without treatment, an attack usually goes away in a few days to weeks. Between attacks, you usually will not have symptoms, which is different from many other forms of arthritis. DIAGNOSIS  Your caregiver will suspect gout based on your symptoms and exam. Removal of fluid  from the joint (arthrocentesis) is done to check for uric acid crystals. Your caregiver will give you a medicine that numbs the area (local anesthetic) and use a needle to remove joint fluid for exam. Gout is confirmed when uric acid crystals are seen in joint fluid, using a special microscope. Sometimes, blood, urine, and X-ray tests are also used. TREATMENT  There are 2 phases to gout treatment: treating the sudden onset (acute) attack and preventing attacks (prophylaxis). Treatment of an Acute  Attack  Medicines are used. These include anti-inflammatory medicines or steroid medicines.   An injection of steroid medicine into the affected joint is sometimes necessary.   The painful joint is rested. Movement can worsen the arthritis.   You may use warm or cold treatments on painful joints, depending which works best for you.   Discuss the use of coffee, vitamin C, or cherries with your caregiver. These may be helpful treatment options.  Treatment to Prevent Attacks After the acute attack subsides, your caregiver may advise prophylactic medicine. These medicines either help your kidneys eliminate uric acid from your body or decrease your uric acid production. You may need to stay on these medicines for a very long time. The early phase of treatment with prophylactic medicine can be associated with an increase in acute gout attacks. For this reason, during the first few months of treatment, your caregiver may also advise you to take medicines usually used for acute gout treatment. Be sure you understand your caregiver's directions. You should also discuss dietary treatment with your caregiver. Certain foods such as meats and fish can increase uric acid levels. Other foods such as dairy can decrease levels. Your caregiver can give you a list of foods to avoid. HOME CARE INSTRUCTIONS   Do not take aspirin to relieve pain. This raises uric acid levels.   Only take over-the-counter or prescription medicines for pain, discomfort, or fever as directed by your caregiver.   Rest the joint as much as possible. When in bed, keep sheets and blankets off painful areas.   Keep the affected joint raised (elevated).   Use crutches if the painful joint is in your leg.   Drink enough water and fluids to keep your urine clear or pale yellow. This helps your body get rid of uric acid. Do not drink alcoholic beverages. They slow the passage of uric acid.   Follow your caregiver's dietary instructions.  Pay careful attention to the amount of protein you eat. Your daily diet should emphasize fruits, vegetables, whole grains, and fat-free or low-fat milk products.   Maintain a healthy body weight.  SEEK MEDICAL CARE IF:   You have an oral temperature above 102 F (38.9 C).   You develop diarrhea, vomiting, or any side effects from medicines.   You do not feel better in 24 hours, or you are getting worse.  SEEK IMMEDIATE MEDICAL CARE IF:   Your joint becomes suddenly more tender and you have:   Chills.   An oral temperature above 102 F (38.9 C), not controlled by medicine.  MAKE SURE YOU:   Understand these instructions.   Will watch your condition.   Will get help right away if you are not doing well or get worse.  Document Released: 02/01/2000 Document Revised: 01/23/2011 Document Reviewed: 05/14/2009 [facility] Patient Information 2012 [place], [place].  Purine Restricted Diet A low-purine diet consists of foods that reduce uric acid made in your body. INDICATIONS FOR USE  Your caregiver may ask you to  follow a low-purine diet to reduce gout flairs.  GUIDELINES  Avoid high-purine foods, including all alcohol, yeast extracts taken as supplements, and sauces made from meats (like gravy). Do not eat high-purine meats, including anchovies, sardines, herring, mussels, tuna, codfish, scallops, trout, haddock, bacon, organ meats, tripe, goose, wild game, and sweetbreads.  Grains  Allowed/Recommended: All, except those listed to consume in moderation.   Consume in Moderation: Oatmeal (? cup uncooked daily), wheat bran or germ ( cup daily), and whole grains.  Vegetables  Allowed/Recommended: All, except those listed to consume in moderation.   Consume in Moderation: Asparagus, cauliflower, spinach, mushrooms, and green peas ( cup daily).  Fruit  Allowed/Recommended: All.   Consume in Moderation: None.  Meat and Meat Substitutes  Allowed/Recommended: Eggs, nuts, and peanut  butter.   Consume in Moderation: Limit to 4 to 6 oz daily. Avoid high-purine meats. Lentils, peas, and dried beans (1 cup daily).  Milk  Allowed/Recommended: All. Choose low-fat or skim when possible.   Consume in Moderation: None.  Fats and Oils  Allowed/Recommended: All.   Consume in Moderation: None.  Beverages  Allowed/Recommended: All, except those listed to avoid.   Avoid: All alcohol.  Condiments/Miscellaneous  Allowed/Recommended: All, except those listed to consume in moderation.   Consume in Moderation: Bouillon and meat-based broths and soups.  Document Released: 05/31/2010 Document Revised: 01/23/2011 Document Reviewed: 05/31/2010 [facility] Patient Information 2012 [place], [place].

## 2011-06-11 NOTE — ED Notes (Addendum)
Woke Monday, went [facility], and on way to [facility] had sudden onset of pain right foot; no other trauma, okay when went to bed; pain on palpation or extension/flexion , good pulses DP and post tibial ; used tramadol for pain w/o relief When patient goes to x-ray, informs he stepped wrong and twisted foot on Sunday, and pain has gotten worse, so he could not get out of bed

## 2011-06-11 NOTE — ED Notes (Signed)
Incision absent on arrival

## 2011-06-13 ENCOUNTER — Encounter: Payer: Self-pay | Admitting: Family Medicine

## 2011-06-13 ENCOUNTER — Ambulatory Visit (INDEPENDENT_AMBULATORY_CARE_PROVIDER_SITE_OTHER): Payer: Medicare Other | Admitting: Family Medicine

## 2011-06-13 DIAGNOSIS — M109 Gout, unspecified: Secondary | ICD-10-CM | POA: Diagnosis not present

## 2011-06-13 MED ORDER — COLCHICINE 0.6 MG PO TABS: ORAL_TABLET | ORAL | Status: DC

## 2011-06-13 MED ORDER — NAPROXEN 500 MG PO TABS: 500.0000 mg | ORAL_TABLET | Freq: Two times a day (BID) | ORAL | Status: DC

## 2011-06-13 NOTE — Patient Instructions (Signed)
Colchicine- one tablet a day until your pain is gone  Will help with pain and swelling  Use ice to help with pain and swelling  Follow-up with your primary doctor in 2 weeks  If you notice increasing pain spreading redness, fever, or other concerns, come back sooner.

## 2011-06-13 NOTE — Assessment & Plan Note (Addendum)
First episode of probable gout based on report of initial presentation.  Will continue colchicine daily and will add naproxen BID.  Cellulitis also in differential but given improving, not spreading- less likley.  Given red flags for return sooner, otherwise will follow-up with PCP in 2 weeks to repeat uric acid and follow clinical improvement.

## 2011-06-13 NOTE — Progress Notes (Signed)
  Subjective:    Patient ID: Gregory [name], male    DOB: Dec 05, 1960, 51 y.o.   MRN: [id number]  HPI 5 days ago right lateral ankle and great toe red and hot.  Was diagnosed with gout at urgent care.  XRAY showed 1st MTP arthritis.   Prescribed colchicine and percocet- has been taking 3 pills every day.  Per patient started out with pain and swelling at big toe and dorsum of lateral foot.   Overall patient feels like it is improving but continues to be too painful to bear weight . no fever or chils.  No expanding redness.I have reviewed   patient's  PMH, FH, and Social history and Medications as related to this visit. No history of previous flare  Uric acid at urgent care 5.7  I have reviewed patient's  PMH, FH, and Social history and Medications as related to this visit.  Review of Systemssee HPI     Objective:   Physical Exam GEN: NAD, here with wife, on crutches Right foot:  Painful over lateral dorsum of foot with visible swelling, slight warmth, no significant edema.  1st MTP and lateral malleolus now improved.  No skin lesions.      Assessment & Plan:

## 2011-06-14 DIAGNOSIS — IMO0002 Reserved for concepts with insufficient information to code with codable children: Secondary | ICD-10-CM | POA: Diagnosis not present

## 2011-06-14 DIAGNOSIS — M1710 Unilateral primary osteoarthritis, unspecified knee: Secondary | ICD-10-CM | POA: Diagnosis not present

## 2011-06-26 ENCOUNTER — Ambulatory Visit: Payer: Medicare Other | Admitting: Family Medicine

## 2011-07-02 ENCOUNTER — Other Ambulatory Visit: Payer: Self-pay | Admitting: Family Medicine

## 2011-07-02 NOTE — H&P (Signed)
Gregory [name] DOB: 02/23/1960  Chief Complaint: left knee pain  History of Present Illness The patient is a 51 year old male who comes in today for a preoperative History and Physical. The patient is scheduled for a left total knee arthroplasty to be performed by Dr. Gus Rankin. Aluisio, MD at [facility] on Wednesday Jul 16, 2011 . He had a comminuted distal femur fracture treated with open reduction internal fixation by Dr. Jerl Santos about nine years ago. It was a long and difficult recovery. The patient has been left with significant pain since. He must of gone on to valgus deformity because he went to Dr. Orlean Bradford at [facility] and had a supracondylar osteotomy with a DCS plate and screws. He was having significant pain in the distal thigh as well as in his knee. The knee is very stiff. It limits what he can and cannot do. He is having pain throughout the day as well as pain at night. The knees are not giving out on him. He is not having any hip pain, back pain, lower extremity weakness or paresthesias with this. Since having the hardware removed 3 months ago his pain is somewhat decreased but he is still very limited in regards to function. Most predictable means for increased function and decreased pain is a left total knee arthroplasty. Risks and benefits of the surgery discussed. PCP: [facility]     Past MedicalHistory Knee Pain (719.46) Osteoarthritis, Knee (715.96) Foot pain (729.5) Ulcer disease Depression Gastroesophageal Reflux Disease Chronic Pain Sleep Apnea. does not use CPAP Migraine Headache   Allergies Dilaudid *ANALGESICS - OPIOID* Demerol *ANALGESICS - OPIOID* Morphine Derivatives   Family History Father. living age 59: bilaterally above the knee amputation due to complciations from DM Mother. living in good health age 102 Alzheimer's disease. Maternal Grandmother.   Social History Exercise. Exercises never Tobacco  use. former smoker Pain Contract. no Drug/Alcohol Rehab (Currently). no Drug/Alcohol Rehab (Previously). no Illicit drug use. no Living situation. live with spouse Marital status. married Number of flights of stairs before winded. 1 Children. 5 or more Current work status. disabled Alcohol use. former drinker [employer]. none Post-Surgical Plans. Home where wife will be caregiver   Medication History Indomethacin (25MG  Capsule, 2 (two) Oral every 12 hours,  FLUoxetine HCl (20MG  Capsule, Oral) Active. ZyPREXA (2.5MG  Tablet, Oral) Active. TraZODone HCl (50MG  Tablet, Oral) Active. Dexilant ( Oral) Specific dose unknown - Active.   Past Surgical History ORIF. left distal femur 2003 Hardware removal: left distal femur. 2013 Cervical fusion C4-C5. 2001    Review of Systems General:Not Present- Chills, Fever, Night Sweats, Fatigue, Weight Gain, Weight Loss and Memory Loss. Skin:Not Present- Hives, Itching, Rash, Eczema and Lesions. HEENT:Present- Headache. Not Present- Tinnitus, Double Vision, Visual Loss, Hearing Loss and Dentures. Respiratory:Present- Cough. Not Present- Shortness of breath with exertion, Shortness of breath at rest, Allergies, Coughing up blood and Chronic Cough. Cardiovascular:Not Present- Chest Pain, Racing/skipping heartbeats, Difficulty Breathing Lying Down, Murmur, Swelling and Palpitations. Gastrointestinal:Not Present- Bloody Stool, Heartburn, Abdominal Pain, Vomiting, Nausea, Constipation, Diarrhea, Difficulty Swallowing, Jaundice and Loss of appetitie. Male Genitourinary:Not Present- Urinary frequency, Blood in Urine, Weak urinary stream, Discharge, Flank Pain, Incontinence, Painful Urination, Urgency, Urinary Retention and Urinating at Night. Musculoskeletal:Present- Joint Pain, Morning Stiffness and Spasms. Not Present- Muscle Weakness, Muscle Pain, Joint Swelling and Back Pain. Neurological:Not Present- Tremor,  Dizziness, Blackout spells, Paralysis, Difficulty with balance and Weakness. Psychiatric:Present- Insomnia.   Vitals Weight: 180 lb Height: 67 in Body  Surface Area: 1.96 m Body Mass Index: 28.19 kg/m Pulse: 61 (Regular) Resp.: 18 (Unlabored) BP: 130/87 (Sitting, Left Arm, Standard)    Physical Exam General Mental Status - Alert, cooperative and good historian. General Appearance- pleasant. Not in acute distress. Orientation- Oriented X3. Build & Nutrition- Well nourished and Well developed. Head and Neck Head- normocephalic, atraumatic . Neck Global Assessment- supple. no bruit auscultated on the right and no bruit auscultated on the left. Eye Pupil- Bilateral- Normal. Motion- Bilateral- EOMI. Chest and Lung Exam Auscultation: Breath sounds:- clear at anterior chest wall and - clear at posterior chest wall. Adventitious sounds:- No Adventitious sounds. Cardiovascular Auscultation:Rhythm- Regular rate and rhythm. Heart Sounds- S1 WNL and S2 WNL. Murmurs & Other Heart Sounds:Auscultation of the heart reveals - No Murmurs. Abdomen Palpation/Percussion:Tenderness- Abdomen is non-tender to palpation. Rigidity (guarding)- Abdomen is soft. Auscultation:Auscultation of the abdomen reveals - Bowel sounds normal. Male Genitourinary Not done, not pertinent to present illness Peripheral Vascular Upper Extremity: Palpation:- Pulses bilaterally normal. Lower Extremity: Palpation:- Pulses bilaterally normal. Neurologic Examination of related systems reveals - normal muscle strength and tone in all extremities. Neurologic evaluation reveals - normal sensation and upper and lower extremity deep tendon reflexes intact bilaterally . Musculoskeletal Left knee shows no signs of infection. Incision healed. There is no swelling or warmth about the knee. Range of motion is about 10 to 60 degrees. Normal painless ROM in the right knee and both  hips.   RADIOGRAPHS: AP and lateral of the knee show severe tricompartmental arthritis.   Assessment & Plan Osteoarthritis, Knee (715.96) Left total knee arthoplasty      Dimitri Ped, PA-C

## 2011-07-03 ENCOUNTER — Encounter (HOSPITAL_COMMUNITY): Payer: Self-pay | Admitting: Pharmacy Technician

## 2011-07-06 ENCOUNTER — Encounter (HOSPITAL_COMMUNITY): Payer: Self-pay

## 2011-07-06 ENCOUNTER — Emergency Department (HOSPITAL_COMMUNITY)
Admission: EM | Admit: 2011-07-06 | Discharge: 2011-07-07 | Disposition: A | Discharge status: Home / Self Care | Payer: Medicare Other | Attending: Emergency Medicine | Admitting: Emergency Medicine

## 2011-07-06 ENCOUNTER — Emergency Department (HOSPITAL_COMMUNITY): Payer: Medicare Other

## 2011-07-06 DIAGNOSIS — I10 Essential (primary) hypertension: Secondary | ICD-10-CM | POA: Insufficient documentation

## 2011-07-06 DIAGNOSIS — J3489 Other specified disorders of nose and nasal sinuses: Secondary | ICD-10-CM | POA: Insufficient documentation

## 2011-07-06 DIAGNOSIS — H9209 Otalgia, unspecified ear: Secondary | ICD-10-CM | POA: Insufficient documentation

## 2011-07-06 DIAGNOSIS — Z79899 Other long term (current) drug therapy: Secondary | ICD-10-CM | POA: Insufficient documentation

## 2011-07-06 DIAGNOSIS — J40 Bronchitis, not specified as acute or chronic: Secondary | ICD-10-CM | POA: Diagnosis not present

## 2011-07-06 DIAGNOSIS — K219 Gastro-esophageal reflux disease without esophagitis: Secondary | ICD-10-CM | POA: Insufficient documentation

## 2011-07-06 DIAGNOSIS — Z9889 Other specified postprocedural states: Secondary | ICD-10-CM | POA: Insufficient documentation

## 2011-07-06 DIAGNOSIS — R5381 Other malaise: Secondary | ICD-10-CM | POA: Diagnosis not present

## 2011-07-06 DIAGNOSIS — R51 Headache: Secondary | ICD-10-CM | POA: Insufficient documentation

## 2011-07-06 DIAGNOSIS — IMO0001 Reserved for inherently not codable concepts without codable children: Secondary | ICD-10-CM | POA: Insufficient documentation

## 2011-07-06 DIAGNOSIS — M129 Arthropathy, unspecified: Secondary | ICD-10-CM | POA: Diagnosis not present

## 2011-07-06 DIAGNOSIS — R070 Pain in throat: Secondary | ICD-10-CM | POA: Diagnosis not present

## 2011-07-06 DIAGNOSIS — J209 Acute bronchitis, unspecified: Secondary | ICD-10-CM | POA: Diagnosis not present

## 2011-07-06 DIAGNOSIS — R509 Fever, unspecified: Secondary | ICD-10-CM | POA: Insufficient documentation

## 2011-07-06 DIAGNOSIS — R059 Cough, unspecified: Secondary | ICD-10-CM | POA: Diagnosis not present

## 2011-07-06 DIAGNOSIS — R05 Cough: Secondary | ICD-10-CM | POA: Insufficient documentation

## 2011-07-06 DIAGNOSIS — R220 Localized swelling, mass and lump, head: Secondary | ICD-10-CM | POA: Insufficient documentation

## 2011-07-06 HISTORY — DX: Pneumonia, unspecified organism: J18.9

## 2011-07-06 NOTE — ED Notes (Signed)
Pt alert, nad, c/o cough and head ahce, onset a week ago, pt has non productive cough in room, resp even unlabored, skin pwd

## 2011-07-06 NOTE — ED Notes (Signed)
Pt presents with no acute distress.  Pt family member here with the same

## 2011-07-07 MED ORDER — BENZONATATE 100 MG PO CAPS: 100.0000 mg | ORAL_CAPSULE | Freq: Three times a day (TID) | ORAL | Status: AC

## 2011-07-07 MED ORDER — AZITHROMYCIN 250 MG PO TABS: 250.0000 mg | ORAL_TABLET | Freq: Every day | ORAL | Status: AC

## 2011-07-07 MED ORDER — PSEUDOEPHEDRINE HCL 30 MG PO TABS: 30.0000 mg | ORAL_TABLET | ORAL | Status: AC | PRN
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
## 2011-07-07 NOTE — ED Provider Notes (Signed)
Medical screening examination/treatment/procedure(s) were performed by non-physician practitioner and as supervising physician I was immediately available for consultation/collaboration.  Ariella Voit R Lamia Mariner, MD 07/07/11 2340 

## 2011-07-07 NOTE — Discharge Instructions (Signed)
Your x-ray appears normal. I think you may have bronchitis. Rest. Drink plenty of fluids. Tylenol for pain and fever. Take zithromax as prescribed until all gone for the infection. Take tessalon perles for cough. Take sudafed for congestion. Follow up with a primary care doctor in the next two days.   Bronchitis Bronchitis is the body's way of reacting to injury and/or infection (inflammation) of the bronchi. Bronchi are the air tubes that extend from the windpipe into the lungs. If the inflammation becomes severe, it may cause shortness of breath. CAUSES  Inflammation may be caused by:  A virus.   Germs (bacteria).   Dust.   Allergens.   Pollutants and many other irritants.  The cells lining the bronchial tree are covered with tiny hairs (cilia). These constantly beat upward, away from the lungs, toward the mouth. This keeps the lungs free of pollutants. When these cells become too irritated and are unable to do their job, mucus begins to develop. This causes the characteristic cough of bronchitis. The cough clears the lungs when the cilia are unable to do their job. Without either of these protective mechanisms, the mucus would settle in the lungs. Then you would develop pneumonia. Smoking is a common cause of bronchitis and can contribute to pneumonia. Stopping this habit is the single most important thing you can do to help yourself. TREATMENT   Your caregiver may prescribe an antibiotic if the cough is caused by bacteria. Also, medicines that open up your airways make it easier to breathe. Your caregiver may also recommend or prescribe an expectorant. It will loosen the mucus to be coughed up. Only take over-the-counter or prescription medicines for pain, discomfort, or fever as directed by your caregiver.   Removing whatever causes the problem (smoking, for example) is critical to preventing the problem from getting worse.   Cough suppressants may be prescribed for relief of cough  symptoms.   Inhaled medicines may be prescribed to help with symptoms now and to help prevent problems from returning.   For those with recurrent (chronic) bronchitis, there may be a need for steroid medicines.  SEEK IMMEDIATE MEDICAL CARE IF:   During treatment, you develop more pus-like mucus (purulent sputum).   You have a fever.   Your baby is older than 3 months with a rectal temperature of 102 F (38.9 C) or higher.   Your baby is 35 months old or younger with a rectal temperature of 100.4 F (38 C) or higher.   You become progressively more ill.   You have increased difficulty breathing, wheezing, or shortness of breath.  It is necessary to seek immediate medical care if you are elderly or sick from any other disease. MAKE SURE YOU:   Understand these instructions.   Will watch your condition.   Will get help right away if you are not doing well or get worse.  Document Released: 02/03/2005 Document Revised: 01/23/2011 Document Reviewed: 12/14/2007 [facility] Patient Information 2012 [place], [place].

## 2011-07-08 ENCOUNTER — Encounter (HOSPITAL_COMMUNITY): Payer: Self-pay

## 2011-07-08 ENCOUNTER — Encounter (HOSPITAL_COMMUNITY)
Admission: RE | Admit: 2011-07-08 | Discharge: 2011-07-08 | Disposition: A | Discharge status: Home / Self Care | Payer: Medicare Other | Source: Ambulatory Visit | Attending: Orthopedic Surgery | Admitting: Orthopedic Surgery

## 2011-07-08 LAB — SURGICAL PCR SCREEN: MRSA, PCR: NEGATIVE

## 2011-07-08 LAB — COMPREHENSIVE METABOLIC PANEL
ALT: 11 U/L (ref 0–53)
AST: 15 U/L (ref 0–37)
Albumin: 3.4 g/dL — ABNORMAL LOW (ref 3.5–5.2)
Calcium: 8.5 mg/dL (ref 8.4–10.5)
Creatinine, Ser: 1.28 mg/dL (ref 0.50–1.35)
GFR calc non Af Amer: 63 mL/min — ABNORMAL LOW (ref >90)
Sodium: 138 mEq/L (ref 135–145)
Total Protein: 7 g/dL (ref 6.0–8.3)

## 2011-07-08 LAB — PROTIME-INR: INR: 0.95 (ref 0.00–1.49)

## 2011-07-08 LAB — URINALYSIS, ROUTINE W REFLEX MICROSCOPIC
Glucose, UA: NEGATIVE mg/dL
Ketones, ur: NEGATIVE mg/dL
Leukocytes, UA: NEGATIVE
Nitrite: NEGATIVE
pH: 5.5 (ref 5.0–8.0)

## 2011-07-08 LAB — CBC
MCH: 29.1 pg (ref 26.0–34.0)
MCHC: 33.5 g/dL (ref 30.0–36.0)
MCV: 86.8 fL (ref 78.0–100.0)
Platelets: 243 10*3/uL (ref 150–400)
RBC: 4.61 MIL/uL (ref 4.22–5.81)
RDW: 13.1 % (ref 11.5–15.5)

## 2011-07-08 MED ORDER — CHLORHEXIDINE GLUCONATE 4 % EX LIQD: 60.0000 mL | Freq: Once | CUTANEOUS | Status: DC

## 2011-07-08 NOTE — Patient Instructions (Signed)
20 Gregory [name]  07/08/2011   Your procedure is scheduled on:  07-16-2011  Report to [facility] at 0645  AM.  Call this number if you have problems the morning of surgery: [phone]   Remember:   Do not eat food or drink liquids:After Midnight.  .  Take these medicines the morning of surgery with A SIP OF WATER: certizine, dexilant   Do not wear jewelry or make up.  Do not wear lotions, powders, or perfumes.Do not wear deodorant.    Do not bring valuables to the hospital.  Contacts, dentures or bridgework may not be worn into surgery.  Leave suitcase in the car. After surgery it may be brought to your room.  For patients admitted to the hospital, checkout time is 11:00 AM the day of discharge.     Special Instructions: CHG Shower Use Special Wash: 1/2 bottle night before surgery and 1/2 bottle morning of surgery.neck down avoid private area   Please read over the following fact sheets that you were given: MRSA Information, incentive spirometer  [name] [facility] pre op nurse phone number [phone], call if needed

## 2011-07-08 NOTE — Pre-Procedure Instructions (Signed)
Blood refusal form faxed to [facility] and dr Lequita Halt, fax confirmation received and placed on pt chart.

## 2011-07-08 NOTE — Pre-Procedure Instructions (Signed)
ekg 03-14-2011 on chart and in epic chest 2 view xray 07-06-2011 on chart and in epic Sleep study 03-14-2009 on chart  Stress tets 03-09-2009 on chrt and in epic

## 2011-07-15 MED ORDER — BUPIVACAINE 0.25 % ON-Q PUMP SINGLE CATH 300ML
300.0000 mL | INJECTION | Status: DC
  Filled 2011-07-15: qty 300

## 2011-07-16 ENCOUNTER — Inpatient Hospital Stay (HOSPITAL_COMMUNITY)
Admission: RE | Admit: 2011-07-16 | Discharge: 2011-07-20 | DRG: 470 | Disposition: A | Discharge status: Home Health | Payer: Medicare Other | Source: Ambulatory Visit | Attending: Orthopedic Surgery | Admitting: Orthopedic Surgery

## 2011-07-16 ENCOUNTER — Encounter (HOSPITAL_COMMUNITY): Payer: Self-pay | Admitting: *Deleted

## 2011-07-16 ENCOUNTER — Encounter (HOSPITAL_COMMUNITY): Payer: Self-pay | Admitting: Anesthesiology

## 2011-07-16 ENCOUNTER — Encounter (HOSPITAL_COMMUNITY)
Admission: RE | Disposition: A | Discharge status: Home Health | Payer: Self-pay | Source: Ambulatory Visit | Attending: Orthopedic Surgery

## 2011-07-16 ENCOUNTER — Ambulatory Visit (HOSPITAL_COMMUNITY): Payer: Medicare Other | Admitting: Anesthesiology

## 2011-07-16 DIAGNOSIS — K219 Gastro-esophageal reflux disease without esophagitis: Secondary | ICD-10-CM | POA: Diagnosis not present

## 2011-07-16 DIAGNOSIS — M1710 Unilateral primary osteoarthritis, unspecified knee: Secondary | ICD-10-CM | POA: Diagnosis not present

## 2011-07-16 DIAGNOSIS — Z01812 Encounter for preprocedural laboratory examination: Secondary | ICD-10-CM

## 2011-07-16 DIAGNOSIS — I10 Essential (primary) hypertension: Secondary | ICD-10-CM | POA: Diagnosis not present

## 2011-07-16 DIAGNOSIS — G4733 Obstructive sleep apnea (adult) (pediatric): Secondary | ICD-10-CM | POA: Diagnosis not present

## 2011-07-16 DIAGNOSIS — M12569 Traumatic arthropathy, unspecified knee: Principal | ICD-10-CM | POA: Diagnosis present

## 2011-07-16 DIAGNOSIS — M25569 Pain in unspecified knee: Secondary | ICD-10-CM | POA: Diagnosis not present

## 2011-07-16 DIAGNOSIS — M179 Osteoarthritis of knee, unspecified: Secondary | ICD-10-CM | POA: Diagnosis present

## 2011-07-16 DIAGNOSIS — IMO0002 Reserved for concepts with insufficient information to code with codable children: Secondary | ICD-10-CM | POA: Diagnosis not present

## 2011-07-16 DIAGNOSIS — G4710 Hypersomnia, unspecified: Secondary | ICD-10-CM | POA: Diagnosis not present

## 2011-07-16 DIAGNOSIS — Z96659 Presence of unspecified artificial knee joint: Secondary | ICD-10-CM

## 2011-07-16 HISTORY — PX: TOTAL KNEE ARTHROPLASTY: SHX125

## 2011-07-16 SURGERY — ARTHROPLASTY, KNEE, TOTAL
Anesthesia: Spinal | Site: Knee | Laterality: Left | Wound class: Clean

## 2011-07-16 MED ORDER — BUPIVACAINE HCL 0.75 % IJ SOLN
INTRAMUSCULAR | Status: DC | PRN
  Administered 2011-07-16: 1.8 mL via INTRATHECAL

## 2011-07-16 MED ORDER — TRAZODONE HCL 50 MG PO TABS
50.0000 mg | ORAL_TABLET | Freq: Every day | ORAL | Status: DC
  Administered 2011-07-16 – 2011-07-19 (×4): 50 mg via ORAL
  Filled 2011-07-16 (×5): qty 1

## 2011-07-16 MED ORDER — FENTANYL CITRATE 0.05 MG/ML IJ SOLN: 25.0000 ug | INTRAMUSCULAR | Status: DC | PRN

## 2011-07-16 MED ORDER — LACTATED RINGERS IV SOLN
INTRAVENOUS | Status: DC
  Administered 2011-07-16 (×2): via INTRAVENOUS
  Administered 2011-07-16: 1000 mL via INTRAVENOUS

## 2011-07-16 MED ORDER — ACETAMINOPHEN 10 MG/ML IV SOLN
1000.0000 mg | Freq: Four times a day (QID) | INTRAVENOUS | Status: AC
  Administered 2011-07-16 – 2011-07-17 (×4): 1000 mg via INTRAVENOUS
  Filled 2011-07-16 (×5): qty 100

## 2011-07-16 MED ORDER — ONDANSETRON HCL 4 MG/2ML IJ SOLN: 4.0000 mg | Freq: Four times a day (QID) | INTRAMUSCULAR | Status: DC | PRN

## 2011-07-16 MED ORDER — DOCUSATE SODIUM 100 MG PO CAPS
100.0000 mg | ORAL_CAPSULE | Freq: Two times a day (BID) | ORAL | Status: DC
  Administered 2011-07-16 – 2011-07-20 (×8): 100 mg via ORAL

## 2011-07-16 MED ORDER — ACETAMINOPHEN 650 MG RE SUPP: 650.0000 mg | Freq: Four times a day (QID) | RECTAL | Status: DC | PRN

## 2011-07-16 MED ORDER — OLANZAPINE 2.5 MG PO TABS
2.5000 mg | ORAL_TABLET | Freq: Every day | ORAL | Status: DC
  Administered 2011-07-16 – 2011-07-19 (×4): 2.5 mg via ORAL
  Filled 2011-07-16 (×5): qty 1

## 2011-07-16 MED ORDER — BISACODYL 10 MG RE SUPP: 10.0000 mg | Freq: Every day | RECTAL | Status: DC | PRN

## 2011-07-16 MED ORDER — MENTHOL 3 MG MT LOZG
1.0000 | LOZENGE | OROMUCOSAL | Status: DC | PRN
  Filled 2011-07-16: qty 9

## 2011-07-16 MED ORDER — PROMETHAZINE HCL 25 MG/ML IJ SOLN: 6.2500 mg | INTRAMUSCULAR | Status: DC | PRN

## 2011-07-16 MED ORDER — TEMAZEPAM 15 MG PO CAPS
15.0000 mg | ORAL_CAPSULE | Freq: Every evening | ORAL | Status: DC | PRN
  Administered 2011-07-18 (×2): 30 mg via ORAL
  Filled 2011-07-16 (×2): qty 2

## 2011-07-16 MED ORDER — TOPIRAMATE 100 MG PO TABS: 200.0000 mg | ORAL_TABLET | Freq: Every evening | ORAL | Status: DC | PRN

## 2011-07-16 MED ORDER — METOCLOPRAMIDE HCL 5 MG/ML IJ SOLN: 5.0000 mg | Freq: Three times a day (TID) | INTRAMUSCULAR | Status: DC | PRN

## 2011-07-16 MED ORDER — ACETAMINOPHEN 10 MG/ML IV SOLN
INTRAVENOUS | Status: AC
  Filled 2011-07-16: qty 100

## 2011-07-16 MED ORDER — METHOCARBAMOL 100 MG/ML IJ SOLN
500.0000 mg | Freq: Four times a day (QID) | INTRAVENOUS | Status: DC | PRN
  Administered 2011-07-16: 500 mg via INTRAVENOUS
  Filled 2011-07-16: qty 5

## 2011-07-16 MED ORDER — BUPIVACAINE ON-Q PAIN PUMP (FOR ORDER SET NO CHG)
INJECTION | Status: DC
  Filled 2011-07-16: qty 1

## 2011-07-16 MED ORDER — METOCLOPRAMIDE HCL 10 MG PO TABS: 5.0000 mg | ORAL_TABLET | Freq: Three times a day (TID) | ORAL | Status: DC | PRN

## 2011-07-16 MED ORDER — RIVAROXABAN 10 MG PO TABS
10.0000 mg | ORAL_TABLET | Freq: Every day | ORAL | Status: DC
  Administered 2011-07-17 – 2011-07-20 (×4): 10 mg via ORAL
  Filled 2011-07-16 (×5): qty 1

## 2011-07-16 MED ORDER — KETAMINE HCL 10 MG/ML IJ SOLN
INTRAMUSCULAR | Status: DC | PRN
  Administered 2011-07-16 (×2): 10 mg via INTRAVENOUS

## 2011-07-16 MED ORDER — DEXTROSE-NACL 5-0.9 % IV SOLN
INTRAVENOUS | Status: DC
  Administered 2011-07-16 – 2011-07-17 (×2): via INTRAVENOUS

## 2011-07-16 MED ORDER — CEFAZOLIN SODIUM 1-5 GM-% IV SOLN
1.0000 g | Freq: Four times a day (QID) | INTRAVENOUS | Status: AC
  Administered 2011-07-16 – 2011-07-17 (×3): 1 g via INTRAVENOUS
  Filled 2011-07-16 (×3): qty 50

## 2011-07-16 MED ORDER — MIDAZOLAM HCL 5 MG/5ML IJ SOLN
INTRAMUSCULAR | Status: DC | PRN
  Administered 2011-07-16 (×2): 1 mg via INTRAVENOUS

## 2011-07-16 MED ORDER — PROPOFOL 10 MG/ML IV EMUL
INTRAVENOUS | Status: DC | PRN
  Administered 2011-07-16: 25 ug/kg/min via INTRAVENOUS

## 2011-07-16 MED ORDER — CEFAZOLIN SODIUM-DEXTROSE 2-3 GM-% IV SOLR
INTRAVENOUS | Status: AC
Start: 2011-07-16 — End: 2011-07-16
  Filled 2011-07-16: qty 50

## 2011-07-16 MED ORDER — TOPIRAMATE 100 MG PO TABS
200.0000 mg | ORAL_TABLET | Freq: Every evening | ORAL | Status: DC | PRN
  Filled 2011-07-16: qty 2

## 2011-07-16 MED ORDER — BUPIVACAINE 0.25 % ON-Q PUMP SINGLE CATH 300ML
INJECTION | Status: DC | PRN
  Administered 2011-07-16: 300 mL

## 2011-07-16 MED ORDER — METHOCARBAMOL 500 MG PO TABS
500.0000 mg | ORAL_TABLET | Freq: Four times a day (QID) | ORAL | Status: DC | PRN
  Administered 2011-07-16 – 2011-07-19 (×7): 500 mg via ORAL
  Filled 2011-07-16 (×7): qty 1

## 2011-07-16 MED ORDER — DIPHENHYDRAMINE HCL 12.5 MG/5ML PO ELIX
12.5000 mg | ORAL_SOLUTION | ORAL | Status: DC | PRN
  Administered 2011-07-16: 25 mg via ORAL
  Administered 2011-07-17: 12.5 mg via ORAL
  Administered 2011-07-19 – 2011-07-20 (×2): 25 mg via ORAL
  Filled 2011-07-16: qty 5
  Filled 2011-07-16 (×3): qty 10

## 2011-07-16 MED ORDER — CEFAZOLIN SODIUM-DEXTROSE 2-3 GM-% IV SOLR
2.0000 g | INTRAVENOUS | Status: AC
  Administered 2011-07-16: 2 g via INTRAVENOUS

## 2011-07-16 MED ORDER — PSEUDOEPHEDRINE HCL 30 MG PO TABS
30.0000 mg | ORAL_TABLET | ORAL | Status: DC | PRN
  Filled 2011-07-16: qty 1

## 2011-07-16 MED ORDER — 0.9 % SODIUM CHLORIDE (POUR BTL) OPTIME
TOPICAL | Status: DC | PRN
  Administered 2011-07-16: 1000 mL

## 2011-07-16 MED ORDER — ACETAMINOPHEN 10 MG/ML IV SOLN
1000.0000 mg | Freq: Once | INTRAVENOUS | Status: AC
  Administered 2011-07-16: 1000 mg via INTRAVENOUS
  Filled 2011-07-16: qty 100

## 2011-07-16 MED ORDER — KETOROLAC TROMETHAMINE 30 MG/ML IJ SOLN: 15.0000 mg | Freq: Once | INTRAMUSCULAR | Status: DC | PRN

## 2011-07-16 MED ORDER — ONDANSETRON HCL 4 MG/2ML IJ SOLN
INTRAMUSCULAR | Status: DC | PRN
  Administered 2011-07-16: 4 mg via INTRAVENOUS

## 2011-07-16 MED ORDER — FLEET ENEMA 7-19 GM/118ML RE ENEM: 1.0000 | ENEMA | Freq: Once | RECTAL | Status: AC | PRN

## 2011-07-16 MED ORDER — DIPHENHYDRAMINE HCL 50 MG/ML IJ SOLN
12.5000 mg | Freq: Four times a day (QID) | INTRAMUSCULAR | Status: DC
  Administered 2011-07-16 – 2011-07-18 (×8): 12.5 mg via INTRAVENOUS
  Filled 2011-07-16: qty 0.25
  Filled 2011-07-16: qty 1
  Filled 2011-07-16 (×6): qty 0.25
  Filled 2011-07-16 (×2): qty 1
  Filled 2011-07-16 (×2): qty 0.25
  Filled 2011-07-16 (×2): qty 1
  Filled 2011-07-16 (×7): qty 0.25

## 2011-07-16 MED ORDER — POLYETHYLENE GLYCOL 3350 17 G PO PACK: 17.0000 g | PACK | Freq: Every day | ORAL | Status: DC | PRN

## 2011-07-16 MED ORDER — FENTANYL CITRATE 0.05 MG/ML IJ SOLN
INTRAMUSCULAR | Status: DC | PRN
  Administered 2011-07-16: 100 ug via INTRAVENOUS

## 2011-07-16 MED ORDER — SODIUM CHLORIDE 0.9 % IV SOLN: INTRAVENOUS | Status: DC

## 2011-07-16 MED ORDER — ACETAMINOPHEN 325 MG PO TABS
650.0000 mg | ORAL_TABLET | Freq: Four times a day (QID) | ORAL | Status: DC | PRN
  Administered 2011-07-19: 650 mg via ORAL
  Filled 2011-07-16: qty 2

## 2011-07-16 MED ORDER — OXYCODONE HCL 10 MG PO TB12
10.0000 mg | ORAL_TABLET | Freq: Two times a day (BID) | ORAL | Status: DC
  Administered 2011-07-16 – 2011-07-20 (×8): 10 mg via ORAL
  Filled 2011-07-16 (×8): qty 1

## 2011-07-16 MED ORDER — PHENOL 1.4 % MT LIQD
1.0000 | OROMUCOSAL | Status: DC | PRN
  Filled 2011-07-16: qty 177

## 2011-07-16 MED ORDER — PANTOPRAZOLE SODIUM 40 MG PO TBEC
80.0000 mg | DELAYED_RELEASE_TABLET | Freq: Two times a day (BID) | ORAL | Status: DC
  Administered 2011-07-16: 80 mg via ORAL
  Filled 2011-07-16 (×4): qty 2

## 2011-07-16 MED ORDER — LORATADINE 10 MG PO TABS
10.0000 mg | ORAL_TABLET | Freq: Every day | ORAL | Status: DC
  Administered 2011-07-17 – 2011-07-20 (×4): 10 mg via ORAL
  Filled 2011-07-16 (×4): qty 1

## 2011-07-16 MED ORDER — MORPHINE SULFATE 2 MG/ML IJ SOLN
1.0000 mg | INTRAMUSCULAR | Status: DC | PRN
  Administered 2011-07-16 (×2): 2 mg via INTRAVENOUS
  Administered 2011-07-17: 1 mg via INTRAVENOUS
  Filled 2011-07-16 (×3): qty 1

## 2011-07-16 MED ORDER — OXYCODONE HCL 5 MG PO TABS
5.0000 mg | ORAL_TABLET | ORAL | Status: DC | PRN
  Administered 2011-07-16: 20 mg via ORAL
  Administered 2011-07-16: 10 mg via ORAL
  Administered 2011-07-16 – 2011-07-17 (×3): 20 mg via ORAL
  Administered 2011-07-17: 15 mg via ORAL
  Administered 2011-07-17 – 2011-07-18 (×4): 20 mg via ORAL
  Administered 2011-07-18: 10 mg via ORAL
  Administered 2011-07-18 – 2011-07-20 (×6): 20 mg via ORAL
  Filled 2011-07-16: qty 4
  Filled 2011-07-16: qty 2
  Filled 2011-07-16 (×2): qty 4
  Filled 2011-07-16: qty 2
  Filled 2011-07-16 (×13): qty 4

## 2011-07-16 MED ORDER — BUPIVACAINE 0.25 % ON-Q PUMP SINGLE CATH 300ML
INJECTION | Status: AC
  Filled 2011-07-16: qty 300

## 2011-07-16 MED ORDER — FLUOXETINE HCL 20 MG PO CAPS
20.0000 mg | ORAL_CAPSULE | Freq: Every day | ORAL | Status: DC
  Administered 2011-07-16 – 2011-07-19 (×4): 20 mg via ORAL
  Filled 2011-07-16 (×5): qty 1

## 2011-07-16 MED ORDER — DEXAMETHASONE SODIUM PHOSPHATE 10 MG/ML IJ SOLN
10.0000 mg | Freq: Once | INTRAMUSCULAR | Status: AC
  Administered 2011-07-16: 10 mg via INTRAVENOUS
  Filled 2011-07-16: qty 1

## 2011-07-16 MED ORDER — ONDANSETRON HCL 4 MG PO TABS: 4.0000 mg | ORAL_TABLET | Freq: Four times a day (QID) | ORAL | Status: DC | PRN

## 2011-07-16 MED ORDER — SODIUM CHLORIDE 0.9 % IR SOLN
Status: DC | PRN
  Administered 2011-07-16: 1000 mL

## 2011-07-16 SURGICAL SUPPLY — 64 items
ADAPTER BOLT FEMORAL +2/-2 (Knees) ×2 IMPLANT
ADPR FEM +2/-2 OFST BOLT (Knees) ×1 IMPLANT
ADPR FEM 5D STRL KN PFC SGM (Orthopedic Implant) ×1 IMPLANT
AUG FEM SZ5 8 STRL LF KN LT TI (Knees) ×1 IMPLANT
AUGMENT DISTAL LEFT 8 (Knees) IMPLANT
BAG SPEC THK2 15X12 ZIP CLS (MISCELLANEOUS) ×1
BAG ZIPLOCK 12X15 (MISCELLANEOUS) ×2 IMPLANT
BANDAGE ELASTIC 6 VELCRO ST LF (GAUZE/BANDAGES/DRESSINGS) ×2 IMPLANT
BANDAGE ESMARK 6X9 LF (GAUZE/BANDAGES/DRESSINGS) ×1 IMPLANT
BLADE SAG 18X100X1.27 (BLADE) ×2 IMPLANT
BLADE SAW SGTL 11.0X1.19X90.0M (BLADE) ×2 IMPLANT
BNDG CMPR 9X6 STRL LF SNTH (GAUZE/BANDAGES/DRESSINGS) ×1
BNDG ESMARK 6X9 LF (GAUZE/BANDAGES/DRESSINGS) ×2
BONE CEMENT GENTAMICIN (Cement) ×2 IMPLANT
BOWL SMART MIX CTS (DISPOSABLE) ×2 IMPLANT
CATH KIT ON-Q SILVERSOAK 5IN (CATHETERS) ×2 IMPLANT
CEMENT BONE GENTAMICIN 40 (Cement) IMPLANT
CEMENT HV SMART SET (Cement) ×4 IMPLANT
CLOTH BEACON ORANGE TIMEOUT ST (SAFETY) ×2 IMPLANT
CLSR STERI-STRIP ANTIMIC 1/2X4 (GAUZE/BANDAGES/DRESSINGS) ×1 IMPLANT
CUFF TOURN SGL QUICK 34 (TOURNIQUET CUFF) ×2
CUFF TRNQT CYL 34X4X40X1 (TOURNIQUET CUFF) ×1 IMPLANT
DISTAL AUG 8 LEFT (Knees) ×2 IMPLANT
DRAPE EXTREMITY T 121X128X90 (DRAPE) ×2 IMPLANT
DRAPE POUCH INSTRU U-SHP 10X18 (DRAPES) ×2 IMPLANT
DRAPE U-SHAPE 47X51 STRL (DRAPES) ×2 IMPLANT
DRSG ADAPTIC 3X8 NADH LF (GAUZE/BANDAGES/DRESSINGS) ×2 IMPLANT
DRSG PAD ABDOMINAL 8X10 ST (GAUZE/BANDAGES/DRESSINGS) ×1 IMPLANT
DURAPREP 26ML APPLICATOR (WOUND CARE) ×2 IMPLANT
ELECT REM PT RETURN 9FT ADLT (ELECTROSURGICAL) ×2
ELECTRODE REM PT RTRN 9FT ADLT (ELECTROSURGICAL) ×1 IMPLANT
EVACUATOR 1/8 PVC DRAIN (DRAIN) ×3 IMPLANT
FACESHIELD LNG OPTICON STERILE (SAFETY) ×12 IMPLANT
FEMORAL ADAPTER (Orthopedic Implant) ×2 IMPLANT
GLOVE BIO SURGEON STRL SZ7.5 (GLOVE) ×2 IMPLANT
GLOVE BIO SURGEON STRL SZ8 (GLOVE) ×3 IMPLANT
GLOVE BIOGEL PI IND STRL 8 (GLOVE) ×2 IMPLANT
GLOVE BIOGEL PI INDICATOR 8 (GLOVE) ×3
GOWN STRL NON-REIN LRG LVL3 (GOWN DISPOSABLE) ×6 IMPLANT
GOWN STRL REIN XL XLG (GOWN DISPOSABLE) ×3 IMPLANT
HANDPIECE INTERPULSE COAX TIP (DISPOSABLE) ×2
IMMOBILIZER KNEE 20 (SOFTGOODS) ×2
IMMOBILIZER KNEE 20 THIGH 36 (SOFTGOODS) ×1 IMPLANT
KIT BASIN OR (CUSTOM PROCEDURE TRAY) ×2 IMPLANT
MANIFOLD NEPTUNE II (INSTRUMENTS) ×2 IMPLANT
NS IRRIG 1000ML POUR BTL (IV SOLUTION) ×2 IMPLANT
PACK TOTAL JOINT (CUSTOM PROCEDURE TRAY) ×2 IMPLANT
PADDING CAST COTTON 6X4 STRL (CAST SUPPLIES) ×4 IMPLANT
POSITIONER SURGICAL ARM (MISCELLANEOUS) ×2 IMPLANT
SET HNDPC FAN SPRY TIP SCT (DISPOSABLE) ×1 IMPLANT
SPONGE GAUZE 4X4 12PLY (GAUZE/BANDAGES/DRESSINGS) ×2 IMPLANT
STAPLER VISISTAT 35W (STAPLE) ×1 IMPLANT
STRIP CLOSURE SKIN 1/2X4 (GAUZE/BANDAGES/DRESSINGS) ×4 IMPLANT
SUCTION FRAZIER 12FR DISP (SUCTIONS) ×2 IMPLANT
SUT MNCRL AB 4-0 PS2 18 (SUTURE) ×2 IMPLANT
SUT PDS AB 1 CT1 27 (SUTURE) ×2 IMPLANT
SUT VIC AB 2-0 CT1 27 (SUTURE) ×6
SUT VIC AB 2-0 CT1 TAPERPNT 27 (SUTURE) ×3 IMPLANT
SUT VLOC 180 0 24IN GS25 (SUTURE) ×4 IMPLANT
TOWEL OR 17X26 10 PK STRL BLUE (TOWEL DISPOSABLE) ×4 IMPLANT
TRAY FOLEY CATH 14FRSI W/METER (CATHETERS) ×2 IMPLANT
WATER STERILE IRR 1500ML POUR (IV SOLUTION) ×2 IMPLANT
WRAP KNEE MAXI GEL POST OP (GAUZE/BANDAGES/DRESSINGS) ×3 IMPLANT
YANKAUER SUCT BULB TIP NO VENT (SUCTIONS) ×1 IMPLANT

## 2011-07-16 NOTE — Anesthesia Procedure Notes (Signed)
Spinal  Patient location during procedure: OR Staffing Performed by: anesthesiologist  Preanesthetic Checklist Completed: patient identified, site marked, surgical consent, pre-op evaluation, timeout performed, IV checked, risks and benefits discussed and monitors and equipment checked Spinal Block Patient position: sitting Prep: Betadine Patient monitoring: heart rate, continuous pulse ox and blood pressure Injection technique: single-shot Needle Needle type: Sprotte  Needle gauge: 24 G Needle length: 9 cm Additional Notes Expiration date of kit checked and confirmed. Patient tolerated procedure well, without complications.     

## 2011-07-16 NOTE — Plan of Care (Signed)
Problem: Phase I Progression Outcomes Goal: Pain controlled with appropriate interventions Outcome: Not Met (add Reason) Pt still c/o pain and muscle spasms periodically.

## 2011-07-16 NOTE — Transfer of Care (Signed)
Immediate Anesthesia Transfer of Care Note  [name]  Procedure(s) Performed: Procedure(s) (LRB): TOTAL KNEE ARTHROPLASTY (Left)  Patient Location: PACU  Anesthesia Type: Spinal  Level of Consciousness: awake, alert  and oriented  Airway & Oxygen Therapy: Patient Spontanous Breathing and Patient connected to face mask oxygen  Post-op Assessment: Report given to PACU RN and Post -op Vital signs reviewed and stable  Post vital signs: Reviewed and stable  Complications: No apparent anesthesia complications

## 2011-07-16 NOTE — Anesthesia Preprocedure Evaluation (Signed)
Anesthesia Evaluation  Patient identified by MRN, date of birth, ID band Patient awake    Reviewed: Allergy & Precautions, H&P , NPO status , Patient's Chart, lab work & pertinent test results  Airway Mallampati: II TM Distance: >3 FB Neck ROM: Full    Dental No notable dental hx.    Pulmonary sleep apnea ,  Severe osa no cpap breath sounds clear to auscultation  Pulmonary exam normal       Cardiovascular hypertension, Pt. on medications negative cardio ROS  Rhythm:Regular Rate:Normal     Neuro/Psych negative neurological ROS  negative psych ROS   GI/Hepatic negative GI ROS, Neg liver ROS,   Endo/Other  negative endocrine ROS  Renal/GU negative Renal ROS  negative genitourinary   Musculoskeletal negative musculoskeletal ROS (+)   Abdominal   Peds negative pediatric ROS (+)  Hematology negative hematology ROS (+)   Anesthesia Other Findings   Reproductive/Obstetrics negative OB ROS                           Anesthesia Physical Anesthesia Plan  ASA: III  Anesthesia Plan: Spinal   Post-op Pain Management:    Induction: Intravenous  Airway Management Planned: Simple Face Mask  Additional Equipment:   Intra-op Plan:   Post-operative Plan:   Informed Consent: I have reviewed the patients History and Physical, chart, labs and discussed the procedure including the risks, benefits and alternatives for the proposed anesthesia with the patient or authorized representative who has indicated his/her understanding and acceptance.     Plan Discussed with: CRNA  Anesthesia Plan Comments:         Anesthesia Quick Evaluation

## 2011-07-16 NOTE — Plan of Care (Signed)
Problem: Phase I Progression Outcomes Goal: Dangle evening of surgery Outcome: Not Met (add Reason) Pt unable to dangle d/t pain.

## 2011-07-16 NOTE — Anesthesia Postprocedure Evaluation (Signed)
  Anesthesia Post-op Note  [name]  Procedure(s) Performed: Procedure(s) (LRB): TOTAL KNEE ARTHROPLASTY (Left)  Patient Location: PACU  Anesthesia Type: Spinal  Level of Consciousness: awake and alert   Airway and Oxygen Therapy: Patient Spontanous Breathing  Post-op Pain: mild  Post-op Assessment: Post-op Vital signs reviewed, Patient's Cardiovascular Status Stable, Respiratory Function Stable, Patent Airway and No signs of Nausea or vomiting  Post-op Vital Signs: stable  Complications: No apparent anesthesia complications

## 2011-07-16 NOTE — Brief Op Note (Signed)
07/16/2011  11:38 AM  PATIENT:  Gregory [name]  51 y.o. male  PRE-OPERATIVE DIAGNOSIS:  osteoarthritis of left knee  POST-OPERATIVE DIAGNOSIS:  osteoarthritis of left knee  PROCEDURE:  Procedure(s) (LRB): TOTAL KNEE ARTHROPLASTY (Left)  SURGEON:  Surgeon(s) and Role:    * Loanne Drilling, MD - Primary  PHYSICIAN ASSISTANT:   ASSISTANTS: Avel Peace, PA-C   ANESTHESIA:   spinal  EBL:  Total I/O In: 2000 [I.V.:2000] Out: 635 [Urine:485; Blood:150]  BLOOD ADMINISTERED:none  DRAINS: (2 medium) Hemovact drain(s) in the left knee joint and subQ tissue with  Suction Open   LOCAL MEDICATIONS USED:  MARCAINE     SPECIMEN:  No Specimen  DISPOSITION OF SPECIMEN:  N/A  COUNTS:  YES  TOURNIQUET:   Total Tourniquet Time Documented: Thigh (Left) - 90 minutes  DICTATION: .Other Dictation: Dictation Number [id number]  PLAN OF CARE: Admit to inpatient   PATIENT DISPOSITION:  PACU - hemodynamically stable.   Gregory Rankin [name], MD    07/16/2011, 11:49 AM

## 2011-07-16 NOTE — Interval H&P Note (Signed)
History and Physical Interval Note:  07/16/2011 8:49 AM  Gregory [name]  has presented today for surgery, with the diagnosis of osteoarthritis of left knee  The various methods of treatment have been discussed with the patient and family. After consideration of risks, benefits and other options for treatment, the patient has consented to  Procedure(s) (LRB): TOTAL KNEE ARTHROPLASTY (Left) as a surgical intervention .  The patients' history has been reviewed, patient examined, no change in status, stable for surgery.  I have reviewed the patients' chart and labs.  Questions were answered to the patient's satisfaction.     Loanne Drilling

## 2011-07-17 LAB — BASIC METABOLIC PANEL
BUN: 11 mg/dL (ref 6–23)
Calcium: 8 mg/dL — ABNORMAL LOW (ref 8.4–10.5)
Chloride: 103 mEq/L (ref 96–112)
Creatinine, Ser: 1.17 mg/dL (ref 0.50–1.35)
GFR calc Af Amer: 81 mL/min — ABNORMAL LOW (ref >90)

## 2011-07-17 LAB — CBC
Hemoglobin: 10.5 g/dL — ABNORMAL LOW (ref 13.0–17.0)
RBC: 3.63 MIL/uL — ABNORMAL LOW (ref 4.22–5.81)

## 2011-07-17 MED ORDER — NON FORMULARY: 60.0000 mg | Freq: Every day | Status: DC

## 2011-07-17 MED ORDER — POLYSACCHARIDE IRON COMPLEX 150 MG PO CAPS
150.0000 mg | ORAL_CAPSULE | Freq: Every day | ORAL | Status: DC
  Administered 2011-07-17 – 2011-07-20 (×4): 150 mg via ORAL
  Filled 2011-07-17 (×4): qty 1

## 2011-07-17 MED ORDER — DEXLANSOPRAZOLE 60 MG PO CPDR
60.0000 mg | DELAYED_RELEASE_CAPSULE | Freq: Every day | ORAL | Status: DC
  Administered 2011-07-17 – 2011-07-20 (×4): 60 mg via ORAL
  Filled 2011-07-17 (×4): qty 1

## 2011-07-17 NOTE — Progress Notes (Signed)
Physical Therapy Treatment Patient Details Name: Gregory [name] MRN: [id number] DOB: Jul 15, 1960 Today's Date: 07/17/2011 Time: 4098-1191 PT Time Calculation (min): 22 min  PT Assessment / Plan / Recommendation Comments on Treatment Session  pt again walked a long distance w/ no change in pain of 6/10. encouraged to limit the distance but pt insisted to walk .    Follow Up Recommendations  Home health PT    Barriers to Discharge        Equipment Recommendations  None recommended by PT    Recommendations for Other Services    Frequency 7X/week   Plan Discharge plan remains appropriate;Frequency remains appropriate    Precautions / Restrictions Precautions Precautions: Knee Required Braces or Orthoses:  (pt wanted to ambulate w/ no KI) Knee Immobilizer - Left: Discontinue once straight leg raise with < 10 degree lag Restrictions Weight Bearing Restrictions: No   Pertinent Vitals/Pain 6/10 L Knee    Mobility  Transfers Transfers: Sit to Stand;Stand to Sit Sit to Stand: 4: Min guard;From chair/3-in-1;With upper extremity assist Stand to Sit: 4: Min guard;To bed;With upper extremity assist Details for Transfer Assistance: VC for safety, to not stand w/out RW Ambulation/Gait Ambulation/Gait Assistance: 4: Min assist Ambulation Distance (Feet): 600 Feet Assistive device: Rolling walker Ambulation/Gait Assistance Details: Used L shoe  w/ lift and w/out KI . pt again requested to ambulate  the 600 feet,. Gait Pattern: Step-through pattern;Decreased stance time - left    Exercises     PT Diagnosis:    PT Problem List:   PT Treatment Interventions:     PT Goals Acute Rehab PT Goals Pt will go Sit to Stand: with supervision PT Goal: Sit to Stand - Progress: Progressing toward goal Pt will go Stand to Sit: with supervision PT Goal: Stand to Sit - Progress: Progressing toward goal Pt will Ambulate: >150 feet;with supervision;with rolling walker PT Goal: Ambulate - Progress:  Progressing toward goal Pt will Go Up / Down Stairs: Flight;with min assist;with least restrictive assistive device PT Goal: Up/Down Stairs - Progress: Progressing toward goal  Visit Information  Last PT Received On: 07/17/11 Assistance Needed: +1    Subjective Data  Subjective: i want to keep walking (inspite of PT encouraging to not walk so far.)   Cognition  Overall Cognitive Status: Appears within functional limits for tasks assessed/performed Arousal/Alertness: Awake/alert Orientation Level: Appears intact for tasks assessed Behavior During Session: Other (comment) (impulsive) Cognition - Other Comments: Pt does hava a h/o TBI.    Balance     End of Session PT - End of Session Activity Tolerance: Patient tolerated treatment well Patient left:  (left w/ OT)    Rada Hay 07/17/2011, 4:46 PM [phone]

## 2011-07-17 NOTE — Op Note (Signed)
[name], NEIS NO.:  [id number]  MEDICAL RECORD NO.:  [id number]  LOCATION:  1621                         FACILITY:  [facility]  PHYSICIAN:  Ollen Gross, M.D.    DATE OF BIRTH:  [birth year]  DATE OF PROCEDURE: DATE OF DISCHARGE:                              OPERATIVE REPORT   PREOPERATIVE DIAGNOSIS:  Posttraumatic osteoarthritis, left knee.  POSTOPERATIVE DIAGNOSIS:  Posttraumatic osteoarthritis, left knee.  PROCEDURE:  Left total knee arthroplasty.  SURGEON:  Ollen Gross, M.D.  ASSISTANT:  Alexzandrew L. Perkins, P.A.C.  ANESTHESIA:  General.  ESTIMATED BLOOD LOSS:  Minimal.  DRAIN:  Hemovac x1 in the knee joint and Hemovac x1 in the subcutaneous tissue.  TOURNIQUET TIME:  90 minutes at 300 mmHg.  COMPLICATIONS:  None.  CONDITION:  Stable to Recovery.  BRIEF CLINICAL NOTE:  Gregory [name] is a 51 year old male who had a severe distal femur fracture many years ago, gone on to develop severe posttraumatic arthritis.  I removed his hardware several months ago in anticipation of going ahead and replacing his knee.  He healed uneventfully from the hardware removal.  He has very limited motion in that knee with range of about 10-30 degrees.  He presents now for left total knee arthroplasty.  I told him that given his severe preoperative stiffness our goal of this operation was going to be to improve his pain, but I was not very confident that he would get normal motion back. He understood that and agreed to proceed with the surgery.  PROCEDURE IN DETAIL:  After successful administration of spinal anesthetic, a tourniquet was placed high on the left thigh and left lower extremity was prepped and draped in the usual sterile fashion. Extremities wrapped in Esmarch.  Tourniquet inflated to 300 mmHg. Midline incision was made with a 10 blade through subcutaneous tissue through the extensor mechanism.  Subcu flaps were created medial and lateral.  A fresh  blade was used to make a medial parapatellar arthrotomy.  Soft tissue on the proximal medial tibia subperiosteally elevated to the joint line with a knife and into the semimembranosus bursa with a Cobb elevator.  The tissue was very tight.  Laterally, the patella was essentially stuck down to the femur.  I had released that and released the soft tissue laterally with attention being paid to avoid the patellar tendon on tibial tubercle.  I did not do a lateral retinacular release.  I released off the tibia and I kept the patella tendon intact.  I was then able to sublux the patella, but any time I tired to flex, it would pop back into the trochlear groove.  Given the severe stiffness and fear of refracturing with a forced flexion, I decided to perform a quadriceps snip.  A transverse incision was made in the quad tendon about 3-4 cm above the superior pole of the patella. The patella was then easily subluxed laterally.  Stable to flex to 70 degrees and then had to remove more soft tissue off the femoral condyles in order to flex past 90.  Note that the fracture healed in the translated position with the femoral condyles slightly translated medial to the shaft.  I thus created the starting hole in the distal femur a little more lateral than usual.  The starting hole was created with the drill.  Canal was then thoroughly irrigated.  I reamed up to a 14 mm, which had great fit in the femoral canal.  We paced my distal femoral cut off the 14 mm reamer.  This placed in 5 degrees of valgus on the left.  I removed 12 mm off the distal femur which only got bone medially.  Laterally barely skim the bone.  I went up on the lateral side,  additional 8 mm to get into these cancellous bone.  We thus were going to need an 8 mm distal augments.  The tibia then subluxed forward.  Menisci were totally scarred down to the tissue and there were removed after being separated from the underlying tissue.  The  extramedullary tibial alignment guide was placed referencing proximally to medial aspect of the tibial tubercle and distally along the second metatarsal axis and tibial crest.  The block was pinned to remove about 2 mm off the more deficient lateral side. Tibial resection was made with an oscillating saw.  We prepared the proximal tibia for a MBT revision tray.  Size 4 was most appropriate size.  We drilled for the revision tray and then did a keel punch.  I had used the revision tray in case we would need a more constrained implant now or in the future.  The sizing guide was placed on the femur size 5 as it was most appropriate.  Distal femoral cutting block was placed with rotation of the epicondylar axis and confirmed by creating rectangular flexion gap at 90 degrees with the spacer block in place.  The cut was made in the +2 position to effectively lower the anterior flange of the femur down to the femoral shaft.  The anterior-posterior chamfer cuts were then made with an oscillating saw.  The condylar block was placed and that cut was made.  The trial femur which is a size 5, posterior stabilized and 8 mm distal lateral augment, and a 14 x 75 mm stem extension in the +2 position with 5 degrees of valgus.  This had great fit on distal femur.  We then placed a 10 mm insert.  Full extensions achieved with excellent varus-valgus and anterior-posterior balance throughout full range of motion.  Patella was then everted and thickness measured to be 27 mm.  Freehand resection was taken at 15 mm, 41 template was placed, lug holes were drilled, trial patella was placed, and it tracks normally.  Osteophytes were then removed off the posterior femur with the trial in place.  Note that since the distal femur was somewhat offset compared to the canal when I placed the stem, it did lead to a tiny bit of overhanging of the femoral component laterally.  This was between 1 and 2 mm, so it was barely  overhang, was not getting impinged with the soft tissues.  The trials were removed and the cut bone surfaces were then prepared with pulsatile lavage.  Cement was mixed and once ready for implantation, the size 4 MBT revision tray, size 5 posterior stabilized femur with 8 mm distal lateral augment and a 14 x 75 stem extension in the +2 position, 5 degrees valgus on the left. This was all impacted and cemented distally.  The stem was press fit proximally.  A 10 mm trial insert was placed, knee held in full extension.  All extruded  cement removed.  The 41 patella was cemented in place and held with a clamp.  Cement fully hardened and permanent.  10 mm posterior stabilized rotating platform insert was placed in the tibial tray.  Wounds copiously irrigated with saline solution and the arthrotomy closed over Hemovac drain with interrupted #1 PDS to close the quad snip and running number #1 V-Loc to close the rest of the retinaculum.  Flexion against gravity was about 60 degrees.  Full extension was achieved.  Tourniquet was then released total time of 90 minutes.  Subcu was closed with interrupted 2-0 Vicryl.  A Hemovac drain was also placed subcu.  Skin was closed with staples.  Catheter for Marcaine pain pumps placed and pumps initiated.  Incision was cleaned and dried, and a bulky sterile dressing applied.  He was placed into a knee immobilizer, awakened, and transported to Recovery in stable condition.     Ollen Gross, M.D.     FA/MEDQ  D:  07/16/2011  T:  07/17/2011  Job:  [id number]

## 2011-07-17 NOTE — Evaluation (Signed)
Occupational Therapy Evaluation Patient Details Name: Gregory [name] MRN: [id number] DOB: 05/06/1960 Today's Date: 07/17/2011 Time: 9562-1308 OT Time Calculation (min): 1350 min  OT Assessment / Plan / Recommendation Clinical Impression  Pt admitted for L TKA.  Pt is moving well and able to perform ADL with min guard to supervision.  No further OT needs.    OT Assessment  Patient does not need any further OT services    Follow Up Recommendations  No OT follow up;Supervision/Assistance - 24 hour    Barriers to Discharge      Equipment Recommendations  None recommended by OT    Recommendations for Other Services    Frequency       Precautions / Restrictions Precautions Precautions: Fall;Knee Required Braces or Orthoses: Knee Immobilizer - Left Knee Immobilizer - Left: Discontinue once straight leg raise with < 10 degree lag Restrictions Weight Bearing Restrictions: No   Pertinent Vitals/Pain *8/10 L knee    ADL  Eating/Feeding: Performed;Independent Where Assessed - Eating/Feeding: Chair Grooming: Simulated;Supervision/safety Where Assessed - Grooming: Supported standing Upper Body Bathing: Simulated;Set up Where Assessed - Upper Body Bathing: Unsupported sitting Lower Body Bathing: Supervision/safety Where Assessed - Lower Body Bathing: Supported sit to stand Upper Body Dressing: Performed;Set up Where Assessed - Upper Body Dressing: Unsupported sitting Lower Body Dressing: Performed;Supervision/safety Where Assessed - Lower Body Dressing: Sopported sit to stand Toilet Transfer: Simulated;Min guard Toilet Transfer Method: Other (comment) (ambulating) Equipment Used: Rolling walker;Knee Immobilizer Transfers/Ambulation Related to ADLs: min guard assist for ambulation with RW ADL Comments: Pt able to access L LE without devices.  Verbal instruction in tub transfer.  Pt declining DME to sit on during showering.  Has grab bars in tub/shower.    OT Diagnosis:    OT  Problem List:   OT Treatment Interventions:     OT Goals    Visit Information  Last OT Received On: 07/17/11 Assistance Needed: +1    Subjective Data  Subjective: "I want to walk. Patient Stated Goal: Walk.   Prior Functioning  Home Living Lives With: Spouse Available Help at Discharge: Available 24 hours/day Type of Home: House Home Access: Stairs to enter [employer] of Steps: 2 Entrance Stairs-Rails: None Home Layout: Two level;Bed/bath upstairs Alternate Level Stairs-Number of Steps: 13 Alternate Level Stairs-Rails: Left Bathroom Shower/Tub: Engineer, manufacturing systems: Standard Home Adaptive Equipment: Crutches;Walker - rolling;Bedside commode/3-in-1;Grab bars in shower Prior Function Level of Independence: Independent Able to Take Stairs?: Yes Communication Communication: No difficulties Dominant Hand: Right    Cognition  Overall Cognitive Status: Appears within functional limits for tasks assessed/performed Arousal/Alertness: Awake/alert Orientation Level: Appears intact for tasks assessed Behavior During Session: Other (comment) (impulsive)    Extremity/Trunk Assessment Right Upper Extremity Assessment RUE ROM/Strength/Tone: Within functional levels Left Upper Extremity Assessment LUE ROM/Strength/Tone: Within functional levels Right Lower Extremity Assessment RLE ROM/Strength/Tone: Within functional levels RLE Sensation: WFL - Light Touch Left Lower Extremity Assessment LLE ROM/Strength/Tone: Deficits LLE ROM/Strength/Tone Deficits: pt unable to perform a SLR.  LLE Sensation: WFL - Light Touch   Mobility Bed Mobility Bed Mobility: Sit to Supine Supine to Sit: 4: Min guard Sit to Supine: 5: Supervision;HOB flat Details for Bed Mobility Assistance: pt was ablwe to slide LLE to edge of bed w/ no assist. Transfers Sit to Stand: 4: Min guard;From chair/3-in-1;With upper extremity assist Stand to Sit: 4: Min guard;To bed;With upper  extremity assist Details for Transfer Assistance: VC for placing LLE forward and reaching back to chair.   Exercise  Balance    End of Session OT - End of Session Activity Tolerance: Patient tolerated treatment well Patient left: in bed;with family/visitor present;with call bell/phone within reach Nurse Communication: Other (comment) (pts pain level)    Evern Bio 07/17/2011, 3:16 PM

## 2011-07-17 NOTE — Evaluation (Signed)
Physical Therapy Evaluation Patient Details Name: Gregory [name] MRN: [id number] DOB: Apr 19, 1960 Today's Date: 07/17/2011 Time: 0921-0958 PT Time Calculation (min): 37 min  PT Assessment / Plan / Recommendation Clinical Impression  pt is s/p LTKA following trauma of L knee in past. pt was very determined to walk today and did not want to stop. pt will benefit from PT to improve w/ ROM, strength and functional mobility to DC to home.    PT Assessment  Patient needs continued PT services    Follow Up Recommendations  Home health PT;Supervision/Assistance - 24 hour    Barriers to Discharge        lEquipment Recommendations  None recommended by PT    Recommendations for Other Services OT consult   Frequency 7X/week    Precautions / Restrictions Precautions Precautions: Knee Required Braces or Orthoses: Knee Immobilizer - Left Knee Immobilizer - Left: Discontinue once straight leg raise with < 10 degree lag Restrictions Weight Bearing Restrictions: No   Pertinent Vitals/Pain 6-7 pain after meds      Mobility  Bed Mobility Bed Mobility: Supine to Sit Supine to Sit: 4: Min guard Details for Bed Mobility Assistance: pt was ablwe to slide LLE to edge of bed w/ no assist. Transfers Transfers: Sit to Stand;Stand to Sit Sit to Stand: 4: Min assist;From bed;With upper extremity assist Stand to Sit: To chair/3-in-1;4: Min guard;With upper extremity assist Details for Transfer Assistance: VC for placing LLE forward and reaching back to chair. Ambulation/Gait Ambulation/Gait Assistance: 4: Min assist Ambulation Distance (Feet): 600 Feet Assistive device: Rolling walker Ambulation/Gait Assistance Details: P requested to walk as far as he did, encouraged pt to limit but he insisted. no increase in pain. pt does walk on forefoot due to LLD on L Gait Pattern: Step-through pattern;Decreased stance time - left    Exercises Total Joint Exercises Quad Sets: AROM;Left;10 reps;Supine    PT Diagnosis: Difficulty walking;Acute pain  PT Problem List: Decreased strength;Decreased range of motion;Pain PT Treatment Interventions: DME instruction;Gait training;Stair training;Functional mobility training;Therapeutic activities;Therapeutic exercise;Patient/family education   PT Goals Acute Rehab PT Goals PT Goal Formulation: With patient/family Time For Goal Achievement: 07/24/11 Potential to Achieve Goals: Good Pt will go Supine/Side to Sit: with modified independence;with HOB 0 degrees PT Goal: Supine/Side to Sit - Progress: Goal set today Pt will go Sit to Supine/Side: with modified independence;with HOB 0 degrees PT Goal: Sit to Supine/Side - Progress: Goal set today Pt will go Sit to Stand: with supervision;with upper extremity assist PT Goal: Sit to Stand - Progress: Goal set today Pt will go Stand to Sit: with supervision;with upper extremity assist PT Goal: Stand to Sit - Progress: Goal set today Pt will Ambulate: >150 feet;with supervision;with rolling walker PT Goal: Ambulate - Progress: Goal set today Pt will Go Up / Down Stairs: Flight;1-2 stairs;with least restrictive assistive device;with min assist PT Goal: Up/Down Stairs - Progress: Goal set today Pt will Perform Home Exercise Program: with supervision, verbal cues required/provided PT Goal: Perform Home Exercise Program - Progress: Goal set today  Visit Information  Last PT Received On: 07/17/11 Assistance Needed: +1    Subjective Data  Subjective: I can take it the pain. I want to keep going. Patient Stated Goal: to walk w/ no pain   Prior Functioning  Home Living Lives With: Spouse Available Help at Discharge: Available 24 hours/day Type of Home: House Home Access: Stairs to enter [employer] of Steps: 2 Entrance Stairs-Rails: None Home Layout: Two level;Bed/bath upstairs Alternate  Level Stairs-Number of Steps: 13 Alternate Level Stairs-Rails: Left Bathroom Toilet: Standard Home  Adaptive Equipment:  (has a shoe lift on L)    Cognition  Overall Cognitive Status: Appears within functional limits for tasks assessed/performed Arousal/Alertness: Awake/alert Orientation Level: Appears intact for tasks assessed Behavior During Session: [facility] for tasks performed    Extremity/Trunk Assessment Right Lower Extremity Assessment RLE ROM/Strength/Tone: Within functional levels RLE Sensation: WFL - Light Touch Left Lower Extremity Assessment LLE ROM/Strength/Tone: Deficits LLE ROM/Strength/Tone Deficits: pt unable to perform a SLR.  LLE Sensation: WFL - Light Touch   Balance    End of Session PT - End of Session Equipment Utilized During Treatment: Left knee immobilizer Activity Tolerance: Patient tolerated treatment well Patient left: in chair;with call bell/phone within reach;with family/visitor present Nurse Communication: Mobility status CPM Left Knee CPM Left Knee: Off   Rada Hay 07/17/2011, 12:33 PM  [phone]

## 2011-07-17 NOTE — Care Management Note (Signed)
    Page 1 of 2   07/18/2011     5:40:04 PM   CARE MANAGEMENT NOTE 07/18/2011  Patient:  Gregory [name], Gregory [name]   Account Number:  [id number]  Date Initiated:  07/17/2011  Documentation initiated by:  Gregory [name]  Subjective/Objective Assessment:   dx total left knee replacemnt     Action/Plan:   CM spoke with patient and spouse. Plans are for patient to return to his home where spouse will be caregiver. Already has DME. Has used [employer] in the past and wishe sto use them again   Anticipated DC Date:  07/19/2011   Anticipated DC Plan:  HOME W HOME HEALTH SERVICES  In-house referral  NA      DC Planning Services  CM consult      [facility] Choice  HOME HEALTH   Choice offered to / List presented to:  C-1 Patient   DME arranged  NA      DME agency  NA     HH arranged  HH-2 PT      [facility] agency  [facility]   Status of service:  Completed, signed off Medicare Important Message given?  NA - LOS <3 / Initial given by admissions (If response is "NO", the following Medicare IM given date fields will be blank) Date Medicare IM given:   Date Additional Medicare IM given:    Discharge Disposition:    Per UR Regulation:    If discussed at Long Length of Stay Meetings, dates discussed:    Comments:  07/18/2011 Gregory [name] BSN CCM [phone] Anticipate discharge 07/19/2011. Gregory [name] will provide [facility] services with start date of 07/20/2011.
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
## 2011-07-18 ENCOUNTER — Encounter (HOSPITAL_COMMUNITY): Payer: Self-pay | Admitting: Orthopedic Surgery

## 2011-07-18 LAB — BASIC METABOLIC PANEL
CO2: 28 mEq/L (ref 19–32)
Calcium: 8.2 mg/dL — ABNORMAL LOW (ref 8.4–10.5)
GFR calc Af Amer: 78 mL/min — ABNORMAL LOW (ref >90)
GFR calc non Af Amer: 67 mL/min — ABNORMAL LOW (ref >90)
Sodium: 133 mEq/L — ABNORMAL LOW (ref 135–145)

## 2011-07-18 LAB — CBC
MCH: 28.6 pg (ref 26.0–34.0)
Platelets: 301 10*3/uL (ref 150–400)
RBC: 3.6 MIL/uL — ABNORMAL LOW (ref 4.22–5.81)
RDW: 12.9 % (ref 11.5–15.5)

## 2011-07-18 MED ORDER — OXYCODONE HCL 10 MG PO TB12: 10.0000 mg | ORAL_TABLET | Freq: Two times a day (BID) | ORAL | Status: DC

## 2011-07-18 MED ORDER — RIVAROXABAN 10 MG PO TABS: 10.0000 mg | ORAL_TABLET | Freq: Every day | ORAL | Status: DC

## 2011-07-18 MED ORDER — METHOCARBAMOL 500 MG PO TABS: 500.0000 mg | ORAL_TABLET | Freq: Four times a day (QID) | ORAL | Status: AC | PRN

## 2011-07-18 MED ORDER — OXYCODONE HCL 5 MG PO TABS: 5.0000 mg | ORAL_TABLET | ORAL | Status: AC | PRN

## 2011-07-18 NOTE — Progress Notes (Signed)
Physical Therapy Treatment Patient Details Name: Gregory [name] MRN: [id number] DOB: November 28, 1960 Today's Date: 07/18/2011 Time: 1478-2956 PT Time Calculation (min): 19 min  PT Assessment / Plan / Recommendation Comments on Treatment Session  Pt again attempting to amulate excessive amounts - discussed need to progress in moderation and reminded pt of pain level overnight    Follow Up Recommendations  Home health PT    Barriers to Discharge        Equipment Recommendations  None recommended by PT    Recommendations for Other Services OT consult  Frequency 7X/week   Plan Discharge plan remains appropriate;Frequency remains appropriate    Precautions / Restrictions Precautions Precautions: Knee Required Braces or Orthoses: Knee Immobilizer - Left Knee Immobilizer - Left: Discontinue once straight leg raise with < 10 degree lag Restrictions Weight Bearing Restrictions: No   Pertinent Vitals/Pain 5-6/10 with ambulation    Mobility  Bed Mobility Bed Mobility: Sit to Supine Supine to Sit: 4: Min guard Transfers Transfers: Sit to Stand;Stand to Sit Sit to Stand: From bed;5: Supervision;With upper extremity assist Stand to Sit: 4: Min guard;To chair/3-in-1 Details for Transfer Assistance: cues for saftey and use of UEs Ambulation/Gait Ambulation/Gait Assistance: 4: Min guard Ambulation Distance (Feet): 200 Feet Assistive device: Rolling walker Ambulation/Gait Assistance Details: cues for speed, position from RW and to limit distance  Gait Pattern: Step-through pattern;Step-to pattern    Exercises Total Joint Exercises Ankle Circles/Pumps: AROM;15 reps;Supine;Both Quad Sets: AROM;20 reps;Supine;Both Heel Slides: AAROM;20 reps;Supine;Left Straight Leg Raises: AAROM;20 reps;Supine;Left   PT Diagnosis:    PT Problem List:   PT Treatment Interventions:     PT Goals Acute Rehab PT Goals PT Goal Formulation: With patient/family Time For Goal Achievement: 07/24/11 Potential  to Achieve Goals: Good Pt will go Supine/Side to Sit: with modified independence;with HOB 0 degrees PT Goal: Supine/Side to Sit - Progress: Progressing toward goal Pt will go Sit to Supine/Side: with modified independence;with HOB 0 degrees PT Goal: Sit to Supine/Side - Progress: Progressing toward goal Pt will go Sit to Stand: with supervision PT Goal: Sit to Stand - Progress: Progressing toward goal Pt will go Stand to Sit: with supervision PT Goal: Stand to Sit - Progress: Progressing toward goal Pt will Ambulate: >150 feet;with supervision;with rolling walker PT Goal: Ambulate - Progress: Progressing toward goal Pt will Perform Home Exercise Program: with supervision, verbal cues required/provided PT Goal: Perform Home Exercise Program - Progress: Progressing toward goal  Visit Information  Last PT Received On: 07/18/11 Assistance Needed: +1    Subjective Data  Subjective: It feels good to be up Patient Stated Goal: Ride a bike   Cognition  Overall Cognitive Status: Appears within functional limits for tasks assessed/performed Arousal/Alertness: Awake/alert Orientation Level: Appears intact for tasks assessed Behavior During Session: [facility] for tasks performed Cognition - Other Comments: Pt does hava a h/o TBI.    Balance     End of Session PT - End of Session Activity Tolerance: Patient tolerated treatment well Patient left: in chair;with call bell/phone within reach;with family/visitor present Nurse Communication: Mobility status    Taylour Lietzke 07/18/2011, 1:24 PM

## 2011-07-18 NOTE — Progress Notes (Signed)
Physical Therapy Treatment Patient Details Name: Gregory [name] MRN: [id number] DOB: 1960/06/01 Today's Date: 07/18/2011 Time: 1025-1050 PT Time Calculation (min): 25 min  PT Assessment / Plan / Recommendation Comments on Treatment Session  OOB deferred to ice down after ther ex    Follow Up Recommendations  Home health PT    Barriers to Discharge        Equipment Recommendations  None recommended by PT    Recommendations for Other Services OT consult  Frequency 7X/week   Plan Discharge plan remains appropriate;Frequency remains appropriate    Precautions / Restrictions Precautions Precautions: Knee Required Braces or Orthoses: Knee Immobilizer - Left Knee Immobilizer - Left: Discontinue once straight leg raise with < 10 degree lag Restrictions Weight Bearing Restrictions: No   Pertinent Vitals/Pain 6/10 with there ex    Mobility       Exercises Total Joint Exercises Ankle Circles/Pumps: AROM;15 reps;Supine;Both Quad Sets: AROM;20 reps;Supine;Both Heel Slides: AAROM;20 reps;Supine;Left Straight Leg Raises: AAROM;20 reps;Supine;Left   PT Diagnosis:    PT Problem List:   PT Treatment Interventions:     PT Goals Acute Rehab PT Goals PT Goal Formulation: With patient/family Time For Goal Achievement: 07/24/11 Pt will Perform Home Exercise Program: with supervision, verbal cues required/provided PT Goal: Perform Home Exercise Program - Progress: Progressing toward goal  Visit Information  Last PT Received On: 07/18/11 Assistance Needed: +1    Subjective Data  Subjective: I had a rough night - a lot of pain Patient Stated Goal: Ride a bike   Cognition  Overall Cognitive Status: Appears within functional limits for tasks assessed/performed Arousal/Alertness: Awake/alert Orientation Level: Appears intact for tasks assessed Behavior During Session: [facility] for tasks performed    Balance     End of Session PT - End of Session Activity Tolerance: Patient tolerated  treatment well Patient left: with call bell/phone within reach;with family/visitor present;in bed    Gregory [name] 07/18/2011, 1:18 PM
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
## 2011-07-18 NOTE — Discharge Instructions (Signed)
 Dr. Frank Aluisio Total Joint Specialist [facility] [address] [place], [place] [address] [phone]  TOTAL KNEE REPLACEMENT POSTOPERATIVE DIRECTIONS    Knee Rehabilitation, Guidelines Following Surgery  Results after knee surgery are often greatly improved when you follow the exercise, range of motion and muscle strengthening exercises prescribed by your doctor. Safety measures are also important to protect the knee from further injury. Any time any of these exercises cause you to have increased pain or swelling in your knee joint, decrease the amount until you are comfortable again and slowly increase them. If you have problems or questions, call your caregiver or physical therapist for advice.   HOME CARE INSTRUCTIONS  Remove items at home which could result in a fall. This includes throw rugs or furniture in walking pathways.  Continue medications as instructed at time of discharge. You may have some home medications which will be placed on hold until you complete the course of blood thinner medication.  You may start showering once you are discharged home but do not submerge the incision under water. Just pat the incision dry and apply a dry gauze dressing on daily. Walk with walker as instructed.   Use walker as long as suggested by your caregivers.  Avoid periods of inactivity such as sitting longer than an hour when not asleep. This helps prevent blood clots.  You may put full weight on your legs and walk as much as is comfortable.  You may resume a sexual relationship in one month or when given the OK by your doctor.  You may return to work once you are cleared by your doctor.  Do not drive a car for 6 weeks or until released by you surgeon.   Do not drive while taking narcotics.  Wear the elastic stockings for three weeks following surgery during the day but you may remove then at night. Make sure you keep all of your appointments after your  operation with all of your doctors and caregivers. You should call the office at the above phone number and make an appointment for approximately two weeks after the date of your surgery. Change the dressing daily and reapply a dry dressing each time. Please pick up a stool softener and laxative for home use as long as you are requiring pain medications.  Continue to use ice on the knee for pain and swelling from surgery. You may notice swelling that will progress down to the foot and ankle.  This is normal after surgery.  Elevate the leg when you are not up walking on it.   It is important for you to complete the blood thinner medication as prescribed by your doctor.  Continue to use the breathing machine which will help keep your temperature down.  It is common for your temperature to cycle up and down following surgery, especially at night when you are not up moving around and exerting yourself.  The breathing machine keeps your lungs expanded and your temperature down.  RANGE OF MOTION AND STRENGTHENING EXERCISES  Rehabilitation of the knee is important following a knee injury or an operation. After just a few days of immobilization, the muscles of the thigh which control the knee become weakened and shrink (atrophy). Knee exercises are designed to build up the tone and strength of the thigh muscles and to improve knee motion. Often times heat used for twenty to thirty minutes before working out will loosen up your tissues and help with improving the range   of motion but do not use heat for the first two weeks following surgery. These exercises can be done on a training (exercise) mat, on the floor, on a table or on a bed. Use what ever works the best and is most comfortable for you Knee exercises include:  Leg Lifts - While your knee is still immobilized in a splint or cast, you can do straight leg raises. Lift the leg to 60 degrees, hold for 3 sec, and slowly lower the leg. Repeat 10-20 times 2-3  times daily. Perform this exercise against resistance later as your knee gets better.  Quad and Hamstring Sets - Tighten up the muscle on the front of the thigh (Quad) and hold for 5-10 sec. Repeat this 10-20 times hourly. Hamstring sets are done by pushing the foot backward against an object and holding for 5-10 sec. Repeat as with quad sets.  A rehabilitation program following serious knee injuries can speed recovery and prevent re-injury in the future due to weakened muscles. Contact your doctor or a physical therapist for more information on knee rehabilitation.   SKILLED REHAB INSTRUCTIONS: If the patient is transferred to a skilled rehab facility following release from the hospital, a list of the current medications will be sent to the facility for the patient to continue.  When discharged from the skilled rehab facility, please have the facility set up the patient's Home Health Physical Therapy prior to being released. Also, the skilled facility will be responsible for providing the patient with their medications at time of release from the facility to include their pain medication, the muscle relaxants, and their blood thinner medication. If the patient is still at the rehab facility at time of the two week follow up appointment, the skilled rehab facility will also need to assist the patient in arranging follow up appointment in our office and any transportation needs.  MAKE SURE YOU:  Understand these instructions.  Will watch your condition.  Will get help right away if you are not doing well or get worse.  Document Released: 02/03/2005 Document Revised: 01/23/2011 Document Reviewed: 07/24/2006  ExitCare Patient Information 2012 [employer].   Take Xarelto for two and a half more weeks, then discontinue Xarelto.        

## 2011-07-19 LAB — URINALYSIS, ROUTINE W REFLEX MICROSCOPIC
Leukocytes, UA: NEGATIVE
Nitrite: NEGATIVE
Specific Gravity, Urine: 1.01 (ref 1.005–1.030)
Urobilinogen, UA: 0.2 mg/dL (ref 0.0–1.0)
pH: 7 (ref 5.0–8.0)

## 2011-07-19 LAB — CBC
MCV: 86.6 fL (ref 78.0–100.0)
Platelets: 339 10*3/uL (ref 150–400)
RBC: 3.66 MIL/uL — ABNORMAL LOW (ref 4.22–5.81)
RDW: 13.1 % (ref 11.5–15.5)
WBC: 20.4 10*3/uL — ABNORMAL HIGH (ref 4.0–10.5)

## 2011-07-19 NOTE — Progress Notes (Signed)
Subjective: 3 Days Post-Op Procedure(s) (LRB): TOTAL KNEE ARTHROPLASTY (Left) Patient reports pain as moderate.   Denies CP or SOB.  Voiding without difficulty. Positive flatus.  He wife is a little concerned because he is having some sweats and has also been complaining of burning with urination.   Objective: Vital signs in last 24 hours: Temp:  [98.6 F (37 C)-99.7 F (37.6 C)] 99.1 F (37.3 C) (06/01 0624) Pulse Rate:  [96-106] 102  (06/01 0624) Resp:  [18-20] 18  (06/01 0624) BP: (128-137)/(78-83) 128/78 mmHg (06/01 0624) SpO2:  [91 %-96 %] 91 % (06/01 0624)  Intake/Output from previous day: 05/31 0701 - 06/01 0700 In: 196 [P.O.:120; I.V.:76] Out: 1752 [Urine:1752] Intake/Output this shift:     Basename 07/19/11 0423 07/18/11 0435 07/17/11 0446  HGB 10.4* 10.3* 10.5*    Basename 07/19/11 0423 07/18/11 0435  WBC 20.4* 15.4*  RBC 3.66* 3.60*  HCT 31.7* 30.6*  PLT 339 301    Basename 07/18/11 0435 07/17/11 0446  NA 133* 136  K 3.6 3.8  CL 98 103  CO2 28 24  BUN 8 11  CREATININE 1.21 1.17  GLUCOSE 115* 128*  CALCIUM 8.2* 8.0*   No results found for this basename: LABPT:2,INR:2 in the last 72 hours  Neurologically intact Neurovascular intact Sensation intact distally Dorsiflexion/Plantar flexion intact Incision: no drainage Compartment soft  Assessment/Plan: 3 Days Post-Op Procedure(s) (LRB): TOTAL KNEE ARTHROPLASTY (Left) Advance diet Up with therapy Will hold d/c for today plan to check UA Encourage IS Daily dressing change  Cloris Flippo R. 07/19/2011, 7:11 AM

## 2011-07-19 NOTE — Progress Notes (Signed)
Contact precaution discontinued. MRSA, PCR negative this admission.

## 2011-07-19 NOTE — Progress Notes (Signed)
Physical Therapy Treatment Patient Details Name: Gregory [name] MRN: [id number] DOB: [birth year] Today's Date: 07/19/2011 Time: 4098-1191 PT Time Calculation (min): 15 min  PT Assessment / Plan / Recommendation Comments on Treatment Session  Limited session due to pain, not feeling well. Pt has fever 100.2 (noted in chart). Pt declined ROM exercises after ambulation. Possible d/c home tomorrow.     Follow Up Recommendations  Home health PT    Barriers to Discharge        Equipment Recommendations  None recommended by PT    Recommendations for Other Services    Frequency 7X/week   Plan Discharge plan remains appropriate    Precautions / Restrictions Precautions Precautions: Knee Restrictions Weight Bearing Restrictions: No LLE Weight Bearing: Weight bearing as tolerated   Pertinent Vitals/Pain 8/10 pain L knee. Fever of 100.2.    Mobility  Bed Mobility Bed Mobility: Supine to Sit;Sit to Supine Supine to Sit: 6: Modified independent (Device/Increase time) Sit to Supine: 6: Modified independent (Device/Increase time) Details for Bed Mobility Assistance: Pt uses RLE to support LLE onto/off bed. Transfers Transfers: Sit to Stand;Stand to Sit Sit to Stand: 5: Supervision Stand to Sit: 5: Supervision Details for Transfer Assistance: VCs safety Ambulation/Gait Ambulation/Gait Assistance: 4: Min guard Ambulation Distance (Feet): 175 Feet Assistive device: Rolling walker Ambulation/Gait Assistance Details: Did not progress ambulation distance due to pain and pt generally not feeling well. Dyspnea 2/4 with ambulation Gait Pattern: Antalgic;Trunk flexed;Left flexed knee in stance;Step-through pattern Stairs: Yes   Exercises     PT Diagnosis:    PT Problem List:   PT Treatment Interventions:     PT Goals Acute Rehab PT Goals PT Goal: Supine/Side to Sit - Progress: Progressing toward goal PT Goal: Sit to Supine/Side - Progress: Progressing toward goal PT Goal: Sit to Stand  - Progress: Progressing toward goal PT Goal: Stand to Sit - Progress: Progressing toward goal PT Goal: Ambulate - Progress: Progressing toward goal PT Goal: Up/Down Stairs - Progress: Discontinued (comment) (pt comfortable with stair negotiation with 1 crutch 1 rail-n)  Visit Information  Last PT Received On: 07/19/11 Assistance Needed: +1    Subjective Data  Subjective: "I don't feel good." Patient Stated Goal: Home. Fever down   Cognition  Overall Cognitive Status: Appears within functional limits for tasks assessed/performed Arousal/Alertness: Awake/alert Orientation Level: Appears intact for tasks assessed Behavior During Session: [facility] for tasks performed    Balance     End of Session PT - End of Session Equipment Utilized During Treatment: Gait belt Activity Tolerance: Patient limited by pain Patient left: in bed;with call bell/phone within reach;with family/visitor present    [name] [facility] 07/19/2011, 3:43 PM [phone]

## 2011-07-19 NOTE — Progress Notes (Signed)
Physical Therapy Treatment Patient Details Name: Gregory [name] MRN: [id number] DOB: 1960-05-31 Today's Date: 07/19/2011 Time: 1040-1100 PT Time Calculation (min): 20 min  PT Assessment / Plan / Recommendation Comments on Treatment Session  D/C held today due to pt with fever per pt's wife. Pt continues to perform well with therapy. Requires cues to not over-do activity. Plans to d/c home tomorrow.     Follow Up Recommendations  Home health PT    Barriers to Discharge        Equipment Recommendations  None recommended by PT    Recommendations for Other Services    Frequency 7X/week   Plan Discharge plan remains appropriate    Precautions / Restrictions Precautions Precautions: Knee Restrictions Weight Bearing Restrictions: No LLE Weight Bearing: Weight bearing as tolerated   Pertinent Vitals/Pain     Mobility  Bed Mobility Bed Mobility: Supine to Sit Supine to Sit: 5: Supervision Sit to Supine: 5: Supervision Details for Bed Mobility Assistance: Pt uses R LE to help support/move L LE on/off bed Transfers Transfers: Sit to Stand;Stand to Sit Sit to Stand: 5: Supervision;From bed Stand to Sit: 5: Supervision;To bed Details for Transfer Assistance: VCs safety Ambulation/Gait Ambulation/Gait Assistance: 4: Min guard Ambulation Distance (Feet): 175 Feet Assistive device: Rolling walker Ambulation/Gait Assistance Details: VCs posture, safety. Decreased Wbing on L LE due to pain, tightness per pt. Dyspnea 2/4 with ambulation Gait Pattern: Step-through pattern;Left flexed knee in stance;Antalgic;Decreased stride length;Trunk flexed Stairs: Yes Stairs Assistance: 5: Supervision Stairs Assistance Details (indicate cue type and reason): Performed well. Pt denied need to practice steps initially but agreeable to demonstrating negotiation of 2 steps. Pt feels comfortable with stairs-no need to practice further.  Stair Management Technique: Step to pattern;Forwards;With  crutches;One rail Right Number of Stairs: 2     Exercises     PT Diagnosis:    PT Problem List:   PT Treatment Interventions:     PT Goals Acute Rehab PT Goals PT Goal: Supine/Side to Sit - Progress: Progressing toward goal PT Goal: Sit to Supine/Side - Progress: Progressing toward goal PT Goal: Sit to Stand - Progress: Progressing toward goal PT Goal: Stand to Sit - Progress: Progressing toward goal PT Goal: Ambulate - Progress: Progressing toward goal PT Goal: Up/Down Stairs - Progress: Discontinued (comment) (pt comfortable with stair negotiation with 1 crutch 1 rail-no need to practice further)  Visit Information  Last PT Received On: 07/19/11 Assistance Needed: +1    Subjective Data  Subjective: "I can do steps...Marland KitchenMarland Kitchenthis isn't my first time" Patient Stated Goal: Home   Cognition  Overall Cognitive Status: Appears within functional limits for tasks assessed/performed Arousal/Alertness: Awake/alert Orientation Level: Appears intact for tasks assessed Behavior During Session: [facility] for tasks performed    Balance     End of Session PT - End of Session Equipment Utilized During Treatment: Gait belt Activity Tolerance: Patient tolerated treatment well Patient left: in bed;with call bell/phone within reach;with family/visitor present CPM Left Knee CPM Left Knee: Off    [name] [facility] 07/19/2011, 12:10 PM [phone]

## 2011-07-20 NOTE — Progress Notes (Signed)
Physical Therapy Treatment Patient Details Name: Gregory [name] MRN: [id number] DOB: Aug 11, 1960 Today's Date: 07/20/2011 Time: 4098-1191 PT Time Calculation (min): 30 min  PT Assessment / Plan / Recommendation Comments on Treatment Session  Pt doing well with mobility. Left knee flexion/extension AROM significantly limited, pt states it is improved compared to ROM prior to surgery. Encouraged frequent mobility. Pt ready to DC home from PT standpoint.    Follow Up Recommendations  Home health PT    Barriers to Discharge        Equipment Recommendations  None recommended by PT    Recommendations for Other Services    Frequency 7X/week   Plan Discharge plan remains appropriate    Precautions / Restrictions Precautions Precautions: Knee Restrictions Weight Bearing Restrictions: No LLE Weight Bearing: Weight bearing as tolerated   Pertinent Vitals/Pain *8/10 L knee; pain meds requested, ice applied, LLE elevated**    Mobility  Bed Mobility Bed Mobility: Supine to Sit Supine to Sit: 6: Modified independent (Device/Increase time) Sit to Supine: 6: Modified independent (Device/Increase time) Details for Bed Mobility Assistance: Pt uses RLE to support LLE onto/off bed. Transfers Transfers: Sit to Stand;Stand to Sit Sit to Stand: 6: Modified independent (Device/Increase time) Stand to Sit: 6: Modified independent (Device/Increase time) Details for Transfer Assistance: VCs safety Ambulation/Gait Ambulation/Gait Assistance: 6: Modified independent (Device/Increase time) Ambulation Distance (Feet): 250 Feet Assistive device: Rolling walker Gait Pattern: Antalgic;Trunk flexed;Left flexed knee in stance;Step-through pattern    Exercises Total Joint Exercises Ankle Circles/Pumps: AROM;15 reps;Supine;Both Quad Sets: AROM;20 reps;Supine;Both Short Arc Quad: AAROM;Left;10 reps;Supine Heel Slides: AAROM;20 reps;Supine;Left Straight Leg Raises: AAROM;20 reps;Supine;Left Goniometric  ROM: left knee flexion approx 35*, ext -15*   PT Diagnosis:    PT Problem List:   PT Treatment Interventions:     PT Goals Acute Rehab PT Goals PT Goal Formulation: With patient Time For Goal Achievement: 07/21/11 Potential to Achieve Goals: Good Pt will go Supine/Side to Sit: with modified independence PT Goal: Supine/Side to Sit - Progress: Met Pt will go Sit to Supine/Side: with modified independence Pt will go Sit to Stand: with supervision PT Goal: Sit to Stand - Progress: Met Pt will go Stand to Sit: with supervision PT Goal: Stand to Sit - Progress: Met Pt will Ambulate: >150 feet;with supervision;with rolling walker PT Goal: Ambulate - Progress: Met Pt will Perform Home Exercise Program: with supervision, verbal cues required/provided PT Goal: Perform Home Exercise Program - Progress: Progressing toward goal  Visit Information  Last PT Received On: 07/20/11 Assistance Needed: +1    Subjective Data  Subjective: I want to go home.  Patient Stated Goal: To get knee to bend more.   Cognition  Overall Cognitive Status: Appears within functional limits for tasks assessed/performed Arousal/Alertness: Awake/alert Orientation Level: Appears intact for tasks assessed Behavior During Session: [facility] for tasks performed    Balance     End of Session PT - End of Session Activity Tolerance: Patient tolerated treatment well Patient left: in bed;with call bell/phone within reach;with family/visitor present Nurse Communication: Mobility status    Tamala Ser 07/20/2011, 8:43 AM [phone]

## 2011-07-20 NOTE — Progress Notes (Signed)
Pt stable, scripts, d/c instructions given with no questions/concerns voiced by pt/wife.  Pt transported to private vehicle via wheelchair with NT and wife.

## 2011-07-20 NOTE — Progress Notes (Signed)
Subjective: 4 Days Post-Op Procedure(s) (LRB): TOTAL KNEE ARTHROPLASTY (Left) Patient reports pain as mild.   On oxy and oxy for pain.  No f/c/n/v/wt loss. UA neg from yesterday.  Objective: Vital signs in last 24 hours: Temp:  [98.4 F (36.9 C)-100.2 F (37.9 C)] 99.4 F (37.4 C) (06/02 0600) Pulse Rate:  [83-103] 95  (06/02 0600) Resp:  [18-20] 20  (06/02 0600) BP: (114-117)/(73-75) 114/75 mmHg (06/02 0600) SpO2:  [92 %-100 %] 92 % (06/02 0600)  Intake/Output from previous day: 06/01 0701 - 06/02 0700 In: 720 [P.O.:720] Out: 425 [Urine:425] Intake/Output this shift: Total I/O In: 240 [P.O.:240] Out: 400 [Urine:400]   Basename 07/19/11 0423 07/18/11 0435  HGB 10.4* 10.3*    Basename 07/19/11 0423 07/18/11 0435  WBC 20.4* 15.4*  RBC 3.66* 3.60*  HCT 31.7* 30.6*  PLT 339 301    Basename 07/18/11 0435  NA 133*  K 3.6  CL 98  CO2 28  BUN 8  CREATININE 1.21  GLUCOSE 115*  CALCIUM 8.2*   No results found for this basename: LABPT:2,INR:2 in the last 72 hours  Knee dressed and dry.  Feels LT at toes.  Assessment/Plan: 4 Days Post-Op Procedure(s) (LRB): TOTAL KNEE ARTHROPLASTY (Left) Discharge home with home health  Lalah Durango, [facility] 07/20/2011, 11:07 AM

## 2011-07-20 NOTE — Progress Notes (Signed)
Cm spoke with pt to confirm previous dc plan with [name] providing HH services. DME in room during interview. Pt hopeful for discharge today. Spouse at bedside to assist with home care.   Leonie Green [phone]

## 2011-07-23 DIAGNOSIS — M1710 Unilateral primary osteoarthritis, unspecified knee: Secondary | ICD-10-CM | POA: Diagnosis not present

## 2011-07-23 DIAGNOSIS — Z7901 Long term (current) use of anticoagulants: Secondary | ICD-10-CM | POA: Diagnosis not present

## 2011-07-23 DIAGNOSIS — S8290XS Unspecified fracture of unspecified lower leg, sequela: Secondary | ICD-10-CM | POA: Diagnosis not present

## 2011-07-23 DIAGNOSIS — F3289 Other specified depressive episodes: Secondary | ICD-10-CM | POA: Diagnosis not present

## 2011-07-23 DIAGNOSIS — IMO0001 Reserved for inherently not codable concepts without codable children: Secondary | ICD-10-CM | POA: Diagnosis not present

## 2011-07-23 DIAGNOSIS — Z471 Aftercare following joint replacement surgery: Secondary | ICD-10-CM | POA: Diagnosis not present

## 2011-07-31 ENCOUNTER — Other Ambulatory Visit: Payer: Self-pay | Admitting: Family Medicine
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
## 2011-08-12 DIAGNOSIS — M1710 Unilateral primary osteoarthritis, unspecified knee: Secondary | ICD-10-CM | POA: Diagnosis not present

## 2011-08-14 DIAGNOSIS — IMO0002 Reserved for concepts with insufficient information to code with codable children: Secondary | ICD-10-CM | POA: Diagnosis not present

## 2011-08-14 DIAGNOSIS — M1710 Unilateral primary osteoarthritis, unspecified knee: Secondary | ICD-10-CM | POA: Diagnosis not present

## 2011-08-18 DIAGNOSIS — M1710 Unilateral primary osteoarthritis, unspecified knee: Secondary | ICD-10-CM | POA: Diagnosis not present

## 2011-08-18 DIAGNOSIS — IMO0002 Reserved for concepts with insufficient information to code with codable children: Secondary | ICD-10-CM | POA: Diagnosis not present

## 2011-08-22 ENCOUNTER — Ambulatory Visit (INDEPENDENT_AMBULATORY_CARE_PROVIDER_SITE_OTHER): Payer: Medicare Other | Admitting: Family Medicine

## 2011-08-22 ENCOUNTER — Encounter: Payer: Self-pay | Admitting: Family Medicine

## 2011-08-22 VITALS — BP 110/72 | Temp 98.2°F | Ht 67.0 in | Wt 171.0 lb

## 2011-08-22 DIAGNOSIS — R5383 Other fatigue: Secondary | ICD-10-CM | POA: Diagnosis not present

## 2011-08-22 DIAGNOSIS — R351 Nocturia: Secondary | ICD-10-CM | POA: Insufficient documentation

## 2011-08-22 DIAGNOSIS — M1710 Unilateral primary osteoarthritis, unspecified knee: Secondary | ICD-10-CM | POA: Diagnosis not present

## 2011-08-22 DIAGNOSIS — IMO0002 Reserved for concepts with insufficient information to code with codable children: Secondary | ICD-10-CM | POA: Diagnosis not present

## 2011-08-22 DIAGNOSIS — R5381 Other malaise: Secondary | ICD-10-CM | POA: Diagnosis not present

## 2011-08-22 DIAGNOSIS — Z1211 Encounter for screening for malignant neoplasm of colon: Secondary | ICD-10-CM | POA: Insufficient documentation

## 2011-08-22 DIAGNOSIS — Z125 Encounter for screening for malignant neoplasm of prostate: Secondary | ICD-10-CM | POA: Insufficient documentation

## 2011-08-22 LAB — BASIC METABOLIC PANEL
CO2: 26 mEq/L (ref 19–32)
Calcium: 9.4 mg/dL (ref 8.4–10.5)
Chloride: 104 mEq/L (ref 96–112)
Creat: 1.19 mg/dL (ref 0.50–1.35)
Sodium: 139 mEq/L (ref 135–145)

## 2011-08-22 LAB — CBC WITH DIFFERENTIAL/PLATELET
Basophils Relative: 1 % (ref 0–1)
Hemoglobin: 12.4 g/dL — ABNORMAL LOW (ref 13.0–17.0)
Lymphs Abs: 2.8 10*3/uL (ref 0.7–4.0)
Monocytes Relative: 11 % (ref 3–12)
Neutro Abs: 3.5 10*3/uL (ref 1.7–7.7)
Neutrophils Relative %: 45 % (ref 43–77)
Platelets: 406 10*3/uL — ABNORMAL HIGH (ref 150–400)
RBC: 4.41 MIL/uL (ref 4.22–5.81)
WBC: 7.5 10*3/uL (ref 4.0–10.5)

## 2011-08-22 NOTE — Progress Notes (Signed)
Patient ID: DAVARIS YOUTSEY, male   DOB: August 06, 1960, 51 y.o.   MRN: [id number]  HPI: Hx obtained from pt and his wife. Had L knee replacement May 29. Ever since then, has been cold all the time. Also has been feeling tired. Has very dry skin. Has also started urinating in the bed during his sleep. This happened five times and then went away, last occuring last week. No hair loss. Has been more constipated lately. Stool has been black since after surgery, no bright red blood. No new medications.  Last colonoscopy was ~3 years ago, done by Dr. Elnoria Howard, and was reportedly normal. Does endorse chronic non-productive cough and night sweats. Upon chart review, pt has hx of unexplained weight loss and has been taking Megace for that.  ROS: See HPI  PMH: Smoking status: former smoker  Exam: Eyes - questionable mild conjunctival pallor Mouth - MMM, no lesions Heart - Regular rate and rhythm.  No murmurs, gallops or rubs.    Lungs:  Normal respiratory effort, chest expands symmetrically. Lungs are clear to auscultation, no crackles or wheezes. Abd: nontender, nondistended, no masses or guarding, + BS Rectal: normal size firm prostate without masses or irregularities, fecal occult blood negative Ext: no lower extremity edema Psych: pt with mildly blunted affect, but pleasant and cooperative with history and exam.

## 2011-08-22 NOTE — Patient Instructions (Signed)
It was a pleasure meeting you today.  I have ordered a chest x ray today to get a look at your lungs. We will call you early next week to let you know the results of your bloodwork and chest xray.  You will need to return to the clinic on Monday to have your TB test read.  Please make an appointment with Dr. Tye Savoy within the next week or two to follow up.

## 2011-08-22 NOTE — Assessment & Plan Note (Signed)
New onset bed wetting during the night. Prostate normal on exam. PSA normal one year ago, will recheck today. F/u with PCP at first available appointment.

## 2011-08-22 NOTE — Assessment & Plan Note (Signed)
Pt with new-onset cold intolerance, fatigue, night sweats. Also has chronic cough. The ddx includes hypothyroidism, anemia, malignancy, HIV, Hep C, and tuberculosis. Will check TSH, BMP, CBC, HIV, HCV, PSA, PPD, and chest x ray today. FOBT negative in office today. Prostate felt normal on exam, and pt had normal PSA one year ago, so a large jump in PSA could indicate prostate malignancy. Last colonoscopy was by Dr. Elnoria Howard and was reportedly normal. Will get these labs today and have pt f/u with PCP Dr. Tye Savoy at first available appointment within the next 2 weeks.

## 2011-08-23 LAB — PSA: PSA: 0.37 ng/mL (ref <=4.00)

## 2011-08-23 LAB — HIV ANTIBODY (ROUTINE TESTING W REFLEX): HIV: NONREACTIVE

## 2011-08-23 LAB — TSH: TSH: 1.135 u[IU]/mL (ref 0.350–4.500)

## 2011-08-23 LAB — HEPATITIS C ANTIBODY: HCV Ab: NEGATIVE

## 2011-08-25 ENCOUNTER — Ambulatory Visit (INDEPENDENT_AMBULATORY_CARE_PROVIDER_SITE_OTHER): Payer: Medicare Other | Admitting: *Deleted

## 2011-08-25 ENCOUNTER — Encounter: Payer: Self-pay | Admitting: Family Medicine

## 2011-08-25 DIAGNOSIS — IMO0001 Reserved for inherently not codable concepts without codable children: Secondary | ICD-10-CM

## 2011-08-25 DIAGNOSIS — M1710 Unilateral primary osteoarthritis, unspecified knee: Secondary | ICD-10-CM | POA: Diagnosis not present

## 2011-08-25 DIAGNOSIS — Z111 Encounter for screening for respiratory tuberculosis: Secondary | ICD-10-CM

## 2011-08-25 LAB — TB SKIN TEST: Induration: 0 mm

## 2011-08-26 ENCOUNTER — Encounter: Payer: Self-pay | Admitting: Family Medicine

## 2011-08-26 NOTE — Progress Notes (Signed)
Patient ID: Gregory [name], male   DOB: 09/26/1960, 51 y.o.   MRN: [id number]  Spoke with pt and his wife when they came to have his PPD read on 08/25/2011; let them know results of tests. Pt will likely need a colonoscopy due to changes in bowel habits and anemia. Pt has f/u appt with PCP Dr. Tye Savoy for 3 days from now. Will defer to her re: colonoscopy scheduling and additional workup for his symptoms.

## 2011-08-28 ENCOUNTER — Ambulatory Visit: Payer: Medicare Other | Admitting: Family Medicine

## 2011-09-01 ENCOUNTER — Emergency Department (HOSPITAL_COMMUNITY)
Admission: EM | Admit: 2011-09-01 | Discharge: 2011-09-02 | Disposition: A | Discharge status: Home / Self Care | Payer: Medicare Other | Attending: Emergency Medicine | Admitting: Emergency Medicine

## 2011-09-01 ENCOUNTER — Encounter (HOSPITAL_COMMUNITY): Payer: Self-pay | Admitting: Emergency Medicine

## 2011-09-01 ENCOUNTER — Other Ambulatory Visit: Payer: Self-pay | Admitting: Family Medicine

## 2011-09-01 ENCOUNTER — Emergency Department (HOSPITAL_COMMUNITY): Payer: Medicare Other

## 2011-09-01 DIAGNOSIS — Z96659 Presence of unspecified artificial knee joint: Secondary | ICD-10-CM | POA: Diagnosis not present

## 2011-09-01 DIAGNOSIS — M25569 Pain in unspecified knee: Secondary | ICD-10-CM | POA: Insufficient documentation

## 2011-09-01 DIAGNOSIS — M25469 Effusion, unspecified knee: Secondary | ICD-10-CM | POA: Insufficient documentation

## 2011-09-01 DIAGNOSIS — M7989 Other specified soft tissue disorders: Secondary | ICD-10-CM | POA: Diagnosis not present

## 2011-09-01 DIAGNOSIS — W19XXXA Unspecified fall, initial encounter: Secondary | ICD-10-CM | POA: Insufficient documentation

## 2011-09-01 MED ORDER — OXYCODONE-ACETAMINOPHEN 5-325 MG PO TABS
1.0000 | ORAL_TABLET | Freq: Once | ORAL | Status: AC
  Administered 2011-09-02: 1 via ORAL
  Filled 2011-09-01: qty 1

## 2011-09-01 NOTE — ED Notes (Signed)
Pt states he fell this evening about an hour ago  Pt states unsure how he fell but he fell forward and landed on his left knee  Pt just had knee replacement surgery on May 29th  Family states they called Dr Despina Hick and was told to bring pt in for evaluation  Pt has swelling and pain to the left knee  Wife states this is the 3 rd time pt has fallen in the past 2 weeks

## 2011-09-01 NOTE — ED Notes (Signed)
Left knee replacement done on 07/16/11. States pt has had multiple falls since surgery. Reports falling tonight and landing on left knee. States has not been able to ambulate afterwards. Reports knee is very swollen.

## 2011-09-02 ENCOUNTER — Other Ambulatory Visit: Payer: Self-pay | Admitting: Family Medicine

## 2011-09-02 NOTE — ED Notes (Signed)
Pt verbalizes undestanding
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
## 2011-09-08 DIAGNOSIS — M1710 Unilateral primary osteoarthritis, unspecified knee: Secondary | ICD-10-CM | POA: Diagnosis not present

## 2011-09-10 DIAGNOSIS — M1710 Unilateral primary osteoarthritis, unspecified knee: Secondary | ICD-10-CM | POA: Diagnosis not present

## 2011-09-12 DIAGNOSIS — M1710 Unilateral primary osteoarthritis, unspecified knee: Secondary | ICD-10-CM | POA: Diagnosis not present

## 2011-09-15 ENCOUNTER — Other Ambulatory Visit: Payer: Self-pay | Admitting: *Deleted

## 2011-09-15 MED ORDER — FLUOXETINE HCL 20 MG PO CAPS: 20.0000 mg | ORAL_CAPSULE | Freq: Every day | ORAL | Status: DC

## 2011-09-16 DIAGNOSIS — M1710 Unilateral primary osteoarthritis, unspecified knee: Secondary | ICD-10-CM | POA: Diagnosis not present

## 2011-09-18 DIAGNOSIS — M1710 Unilateral primary osteoarthritis, unspecified knee: Secondary | ICD-10-CM | POA: Diagnosis not present

## 2011-09-22 DIAGNOSIS — M1710 Unilateral primary osteoarthritis, unspecified knee: Secondary | ICD-10-CM | POA: Diagnosis not present

## 2011-09-24 DIAGNOSIS — M1710 Unilateral primary osteoarthritis, unspecified knee: Secondary | ICD-10-CM | POA: Diagnosis not present

## 2011-09-29 DIAGNOSIS — M1710 Unilateral primary osteoarthritis, unspecified knee: Secondary | ICD-10-CM | POA: Diagnosis not present

## 2011-10-01 DIAGNOSIS — M1710 Unilateral primary osteoarthritis, unspecified knee: Secondary | ICD-10-CM | POA: Diagnosis not present

## 2011-10-03 DIAGNOSIS — M1710 Unilateral primary osteoarthritis, unspecified knee: Secondary | ICD-10-CM | POA: Diagnosis not present

## 2011-10-08 DIAGNOSIS — M1710 Unilateral primary osteoarthritis, unspecified knee: Secondary | ICD-10-CM | POA: Diagnosis not present

## 2011-10-10 DIAGNOSIS — M1710 Unilateral primary osteoarthritis, unspecified knee: Secondary | ICD-10-CM | POA: Diagnosis not present

## 2011-10-15 DIAGNOSIS — M1710 Unilateral primary osteoarthritis, unspecified knee: Secondary | ICD-10-CM | POA: Diagnosis not present

## 2011-10-21 ENCOUNTER — Other Ambulatory Visit: Payer: Self-pay | Admitting: Family Medicine

## 2011-10-31 ENCOUNTER — Ambulatory Visit: Payer: Medicare Other | Admitting: Family Medicine

## 2011-11-14 DIAGNOSIS — M1710 Unilateral primary osteoarthritis, unspecified knee: Secondary | ICD-10-CM | POA: Diagnosis not present

## 2011-11-28 DIAGNOSIS — M1710 Unilateral primary osteoarthritis, unspecified knee: Secondary | ICD-10-CM | POA: Diagnosis not present

## 2011-12-11 DIAGNOSIS — T84498A Other mechanical complication of other internal orthopedic devices, implants and grafts, initial encounter: Secondary | ICD-10-CM | POA: Diagnosis not present

## 2012-01-23 DIAGNOSIS — M1710 Unilateral primary osteoarthritis, unspecified knee: Secondary | ICD-10-CM | POA: Diagnosis not present

## 2012-01-23 DIAGNOSIS — IMO0002 Reserved for concepts with insufficient information to code with codable children: Secondary | ICD-10-CM | POA: Diagnosis not present

## 2012-02-17 ENCOUNTER — Other Ambulatory Visit: Payer: Self-pay | Admitting: Family Medicine

## 2012-02-20 DIAGNOSIS — M1710 Unilateral primary osteoarthritis, unspecified knee: Secondary | ICD-10-CM | POA: Diagnosis not present

## 2012-02-20 DIAGNOSIS — IMO0002 Reserved for concepts with insufficient information to code with codable children: Secondary | ICD-10-CM | POA: Diagnosis not present

## 2012-03-04 ENCOUNTER — Other Ambulatory Visit: Payer: Self-pay | Admitting: Family Medicine

## 2012-03-09 ENCOUNTER — Other Ambulatory Visit: Payer: Self-pay | Admitting: Orthopedic Surgery

## 2012-03-09 MED ORDER — BUPIVACAINE LIPOSOME 1.3 % IJ SUSP: 20.0000 mL | Freq: Once | INTRAMUSCULAR | Status: DC

## 2012-03-09 MED ORDER — DEXAMETHASONE SODIUM PHOSPHATE 10 MG/ML IJ SOLN: 10.0000 mg | Freq: Once | INTRAMUSCULAR | Status: DC

## 2012-03-09 NOTE — Progress Notes (Signed)
Preoperative surgical orders have been place into the [facility] hospital system for Gregory [name] on 03/09/2012, 5:36 PM  by Patrica Duel for surgery on 04/08/2012.  Preop Knee surgery orders including IV Tylenol and IV Decadron as long as there are no contraindications to the above medications. Avel Peace, PA-C

## 2012-03-16 ENCOUNTER — Encounter: Payer: Self-pay | Admitting: Family Medicine

## 2012-03-16 ENCOUNTER — Ambulatory Visit (INDEPENDENT_AMBULATORY_CARE_PROVIDER_SITE_OTHER): Payer: Medicare Other | Admitting: Family Medicine

## 2012-03-16 VITALS — BP 137/85 | HR 54 | Temp 98.0°F | Ht 67.0 in | Wt 173.2 lb

## 2012-03-16 DIAGNOSIS — L299 Pruritus, unspecified: Secondary | ICD-10-CM

## 2012-03-16 MED ORDER — NEOMYCIN-POLYMYXIN-HC 1 % OT SOLN: 3.0000 [drp] | Freq: Three times a day (TID) | OTIC | Status: DC

## 2012-03-16 NOTE — Assessment & Plan Note (Signed)
Ear itching and pain is acute on chronic.  He has a hx of cleaning ears with Q-tips which causes pain.  On exam, RT external ear tender on palpation and erythematous.  No evidence of middle ear infection.  No fever. - Will treat with Cortisporin ear drops - Encouraged patient to stop cleaning with Q-tips - Follow up as needed if symptoms do not improve in a few weeks

## 2012-03-16 NOTE — Progress Notes (Signed)
  Subjective:    Patient ID: Gregory [name], male    DOB: 09-18-1960, 53 y.o.   MRN: [id number]  HPI  Patient presents to same day clinic for bilateral ear pain, RT worse than LT.  This has happened several years ago and was given Rx for ear drops.  Also endorses bleeding from RT ear that started a few days ago.  Patient's wife tells him to stop using Q-tips to clean ears.  He complains of "ear popping" and buzzing noises in RT ear constantly.  Normal hearing both ears.  Describes as pain as sore and also itchy.  Denies any fevers, chills, nausea/vomiting.  Denies any associated HA, vertigo, rhinorrhea, or watery eyes.  Denies any recent swimming.  Has not taken any analgesics.  Review of Systems  Per HPI    Objective:   Physical Exam  Constitutional: No distress.  HENT:  Head: Normocephalic and atraumatic.  Nose: Nose normal.  Mouth/Throat: Oropharynx is clear and moist.       Ear: both ears are dry, scaly externally; mild tender on palpation with tugging of pinna B/L; TM normal B/L; RT inner canal is red and has healing scab, but no active bleeding or drainage.  Lymphadenopathy:    He has no cervical adenopathy.      Assessment & Plan:

## 2012-03-16 NOTE — Patient Instructions (Addendum)
Please purchase otic drops and apply as directed for 10-14 days. Schedule follow up appointment in 2 weeks if symptoms do not improve. Stop cleaning out ears with Q-tips.  Your ears are both clean.  Otitis Externa Otitis externa is a bacterial or fungal infection of the outer ear canal. This is the area from the eardrum to the outside of the ear. Otitis externa is sometimes called "swimmer's ear." CAUSES  Possible causes of infection include:  Swimming in dirty water.  Moisture remaining in the ear after swimming or bathing.  Mild injury (trauma) to the ear.  Objects stuck in the ear (foreign body).  Cuts or scrapes (abrasions) on the outside of the ear. SYMPTOMS  The first symptom of infection is often itching in the ear canal. Later signs and symptoms may include swelling and redness of the ear canal, ear pain, and yellowish-white fluid (pus) coming from the ear. The ear pain may be worse when pulling on the earlobe. DIAGNOSIS  Your caregiver will perform a physical exam. A sample of fluid may be taken from the ear and examined for bacteria or fungi. TREATMENT  Antibiotic ear drops are often given for 10 to 14 days. Treatment may also include pain medicine or corticosteroids to reduce itching and swelling. PREVENTION   Keep your ear dry. Use the corner of a towel to absorb water out of the ear canal after swimming or bathing.  Avoid scratching or putting objects inside your ear. This can damage the ear canal or remove the protective wax that lines the canal. This makes it easier for bacteria and fungi to grow.  Avoid swimming in lakes, polluted water, or poorly chlorinated pools.  You may use ear drops made of rubbing alcohol and vinegar after swimming. Combine equal parts of white vinegar and alcohol in a bottle. Put 3 or 4 drops into each ear after swimming. HOME CARE INSTRUCTIONS   Apply antibiotic ear drops to the ear canal as prescribed by your caregiver.  Only take  over-the-counter or prescription medicines for pain, discomfort, or fever as directed by your caregiver.  If you have diabetes, follow any additional treatment instructions from your caregiver.  Keep all follow-up appointments as directed by your caregiver. SEEK MEDICAL CARE IF:   You have a fever.  Your ear is still red, swollen, painful, or draining pus after 3 days.  Your redness, swelling, or pain gets worse.  You have a severe headache.  You have redness, swelling, pain, or tenderness in the area behind your ear. MAKE SURE YOU:   Understand these instructions.  Will watch your condition.  Will get help right away if you are not doing well or get worse. Document Released: 02/03/2005 Document Revised: 04/28/2011 Document Reviewed: 02/20/2011 [facility] Patient Information 2013 [place], [place].

## 2012-03-30 ENCOUNTER — Encounter (HOSPITAL_COMMUNITY): Payer: Self-pay | Admitting: Pharmacy Technician

## 2012-04-02 ENCOUNTER — Inpatient Hospital Stay (HOSPITAL_COMMUNITY): Admission: RE | Admit: 2012-04-02 | Payer: Medicare Other | Source: Ambulatory Visit

## 2012-04-04 ENCOUNTER — Emergency Department (HOSPITAL_COMMUNITY)
Admission: EM | Admit: 2012-04-04 | Discharge: 2012-04-04 | Disposition: A | Discharge status: Home / Self Care | Payer: Medicare Other | Attending: Emergency Medicine | Admitting: Emergency Medicine

## 2012-04-04 ENCOUNTER — Encounter (HOSPITAL_COMMUNITY): Payer: Self-pay

## 2012-04-04 DIAGNOSIS — H9319 Tinnitus, unspecified ear: Secondary | ICD-10-CM | POA: Diagnosis not present

## 2012-04-04 DIAGNOSIS — Z8701 Personal history of pneumonia (recurrent): Secondary | ICD-10-CM | POA: Diagnosis not present

## 2012-04-04 DIAGNOSIS — Z87891 Personal history of nicotine dependence: Secondary | ICD-10-CM | POA: Diagnosis not present

## 2012-04-04 DIAGNOSIS — L0201 Cutaneous abscess of face: Secondary | ICD-10-CM | POA: Insufficient documentation

## 2012-04-04 DIAGNOSIS — G4730 Sleep apnea, unspecified: Secondary | ICD-10-CM | POA: Diagnosis not present

## 2012-04-04 DIAGNOSIS — H6093 Unspecified otitis externa, bilateral: Secondary | ICD-10-CM

## 2012-04-04 DIAGNOSIS — L03211 Cellulitis of face: Secondary | ICD-10-CM | POA: Insufficient documentation

## 2012-04-04 DIAGNOSIS — Z8679 Personal history of other diseases of the circulatory system: Secondary | ICD-10-CM | POA: Diagnosis not present

## 2012-04-04 DIAGNOSIS — H60399 Other infective otitis externa, unspecified ear: Secondary | ICD-10-CM | POA: Insufficient documentation

## 2012-04-04 DIAGNOSIS — Z79899 Other long term (current) drug therapy: Secondary | ICD-10-CM | POA: Insufficient documentation

## 2012-04-04 DIAGNOSIS — Z8781 Personal history of (healed) traumatic fracture: Secondary | ICD-10-CM | POA: Diagnosis not present

## 2012-04-04 DIAGNOSIS — K219 Gastro-esophageal reflux disease without esophagitis: Secondary | ICD-10-CM | POA: Insufficient documentation

## 2012-04-04 DIAGNOSIS — Z8739 Personal history of other diseases of the musculoskeletal system and connective tissue: Secondary | ICD-10-CM | POA: Insufficient documentation

## 2012-04-04 MED ORDER — CEPHALEXIN 500 MG PO CAPS: 500.0000 mg | ORAL_CAPSULE | Freq: Four times a day (QID) | ORAL | Status: DC

## 2012-04-04 MED ORDER — CIPROFLOXACIN-DEXAMETHASONE 0.3-0.1 % OT SUSP
4.0000 [drp] | Freq: Once | OTIC | Status: AC
  Administered 2012-04-04: 4 [drp] via OTIC
  Filled 2012-04-04: qty 7.5

## 2012-04-04 MED ORDER — CEPHALEXIN 250 MG PO CAPS
500.0000 mg | ORAL_CAPSULE | Freq: Once | ORAL | Status: AC
  Administered 2012-04-04: 500 mg via ORAL
  Filled 2012-04-04: qty 2

## 2012-04-04 NOTE — ED Notes (Signed)
Patient presents with c/o bilateral ear pain and itching x several weeks. Was seen by PCP on 03/16/12 for the same. Rx Cortisporin ear drops with no relief in sx.
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
## 2012-04-05 ENCOUNTER — Ambulatory Visit (INDEPENDENT_AMBULATORY_CARE_PROVIDER_SITE_OTHER): Payer: Medicare Other | Admitting: Family Medicine

## 2012-04-05 ENCOUNTER — Encounter: Payer: Self-pay | Admitting: Family Medicine

## 2012-04-05 VITALS — BP 126/82 | HR 68 | Temp 98.0°F | Ht 67.0 in | Wt 175.5 lb

## 2012-04-05 DIAGNOSIS — H60399 Other infective otitis externa, unspecified ear: Secondary | ICD-10-CM

## 2012-04-05 DIAGNOSIS — H6090 Unspecified otitis externa, unspecified ear: Secondary | ICD-10-CM | POA: Insufficient documentation

## 2012-04-05 NOTE — Progress Notes (Signed)
  Subjective:    Patient ID: Gregory [name], male    DOB: Aug 02, 1960, 52 y.o.   MRN: [id number]  HPI 1. Otalgia:  Here with c/o of L ear pain.  Was seen on 1/28 and dx with otitis externa, started on cortisporin.  Condition worsened over the past few weeks with ear swelling.  Was seen at ED last pm and diagnosed with bilateral otitis externa as well as facial cellulitis.  Started on ciprodex and keflex last night.  Overnight he has had a rapid improvement in his symptoms, with reduction in pain an swelling.  He has noticed any discharge from the ear.  He is scheduled for upcoming knee surgery on Wednesday.   He denies fever, chills, difficulty hearing.    Review of Systems Per HPI    Objective:   Physical Exam  Constitutional: He appears well-nourished. No distress.  HENT:  Head: Normocephalic and atraumatic.  Mouth/Throat: Oropharynx is clear and moist.  R ear with debris in canal with canal irritation.  L ear with some debris in canal with canal irritation.  There is also an area on the tragus that appears to be a small abscess. This is draining spontaneously.  TM appear normal.   Neck: Neck supple.  Neurological: He is alert.  Skin:  No facial cellulitis seen today.           Assessment & Plan:

## 2012-04-05 NOTE — Patient Instructions (Addendum)
Thank you for coming in today, it was good to see you Continue your antibiotic drops and keflex.  You can use a warm compress on the Ear to help the area drain I think it is ok to proceed with your surgery.   Follow up in 1-2 weeks to be sure this area is healing.

## 2012-04-05 NOTE — ED Provider Notes (Signed)
Medical screening examination/treatment/procedure(s) were performed by non-physician practitioner and as supervising physician I was immediately available for consultation/collaboration.   [name], MD 04/05/12 [phone]

## 2012-04-05 NOTE — Assessment & Plan Note (Signed)
Ears improving. Facial cellulitis appears to be resolved.  There appears to be a small abscess on the tragus of the L ear but this is draining spontaneously.  Advised to complete antibiotics, both drops and orals.  Apply warm compress to tragal area of ear to encourage draining.  Follow up in 1-2 weeks.  I think it is ok for him to undergo surgery from this standpoint.

## 2012-04-06 ENCOUNTER — Encounter (HOSPITAL_COMMUNITY)
Admission: RE | Admit: 2012-04-06 | Discharge: 2012-04-06 | Disposition: A | Discharge status: Home / Self Care | Payer: Medicare Other | Source: Ambulatory Visit | Attending: Orthopedic Surgery | Admitting: Orthopedic Surgery

## 2012-04-06 ENCOUNTER — Encounter (HOSPITAL_COMMUNITY): Payer: Self-pay

## 2012-04-06 DIAGNOSIS — K219 Gastro-esophageal reflux disease without esophagitis: Secondary | ICD-10-CM | POA: Diagnosis not present

## 2012-04-06 DIAGNOSIS — Z96659 Presence of unspecified artificial knee joint: Secondary | ICD-10-CM | POA: Diagnosis not present

## 2012-04-06 DIAGNOSIS — I10 Essential (primary) hypertension: Secondary | ICD-10-CM | POA: Diagnosis not present

## 2012-04-06 DIAGNOSIS — G4730 Sleep apnea, unspecified: Secondary | ICD-10-CM | POA: Diagnosis not present

## 2012-04-06 DIAGNOSIS — Z01812 Encounter for preprocedural laboratory examination: Secondary | ICD-10-CM | POA: Diagnosis not present

## 2012-04-06 DIAGNOSIS — M2469 Ankylosis, other specified joint: Secondary | ICD-10-CM | POA: Diagnosis not present

## 2012-04-06 DIAGNOSIS — Z79899 Other long term (current) drug therapy: Secondary | ICD-10-CM | POA: Diagnosis not present

## 2012-04-06 DIAGNOSIS — M24669 Ankylosis, unspecified knee: Secondary | ICD-10-CM | POA: Diagnosis not present

## 2012-04-06 LAB — CBC
HCT: 39 % (ref 39.0–52.0)
MCH: 28.8 pg (ref 26.0–34.0)
MCV: 86.3 fL (ref 78.0–100.0)
Platelets: 307 10*3/uL (ref 150–400)
RBC: 4.52 MIL/uL (ref 4.22–5.81)
RDW: 12.3 % (ref 11.5–15.5)

## 2012-04-06 NOTE — Patient Instructions (Addendum)
20 Gregory [name]  04/06/2012   Your procedure is scheduled on: 04-08-2012  Report to [facility] at 1015 AM.  Call this number if you have problems the morning of surgery [phone]   Remember:   Do not eat food or drink liquids :After Midnight.     Take these medicines the morning of surgery with A SIP OF WATER: percocet if needed                                SEE [facility] PREPARING FOR SURGERY SHEET   Do not wear jewelry, make-up or nail polish.  Do not wear lotions, powders, or perfumes. You may wear deodorant.   Men may shave face and neck.  Do not bring valuables to the hospital.  Contacts, dentures or bridgework may not be worn into surgery.  Leave suitcase in the car. After surgery it may be brought to your room.  For patients admitted to the hospital, checkout time is 11:00 AM the day of discharge.   Patients discharged the day of surgery will not be allowed to drive home.  Name and phone number of your driver:  Special Instructions: N/A   Please read over the following fact sheets that you were given: MRSA Information.  Call [name] RN pre op nurse if needed [phone]    FAILURE TO FOLLOW THESE INSTRUCTIONS MAY RESULT IN THE CANCELLATION OF YOUR SURGERY. PATIENT SIGNATURE___________________________________________

## 2012-04-08 ENCOUNTER — Encounter (HOSPITAL_COMMUNITY): Payer: Self-pay | Admitting: *Deleted

## 2012-04-08 ENCOUNTER — Encounter (HOSPITAL_COMMUNITY)
Admission: RE | Disposition: A | Discharge status: Home / Self Care | Payer: Self-pay | Source: Ambulatory Visit | Attending: Orthopedic Surgery

## 2012-04-08 ENCOUNTER — Encounter (HOSPITAL_COMMUNITY): Payer: Self-pay | Admitting: Certified Registered Nurse Anesthetist

## 2012-04-08 ENCOUNTER — Ambulatory Visit (HOSPITAL_COMMUNITY): Payer: Medicare Other | Admitting: Certified Registered Nurse Anesthetist

## 2012-04-08 ENCOUNTER — Observation Stay (HOSPITAL_COMMUNITY)
Admission: RE | Admit: 2012-04-08 | Discharge: 2012-04-11 | Disposition: A | Discharge status: Home / Self Care | Payer: Medicare Other | Source: Ambulatory Visit | Attending: Orthopedic Surgery | Admitting: Orthopedic Surgery

## 2012-04-08 DIAGNOSIS — G4730 Sleep apnea, unspecified: Secondary | ICD-10-CM | POA: Insufficient documentation

## 2012-04-08 DIAGNOSIS — M2469 Ankylosis, other specified joint: Secondary | ICD-10-CM | POA: Diagnosis not present

## 2012-04-08 DIAGNOSIS — I10 Essential (primary) hypertension: Secondary | ICD-10-CM | POA: Diagnosis not present

## 2012-04-08 DIAGNOSIS — M24669 Ankylosis, unspecified knee: Principal | ICD-10-CM | POA: Insufficient documentation

## 2012-04-08 DIAGNOSIS — Z96659 Presence of unspecified artificial knee joint: Secondary | ICD-10-CM | POA: Insufficient documentation

## 2012-04-08 DIAGNOSIS — Z9889 Other specified postprocedural states: Secondary | ICD-10-CM

## 2012-04-08 DIAGNOSIS — K219 Gastro-esophageal reflux disease without esophagitis: Secondary | ICD-10-CM | POA: Insufficient documentation

## 2012-04-08 DIAGNOSIS — M24569 Contracture, unspecified knee: Secondary | ICD-10-CM | POA: Diagnosis not present

## 2012-04-08 DIAGNOSIS — Z01812 Encounter for preprocedural laboratory examination: Secondary | ICD-10-CM | POA: Insufficient documentation

## 2012-04-08 DIAGNOSIS — Z79899 Other long term (current) drug therapy: Secondary | ICD-10-CM | POA: Insufficient documentation

## 2012-04-08 HISTORY — PX: KNEE ARTHROTOMY: SHX5881

## 2012-04-08 SURGERY — ARTHROTOMY, KNEE
Anesthesia: General | Site: Knee | Laterality: Left | Wound class: Clean

## 2012-04-08 MED ORDER — METOCLOPRAMIDE HCL 5 MG/ML IJ SOLN: 5.0000 mg | Freq: Three times a day (TID) | INTRAMUSCULAR | Status: DC | PRN

## 2012-04-08 MED ORDER — MORPHINE SULFATE 2 MG/ML IJ SOLN
INTRAMUSCULAR | Status: AC
  Administered 2012-04-08: 1 mg via INTRAVENOUS
  Filled 2012-04-08: qty 1

## 2012-04-08 MED ORDER — ONDANSETRON HCL 4 MG/2ML IJ SOLN: 4.0000 mg | Freq: Four times a day (QID) | INTRAMUSCULAR | Status: DC | PRN

## 2012-04-08 MED ORDER — FENTANYL CITRATE 0.05 MG/ML IJ SOLN
25.0000 ug | INTRAMUSCULAR | Status: DC | PRN
  Administered 2012-04-08 (×3): 50 ug via INTRAVENOUS

## 2012-04-08 MED ORDER — 0.9 % SODIUM CHLORIDE (POUR BTL) OPTIME
TOPICAL | Status: DC | PRN
  Administered 2012-04-08: 1000 mL

## 2012-04-08 MED ORDER — MIDAZOLAM HCL 2 MG/2ML IJ SOLN
1.0000 mg | Freq: Once | INTRAMUSCULAR | Status: AC
  Administered 2012-04-08: 1 mg via INTRAVENOUS

## 2012-04-08 MED ORDER — MORPHINE SULFATE 2 MG/ML IJ SOLN
1.0000 mg | INTRAMUSCULAR | Status: DC | PRN
  Administered 2012-04-08 – 2012-04-09 (×3): 2 mg via INTRAVENOUS
  Filled 2012-04-08 (×3): qty 1

## 2012-04-08 MED ORDER — METOCLOPRAMIDE HCL 10 MG PO TABS: 5.0000 mg | ORAL_TABLET | Freq: Three times a day (TID) | ORAL | Status: DC | PRN

## 2012-04-08 MED ORDER — LIDOCAINE HCL (CARDIAC) 20 MG/ML IV SOLN
INTRAVENOUS | Status: DC | PRN
  Administered 2012-04-08: 100 mg via INTRAVENOUS

## 2012-04-08 MED ORDER — FENTANYL CITRATE 0.05 MG/ML IJ SOLN
100.0000 ug | INTRAMUSCULAR | Status: DC | PRN
  Administered 2012-04-08: 100 ug via INTRAVENOUS

## 2012-04-08 MED ORDER — SODIUM CHLORIDE 0.9 % IV SOLN
INTRAVENOUS | Status: DC
  Administered 2012-04-09: 03:00:00 via INTRAVENOUS

## 2012-04-08 MED ORDER — CEFAZOLIN SODIUM-DEXTROSE 2-3 GM-% IV SOLR
2.0000 g | INTRAVENOUS | Status: AC
  Administered 2012-04-08: 2 g via INTRAVENOUS

## 2012-04-08 MED ORDER — PANTOPRAZOLE SODIUM 40 MG PO TBEC
80.0000 mg | DELAYED_RELEASE_TABLET | Freq: Every day | ORAL | Status: DC
  Administered 2012-04-08: 80 mg via ORAL
  Filled 2012-04-08 (×2): qty 2

## 2012-04-08 MED ORDER — TRAMADOL HCL 50 MG PO TABS
50.0000 mg | ORAL_TABLET | Freq: Four times a day (QID) | ORAL | Status: DC | PRN
  Administered 2012-04-08 – 2012-04-09 (×2): 100 mg via ORAL
  Filled 2012-04-08 (×3): qty 2

## 2012-04-08 MED ORDER — ONDANSETRON HCL 4 MG PO TABS: 4.0000 mg | ORAL_TABLET | Freq: Four times a day (QID) | ORAL | Status: DC | PRN

## 2012-04-08 MED ORDER — ACETAMINOPHEN 10 MG/ML IV SOLN
1000.0000 mg | Freq: Once | INTRAVENOUS | Status: AC
  Administered 2012-04-08: 1000 mg via INTRAVENOUS

## 2012-04-08 MED ORDER — TRAZODONE HCL 50 MG PO TABS
50.0000 mg | ORAL_TABLET | Freq: Every day | ORAL | Status: DC
  Administered 2012-04-08 – 2012-04-10 (×3): 50 mg via ORAL
  Filled 2012-04-08 (×4): qty 1

## 2012-04-08 MED ORDER — LABETALOL HCL 5 MG/ML IV SOLN
INTRAVENOUS | Status: DC | PRN
  Administered 2012-04-08: 5 mg via INTRAVENOUS

## 2012-04-08 MED ORDER — METHOCARBAMOL 500 MG PO TABS
500.0000 mg | ORAL_TABLET | Freq: Four times a day (QID) | ORAL | Status: DC | PRN
  Administered 2012-04-08 – 2012-04-09 (×3): 500 mg via ORAL
  Filled 2012-04-08 (×3): qty 1

## 2012-04-08 MED ORDER — FENTANYL CITRATE 0.05 MG/ML IJ SOLN
INTRAMUSCULAR | Status: DC | PRN
  Administered 2012-04-08: 100 ug via INTRAVENOUS
  Administered 2012-04-08 (×2): 50 ug via INTRAVENOUS

## 2012-04-08 MED ORDER — CIPROFLOXACIN-DEXAMETHASONE 0.3-0.1 % OT SUSP
4.0000 [drp] | Freq: Two times a day (BID) | OTIC | Status: DC
  Administered 2012-04-08 – 2012-04-11 (×6): 4 [drp] via OTIC
  Filled 2012-04-08: qty 7.5

## 2012-04-08 MED ORDER — PROPOFOL 10 MG/ML IV BOLUS
INTRAVENOUS | Status: DC | PRN
  Administered 2012-04-08: 100 mg via INTRAVENOUS

## 2012-04-08 MED ORDER — ENOXAPARIN SODIUM 40 MG/0.4ML ~~LOC~~ SOLN
40.0000 mg | SUBCUTANEOUS | Status: DC
  Administered 2012-04-09 – 2012-04-11 (×3): 40 mg via SUBCUTANEOUS
  Filled 2012-04-08 (×4): qty 0.4

## 2012-04-08 MED ORDER — OXYCODONE HCL 5 MG PO TABS
5.0000 mg | ORAL_TABLET | ORAL | Status: DC | PRN
  Administered 2012-04-08 – 2012-04-09 (×7): 10 mg via ORAL
  Filled 2012-04-08 (×7): qty 2

## 2012-04-08 MED ORDER — LACTATED RINGERS IV SOLN
INTRAVENOUS | Status: DC
Start: 2012-04-08 — End: 2012-04-08
  Administered 2012-04-08: 15:00:00 via INTRAVENOUS

## 2012-04-08 MED ORDER — LACTATED RINGERS IV SOLN
INTRAVENOUS | Status: DC
  Administered 2012-04-08: 13:00:00 via INTRAVENOUS
  Administered 2012-04-08: 1000 mL via INTRAVENOUS

## 2012-04-08 MED ORDER — FLUOXETINE HCL 20 MG PO CAPS
20.0000 mg | ORAL_CAPSULE | Freq: Every day | ORAL | Status: DC
  Administered 2012-04-08 – 2012-04-10 (×3): 20 mg via ORAL
  Filled 2012-04-08 (×4): qty 1

## 2012-04-08 MED ORDER — PROMETHAZINE HCL 25 MG/ML IJ SOLN: 6.2500 mg | INTRAMUSCULAR | Status: DC | PRN

## 2012-04-08 MED ORDER — OLANZAPINE 2.5 MG PO TABS
2.5000 mg | ORAL_TABLET | Freq: Every day | ORAL | Status: DC
  Administered 2012-04-08 – 2012-04-10 (×3): 2.5 mg via ORAL
  Filled 2012-04-08 (×4): qty 1

## 2012-04-08 MED ORDER — PREGABALIN 75 MG PO CAPS
75.0000 mg | ORAL_CAPSULE | Freq: Every day | ORAL | Status: DC
  Administered 2012-04-08 – 2012-04-10 (×3): 75 mg via ORAL
  Filled 2012-04-08 (×3): qty 1

## 2012-04-08 MED ORDER — METHOCARBAMOL 100 MG/ML IJ SOLN
500.0000 mg | Freq: Four times a day (QID) | INTRAVENOUS | Status: DC | PRN
  Filled 2012-04-08: qty 5

## 2012-04-08 MED ORDER — CEFAZOLIN SODIUM 1-5 GM-% IV SOLN
1.0000 g | Freq: Four times a day (QID) | INTRAVENOUS | Status: AC
  Administered 2012-04-08 – 2012-04-09 (×3): 1 g via INTRAVENOUS
  Filled 2012-04-08 (×3): qty 50

## 2012-04-08 MED ORDER — MIDAZOLAM HCL 5 MG/5ML IJ SOLN
INTRAMUSCULAR | Status: DC | PRN
  Administered 2012-04-08: 2 mg via INTRAVENOUS

## 2012-04-08 MED ORDER — DIPHENHYDRAMINE HCL 50 MG/ML IJ SOLN
12.5000 mg | INTRAMUSCULAR | Status: DC | PRN
  Administered 2012-04-08 – 2012-04-09 (×2): 12.5 mg via INTRAVENOUS
  Filled 2012-04-08: qty 1

## 2012-04-08 MED ORDER — SODIUM CHLORIDE 0.9 % IV SOLN: INTRAVENOUS | Status: DC

## 2012-04-08 SURGICAL SUPPLY — 43 items
BAG SPEC THK2 15X12 ZIP CLS (MISCELLANEOUS) ×1
BAG ZIPLOCK 12X15 (MISCELLANEOUS) ×2 IMPLANT
BANDAGE ELASTIC 6 VELCRO ST LF (GAUZE/BANDAGES/DRESSINGS) ×2 IMPLANT
BANDAGE ESMARK 6X9 LF (GAUZE/BANDAGES/DRESSINGS) ×1 IMPLANT
BANDAGE GAUZE ELAST BULKY 4 IN (GAUZE/BANDAGES/DRESSINGS) ×2 IMPLANT
BNDG CMPR 9X6 STRL LF SNTH (GAUZE/BANDAGES/DRESSINGS) ×1
BNDG ESMARK 6X9 LF (GAUZE/BANDAGES/DRESSINGS) ×2
CLOTH BEACON ORANGE TIMEOUT ST (SAFETY) ×2 IMPLANT
CUFF TOURN SGL QUICK 34 (TOURNIQUET CUFF)
CUFF TRNQT CYL 34X4X40X1 (TOURNIQUET CUFF) IMPLANT
DRAPE EXTREMITY T 121X128X90 (DRAPE) ×2 IMPLANT
DRAPE U-SHAPE 47X51 STRL (DRAPES) ×2 IMPLANT
DRSG ADAPTIC 3X8 NADH LF (GAUZE/BANDAGES/DRESSINGS) ×1 IMPLANT
DRSG PAD ABDOMINAL 8X10 ST (GAUZE/BANDAGES/DRESSINGS) ×4 IMPLANT
DURAPREP 26ML APPLICATOR (WOUND CARE) ×2 IMPLANT
ELECT REM PT RETURN 9FT ADLT (ELECTROSURGICAL) ×2
ELECTRODE REM PT RTRN 9FT ADLT (ELECTROSURGICAL) ×1 IMPLANT
GLOVE BIO SURGEON STRL SZ7.5 (GLOVE) ×2 IMPLANT
GLOVE BIO SURGEON STRL SZ8 (GLOVE) ×2 IMPLANT
GLOVE BIOGEL PI IND STRL 8 (GLOVE) ×2 IMPLANT
GLOVE BIOGEL PI INDICATOR 8 (GLOVE) ×2
GOWN STRL NON-REIN LRG LVL3 (GOWN DISPOSABLE) ×2 IMPLANT
GOWN STRL REIN XL XLG (GOWN DISPOSABLE) ×2 IMPLANT
IMMOBILIZER KNEE 20 (SOFTGOODS) ×2
IMMOBILIZER KNEE 20 THIGH 36 (SOFTGOODS) ×1 IMPLANT
KIT BASIN OR (CUSTOM PROCEDURE TRAY) ×2 IMPLANT
MANIFOLD NEPTUNE II (INSTRUMENTS) ×2 IMPLANT
PACK TOTAL JOINT (CUSTOM PROCEDURE TRAY) ×2 IMPLANT
PAD CAST 4YDX4 CTTN HI CHSV (CAST SUPPLIES) ×1 IMPLANT
PADDING CAST ABS 6INX4YD NS (CAST SUPPLIES) ×1
PADDING CAST ABS COTTON 6X4 NS (CAST SUPPLIES) IMPLANT
PADDING CAST COTTON 4X4 STRL (CAST SUPPLIES) ×2
PADDING CAST COTTON 6X4 STRL (CAST SUPPLIES) ×1 IMPLANT
POSITIONER SURGICAL ARM (MISCELLANEOUS) ×2 IMPLANT
SPONGE GAUZE 4X4 12PLY (GAUZE/BANDAGES/DRESSINGS) ×2 IMPLANT
SUT MNCRL AB 4-0 PS2 18 (SUTURE) ×3 IMPLANT
SUT PDS AB 1 CT1 27 (SUTURE) ×2 IMPLANT
SUT VIC AB 2-0 CT1 27 (SUTURE) ×4
SUT VIC AB 2-0 CT1 36 (SUTURE) ×3 IMPLANT
SUT VIC AB 2-0 CT1 TAPERPNT 27 (SUTURE) ×2 IMPLANT
SYR CONTROL 10ML LL (SYRINGE) IMPLANT
TOWEL OR 17X26 10 PK STRL BLUE (TOWEL DISPOSABLE) ×4 IMPLANT
WRAP KNEE MAXI GEL POST OP (GAUZE/BANDAGES/DRESSINGS) ×2 IMPLANT

## 2012-04-08 NOTE — Progress Notes (Signed)
PACU Severe shivering on arrival to PACU. Meds limited due to allergies. Dr.Carignan aware. Orders given.

## 2012-04-08 NOTE — Anesthesia Preprocedure Evaluation (Signed)
Anesthesia Evaluation  Patient identified by MRN, date of birth, ID band Patient awake    Reviewed: Allergy & Precautions, H&P , NPO status , Patient's Chart, lab work & pertinent test results  Airway Mallampati: II TM Distance: >3 FB Neck ROM: Full    Dental no notable dental hx.    Pulmonary neg pulmonary ROS, sleep apnea ,  breath sounds clear to auscultation  Pulmonary exam normal       Cardiovascular hypertension, Pt. on medications negative cardio ROS  Rhythm:Regular Rate:Normal     Neuro/Psych  Headaches, negative neurological ROS  negative psych ROS   GI/Hepatic negative GI ROS, Neg liver ROS, GERD-  Medicated and Controlled,  Endo/Other  negative endocrine ROS  Renal/GU negative Renal ROS  negative genitourinary   Musculoskeletal negative musculoskeletal ROS (+)   Abdominal   Peds negative pediatric ROS (+)  Hematology negative hematology ROS (+) REFUSES BLOOD PRODUCTS, JEHOVAH'S WITNESS  Anesthesia Other Findings   Reproductive/Obstetrics negative OB ROS                           Anesthesia Physical  Anesthesia Plan  ASA: II  Anesthesia Plan: General   Post-op Pain Management:    Induction: Intravenous  Airway Management Planned: LMA  Additional Equipment:   Intra-op Plan:   Post-operative Plan: Extubation in OR  Informed Consent: I have reviewed the patients History and Physical, chart, labs and discussed the procedure including the risks, benefits and alternatives for the proposed anesthesia with the patient or authorized representative who has indicated his/her understanding and acceptance.   Dental advisory given  Plan Discussed with: CRNA  Anesthesia Plan Comments:         Anesthesia Quick Evaluation

## 2012-04-08 NOTE — Op Note (Signed)
NAMEDAWSEN, KRIEGER NO.:  [id number]  MEDICAL RECORD NO.:  [id number]  LOCATION:  1621                         FACILITY:  [facility]  PHYSICIAN:  Ollen Gross, M.D.    DATE OF BIRTH:  [birth year]  DATE OF PROCEDURE:  04/08/2012 DATE OF DISCHARGE:                              OPERATIVE REPORT   PREOPERATIVE DIAGNOSIS:  Arthrofibrosis, left knee.  POSTOPERATIVE DIAGNOSIS:  Arthrofibrosis, left knee.  PROCEDURE:  Left knee arthrotomy with scar excision.  SURGEON:  Ollen Gross, M.D.  ASSISTANT:  Alexzandrew L. Perkins, P.A.C.  ANESTHESIA:  General.  ESTIMATED BLOOD LOSS:  Minimal.  DRAINS:  Hemovac x1.  TOURNIQUET TIME:  34 minutes at 300 mmHg.  COMPLICATIONS:  None.  CONDITION:  Stable to recovery.  BRIEF CLINICAL NOTE:  Geovonni is a 52 year old male who underwent a total knee arthroplasty several months ago.  He had significant limitations in motion prior to the surgery.  He had an uncomplicated total knee, but still has significant range of motion limitations despite adequate physical therapy.  This ranges about 20-70.  He is not a candidate for closed manipulation given the tightness of his scar tissue.  He presents now for arthrotomy and scar excision.  PROCEDURE IN DETAIL:  After successful administration of general anesthetic, a tourniquet was placed high on his left thigh and his left lower extremity was prepped and draped in the usual sterile fashion. The extremity was wrapped in Esmarch, knee flexed, tourniquet inflated to 300 mmHg.  A midline incision was made with a 10 blade to the subcutaneous tissue.  The subcutaneous tissue was scarred down to the muscle and subcutaneous flaps were created circumferentially all the way up the thigh to the tourniquet.  A fresh blade was then used to make a medial parapatellar arthrotomy.  Tremendous amount of scarring present in the joint.  The scar was excised.  We then recreated the medial and lateral  gutters.  We created the infrapatellar space by removing the scar from underneath the patella.  Patella was mobile and freely moving at this point.  We excised scar between the muscle and bone all the way up to the tourniquet and flexed them against gravity and he had about 95 degrees.  Patella tracked normally.  The wound was then copiously irrigated with saline solution.  The arthrotomy was closed over 1 limb of Hemovac drain with interrupted #1 PDS.  Subcutaneous was closed over a second limb of Hemovac drain with interrupted 2-0 Vicryl.  The drains are checked and are freely mobile.  The skin was then closed with staples.  Drains hooked to suction.  Tourniquet released, total time of 35 minutes.  Bulky sterile dressing was then applied and he was awakened and transported to recovery in stable condition.     Ollen Gross, M.D.     FA/MEDQ  D:  04/08/2012  T:  04/08/2012  Job:  [id number]

## 2012-04-08 NOTE — Anesthesia Procedure Notes (Signed)
Procedure Name: LMA Insertion Date/Time: 04/08/2012 1:14 PM Performed by: Hulan Fess Pre-anesthesia Checklist: Patient identified, Emergency Drugs available, Suction available and Timeout performed Patient Re-evaluated:Patient Re-evaluated prior to inductionOxygen Delivery Method: Circle system utilized Preoxygenation: Pre-oxygenation with 100% oxygen Intubation Type: IV induction Ventilation: Mask ventilation without difficulty LMA: LMA inserted and LMA with gastric port inserted LMA Size: 4.0

## 2012-04-08 NOTE — Transfer of Care (Signed)
Immediate Anesthesia Transfer of Care Note  [name]  Procedure(s) Performed: Procedure(s) with comments: LEFT KNEE ARTHROTOMY WITH SCAR EXCISION (Left) - with Scar Excision   Patient Location: PACU  Anesthesia Type:General  Level of Consciousness: awake, alert  and oriented  Airway & Oxygen Therapy: Patient Spontanous Breathing and Patient connected to face mask oxygen  Post-op Assessment: Report given to PACU RN  Post vital signs: Reviewed and stable  Complications: No apparent anesthesia complications

## 2012-04-08 NOTE — Progress Notes (Signed)
PACU Sleeping after meds. Shivering stopped.

## 2012-04-08 NOTE — Brief Op Note (Signed)
04/08/2012  2:11 PM  PATIENT:  Gregory [name]  52 y.o. male  PRE-OPERATIVE DIAGNOSIS:  ARTHROFIBROSIS LEFT KNEE   POST-OPERATIVE DIAGNOSIS:  ARTHROFIBROSIS LEFT KNEE   PROCEDURE:  Procedure(s) with comments: LEFT KNEE ARTHROTOMY WITH SCAR EXCISION (Left) - with Scar Excision   SURGEON:  Surgeon(s) and Role:    * Loanne Drilling, MD - Primary  PHYSICIAN ASSISTANT:   ASSISTANTS: Avel Peace, PA-C   ANESTHESIA:   general  EBL:     BLOOD ADMINISTERED:none  DRAINS: (Medium) Hemovact drain(s) in the left knee with  Suction Open   LOCAL MEDICATIONS USED:  NONE  COUNTS:  YES  TOURNIQUET:   Total Tourniquet Time Documented: Thigh (Left) - 35 minutes Total: Thigh (Left) - 35 minutes   DICTATION: .Other Dictation: Dictation Number [id number]  PLAN OF CARE: Admit for overnight observation  PATIENT DISPOSITION:  PACU - hemodynamically stable.

## 2012-04-08 NOTE — Progress Notes (Signed)
Pt has had no adverse reaction to 1600 dose of morphine. Tolerated well and asked for another dose of pain medicine. Will continue to monitor patient.

## 2012-04-08 NOTE — Progress Notes (Signed)
Pt shaking and moaning out of pain. Limited on what can be given for pain due to allergies. Pt reports he reacts to morphine by becoming "itchy." Pt asked nurse to call MD and tell him, "I'd rather itch than feel this way." Paged PA to ask about IV medicine. New orders received.

## 2012-04-08 NOTE — Interval H&P Note (Signed)
History and Physical Interval Note:  04/08/2012 12:59 PM  Gregory [name]  has presented today for surgery, with the diagnosis of ARTHROFIBROSIS LEFT KNEE   The various methods of treatment have been discussed with the patient and family. After consideration of risks, benefits and other options for treatment, the patient has consented to  Procedure(s) with comments: LEFT KNEE ARTHROTOMY WITH SCAR EXCISION (Left) - with Scar Excision  as a surgical intervention .  The patient's history has been reviewed, patient examined, no change in status, stable for surgery.  I have reviewed the patient's chart and labs.  Questions were answered to the patient's satisfaction.     Loanne Drilling

## 2012-04-08 NOTE — Anesthesia Postprocedure Evaluation (Signed)
  Anesthesia Post-op Note  [name]  Procedure(s) Performed: Procedure(s) (LRB): LEFT KNEE ARTHROTOMY WITH SCAR EXCISION (Left)  Patient Location: PACU  Anesthesia Type: General  Level of Consciousness: awake and alert   Airway and Oxygen Therapy: Patient Spontanous Breathing  Post-op Pain: mild  Post-op Assessment: Post-op Vital signs reviewed, Patient's Cardiovascular Status Stable, Respiratory Function Stable, Patent Airway and No signs of Nausea or vomiting  Last Vitals:  Filed Vitals:   04/08/12 1500  BP: 138/100  Pulse: 66  Temp:   Resp: 9    Post-op Vital Signs: stable   Complications: No apparent anesthesia complications
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
## 2012-04-09 ENCOUNTER — Encounter (HOSPITAL_COMMUNITY): Payer: Self-pay | Admitting: Orthopedic Surgery

## 2012-04-09 DIAGNOSIS — Z96659 Presence of unspecified artificial knee joint: Secondary | ICD-10-CM | POA: Diagnosis not present

## 2012-04-09 DIAGNOSIS — M2469 Ankylosis, other specified joint: Secondary | ICD-10-CM | POA: Diagnosis not present

## 2012-04-09 DIAGNOSIS — G4730 Sleep apnea, unspecified: Secondary | ICD-10-CM | POA: Diagnosis not present

## 2012-04-09 DIAGNOSIS — I10 Essential (primary) hypertension: Secondary | ICD-10-CM | POA: Diagnosis not present

## 2012-04-09 DIAGNOSIS — M24669 Ankylosis, unspecified knee: Secondary | ICD-10-CM | POA: Diagnosis not present

## 2012-04-09 DIAGNOSIS — Z79899 Other long term (current) drug therapy: Secondary | ICD-10-CM | POA: Diagnosis not present

## 2012-04-09 DIAGNOSIS — K219 Gastro-esophageal reflux disease without esophagitis: Secondary | ICD-10-CM | POA: Diagnosis not present

## 2012-04-09 MED ORDER — NON FORMULARY: 60.0000 mg | Freq: Every day | Status: DC

## 2012-04-09 MED ORDER — ACETAMINOPHEN 10 MG/ML IV SOLN
1000.0000 mg | Freq: Four times a day (QID) | INTRAVENOUS | Status: AC
  Administered 2012-04-09 – 2012-04-10 (×3): 1000 mg via INTRAVENOUS
  Filled 2012-04-09 (×5): qty 100

## 2012-04-09 MED ORDER — DIAZEPAM 5 MG PO TABS
5.0000 mg | ORAL_TABLET | Freq: Four times a day (QID) | ORAL | Status: DC | PRN
  Administered 2012-04-09: 2.5 mg via ORAL
  Administered 2012-04-09 – 2012-04-11 (×4): 5 mg via ORAL
  Filled 2012-04-09 (×5): qty 1

## 2012-04-09 MED ORDER — OXYCODONE HCL 5 MG PO TABS: 5.0000 mg | ORAL_TABLET | ORAL | Status: DC | PRN

## 2012-04-09 MED ORDER — ACETAMINOPHEN 325 MG PO TABS
650.0000 mg | ORAL_TABLET | Freq: Four times a day (QID) | ORAL | Status: DC | PRN
  Administered 2012-04-09 – 2012-04-10 (×2): 650 mg via ORAL
  Filled 2012-04-09 (×3): qty 2

## 2012-04-09 MED ORDER — KETOROLAC TROMETHAMINE 15 MG/ML IJ SOLN
INTRAMUSCULAR | Status: AC
  Filled 2012-04-09: qty 1

## 2012-04-09 MED ORDER — DIAZEPAM 5 MG PO TABS: 5.0000 mg | ORAL_TABLET | Freq: Four times a day (QID) | ORAL | Status: DC | PRN

## 2012-04-09 MED ORDER — OXYCODONE HCL 5 MG PO TABS
5.0000 mg | ORAL_TABLET | ORAL | Status: DC | PRN
  Administered 2012-04-09 – 2012-04-11 (×8): 15 mg via ORAL
  Filled 2012-04-09 (×8): qty 3

## 2012-04-09 MED ORDER — BLISTEX EX OINT
TOPICAL_OINTMENT | CUTANEOUS | Status: AC
  Administered 2012-04-09: 08:00:00
  Filled 2012-04-09: qty 10

## 2012-04-09 MED ORDER — DEXLANSOPRAZOLE 60 MG PO CPDR
60.0000 mg | DELAYED_RELEASE_CAPSULE | Freq: Every day | ORAL | Status: DC
  Administered 2012-04-09 – 2012-04-10 (×2): 60 mg via ORAL
  Filled 2012-04-09 (×3): qty 1

## 2012-04-09 MED ORDER — KETOROLAC TROMETHAMINE 15 MG/ML IJ SOLN
15.0000 mg | Freq: Once | INTRAMUSCULAR | Status: AC
  Filled 2012-04-09: qty 1

## 2012-04-09 MED ORDER — DIAZEPAM 5 MG PO TABS
5.0000 mg | ORAL_TABLET | Freq: Four times a day (QID) | ORAL | Status: DC | PRN
  Administered 2012-04-09 (×2): 5 mg via ORAL
  Filled 2012-04-09 (×2): qty 1

## 2012-04-09 NOTE — Evaluation (Signed)
Physical Therapy Evaluation Patient Details Name: Gregory [name] MRN: [id number] DOB: 1960-05-24 Today's Date: 04/09/2012 Time: 9563-8756 PT Time Calculation (min): 33 min  PT Assessment / Plan / Recommendation Clinical Impression  52 yo male s/p L knee arthrotomy, scar excision. Surgery #5 per pt/wife. Multiple attemtps to work with pt on today however patient was in severe pain. After IV pain meds, pt was able to participate. On eval, pt was Min-Min guard assist for mobility-able to ambulate ~120 feet with RW. Tolerated a few ROM exercises and Mod encouragment for CPM. Recommend HHPT.     PT Assessment  Patient needs continued PT services    Follow Up Recommendations  Home health PT    Does the patient have the potential to tolerate intense rehabilitation      Barriers to Discharge        Equipment Recommendations  None recommended by PT    Recommendations for Other Services     Frequency 7X/week    Precautions / Restrictions Precautions Precautions: Knee Required Braces or Orthoses: Knee Immobilizer - Left Knee Immobilizer - Left: Discontinue once straight leg raise with < 10 degree lag Restrictions Weight Bearing Restrictions: No LLE Weight Bearing: Weight bearing as tolerated   Pertinent Vitals/Pain 5/10 L knee with activity      Mobility  Bed Mobility Bed Mobility: Supine to Sit;Sit to Supine Supine to Sit: 4: Min assist Sit to Supine: 4: Min assist Details for Bed Mobility Assistance: Assist for L LE off/onto bed. Pt prefers to hook L LE with R LE. Transfers Transfers: Sit to Stand;Stand to Sit Sit to Stand: 4: Min assist;From bed Stand to Sit: 4: Min guard;To bed Details for Transfer Assistance: VCs safety, technique, hand placement. Assist to rise, stabilize.  Ambulation/Gait Ambulation/Gait Assistance: 4: Min guard Ambulation Distance (Feet): 120 Feet Assistive device: Rolling walker Ambulation/Gait Assistance Details: VCs safety, technique, sequence.  Slow gait speed. Pt usually wears llift in L shoe so ambulates on toes.  Gait Pattern: Step-to pattern;Antalgic;Decreased stride length    Exercises Total Joint Exercises Ankle Circles/Pumps: AROM;Both;20 reps;Supine Heel Slides: AAROM;Left;5 reps;Supine (pt used sheet) Hip ABduction/ADduction: AAROM;Left;10 reps;Supine (pt uses R LE to assist) Straight Leg Raises: AAROM;Left;5 reps;Supine (pt used R LE to assist)   PT Diagnosis: Difficulty walking;Abnormality of gait;Acute pain  PT Problem List: Decreased strength;Decreased range of motion;Decreased mobility;Decreased knowledge of use of DME;Decreased activity tolerance PT Treatment Interventions: DME instruction;Gait training;Stair training;Functional mobility training;Therapeutic activities;Therapeutic exercise;Patient/family education   PT Goals Acute Rehab PT Goals PT Goal Formulation: With patient Time For Goal Achievement: 04/16/12 Potential to Achieve Goals: Good Pt will go Supine/Side to Sit: with supervision PT Goal: Supine/Side to Sit - Progress: Goal set today Pt will go Sit to Supine/Side: with supervision PT Goal: Sit to Supine/Side - Progress: Goal set today Pt will go Sit to Stand: with supervision PT Goal: Sit to Stand - Progress: Goal set today Pt will Ambulate: 51 - 150 feet;with supervision;with rolling walker PT Goal: Ambulate - Progress: Goal set today Pt will Go Up / Down Stairs: with least restrictive assistive device;3-5 stairs PT Goal: Up/Down Stairs - Progress: Goal set today  Visit Information  Last PT Received On: 04/09/12 Assistance Needed: +1    Subjective Data  Subjective: Im sorry but my leg was hurting Patient Stated Goal: less pain.    Prior Functioning  Home Living Lives With: Spouse Available Help at Discharge: Family Home Access: Stairs to enter Prior Function Level of Independence: Independent with assistive  device(s) Communication Communication: No difficulties    Cognition   Cognition Overall Cognitive Status: Appears within functional limits for tasks assessed/performed Arousal/Alertness: Awake/alert Orientation Level: Appears intact for tasks assessed Behavior During Session: [facility] for tasks performed    Extremity/Trunk Assessment Left Lower Extremity Assessment LLE ROM/Strength/Tone: Unable to fully assess;Due to pain;Deficits LLE ROM/Strength/Tone Deficits: hip flex 2/5, hip abd/add 2/5, moves ankle well. Pt has been experiencing severe pain on today   Balance    End of Session PT - End of Session Equipment Utilized During Treatment: Gait belt;Left knee immobilizer Activity Tolerance: Patient limited by pain Patient left: in bed;with call bell/phone within reach  GP Functional Assessment Tool Used: Clinical judgment Functional Limitation: Mobility: Walking and moving around Mobility: Walking and Moving Around Current Status (Z6109): At least 20 percent but less than 40 percent impaired, limited or restricted Mobility: Walking and Moving Around Goal Status [id number]): At least 1 percent but less than 20 percent impaired, limited or restricted   [name] [facility] 04/09/2012, 4:34 PM [phone]

## 2012-04-09 NOTE — Progress Notes (Signed)
PT NOTE  Checked back on pt to attempt PT evaluation/OOB. Pt reports having severe pain. Pt is very tense,grimacing, squeezing bed rails in pain at rest in bed. Do not feel pt is going to tolerate mobility at this time. RN aware and has medicated pt. Ice packs applied-informed wife to remove packs if needed/pt requests. Will check back at a later time today to attempt eval for the 3rd time. If pt is unable, will check back on tomorrow. Thanks. [name], PT [phone]
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
## 2012-04-09 NOTE — Care Management Note (Addendum)
    Page 1 of 2   04/11/2012     3:38:05 PM   CARE MANAGEMENT NOTE 04/11/2012  Patient:  Gregory [name], Gregory [name]   Account Number:  [id number]  Date Initiated:  04/09/2012  Documentation initiated by:  Colleen Can  Subjective/Objective Assessment:   dx arthrofibrosis left knee; arthrotomy with scar revision     Action/Plan:   Spouse states plans ae for patient to return to his home with hh services. Already has RW. Wants to use [place] for HHpt services.   Anticipated DC Date:  04/11/2012   Anticipated DC Plan:  HOME W HOME HEALTH SERVICES      DC Planning Services  CM consult      [facility] Choice  HOME HEALTH   Choice offered to / List presented to:  C-3 Spouse   DME arranged  Levan Hurst      DME agency  [facility]     HH arranged  HH-2 PT      [facility] agency  [facility]   Status of service:  Completed, signed off Medicare Important Message given?   (If response is "NO", the following Medicare IM given date fields will be blank) Date Medicare IM given:   Date Additional Medicare IM given:    Discharge Disposition:  HOME W HOME HEALTH SERVICES  Per UR Regulation:  Reviewed for med. necessity/level of care/duration of stay  If discussed at Long Length of Stay Meetings, dates discussed:    Comments:  04/11/12 Dave Mergen RN,BSN NCM [phone] [facility](REP)FAXED/CONFIRMATION HHPT ORDER,D/C SUMMARY,D/C ORDER.INFORMED BT PATIENT/SPOUSE THAT PRIOR RW WAS NOT @ HOME. ASKED IF THEY REPORTED IT TO POLICE OR FILED A LOST CLAIM,THEY SAID NO.EXPLAINED THAT IF PRIOR RW WAS LESS THAN 5 YEARS THAT THEY RECEIVED IT,THEY WOULD BE CHARGED THE COST FEE IF NEEDED ANOTHER ONE.THEY SAID THEY WOULD LOOK FOR IT BEFORE THEY BOUGHT ANOTHER ONE.UPON FURTHR REVIEW FOUND THAT PER [facility] DME REP NO RW WAS DELIVERED.RW FOR HOME ORDER PUT IN,& [facility] DME WILL DELIVER RW TO PATIENT'S HOME,LEFT VM MESSAGE FOR PATIENT @ HOME.  04/09/2012 Colleen Can BSN RN CCM [phone] Spoke with  [facility] rep who advised that they will be able to provide [facility] services when pat is discharged.

## 2012-04-09 NOTE — Progress Notes (Signed)
1415 Patient c/o increased pain and spasm, c/o headache b/p 124/84 manual, patient screaming out moaning and asking everyone to leave him alone, wife at bedside states husband does have issues with pain but does not seem like himself.  Gareth Eagle PA paged with orders received  D Doran Durand

## 2012-04-09 NOTE — Progress Notes (Signed)
PT NOTE  Order received. Chart reviewed. Attempted PT eval. Pt declines OOB at this time due to pain. Prefers for PT to check back after next time pain meds are given ~ between 130 and 200. Recommended pt/wife double check with RN about timing of meds. Will check back later. Thanks. [name], PT [phone]

## 2012-04-10 DIAGNOSIS — M24669 Ankylosis, unspecified knee: Secondary | ICD-10-CM | POA: Diagnosis not present

## 2012-04-10 DIAGNOSIS — K219 Gastro-esophageal reflux disease without esophagitis: Secondary | ICD-10-CM | POA: Diagnosis not present

## 2012-04-10 DIAGNOSIS — Z79899 Other long term (current) drug therapy: Secondary | ICD-10-CM | POA: Diagnosis not present

## 2012-04-10 DIAGNOSIS — M2469 Ankylosis, other specified joint: Secondary | ICD-10-CM | POA: Diagnosis not present

## 2012-04-10 DIAGNOSIS — I10 Essential (primary) hypertension: Secondary | ICD-10-CM | POA: Diagnosis not present

## 2012-04-10 DIAGNOSIS — Z96659 Presence of unspecified artificial knee joint: Secondary | ICD-10-CM | POA: Diagnosis not present

## 2012-04-10 DIAGNOSIS — G4730 Sleep apnea, unspecified: Secondary | ICD-10-CM | POA: Diagnosis not present

## 2012-04-10 MED ORDER — KETOROLAC TROMETHAMINE 15 MG/ML IJ SOLN
15.0000 mg | Freq: Once | INTRAMUSCULAR | Status: AC
  Administered 2012-04-10: 15 mg via INTRAVENOUS

## 2012-04-10 MED ORDER — KETOROLAC TROMETHAMINE 15 MG/ML IJ SOLN
15.0000 mg | Freq: Four times a day (QID) | INTRAMUSCULAR | Status: DC | PRN
Start: 2012-04-10 — End: 2012-04-11
  Administered 2012-04-10: 15 mg via INTRAVENOUS
  Filled 2012-04-10: qty 1

## 2012-04-10 NOTE — Progress Notes (Signed)
Pt in severe pain; PA notified & order received. Crytal Pensinger, [employer]
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
## 2012-04-10 NOTE — Progress Notes (Signed)
Subjective: 2 Days Post-Op Procedure(s) (LRB): LEFT KNEE ARTHROTOMY WITH SCAR EXCISION (Left) Patient reports pain as 5 on 0-10 scale. Dressing Changed and wound looks fine.   Objective: Vital signs in last 24 hours: Temp:  [98.3 F (36.8 C)-99.9 F (37.7 C)] 98.3 F (36.8 C) (02/22 0501) Pulse Rate:  [71-100] 71 (02/22 0501) Resp:  [16-30] 16 (02/22 0501) BP: (104-132)/(67-86) 104/67 mmHg (02/22 0501) SpO2:  [94 %-98 %] 95 % (02/22 0501)  Intake/Output from previous day: 02/21 0701 - 02/22 0700 In: 810 [P.O.:360; I.V.:250; IV Piggyback:200] Out: 2600 [Urine:2600] Intake/Output this shift:    No results found for this basename: HGB,  in the last 72 hours No results found for this basename: WBC, RBC, HCT, PLT,  in the last 72 hours No results found for this basename: NA, K, CL, CO2, BUN, CREATININE, GLUCOSE, CALCIUM,  in the last 72 hours No results found for this basename: LABPT, INR,  in the last 72 hours  Neurologically intact No cellulitis present  Assessment/Plan: 2 Days Post-Op Procedure(s) (LRB): LEFT KNEE ARTHROTOMY WITH SCAR EXCISION (Left) Up with therapy  Arieana Somoza A 04/10/2012, 8:14 AM

## 2012-04-11 ENCOUNTER — Other Ambulatory Visit: Payer: Self-pay | Admitting: Family Medicine

## 2012-04-11 DIAGNOSIS — Z96659 Presence of unspecified artificial knee joint: Secondary | ICD-10-CM | POA: Diagnosis not present

## 2012-04-11 DIAGNOSIS — G4730 Sleep apnea, unspecified: Secondary | ICD-10-CM | POA: Diagnosis not present

## 2012-04-11 DIAGNOSIS — K219 Gastro-esophageal reflux disease without esophagitis: Secondary | ICD-10-CM | POA: Diagnosis not present

## 2012-04-11 DIAGNOSIS — I10 Essential (primary) hypertension: Secondary | ICD-10-CM | POA: Diagnosis not present

## 2012-04-11 DIAGNOSIS — Z79899 Other long term (current) drug therapy: Secondary | ICD-10-CM | POA: Diagnosis not present

## 2012-04-11 DIAGNOSIS — M2469 Ankylosis, other specified joint: Secondary | ICD-10-CM | POA: Diagnosis not present

## 2012-04-11 DIAGNOSIS — M24669 Ankylosis, unspecified knee: Secondary | ICD-10-CM | POA: Diagnosis not present

## 2012-04-11 MED ORDER — SENNOSIDES 8.6 MG PO TABS: 2.0000 | ORAL_TABLET | Freq: Every day | ORAL | Status: DC

## 2012-04-11 MED ORDER — DOCUSATE SODIUM 100 MG PO CAPS: 100.0000 mg | ORAL_CAPSULE | Freq: Two times a day (BID) | ORAL | Status: DC

## 2012-04-11 NOTE — Progress Notes (Signed)
Subjective: 3 Days Post-Op Procedure(s) (LRB): LEFT KNEE ARTHROTOMY WITH SCAR EXCISION (Left) Patient reports pain as moderate.  Pt c/o spasms in the quad that are the most troublesome.  Objective: Vital signs in last 24 hours: Temp:  [99 F (37.2 C)-99.4 F (37.4 C)] 99.1 F (37.3 C) (02/23 0530) Pulse Rate:  [81-96] 81 (02/23 0530) Resp:  [16-18] 18 (02/23 0530) BP: (106-136)/(70-90) 121/74 mmHg (02/23 0530) SpO2:  [92 %-95 %] 92 % (02/23 0530)  Intake/Output from previous day: 02/22 0701 - 02/23 0700 In: 700 [P.O.:600; IV Piggyback:100] Out: 1650 [Urine:1650] Intake/Output this shift:    No results found for this basename: HGB,  in the last 72 hours No results found for this basename: WBC, RBC, HCT, PLT,  in the last 72 hours No results found for this basename: NA, K, CL, CO2, BUN, CREATININE, GLUCOSE, CALCIUM,  in the last 72 hours No results found for this basename: LABPT, INR,  in the last 72 hours  L knee wound dressed and dry.  Intact sens to LT at dorsal foot.  2+ dp pulses.  5/5 strength at quad and tib ant.  Assessment/Plan: 3 Days Post-Op Procedure(s) (LRB): LEFT KNEE ARTHROTOMY WITH SCAR EXCISION (Left) Discharge home with home health  Toni Arthurs 04/11/2012, 9:08 AM

## 2012-04-11 NOTE — Progress Notes (Signed)
Discharged from floor via w/c, wife with pt. No changes in assessment. Gregory [name]  

## 2012-04-11 NOTE — Progress Notes (Signed)
Physical Therapy Treatment Patient Details Name: Gregory [name] MRN: [id number] DOB: 06-13-1960 Today's Date: 04/11/2012 Time: 4696-2952 PT Time Calculation (min): 24 min  PT Assessment / Plan / Recommendation Comments on Treatment Session  Progressing well with mobility. Pain under better control today although pt rates it 7/10. Completed all education. Performed ROM exercises, step training, and ambulation. Tolerated all activity well. Wife present. No further questions/concerns from pt/wife. Issued exercise sheet and instructed pt to perform  exercises one more time this evening at home. Recommend HHPT.     Follow Up Recommendations  Home health PT     Does the patient have the potential to tolerate intense rehabilitation     Barriers to Discharge        Equipment Recommendations  None recommended by PT    Recommendations for Other Services    Frequency 7X/week   Plan Discharge plan remains appropriate    Precautions / Restrictions Precautions Precautions: Knee Required Braces or Orthoses: Knee Immobilizer - Left Knee Immobilizer - Left: Discontinue once straight leg raise with < 10 degree lag Restrictions Weight Bearing Restrictions: No LLE Weight Bearing: Weight bearing as tolerated   Pertinent Vitals/Pain 7/10 L knee with activity    Mobility  Bed Mobility Bed Mobility: Supine to Sit;Sit to Supine Supine to Sit: 4: Min guard Sit to Supine: 4: Min guard Details for Bed Mobility Assistance: Pt prefers to hool L LE with R LE Transfers Transfers: Sit to Stand;Stand to Sit Sit to Stand: 4: Min guard;From bed Stand to Sit: 4: Min guard;To bed Details for Transfer Assistance: VCs safety, technique, hand placement.  Ambulation/Gait Ambulation/Gait Assistance: 5: Supervision Ambulation Distance (Feet): 150 Feet Assistive device: Rolling walker Gait Pattern: Antalgic;Decreased stride length;Step-to pattern Stairs: Yes Stairs Assistance: 5: Supervision Stair Management  Technique: No rails;Backwards;With walker;Step to pattern Number of Stairs: 2 (1x2)    Exercises Total Joint Exercises Ankle Circles/Pumps: AROM;Both;20 reps;Supine Quad Sets: AROM;Left;10 reps;Supine Heel Slides: AAROM;Left;10 reps;Supine (pt used sheet to assist) Hip ABduction/ADduction: AROM;5 reps;Supine;Left Straight Leg Raises: AROM;5 reps;Supine;Left   PT Diagnosis:    PT Problem List:   PT Treatment Interventions:     PT Goals Acute Rehab PT Goals Pt will go Supine/Side to Sit: with supervision PT Goal: Supine/Side to Sit - Progress: Progressing toward goal Pt will go Sit to Supine/Side: with supervision PT Goal: Sit to Supine/Side - Progress: Progressing toward goal Pt will go Sit to Stand: with supervision PT Goal: Sit to Stand - Progress: Progressing toward goal Pt will Ambulate: 51 - 150 feet;with supervision;with rolling walker PT Goal: Ambulate - Progress: Met Pt will Go Up / Down Stairs: 1-2 stairs;with supervision;with least restrictive assistive device PT Goal: Up/Down Stairs - Progress: Met  Visit Information  Last PT Received On: 04/11/12 Assistance Needed: +1    Subjective Data  Subjective: She's(wife) not going to let me do too much Patient Stated Goal: less pain. home   Cognition  Cognition Overall Cognitive Status: Appears within functional limits for tasks assessed/performed Arousal/Alertness: Awake/[name] Orientation Level: Appears intact for tasks assessed Behavior During Session: [facility] for tasks performed    Balance     End of Session PT - End of Session Equipment Utilized During Treatment: Left knee immobilizer Activity Tolerance: Patient tolerated treatment well Patient left: in bed;with call bell/phone within reach;with family/visitor present CPM Left Knee CPM Left Knee: Off   GP     Gregory [name] [facility] 04/11/2012, 10:15 AM [phone]

## 2012-04-21 DIAGNOSIS — Z4789 Encounter for other orthopedic aftercare: Secondary | ICD-10-CM | POA: Diagnosis not present

## 2012-04-21 DIAGNOSIS — S0990XA Unspecified injury of head, initial encounter: Secondary | ICD-10-CM | POA: Diagnosis not present

## 2012-04-21 DIAGNOSIS — F0789 Other personality and behavioral disorders due to known physiological condition: Secondary | ICD-10-CM | POA: Diagnosis not present

## 2012-04-21 DIAGNOSIS — R269 Unspecified abnormalities of gait and mobility: Secondary | ICD-10-CM | POA: Diagnosis not present

## 2012-04-21 DIAGNOSIS — IMO0001 Reserved for inherently not codable concepts without codable children: Secondary | ICD-10-CM | POA: Diagnosis not present

## 2012-04-22 DIAGNOSIS — F0789 Other personality and behavioral disorders due to known physiological condition: Secondary | ICD-10-CM | POA: Diagnosis not present

## 2012-04-22 DIAGNOSIS — R269 Unspecified abnormalities of gait and mobility: Secondary | ICD-10-CM | POA: Diagnosis not present

## 2012-04-22 DIAGNOSIS — Z4789 Encounter for other orthopedic aftercare: Secondary | ICD-10-CM | POA: Diagnosis not present

## 2012-04-22 DIAGNOSIS — IMO0001 Reserved for inherently not codable concepts without codable children: Secondary | ICD-10-CM | POA: Diagnosis not present

## 2012-04-22 DIAGNOSIS — S0990XA Unspecified injury of head, initial encounter: Secondary | ICD-10-CM | POA: Diagnosis not present
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
## 2012-04-26 DIAGNOSIS — Z4789 Encounter for other orthopedic aftercare: Secondary | ICD-10-CM | POA: Diagnosis not present

## 2012-04-26 DIAGNOSIS — R269 Unspecified abnormalities of gait and mobility: Secondary | ICD-10-CM | POA: Diagnosis not present

## 2012-04-26 DIAGNOSIS — IMO0001 Reserved for inherently not codable concepts without codable children: Secondary | ICD-10-CM | POA: Diagnosis not present

## 2012-04-26 DIAGNOSIS — S0990XA Unspecified injury of head, initial encounter: Secondary | ICD-10-CM | POA: Diagnosis not present

## 2012-04-26 DIAGNOSIS — F0789 Other personality and behavioral disorders due to known physiological condition: Secondary | ICD-10-CM | POA: Diagnosis not present

## 2012-04-27 DIAGNOSIS — F0789 Other personality and behavioral disorders due to known physiological condition: Secondary | ICD-10-CM | POA: Diagnosis not present

## 2012-04-27 DIAGNOSIS — IMO0001 Reserved for inherently not codable concepts without codable children: Secondary | ICD-10-CM | POA: Diagnosis not present

## 2012-04-27 DIAGNOSIS — Z4789 Encounter for other orthopedic aftercare: Secondary | ICD-10-CM | POA: Diagnosis not present

## 2012-04-27 DIAGNOSIS — R269 Unspecified abnormalities of gait and mobility: Secondary | ICD-10-CM | POA: Diagnosis not present

## 2012-04-27 DIAGNOSIS — S0990XA Unspecified injury of head, initial encounter: Secondary | ICD-10-CM | POA: Diagnosis not present

## 2012-04-28 DIAGNOSIS — R269 Unspecified abnormalities of gait and mobility: Secondary | ICD-10-CM | POA: Diagnosis not present

## 2012-04-28 DIAGNOSIS — Z4789 Encounter for other orthopedic aftercare: Secondary | ICD-10-CM | POA: Diagnosis not present

## 2012-04-28 DIAGNOSIS — IMO0001 Reserved for inherently not codable concepts without codable children: Secondary | ICD-10-CM | POA: Diagnosis not present

## 2012-04-28 DIAGNOSIS — F0789 Other personality and behavioral disorders due to known physiological condition: Secondary | ICD-10-CM | POA: Diagnosis not present

## 2012-04-28 DIAGNOSIS — S0990XA Unspecified injury of head, initial encounter: Secondary | ICD-10-CM | POA: Diagnosis not present

## 2012-04-29 DIAGNOSIS — F0789 Other personality and behavioral disorders due to known physiological condition: Secondary | ICD-10-CM | POA: Diagnosis not present

## 2012-04-29 DIAGNOSIS — IMO0001 Reserved for inherently not codable concepts without codable children: Secondary | ICD-10-CM | POA: Diagnosis not present

## 2012-04-29 DIAGNOSIS — Z4789 Encounter for other orthopedic aftercare: Secondary | ICD-10-CM | POA: Diagnosis not present

## 2012-04-29 DIAGNOSIS — S0990XA Unspecified injury of head, initial encounter: Secondary | ICD-10-CM | POA: Diagnosis not present

## 2012-04-29 DIAGNOSIS — R269 Unspecified abnormalities of gait and mobility: Secondary | ICD-10-CM | POA: Diagnosis not present

## 2012-04-30 DIAGNOSIS — F0789 Other personality and behavioral disorders due to known physiological condition: Secondary | ICD-10-CM | POA: Diagnosis not present

## 2012-04-30 DIAGNOSIS — S0990XA Unspecified injury of head, initial encounter: Secondary | ICD-10-CM | POA: Diagnosis not present

## 2012-04-30 DIAGNOSIS — Z4789 Encounter for other orthopedic aftercare: Secondary | ICD-10-CM | POA: Diagnosis not present

## 2012-04-30 DIAGNOSIS — IMO0001 Reserved for inherently not codable concepts without codable children: Secondary | ICD-10-CM | POA: Diagnosis not present

## 2012-04-30 DIAGNOSIS — R269 Unspecified abnormalities of gait and mobility: Secondary | ICD-10-CM | POA: Diagnosis not present

## 2012-05-03 DIAGNOSIS — IMO0001 Reserved for inherently not codable concepts without codable children: Secondary | ICD-10-CM | POA: Diagnosis not present

## 2012-05-03 DIAGNOSIS — S0990XA Unspecified injury of head, initial encounter: Secondary | ICD-10-CM | POA: Diagnosis not present

## 2012-05-03 DIAGNOSIS — F0789 Other personality and behavioral disorders due to known physiological condition: Secondary | ICD-10-CM | POA: Diagnosis not present

## 2012-05-03 DIAGNOSIS — R269 Unspecified abnormalities of gait and mobility: Secondary | ICD-10-CM | POA: Diagnosis not present

## 2012-05-03 DIAGNOSIS — Z4789 Encounter for other orthopedic aftercare: Secondary | ICD-10-CM | POA: Diagnosis not present

## 2012-05-04 DIAGNOSIS — Z4789 Encounter for other orthopedic aftercare: Secondary | ICD-10-CM | POA: Diagnosis not present

## 2012-05-04 DIAGNOSIS — IMO0001 Reserved for inherently not codable concepts without codable children: Secondary | ICD-10-CM | POA: Diagnosis not present

## 2012-05-04 DIAGNOSIS — F0789 Other personality and behavioral disorders due to known physiological condition: Secondary | ICD-10-CM | POA: Diagnosis not present

## 2012-05-04 DIAGNOSIS — R269 Unspecified abnormalities of gait and mobility: Secondary | ICD-10-CM | POA: Diagnosis not present

## 2012-05-04 DIAGNOSIS — S0990XA Unspecified injury of head, initial encounter: Secondary | ICD-10-CM | POA: Diagnosis not present

## 2012-05-06 DIAGNOSIS — F0789 Other personality and behavioral disorders due to known physiological condition: Secondary | ICD-10-CM | POA: Diagnosis not present

## 2012-05-06 DIAGNOSIS — Z4789 Encounter for other orthopedic aftercare: Secondary | ICD-10-CM | POA: Diagnosis not present

## 2012-05-06 DIAGNOSIS — IMO0001 Reserved for inherently not codable concepts without codable children: Secondary | ICD-10-CM | POA: Diagnosis not present

## 2012-05-06 DIAGNOSIS — R269 Unspecified abnormalities of gait and mobility: Secondary | ICD-10-CM | POA: Diagnosis not present

## 2012-05-06 DIAGNOSIS — S0990XA Unspecified injury of head, initial encounter: Secondary | ICD-10-CM | POA: Diagnosis not present

## 2012-05-07 DIAGNOSIS — R269 Unspecified abnormalities of gait and mobility: Secondary | ICD-10-CM | POA: Diagnosis not present

## 2012-05-07 DIAGNOSIS — IMO0001 Reserved for inherently not codable concepts without codable children: Secondary | ICD-10-CM | POA: Diagnosis not present

## 2012-05-07 DIAGNOSIS — S0990XA Unspecified injury of head, initial encounter: Secondary | ICD-10-CM | POA: Diagnosis not present

## 2012-05-07 DIAGNOSIS — F0789 Other personality and behavioral disorders due to known physiological condition: Secondary | ICD-10-CM | POA: Diagnosis not present

## 2012-05-07 DIAGNOSIS — Z4789 Encounter for other orthopedic aftercare: Secondary | ICD-10-CM | POA: Diagnosis not present

## 2012-05-11 DIAGNOSIS — IMO0002 Reserved for concepts with insufficient information to code with codable children: Secondary | ICD-10-CM | POA: Diagnosis not present

## 2012-05-11 DIAGNOSIS — M1710 Unilateral primary osteoarthritis, unspecified knee: Secondary | ICD-10-CM | POA: Diagnosis not present

## 2012-05-21 DIAGNOSIS — IMO0002 Reserved for concepts with insufficient information to code with codable children: Secondary | ICD-10-CM | POA: Diagnosis not present

## 2012-05-21 DIAGNOSIS — M1710 Unilateral primary osteoarthritis, unspecified knee: Secondary | ICD-10-CM | POA: Diagnosis not present

## 2012-05-21 DIAGNOSIS — Z96659 Presence of unspecified artificial knee joint: Secondary | ICD-10-CM | POA: Diagnosis not present

## 2012-05-26 DIAGNOSIS — M1710 Unilateral primary osteoarthritis, unspecified knee: Secondary | ICD-10-CM | POA: Diagnosis not present

## 2012-05-26 DIAGNOSIS — IMO0002 Reserved for concepts with insufficient information to code with codable children: Secondary | ICD-10-CM | POA: Diagnosis not present

## 2012-05-28 DIAGNOSIS — IMO0002 Reserved for concepts with insufficient information to code with codable children: Secondary | ICD-10-CM | POA: Diagnosis not present

## 2012-05-28 DIAGNOSIS — M1710 Unilateral primary osteoarthritis, unspecified knee: Secondary | ICD-10-CM | POA: Diagnosis not present

## 2012-05-31 DIAGNOSIS — IMO0002 Reserved for concepts with insufficient information to code with codable children: Secondary | ICD-10-CM | POA: Diagnosis not present

## 2012-05-31 DIAGNOSIS — M1710 Unilateral primary osteoarthritis, unspecified knee: Secondary | ICD-10-CM | POA: Diagnosis not present

## 2012-08-27 ENCOUNTER — Ambulatory Visit: Payer: Medicare Other

## 2012-08-27 DIAGNOSIS — Z96659 Presence of unspecified artificial knee joint: Secondary | ICD-10-CM | POA: Diagnosis not present

## 2012-09-01 ENCOUNTER — Encounter (HOSPITAL_COMMUNITY): Payer: Self-pay | Admitting: Emergency Medicine

## 2012-09-01 ENCOUNTER — Emergency Department (HOSPITAL_COMMUNITY)
Admission: EM | Admit: 2012-09-01 | Discharge: 2012-09-01 | Disposition: A | Discharge status: Home / Self Care | Payer: Medicare Other | Attending: Emergency Medicine | Admitting: Emergency Medicine

## 2012-09-01 DIAGNOSIS — Z8709 Personal history of other diseases of the respiratory system: Secondary | ICD-10-CM | POA: Diagnosis not present

## 2012-09-01 DIAGNOSIS — IMO0002 Reserved for concepts with insufficient information to code with codable children: Secondary | ICD-10-CM | POA: Insufficient documentation

## 2012-09-01 DIAGNOSIS — Z87891 Personal history of nicotine dependence: Secondary | ICD-10-CM | POA: Diagnosis not present

## 2012-09-01 DIAGNOSIS — Z79899 Other long term (current) drug therapy: Secondary | ICD-10-CM | POA: Diagnosis not present

## 2012-09-01 DIAGNOSIS — K219 Gastro-esophageal reflux disease without esophagitis: Secondary | ICD-10-CM | POA: Insufficient documentation

## 2012-09-01 DIAGNOSIS — Z8739 Personal history of other diseases of the musculoskeletal system and connective tissue: Secondary | ICD-10-CM | POA: Diagnosis not present

## 2012-09-01 DIAGNOSIS — L02419 Cutaneous abscess of limb, unspecified: Secondary | ICD-10-CM

## 2012-09-01 DIAGNOSIS — Z792 Long term (current) use of antibiotics: Secondary | ICD-10-CM | POA: Insufficient documentation

## 2012-09-01 DIAGNOSIS — Z8669 Personal history of other diseases of the nervous system and sense organs: Secondary | ICD-10-CM | POA: Diagnosis not present

## 2012-09-01 DIAGNOSIS — Z87828 Personal history of other (healed) physical injury and trauma: Secondary | ICD-10-CM | POA: Diagnosis not present

## 2012-09-01 DIAGNOSIS — L0390 Cellulitis, unspecified: Secondary | ICD-10-CM

## 2012-09-01 DIAGNOSIS — Z8701 Personal history of pneumonia (recurrent): Secondary | ICD-10-CM | POA: Insufficient documentation

## 2012-09-01 MED ORDER — ACETAMINOPHEN 325 MG PO TABS: 650.0000 mg | ORAL_TABLET | Freq: Once | ORAL | Status: DC

## 2012-09-01 MED ORDER — SULFAMETHOXAZOLE-TRIMETHOPRIM 800-160 MG PO TABS: 1.0000 | ORAL_TABLET | Freq: Two times a day (BID) | ORAL | Status: DC
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
## 2012-09-01 NOTE — ED Notes (Signed)
PA at bedside.
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
## 2012-09-01 NOTE — ED Notes (Signed)
PT. REPORTS ABSCESS AT LEFT POSTERIOR WRIST WITH NO DRAINAGE CURRENTLY TAKING ORAL KEFLEX ( 1ST DAY ) ANTIBIOTIC .

## 2012-09-04 NOTE — ED Provider Notes (Signed)
Medical screening examination/treatment/procedure(s) were performed by non-physician practitioner and as supervising physician I was immediately available for consultation/collaboration.   Ashby Dawes, MD 09/04/12 [phone]

## 2012-09-08 ENCOUNTER — Other Ambulatory Visit: Payer: Self-pay | Admitting: Family Medicine

## 2012-09-08 DIAGNOSIS — K219 Gastro-esophageal reflux disease without esophagitis: Secondary | ICD-10-CM

## 2012-10-12 ENCOUNTER — Telehealth: Payer: Self-pay | Admitting: Psychology

## 2012-10-12 DIAGNOSIS — Z96659 Presence of unspecified artificial knee joint: Secondary | ICD-10-CM | POA: Diagnosis not present

## 2012-10-12 NOTE — Telephone Encounter (Signed)
Rosalyn left a VM requesting an appointment.  I spoke with Aldrick who stated he thinks he is in need of therapy.  I am not taking new patients right now and directed him to [facility] [phone] and [facility]:  [phone].  Geri Seminole took down the information.  I asked her to call me back if she ran into any difficulty getting an appointment.

## 2012-11-07 ENCOUNTER — Emergency Department (HOSPITAL_COMMUNITY)
Admission: EM | Admit: 2012-11-07 | Discharge: 2012-11-07 | Disposition: A | Discharge status: Home / Self Care | Payer: No Typology Code available for payment source | Attending: Emergency Medicine | Admitting: Emergency Medicine

## 2012-11-07 ENCOUNTER — Encounter (HOSPITAL_COMMUNITY): Payer: Self-pay | Admitting: Emergency Medicine

## 2012-11-07 ENCOUNTER — Emergency Department (INDEPENDENT_AMBULATORY_CARE_PROVIDER_SITE_OTHER)
Admission: EM | Admit: 2012-11-07 | Discharge: 2012-11-07 | Disposition: A | Discharge status: Nursing Home (Includes ICF) | Payer: Medicare Other | Source: Home / Self Care | Attending: Emergency Medicine | Admitting: Emergency Medicine

## 2012-11-07 ENCOUNTER — Emergency Department (HOSPITAL_COMMUNITY): Payer: No Typology Code available for payment source

## 2012-11-07 DIAGNOSIS — Z8701 Personal history of pneumonia (recurrent): Secondary | ICD-10-CM | POA: Insufficient documentation

## 2012-11-07 DIAGNOSIS — Z79899 Other long term (current) drug therapy: Secondary | ICD-10-CM | POA: Insufficient documentation

## 2012-11-07 DIAGNOSIS — Y92410 Unspecified street and highway as the place of occurrence of the external cause: Secondary | ICD-10-CM | POA: Insufficient documentation

## 2012-11-07 DIAGNOSIS — Z87891 Personal history of nicotine dependence: Secondary | ICD-10-CM | POA: Insufficient documentation

## 2012-11-07 DIAGNOSIS — R519 Headache, unspecified: Secondary | ICD-10-CM

## 2012-11-07 DIAGNOSIS — S4980XA Other specified injuries of shoulder and upper arm, unspecified arm, initial encounter: Secondary | ICD-10-CM | POA: Diagnosis not present

## 2012-11-07 DIAGNOSIS — K219 Gastro-esophageal reflux disease without esophagitis: Secondary | ICD-10-CM | POA: Insufficient documentation

## 2012-11-07 DIAGNOSIS — M542 Cervicalgia: Secondary | ICD-10-CM | POA: Diagnosis not present

## 2012-11-07 DIAGNOSIS — S0990XA Unspecified injury of head, initial encounter: Secondary | ICD-10-CM | POA: Insufficient documentation

## 2012-11-07 DIAGNOSIS — S0993XA Unspecified injury of face, initial encounter: Secondary | ICD-10-CM

## 2012-11-07 DIAGNOSIS — Z8739 Personal history of other diseases of the musculoskeletal system and connective tissue: Secondary | ICD-10-CM | POA: Diagnosis not present

## 2012-11-07 DIAGNOSIS — M25519 Pain in unspecified shoulder: Secondary | ICD-10-CM | POA: Diagnosis not present

## 2012-11-07 DIAGNOSIS — R079 Chest pain, unspecified: Secondary | ICD-10-CM | POA: Diagnosis not present

## 2012-11-07 DIAGNOSIS — S46909A Unspecified injury of unspecified muscle, fascia and tendon at shoulder and upper arm level, unspecified arm, initial encounter: Secondary | ICD-10-CM | POA: Diagnosis not present

## 2012-11-07 DIAGNOSIS — S298XXA Other specified injuries of thorax, initial encounter: Secondary | ICD-10-CM | POA: Diagnosis not present

## 2012-11-07 DIAGNOSIS — Y9389 Activity, other specified: Secondary | ICD-10-CM | POA: Diagnosis not present

## 2012-11-07 DIAGNOSIS — S199XXA Unspecified injury of neck, initial encounter: Secondary | ICD-10-CM

## 2012-11-07 MED ORDER — KETOROLAC TROMETHAMINE 60 MG/2ML IM SOLN
60.0000 mg | Freq: Once | INTRAMUSCULAR | Status: AC
  Administered 2012-11-07: 60 mg via INTRAMUSCULAR
  Filled 2012-11-07: qty 2
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
## 2012-11-07 NOTE — ED Notes (Signed)
Notified [facility] 
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
## 2012-11-07 NOTE — ED Notes (Signed)
Pt discharged.Vital signs stable and GCS 15 
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
## 2012-11-08 ENCOUNTER — Encounter: Payer: Self-pay | Admitting: Family Medicine

## 2012-11-08 ENCOUNTER — Ambulatory Visit (INDEPENDENT_AMBULATORY_CARE_PROVIDER_SITE_OTHER): Payer: Medicare Other | Admitting: Family Medicine

## 2012-11-08 VITALS — BP 120/80 | HR 60 | Temp 98.0°F | Ht 67.0 in | Wt 165.3 lb

## 2012-11-08 DIAGNOSIS — R05 Cough: Secondary | ICD-10-CM

## 2012-11-08 DIAGNOSIS — R059 Cough, unspecified: Secondary | ICD-10-CM

## 2012-11-08 DIAGNOSIS — G4733 Obstructive sleep apnea (adult) (pediatric): Secondary | ICD-10-CM

## 2012-11-08 DIAGNOSIS — I10 Essential (primary) hypertension: Secondary | ICD-10-CM

## 2012-11-08 DIAGNOSIS — S0993XA Unspecified injury of face, initial encounter: Secondary | ICD-10-CM | POA: Diagnosis not present

## 2012-11-08 MED ORDER — TOPIRAMATE 200 MG PO TABS: 200.0000 mg | ORAL_TABLET | Freq: Every day | ORAL | Status: DC

## 2012-11-08 MED ORDER — DOXYCYCLINE HYCLATE 100 MG PO TABS: 100.0000 mg | ORAL_TABLET | Freq: Two times a day (BID) | ORAL | Status: DC

## 2012-11-08 NOTE — Assessment & Plan Note (Signed)
No Rx.  No need to treat based on current pressures

## 2012-11-08 NOTE — Assessment & Plan Note (Addendum)
Cough likely from postnasal drip from viral  URI but concern for possible COPD (long h/o smoking that makes symptoms worse) exacerbation as prolonged for >2-3 wks and with productive cough. O2 of 96% today. Worsening symptoms. - Doxy - Mucinex D - Saline nasal spray - OTC allergy meds.  - Recommend PFT

## 2012-11-08 NOTE — Patient Instructions (Addendum)
You likely have a respiratory illness and allergies making your breathing difficult Please start taking the Doxycycline for the cough. Please also start taking Mucinex D and an allergy medicine such as allegra or claritin Please also start using saline nasal spray to clear up your nose.  Please try stop smoking. Go to [email or web address] for help Please follow up with your primary doctor sometime in the next few weeks. Please also consider getting pulmonary function tests when you see your doctor next

## 2012-11-08 NOTE — Assessment & Plan Note (Deleted)
Possible COPD exacerbation  Will treat w/ 10 day course of Doxy
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
## 2012-11-09 DIAGNOSIS — M25519 Pain in unspecified shoulder: Secondary | ICD-10-CM | POA: Diagnosis not present

## 2012-11-09 DIAGNOSIS — M542 Cervicalgia: Secondary | ICD-10-CM | POA: Diagnosis not present

## 2012-11-15 ENCOUNTER — Ambulatory Visit: Payer: Medicare Other | Admitting: Family Medicine

## 2012-11-16 DIAGNOSIS — M542 Cervicalgia: Secondary | ICD-10-CM | POA: Diagnosis not present

## 2012-11-19 DIAGNOSIS — M542 Cervicalgia: Secondary | ICD-10-CM | POA: Diagnosis not present

## 2012-11-26 DIAGNOSIS — M542 Cervicalgia: Secondary | ICD-10-CM | POA: Diagnosis not present

## 2012-12-02 ENCOUNTER — Ambulatory Visit: Payer: Medicare Other

## 2012-12-03 ENCOUNTER — Other Ambulatory Visit: Payer: Self-pay | Admitting: Family Medicine

## 2012-12-05 ENCOUNTER — Other Ambulatory Visit: Payer: Self-pay | Admitting: Family Medicine

## 2013-01-03 ENCOUNTER — Ambulatory Visit (INDEPENDENT_AMBULATORY_CARE_PROVIDER_SITE_OTHER): Payer: Medicare Other | Admitting: Family Medicine

## 2013-01-03 VITALS — BP 116/79 | HR 82 | Temp 98.1°F | Wt 161.0 lb

## 2013-01-03 DIAGNOSIS — R634 Abnormal weight loss: Secondary | ICD-10-CM

## 2013-01-03 DIAGNOSIS — I10 Essential (primary) hypertension: Secondary | ICD-10-CM | POA: Diagnosis not present

## 2013-01-03 DIAGNOSIS — G4733 Obstructive sleep apnea (adult) (pediatric): Secondary | ICD-10-CM | POA: Diagnosis not present

## 2013-01-03 DIAGNOSIS — R059 Cough, unspecified: Secondary | ICD-10-CM | POA: Diagnosis not present

## 2013-01-03 DIAGNOSIS — S0993XA Unspecified injury of face, initial encounter: Secondary | ICD-10-CM | POA: Diagnosis not present

## 2013-01-03 LAB — CBC WITH DIFFERENTIAL/PLATELET
Eosinophils Absolute: 0.4 10*3/uL (ref 0.0–0.7)
Eosinophils Relative: 4 % (ref 0–5)
HCT: 42.9 % (ref 39.0–52.0)
Lymphocytes Relative: 40 % (ref 12–46)
Lymphs Abs: 3.9 10*3/uL (ref 0.7–4.0)
MCH: 30.7 pg (ref 26.0–34.0)
MCV: 90.3 fL (ref 78.0–100.0)
Monocytes Absolute: 0.9 10*3/uL (ref 0.1–1.0)
Monocytes Relative: 9 % (ref 3–12)
Platelets: 304 10*3/uL (ref 150–400)
RBC: 4.75 MIL/uL (ref 4.22–5.81)
WBC: 9.7 10*3/uL (ref 4.0–10.5)

## 2013-01-03 MED ORDER — MEGESTROL ACETATE 625 MG/5ML PO SUSP: 625.0000 mg | Freq: Every day | ORAL | Status: DC
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
## 2013-01-03 NOTE — Patient Instructions (Signed)
It was great to see you today!  Seek help right away if you are not able to tolerate fluids or f you are not making as much urine as usual (at least 4-5 times daily)

## 2013-01-04 LAB — BASIC METABOLIC PANEL
BUN: 12 mg/dL (ref 6–23)
CO2: 29 mEq/L (ref 19–32)
Calcium: 9.5 mg/dL (ref 8.4–10.5)
Chloride: 104 mEq/L (ref 96–112)
Creat: 1.35 mg/dL (ref 0.50–1.35)
Glucose, Bld: 87 mg/dL (ref 70–99)

## 2013-01-10 ENCOUNTER — Other Ambulatory Visit: Payer: Self-pay | Admitting: Family Medicine

## 2013-01-10 MED ORDER — TOPIRAMATE 200 MG PO TABS: ORAL_TABLET | ORAL | Status: DC

## 2013-01-10 MED ORDER — OLANZAPINE 2.5 MG PO TABS: 2.5000 mg | ORAL_TABLET | Freq: Every day | ORAL | Status: DC

## 2013-01-10 MED ORDER — TRAZODONE HCL 50 MG PO TABS: 50.0000 mg | ORAL_TABLET | Freq: Every day | ORAL | Status: DC

## 2013-01-20 ENCOUNTER — Other Ambulatory Visit: Payer: Self-pay | Admitting: Family Medicine

## 2013-02-04 ENCOUNTER — Ambulatory Visit (INDEPENDENT_AMBULATORY_CARE_PROVIDER_SITE_OTHER): Payer: Medicare Other | Admitting: Internal Medicine

## 2013-02-04 VITALS — BP 110/70 | HR 67 | Temp 97.9°F | Resp 16 | Ht 68.0 in | Wt 168.0 lb

## 2013-02-04 DIAGNOSIS — S61209A Unspecified open wound of unspecified finger without damage to nail, initial encounter: Secondary | ICD-10-CM | POA: Diagnosis not present

## 2013-02-04 MED ORDER — DOXYCYCLINE HYCLATE 100 MG PO TABS: 100.0000 mg | ORAL_TABLET | Freq: Two times a day (BID) | ORAL | Status: DC

## 2013-02-04 NOTE — Progress Notes (Addendum)
Subjective:    Patient ID: Gregory [name], male    DOB: [birth year], 52 y.o.   MRN: [id number] This chart was scribed for Ellamae Sia, MD by Clydene Laming, ED Scribe. This patient was seen in room 6 and the patient's care was started at 12:56 PM. HPI HPI Comments: Gregory [name] is a 52 y.o. male who presents to the [facility] complaining of right hand first finger 1.5cm laceration that occurred 5 days ago. Pt was working on a car when an instrument split down his finger. Pain has increased over the last 24 hours. Bleeding was intermittent this week. Wife says he has been squeezing his finger and expressing pus. No fever.   Tetanus up-to-date   Patient Active Problem List   Diagnosis Date Noted  . Otitis externa 04/05/2012  . Fatigue 08/22/2011  . Nocturia 08/22/2011  . OA (osteoarthritis) of knee 07/16/2011  . Gout 06/13/2011  . Rash and nonspecific skin eruption 04/23/2011  . Preoperative clearance 03/12/2011  . Ear itching 12/04/2010  . Blister of toe of left foot 10/19/2010  . Lateral epicondylitis of left elbow 08/13/2010  . Weight loss, non-intentional 08/13/2010  . Vitamin D deficiency 05/16/2010  . Anemia 05/13/2010  . Dry skin 05/13/2010  . Urinary hesitancy 05/13/2010  . Routine general medical examination at a health care facility 05/13/2010  . Cough 05/06/2010  . HERNIATED LUMBOSACRAL DISC 06/18/2009  . LUMBAGO 06/18/2009  . HYPERTENSION, BENIGN ESSENTIAL 02/27/2009  . WEIGHT LOSS, ABNORMAL 02/27/2009  . PSORIASIS 01/03/2009  . KNEE PAIN, LEFT, CHRONIC 06/28/2008  . TOBACCO USE, QUIT 05/30/2008  . UNEQUAL LEG LENGTH 05/25/2007  . OBSTRUCTIVE SLEEP APNEA 12/11/2006  . MIGRAINE NOS W/O INTRACTABLE MIGRAINE 10/09/2006  . ALLERGIC RHINITIS 06/15/2006  . BIPOLAR DISORDER UNSPECIFIED 04/16/2006  . GASTROESOPHAGEAL REFLUX, NO ESOPHAGITIS 04/16/2006  . DJD, UNSPECIFIED 04/16/2006  . INSOMNIA NOS 04/16/2006    Past Surgical History  Procedure  Laterality Date  . Cervical fusion  2005    some neck pain  . Orif left leg Left 2003  . Carpal tunnel release  yrs ago    bilateral  . Hardware removal Left 03/17/2011    Procedure: HARDWARE REMOVAL;  Surgeon: Loanne Drilling, MD;  Location: [facility];  Service: Orthopedics;  Laterality: Left;  Hardware Removal Left Knee  . Total knee arthroplasty  07/16/2011    Procedure: TOTAL KNEE ARTHROPLASTY;  Surgeon: Loanne Drilling, MD;  Location: [facility];  Service: Orthopedics;  Laterality: Left;  . Knee arthrotomy Left 04/08/2012    Procedure: LEFT KNEE ARTHROTOMY WITH SCAR EXCISION;  Surgeon: Loanne Drilling, MD;  Location: [facility];  Service: Orthopedics;  Laterality: Left;  with Scar Excision    Allergies  Allergen Reactions  . Peanut-Containing Drug Products Anaphylaxis  . Demerol [Meperidine] Rash  . Hydromorphone Hcl Itching and Rash  . Meperidine Hcl Itching and Rash    delusional  . Morphine Itching and Rash   Prior to Admission medications   Medication Sig Start Date End Date Taking? Authorizing Provider  DEXILANT 60 MG capsule TAKE ONE CAPSULE BY MOUTH 30-60 MINUES BEFORE FIRST MEAL OF THE DAY 01/20/13  Yes Bryan R Hess, DO  FLUoxetine (PROZAC) 20 MG capsule Take 20 mg by mouth at bedtime.   Yes Historical Provider, MD  FLUoxetine (PROZAC) 20 MG capsule TAKE 1 CAPSULE (20 MG TOTAL) BY MOUTH AT BEDTIME. ONE AT BEDTIME 12/03/12  Yes Bryan R Hess, DO  megestrol (MEGACE ES)  625 MG/5ML suspension Take 5 mLs (625 mg total) by mouth daily. 01/03/13  Yes Elenora Gamma, MD  OLANZapine (ZYPREXA) 2.5 MG tablet Take 1 tablet (2.5 mg total) by mouth at bedtime. 01/10/13  Yes Bryan R Hess, DO  topiramate (TOPAMAX) 200 MG tablet TAKE 1 TABLET EVERY DAY FOR MIGRAINES 01/10/13  Yes Bryan R Hess, DO  traZODone (DESYREL) 50 MG tablet Take 1 tablet (50 mg total) by mouth at bedtime. 01/10/13  Yes Bryan R Hess, DO  doxycycline (VIBRA-TABS) 100 MG tablet Take 1 tablet (100 mg total) by mouth 2 (two) times  daily. Do not lie down within 1 hour of taking meds 02/04/13   Tonye Pearson, MD  EPINEPHrine (EPI-PEN) 0.3 mg/0.3 mL DEVI Inject 0.3 mg into the muscle once. Take if you have an allergic reaction, call MD or go to ED if you have to use pen 12/04/10   Ivy de [employer]sec[employer], DO  oxyCODONE (OXY IR/ROXICODONE) 5 MG immediate release tablet Take 5 mg by mouth every 6 (six) hours as needed for pain.    Historical Provider, MD  pregabalin (LYRICA) 75 MG capsule Take 75 mg by mouth at bedtime.    Historical Provider, MD        Review of Systems  Constitutional: Negative for fever.  Skin: Positive for wound.       Objective:   Physical Exam  Nursing note and vitals reviewed. Constitutional: He is oriented to person, place, and time. He appears well-developed and well-nourished. No distress.  HENT:  Head: Normocephalic and atraumatic.  Cardiovascular: Normal rate.   Pulmonary/Chest: Effort normal.  Neurological: He is alert and oriented to person, place, and time.  Skin:  1.5 cm right hand little finger laceration on oad. Redness around the wound but no streaking. No pus present currently Sensation intact No joint involvement   Filed Vitals:   02/04/13 1228  BP: 110/70  Pulse: 67  Temp: 97.9 F (36.6 C)  TempSrc: Oral  Resp: 16  Height: 5\' 8"  (1.727 m)  Weight: 168 lb (76.204 kg)  SpO2: 97%       Assessment & Plan:   Problem #1 wound finger with secondary infection   Hot soaks 3 times a day 20 minutes with soap and water Meds ordered this encounter  Medications  . doxycycline (VIBRA-TABS) 100 MG tablet    Sig: Take 1 tablet (100 mg total) by mouth 2 (two) times daily. Do not lie down within 1 hour of taking meds    Dispense:  20 tablet    Refill:  0   1:03 PM- Discussed treatment plan with pt at bedside. Pt verbalized understanding and agreement with plan.  I personally performed the services described in this documentation, which was scribed in my presence. The  recorded information has been reviewed and is accurate.

## 2013-02-08 ENCOUNTER — Other Ambulatory Visit: Payer: Self-pay | Admitting: Family Medicine

## 2013-02-08 ENCOUNTER — Ambulatory Visit (INDEPENDENT_AMBULATORY_CARE_PROVIDER_SITE_OTHER): Payer: Medicare Other | Admitting: Family Medicine

## 2013-02-08 ENCOUNTER — Telehealth: Payer: Self-pay | Admitting: Family Medicine

## 2013-02-08 ENCOUNTER — Encounter: Payer: Self-pay | Admitting: Family Medicine

## 2013-02-08 ENCOUNTER — Ambulatory Visit
Admission: RE | Admit: 2013-02-08 | Discharge: 2013-02-08 | Disposition: A | Discharge status: Home / Self Care | Payer: Medicare Other | Source: Ambulatory Visit | Attending: Family Medicine | Admitting: Family Medicine

## 2013-02-08 VITALS — BP 123/90 | HR 72 | Temp 98.1°F | Ht 68.0 in | Wt 166.0 lb

## 2013-02-08 DIAGNOSIS — S6710XA Crushing injury of unspecified finger(s), initial encounter: Secondary | ICD-10-CM | POA: Insufficient documentation

## 2013-02-08 DIAGNOSIS — I10 Essential (primary) hypertension: Secondary | ICD-10-CM | POA: Diagnosis not present

## 2013-02-08 DIAGNOSIS — G4733 Obstructive sleep apnea (adult) (pediatric): Secondary | ICD-10-CM | POA: Diagnosis not present

## 2013-02-08 DIAGNOSIS — R634 Abnormal weight loss: Secondary | ICD-10-CM | POA: Diagnosis not present

## 2013-02-08 DIAGNOSIS — S0993XA Unspecified injury of face, initial encounter: Secondary | ICD-10-CM | POA: Diagnosis not present

## 2013-02-08 DIAGNOSIS — M7989 Other specified soft tissue disorders: Secondary | ICD-10-CM | POA: Diagnosis not present

## 2013-02-08 DIAGNOSIS — R059 Cough, unspecified: Secondary | ICD-10-CM | POA: Diagnosis not present

## 2013-02-08 NOTE — Patient Instructions (Signed)
[name], it was nice seeing you today.  Please try using ice baths to your hand at least two times per day.  As well, please get your x-ray today and continue with the finger splint continually.  In about 1-2 weeks, you can start to move the finger a little bit more and continue wearing the splint until I see you back in about 3-4 weeks.  Thanks, Dr. Paulina Fusi
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
## 2013-02-08 NOTE — Telephone Encounter (Signed)
Called pt to let him know that his x-rays does not show a fx and he does not need to continue to use the finger splint.   Twana First Paulina Fusi, DO of [facility] 02/08/2013, 1:06 PM

## 2013-02-08 NOTE — Assessment & Plan Note (Addendum)
Pt initial injury about 8 days ago now when he dropped a pull bar into the distal phalanx of his left 5th digit.  Had slight laceration on that area, was seen at [facility] that day, started on Doxy, no x-ray taken, and since then has had increased pain and occasional paresthesias of the 5th digit.  Denies any streaking, fever, chills, sweats, but does have some limited ROM secondary to edema.  Has been compliant with finger splint to the area without further injury.  Will get 3 view of the finger to evaluate for intraarticular fx vs transverse angulated fx, both of which would require hand surgeon referral.  However, I have a feeling this is either contusion vs Tuft's vs crush fx.  No purulent drainage, streaking, systemic Sx which would be concerning for osteo. He does have two point discrimination, + 2 sec cap refill, and able to flex/extend his finger at the DIP/PIP jt, and normal appearing nail bed/nail plate.  Will continue with DIP immobilization x 3 more weeks, f/u at that time, intermittent DIP/PIP flexion starting in 1-2 weeks, tylenol for pain, and ice baths for edema.  F/U in 4 weeks to see how he is doing, precepted with Dr. Randolm Idol.  >50% of the 25 minute visit spent counseling, managing, and explaining options to the patient.

## 2013-02-21 ENCOUNTER — Other Ambulatory Visit: Payer: Self-pay | Admitting: Family Medicine

## 2013-02-21 DIAGNOSIS — K219 Gastro-esophageal reflux disease without esophagitis: Secondary | ICD-10-CM

## 2013-03-03 ENCOUNTER — Other Ambulatory Visit: Payer: Self-pay | Admitting: Family Medicine

## 2013-03-03 MED ORDER — DEXLANSOPRAZOLE 60 MG PO CPDR: 60.0000 mg | DELAYED_RELEASE_CAPSULE | Freq: Every day | ORAL | Status: DC

## 2013-03-04 ENCOUNTER — Telehealth: Payer: Self-pay | Admitting: *Deleted

## 2013-03-04 NOTE — Telephone Encounter (Signed)
Left message on patients voicemail letting him know RX was called in for 90days. Fusako Tanabe, Lewie Loron

## 2013-03-28 ENCOUNTER — Telehealth: Payer: Self-pay | Admitting: *Deleted

## 2013-03-28 MED ORDER — DEXLANSOPRAZOLE 60 MG PO CPDR: 60.0000 mg | DELAYED_RELEASE_CAPSULE | Freq: Every day | ORAL | Status: DC

## 2013-03-28 NOTE — Telephone Encounter (Addendum)
Pt request 90 day script for Dexilant DR 60 mg Cap.  Rx request placed in PCP box for review.  Derl Barrow, RN  This was originally sent in on 03/03/13 as 90 day script to [facility].  I have re-sent this in, please let pt know.  Thanks, Tamela Oddi. Awanda Mink, DO of [facility] 03/28/2013, 1:39 PM

## 2013-06-24 ENCOUNTER — Other Ambulatory Visit: Payer: Self-pay | Admitting: *Deleted

## 2013-06-24 ENCOUNTER — Telehealth: Payer: Self-pay | Admitting: Family Medicine

## 2013-06-24 NOTE — Telephone Encounter (Addendum)
Pt needs a refill on his Dexilant. This MUST be 90 day supply because it is the same price as the 30 day supply and his insurance has to have 90 day for him to be able to get this. jw  Sent in.  Thanks, Tamela Oddi. Hess, DO of [facility] 06/27/2013, 11:12 AM

## 2013-06-26 MED ORDER — DEXLANSOPRAZOLE 60 MG PO CPDR: 60.0000 mg | DELAYED_RELEASE_CAPSULE | Freq: Every day | ORAL | Status: DC

## 2013-06-27 MED ORDER — DEXLANSOPRAZOLE 60 MG PO CPDR: 60.0000 mg | DELAYED_RELEASE_CAPSULE | Freq: Every day | ORAL | Status: DC

## 2013-06-27 NOTE — Telephone Encounter (Signed)
  patient informed.H[place]man

## 2013-08-29 ENCOUNTER — Ambulatory Visit (INDEPENDENT_AMBULATORY_CARE_PROVIDER_SITE_OTHER): Payer: Medicare Other | Admitting: *Deleted

## 2013-08-29 DIAGNOSIS — Z23 Encounter for immunization: Secondary | ICD-10-CM | POA: Diagnosis not present

## 2013-08-29 DIAGNOSIS — I10 Essential (primary) hypertension: Secondary | ICD-10-CM | POA: Diagnosis not present

## 2013-08-29 DIAGNOSIS — Z0184 Encounter for antibody response examination: Secondary | ICD-10-CM | POA: Diagnosis not present

## 2013-08-29 DIAGNOSIS — R05 Cough: Secondary | ICD-10-CM | POA: Diagnosis not present

## 2013-08-29 DIAGNOSIS — G4733 Obstructive sleep apnea (adult) (pediatric): Secondary | ICD-10-CM | POA: Diagnosis not present

## 2013-08-29 DIAGNOSIS — S6710XA Crushing injury of unspecified finger(s), initial encounter: Secondary | ICD-10-CM | POA: Diagnosis not present

## 2013-08-29 DIAGNOSIS — S0993XA Unspecified injury of face, initial encounter: Secondary | ICD-10-CM | POA: Diagnosis not present

## 2013-08-29 DIAGNOSIS — R059 Cough, unspecified: Secondary | ICD-10-CM | POA: Diagnosis not present

## 2013-08-29 DIAGNOSIS — R634 Abnormal weight loss: Secondary | ICD-10-CM | POA: Diagnosis not present

## 2013-08-30 LAB — VARICELLA ZOSTER ANTIBODY, IGG: Varicella IgG: 466.8 Index — ABNORMAL HIGH (ref <135.00)

## 2013-08-30 LAB — MEASLES/MUMPS/RUBELLA IMMUNITY
Mumps IgG: 199 AU/mL — ABNORMAL HIGH (ref <9.00)
Rubella: 11.5 Index — ABNORMAL HIGH (ref <0.90)

## 2013-09-26 ENCOUNTER — Ambulatory Visit (INDEPENDENT_AMBULATORY_CARE_PROVIDER_SITE_OTHER): Payer: Medicare Other | Admitting: *Deleted

## 2013-09-26 DIAGNOSIS — S0993XA Unspecified injury of face, initial encounter: Secondary | ICD-10-CM | POA: Diagnosis not present

## 2013-09-26 DIAGNOSIS — R059 Cough, unspecified: Secondary | ICD-10-CM | POA: Diagnosis not present

## 2013-09-26 DIAGNOSIS — G4733 Obstructive sleep apnea (adult) (pediatric): Secondary | ICD-10-CM | POA: Diagnosis not present

## 2013-09-26 DIAGNOSIS — Z23 Encounter for immunization: Secondary | ICD-10-CM | POA: Diagnosis not present

## 2013-09-26 DIAGNOSIS — I10 Essential (primary) hypertension: Secondary | ICD-10-CM | POA: Diagnosis not present

## 2013-09-26 DIAGNOSIS — S6710XA Crushing injury of unspecified finger(s), initial encounter: Secondary | ICD-10-CM | POA: Diagnosis not present

## 2013-09-26 DIAGNOSIS — R634 Abnormal weight loss: Secondary | ICD-10-CM | POA: Diagnosis not present

## 2013-09-28 ENCOUNTER — Ambulatory Visit (INDEPENDENT_AMBULATORY_CARE_PROVIDER_SITE_OTHER): Payer: Medicare Other | Admitting: Family Medicine

## 2013-09-28 ENCOUNTER — Encounter: Payer: Self-pay | Admitting: Family Medicine

## 2013-09-28 VITALS — BP 119/74 | HR 61 | Temp 98.0°F | Resp 14 | Ht 68.0 in | Wt 164.0 lb

## 2013-09-28 DIAGNOSIS — R739 Hyperglycemia, unspecified: Secondary | ICD-10-CM

## 2013-09-28 DIAGNOSIS — N182 Chronic kidney disease, stage 2 (mild): Secondary | ICD-10-CM

## 2013-09-28 DIAGNOSIS — R7309 Other abnormal glucose: Secondary | ICD-10-CM

## 2013-09-28 DIAGNOSIS — R059 Cough, unspecified: Secondary | ICD-10-CM | POA: Diagnosis not present

## 2013-09-28 DIAGNOSIS — M2170 Unequal limb length (acquired), unspecified site: Secondary | ICD-10-CM | POA: Diagnosis not present

## 2013-09-28 DIAGNOSIS — Z23 Encounter for immunization: Secondary | ICD-10-CM | POA: Diagnosis not present

## 2013-09-28 DIAGNOSIS — E7800 Pure hypercholesterolemia, unspecified: Secondary | ICD-10-CM

## 2013-09-28 DIAGNOSIS — Z5320 Procedure and treatment not carried out because of patient's decision for unspecified reasons: Secondary | ICD-10-CM

## 2013-09-28 DIAGNOSIS — S6710XA Crushing injury of unspecified finger(s), initial encounter: Secondary | ICD-10-CM | POA: Diagnosis not present

## 2013-09-28 DIAGNOSIS — Z13220 Encounter for screening for lipoid disorders: Secondary | ICD-10-CM | POA: Diagnosis not present

## 2013-09-28 DIAGNOSIS — G4733 Obstructive sleep apnea (adult) (pediatric): Secondary | ICD-10-CM | POA: Diagnosis not present

## 2013-09-28 DIAGNOSIS — I10 Essential (primary) hypertension: Secondary | ICD-10-CM | POA: Diagnosis not present

## 2013-09-28 DIAGNOSIS — R634 Abnormal weight loss: Secondary | ICD-10-CM | POA: Diagnosis not present

## 2013-09-28 DIAGNOSIS — S199XXA Unspecified injury of neck, initial encounter: Secondary | ICD-10-CM | POA: Diagnosis not present

## 2013-09-28 DIAGNOSIS — S0993XA Unspecified injury of face, initial encounter: Secondary | ICD-10-CM | POA: Diagnosis not present

## 2013-09-28 LAB — LIPID PANEL
CHOL/HDL RATIO: 2.6 ratio
CHOLESTEROL: 140 mg/dL (ref 0–200)
HDL: 53 mg/dL (ref >39)
LDL Cholesterol: 75 mg/dL (ref 0–99)
Triglycerides: 58 mg/dL (ref <150)
VLDL: 12 mg/dL (ref 0–40)

## 2013-09-28 LAB — COMPREHENSIVE METABOLIC PANEL
ALK PHOS: 81 U/L (ref 39–117)
ALT: 9 U/L (ref 0–53)
AST: 13 U/L (ref 0–37)
Albumin: 4 g/dL (ref 3.5–5.2)
BUN: 14 mg/dL (ref 6–23)
CALCIUM: 9.2 mg/dL (ref 8.4–10.5)
CO2: 25 meq/L (ref 19–32)
Chloride: 104 mEq/L (ref 96–112)
Creat: 1.21 mg/dL (ref 0.50–1.35)
Glucose, Bld: 102 mg/dL — ABNORMAL HIGH (ref 70–99)
Potassium: 4 mEq/L (ref 3.5–5.3)
Sodium: 137 mEq/L (ref 135–145)
Total Bilirubin: 0.3 mg/dL (ref 0.2–1.2)
Total Protein: 6.5 g/dL (ref 6.0–8.3)

## 2013-09-28 LAB — CBC
HCT: 39.6 % (ref 39.0–52.0)
HEMOGLOBIN: 13.7 g/dL (ref 13.0–17.0)
MCH: 30 pg (ref 26.0–34.0)
MCHC: 34.6 g/dL (ref 30.0–36.0)
MCV: 86.8 fL (ref 78.0–100.0)
PLATELETS: 279 10*3/uL (ref 150–400)
RBC: 4.56 MIL/uL (ref 4.22–5.81)
RDW: 14.1 % (ref 11.5–15.5)
WBC: 8.1 10*3/uL (ref 4.0–10.5)

## 2013-09-28 LAB — POCT GLYCOSYLATED HEMOGLOBIN (HGB A1C): Hemoglobin A1C: 5.6

## 2013-09-28 MED ORDER — GABAPENTIN 300 MG PO CAPS: 300.0000 mg | ORAL_CAPSULE | Freq: Every day | ORAL | Status: DC

## 2013-09-28 NOTE — Assessment & Plan Note (Signed)
Most likely the cause of his chronic knee (along with previous TKA), hip, and back pain.  Will be seeing Dr. Oneida Alar later this month for leg lift insert and will defer treatment until then.  As well, pt with most likely degenerative joint/disc disease in his lower back as his forward flexion is quite limited but denies any weakness or radiculopathy.  Last lumbar x-rays >3 yrs ago, will repeat today with A/P, lateral, oblique.  F/U in 2-3 months.

## 2013-09-28 NOTE — Addendum Note (Signed)
Addended by: [place], Janos Shampine on: 09/28/2013 02:21 PM   Modules accepted: Orders
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
## 2013-09-28 NOTE — Patient Instructions (Addendum)
Please see Dr. Oneida Alar when scheduled.  Please get your back x-rays.  Please see [place] back in about 3 months.  Please consider your colonoscopy  Thanks, Dr. Awanda Mink

## 2013-09-28 NOTE — Assessment & Plan Note (Signed)
Information given for colonoscopy, explained benefits and risks, pt will think about it and paper given to him to call for appointment.

## 2013-09-29 DIAGNOSIS — H40039 Anatomical narrow angle, unspecified eye: Secondary | ICD-10-CM | POA: Diagnosis not present

## 2013-09-29 DIAGNOSIS — H2510 Age-related nuclear cataract, unspecified eye: Secondary | ICD-10-CM | POA: Diagnosis not present

## 2013-10-04 ENCOUNTER — Ambulatory Visit (INDEPENDENT_AMBULATORY_CARE_PROVIDER_SITE_OTHER): Payer: Medicare Other | Admitting: Sports Medicine

## 2013-10-04 ENCOUNTER — Encounter: Payer: Self-pay | Admitting: Sports Medicine

## 2013-10-04 VITALS — BP 126/75 | Ht 67.0 in | Wt 167.0 lb

## 2013-10-04 DIAGNOSIS — Z23 Encounter for immunization: Secondary | ICD-10-CM | POA: Diagnosis not present

## 2013-10-04 DIAGNOSIS — Z5189 Encounter for other specified aftercare: Secondary | ICD-10-CM

## 2013-10-04 DIAGNOSIS — IMO0002 Reserved for concepts with insufficient information to code with codable children: Secondary | ICD-10-CM

## 2013-10-04 DIAGNOSIS — S76311A Strain of muscle, fascia and tendon of the posterior muscle group at thigh level, right thigh, initial encounter: Secondary | ICD-10-CM | POA: Insufficient documentation

## 2013-10-04 DIAGNOSIS — G4733 Obstructive sleep apnea (adult) (pediatric): Secondary | ICD-10-CM | POA: Diagnosis not present

## 2013-10-04 DIAGNOSIS — S6710XA Crushing injury of unspecified finger(s), initial encounter: Secondary | ICD-10-CM | POA: Diagnosis not present

## 2013-10-04 DIAGNOSIS — M2170 Unequal limb length (acquired), unspecified site: Secondary | ICD-10-CM

## 2013-10-04 DIAGNOSIS — E7800 Pure hypercholesterolemia, unspecified: Secondary | ICD-10-CM | POA: Diagnosis not present

## 2013-10-04 DIAGNOSIS — N182 Chronic kidney disease, stage 2 (mild): Secondary | ICD-10-CM | POA: Diagnosis not present

## 2013-10-04 DIAGNOSIS — R7309 Other abnormal glucose: Secondary | ICD-10-CM | POA: Diagnosis not present

## 2013-10-04 DIAGNOSIS — Z5320 Procedure and treatment not carried out because of patient's decision for unspecified reasons: Secondary | ICD-10-CM | POA: Diagnosis not present

## 2013-10-04 DIAGNOSIS — Z13220 Encounter for screening for lipoid disorders: Secondary | ICD-10-CM | POA: Diagnosis not present

## 2013-10-04 DIAGNOSIS — I10 Essential (primary) hypertension: Secondary | ICD-10-CM | POA: Diagnosis not present

## 2013-10-04 DIAGNOSIS — S0993XA Unspecified injury of face, initial encounter: Secondary | ICD-10-CM | POA: Diagnosis not present

## 2013-10-04 DIAGNOSIS — S199XXA Unspecified injury of neck, initial encounter: Secondary | ICD-10-CM | POA: Diagnosis not present

## 2013-10-04 DIAGNOSIS — R059 Cough, unspecified: Secondary | ICD-10-CM | POA: Diagnosis not present

## 2013-10-04 DIAGNOSIS — S76311D Strain of muscle, fascia and tendon of the posterior muscle group at thigh level, right thigh, subsequent encounter: Secondary | ICD-10-CM

## 2013-10-04 DIAGNOSIS — R634 Abnormal weight loss: Secondary | ICD-10-CM | POA: Diagnosis not present

## 2013-10-04 MED ORDER — LIDOCAINE 5 % EX PTCH: 1.0000 | MEDICATED_PATCH | CUTANEOUS | Status: DC

## 2013-10-04 MED ORDER — CYCLOBENZAPRINE HCL 5 MG PO TABS: 5.0000 mg | ORAL_TABLET | Freq: Three times a day (TID) | ORAL | Status: DC | PRN

## 2013-10-04 NOTE — Assessment & Plan Note (Signed)
-  2x 3/4 inch heel lifts were applied to the inferior surface of a new sport insole. An additional 3/4 inch heel lift was applied to the superior surface of the new insole. -The new insoles were inserted in the patient shoes and he ambulated with much improvement in pain and alignment. His iliac crest heights were equal. He reported improvement of pain. -We discussed other options including custom orthotics as well as specialized shoes to accommodate his heel lifts. He declined further customization at this time, and wishes to proceed with intermittent rechecks and for new sport insoles if needed. -He will followup on an as-needed basis.

## 2013-10-04 NOTE — Assessment & Plan Note (Addendum)
Chronic right hamstring tightness status post injury in 2008, aggravated by leg length discrepancy and weak hip abductors on the right. -We altered the biomechanics by providing him a new left support insole with heel lifts (see leg length discrepancy problem plan). -He was given instructions regarding hamstring exercises and stretches. -I sent in Flexeril 5 mg by mouth 3 times a day when necessary for muscle spasms. -We will see him back if needed, if his hamstring tightness and irritation persists.
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
## 2013-10-18 ENCOUNTER — Ambulatory Visit (INDEPENDENT_AMBULATORY_CARE_PROVIDER_SITE_OTHER): Payer: Medicare Other | Admitting: Sports Medicine

## 2013-10-18 ENCOUNTER — Encounter: Payer: Self-pay | Admitting: Sports Medicine

## 2013-10-18 VITALS — BP 118/84 | Ht 67.0 in | Wt 167.0 lb

## 2013-10-18 DIAGNOSIS — G4733 Obstructive sleep apnea (adult) (pediatric): Secondary | ICD-10-CM | POA: Diagnosis not present

## 2013-10-18 DIAGNOSIS — Z5320 Procedure and treatment not carried out because of patient's decision for unspecified reasons: Secondary | ICD-10-CM | POA: Diagnosis not present

## 2013-10-18 DIAGNOSIS — S0993XA Unspecified injury of face, initial encounter: Secondary | ICD-10-CM | POA: Diagnosis not present

## 2013-10-18 DIAGNOSIS — Z13220 Encounter for screening for lipoid disorders: Secondary | ICD-10-CM | POA: Diagnosis not present

## 2013-10-18 DIAGNOSIS — S76311D Strain of muscle, fascia and tendon of the posterior muscle group at thigh level, right thigh, subsequent encounter: Secondary | ICD-10-CM

## 2013-10-18 DIAGNOSIS — S6710XA Crushing injury of unspecified finger(s), initial encounter: Secondary | ICD-10-CM | POA: Diagnosis not present

## 2013-10-18 DIAGNOSIS — R059 Cough, unspecified: Secondary | ICD-10-CM | POA: Diagnosis not present

## 2013-10-18 DIAGNOSIS — R7309 Other abnormal glucose: Secondary | ICD-10-CM | POA: Diagnosis not present

## 2013-10-18 DIAGNOSIS — N182 Chronic kidney disease, stage 2 (mild): Secondary | ICD-10-CM | POA: Diagnosis not present

## 2013-10-18 DIAGNOSIS — Z5189 Encounter for other specified aftercare: Secondary | ICD-10-CM

## 2013-10-18 DIAGNOSIS — Z23 Encounter for immunization: Secondary | ICD-10-CM | POA: Diagnosis not present

## 2013-10-18 DIAGNOSIS — R634 Abnormal weight loss: Secondary | ICD-10-CM | POA: Diagnosis not present

## 2013-10-18 DIAGNOSIS — M2170 Unequal limb length (acquired), unspecified site: Secondary | ICD-10-CM | POA: Diagnosis not present

## 2013-10-18 DIAGNOSIS — S199XXA Unspecified injury of neck, initial encounter: Secondary | ICD-10-CM | POA: Diagnosis not present

## 2013-10-18 DIAGNOSIS — E7800 Pure hypercholesterolemia, unspecified: Secondary | ICD-10-CM | POA: Diagnosis not present

## 2013-10-18 DIAGNOSIS — IMO0002 Reserved for concepts with insufficient information to code with codable children: Secondary | ICD-10-CM

## 2013-10-18 DIAGNOSIS — I10 Essential (primary) hypertension: Secondary | ICD-10-CM | POA: Diagnosis not present

## 2013-10-18 NOTE — Patient Instructions (Signed)
Lunges forward and backwards Hip abduction and adduction Standing rotation  Hip flexion and extension

## 2013-10-18 NOTE — Progress Notes (Signed)
  Gregory [name] - 53 y.o. male MRN [id number]  Date of birth: 03/05/1960  SUBJECTIVE:  Including CC & ROS.  Patient is a 53 year old African American male who presents today complaining of right hamstring and lateral hip pain. He's been having issues with this leg for several months now. Has a history of significant injury on his left leg status post open reduction internal fixation and left total knee arthroscopic replacement in 2013. He also is a known leg length discrepancy with shortening of the left leg compared to the right. Patient was seen last month and was given a heel lift which has improved gait and functional leg length discrepancy he continues to have pain. Patient reports that the weakness in his left leg and hamstring difficulty walking and walking up stairs. Inability to play basketball. He was started on Flexeril, gabapentin and tramadol and has been taking all these medications with no difficulty t and having some relief.   ROS: Review of systems otherwise negative except for information present in HPI  HISTORY: Past Medical, Surgical, Social, and Family History Reviewed & Updated per EMR. Pertinent Historical Findings include: History of fracture and total knee replacement on the left causes hip dysfunction Leg length discrepancy Bipolar disorder  PHYSICAL EXAM:  VS: BP:118/84 mmHg  HR: bpm  TEMP: ( )  RESP:   HT:5\' 7"  (170.2 cm)   WT:167 lb (75.751 kg)  BMI:26.2 PHYSICAL EXAM: HIP EXAM:  General: well nourished Skin of LE: warm; dry, no rashes, lesions, ecchymosis or erythema. Vascular: radial pulses 2+ bilaterally Neurologically: Sensation to light touch lower extremities equal and intact bilaterally.    Observation - no ecchymosis, edema, or hematoma present over the anterior, lateral, or posterior soft tissue surrounding the hip Palpation:Severe tenderness over the initial tuberosity at the insertion of the hamstring tendon,  No tenderness of the lateral greater  trochanter or bursa, No PSIS tenderness or SI joint pain ROM: Normal Hip motion in flexion, extension, internal and external rotation Muscle strength:Significant muscle weakness and hip extension indicating significant weakness in the hamstring tendon of 4/5 compared to left. Also weakness in the gluteus medius muscle with hip abduction also for a 5 compared to left. Quad weakness 4/5 compared to left  ASSESSMENT & PLAN: See problem based charting & AVS for pt instructions.

## 2013-10-18 NOTE — Assessment & Plan Note (Signed)
Persistent hamstring strain and weakness  Recommendations: Patient was placed in a hamstring body helix sleeve for compression and inflammation Provided patient with handouts of several exercises to work on including lunges, hip external rotation, hip flexion extension Recommended continuing Flexeril, tramadol gabapentin for the pain control Followup in 4-6 weeks

## 2013-11-16 ENCOUNTER — Ambulatory Visit (INDEPENDENT_AMBULATORY_CARE_PROVIDER_SITE_OTHER): Payer: Medicare Other | Admitting: Sports Medicine

## 2013-11-16 ENCOUNTER — Encounter: Payer: Self-pay | Admitting: Sports Medicine

## 2013-11-16 VITALS — BP 137/88 | HR 66 | Ht 67.0 in | Wt 167.0 lb

## 2013-11-16 DIAGNOSIS — N182 Chronic kidney disease, stage 2 (mild): Secondary | ICD-10-CM | POA: Diagnosis not present

## 2013-11-16 DIAGNOSIS — Z23 Encounter for immunization: Secondary | ICD-10-CM | POA: Diagnosis not present

## 2013-11-16 DIAGNOSIS — Z5189 Encounter for other specified aftercare: Secondary | ICD-10-CM | POA: Diagnosis not present

## 2013-11-16 DIAGNOSIS — S6710XA Crushing injury of unspecified finger(s), initial encounter: Secondary | ICD-10-CM | POA: Diagnosis not present

## 2013-11-16 DIAGNOSIS — Z5320 Procedure and treatment not carried out because of patient's decision for unspecified reasons: Secondary | ICD-10-CM | POA: Diagnosis not present

## 2013-11-16 DIAGNOSIS — S0993XA Unspecified injury of face, initial encounter: Secondary | ICD-10-CM | POA: Diagnosis not present

## 2013-11-16 DIAGNOSIS — IMO0002 Reserved for concepts with insufficient information to code with codable children: Secondary | ICD-10-CM | POA: Diagnosis not present

## 2013-11-16 DIAGNOSIS — R7309 Other abnormal glucose: Secondary | ICD-10-CM | POA: Diagnosis not present

## 2013-11-16 DIAGNOSIS — I10 Essential (primary) hypertension: Secondary | ICD-10-CM | POA: Diagnosis not present

## 2013-11-16 DIAGNOSIS — M25569 Pain in unspecified knee: Secondary | ICD-10-CM

## 2013-11-16 DIAGNOSIS — R059 Cough, unspecified: Secondary | ICD-10-CM | POA: Diagnosis not present

## 2013-11-16 DIAGNOSIS — M2170 Unequal limb length (acquired), unspecified site: Secondary | ICD-10-CM | POA: Diagnosis not present

## 2013-11-16 DIAGNOSIS — G4733 Obstructive sleep apnea (adult) (pediatric): Secondary | ICD-10-CM | POA: Diagnosis not present

## 2013-11-16 DIAGNOSIS — M25562 Pain in left knee: Secondary | ICD-10-CM

## 2013-11-16 DIAGNOSIS — R634 Abnormal weight loss: Secondary | ICD-10-CM | POA: Diagnosis not present

## 2013-11-16 DIAGNOSIS — E7800 Pure hypercholesterolemia, unspecified: Secondary | ICD-10-CM | POA: Diagnosis not present

## 2013-11-16 DIAGNOSIS — Z13220 Encounter for screening for lipoid disorders: Secondary | ICD-10-CM | POA: Diagnosis not present

## 2013-11-16 MED ORDER — MELOXICAM 15 MG PO TABS: 15.0000 mg | ORAL_TABLET | Freq: Every day | ORAL | Status: DC

## 2013-11-16 NOTE — Assessment & Plan Note (Signed)
He seems to have a persistent reaction in the left knee that is s/p TKR  Trial using a DonJoy knee brace  Start on Mobic 15 for pain  I took him out of try to walk to school  Increase his Flexeril to 2 at nighttime  Recheck in 6-8 weeks to see if he's getting some improvement
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
## 2013-11-23 ENCOUNTER — Ambulatory Visit (INDEPENDENT_AMBULATORY_CARE_PROVIDER_SITE_OTHER): Payer: Medicare Other | Admitting: Family Medicine

## 2013-11-23 VITALS — BP 114/81 | HR 81 | Temp 97.9°F | Ht 67.0 in | Wt 168.1 lb

## 2013-11-23 DIAGNOSIS — Z5329 Procedure and treatment not carried out because of patient's decision for other reasons: Secondary | ICD-10-CM

## 2013-11-23 DIAGNOSIS — R413 Other amnesia: Secondary | ICD-10-CM | POA: Diagnosis not present

## 2013-11-23 DIAGNOSIS — R05 Cough: Secondary | ICD-10-CM

## 2013-11-23 DIAGNOSIS — Z23 Encounter for immunization: Secondary | ICD-10-CM

## 2013-11-23 DIAGNOSIS — R269 Unspecified abnormalities of gait and mobility: Secondary | ICD-10-CM

## 2013-11-23 DIAGNOSIS — Z114 Encounter for screening for human immunodeficiency virus [HIV]: Secondary | ICD-10-CM | POA: Diagnosis not present

## 2013-11-23 DIAGNOSIS — R412 Retrograde amnesia: Secondary | ICD-10-CM | POA: Diagnosis not present

## 2013-11-23 DIAGNOSIS — Z5320 Procedure and treatment not carried out because of patient's decision for unspecified reasons: Secondary | ICD-10-CM

## 2013-11-23 DIAGNOSIS — R059 Cough, unspecified: Secondary | ICD-10-CM
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
## 2013-11-23 NOTE — Assessment & Plan Note (Addendum)
Discussed w/u of this and pt amenable to completing the course at this time. Will order CMET, CBC, B12, TSH, RPR, HIV, and CT w/o contrast. Will need corroboration of family members that this has been insidious/affecting daily functioning/short term anterograde amnesia to formally dx with alzheimers if his w/u comes back negative. Referral to neurology is quite appropriate at this time due to previous TBI and Hx of migraine.  Will place today and f/u in 2-3 months, consider MMSE at that time.

## 2013-11-23 NOTE — Patient Instructions (Signed)
We will refer you to neurology.  Please see [place] back in about 3 months.  Thanks, Dr. Awanda Mink

## 2013-11-23 NOTE — Assessment & Plan Note (Signed)
Information given for colonoscopy, explained benefits and risks, and pt still having hesitation 2/2 the prep.  Will continue to discuss w/ pt.

## 2013-11-24 LAB — CBC
HCT: 39.5 % (ref 39.0–52.0)
Hemoglobin: 13.3 g/dL (ref 13.0–17.0)
MCH: 30.1 pg (ref 26.0–34.0)
MCHC: 33.7 g/dL (ref 30.0–36.0)
MCV: 89.4 fL (ref 78.0–100.0)
PLATELETS: 318 10*3/uL (ref 150–400)
RBC: 4.42 MIL/uL (ref 4.22–5.81)
RDW: 13.7 % (ref 11.5–15.5)
WBC: 6.5 10*3/uL (ref 4.0–10.5)

## 2013-11-24 LAB — COMPREHENSIVE METABOLIC PANEL
ALK PHOS: 81 U/L (ref 39–117)
ALT: 15 U/L (ref 0–53)
AST: 16 U/L (ref 0–37)
Albumin: 4 g/dL (ref 3.5–5.2)
BUN: 13 mg/dL (ref 6–23)
CALCIUM: 8.5 mg/dL (ref 8.4–10.5)
CHLORIDE: 107 meq/L (ref 96–112)
CO2: 25 mEq/L (ref 19–32)
Creat: 1.58 mg/dL — ABNORMAL HIGH (ref 0.50–1.35)
Glucose, Bld: 87 mg/dL (ref 70–99)
Potassium: 4.4 mEq/L (ref 3.5–5.3)
SODIUM: 138 meq/L (ref 135–145)
TOTAL PROTEIN: 6.7 g/dL (ref 6.0–8.3)
Total Bilirubin: 0.2 mg/dL (ref 0.2–1.2)

## 2013-11-24 LAB — RPR

## 2013-11-24 LAB — HIV ANTIBODY (ROUTINE TESTING W REFLEX): HIV 1&2 Ab, 4th Generation: NONREACTIVE

## 2013-11-24 LAB — TSH: TSH: 1.302 u[IU]/mL (ref 0.350–4.500)

## 2013-11-25 ENCOUNTER — Ambulatory Visit (INDEPENDENT_AMBULATORY_CARE_PROVIDER_SITE_OTHER): Payer: Medicare Other | Admitting: Neurology

## 2013-11-25 ENCOUNTER — Telehealth: Payer: Self-pay | Admitting: Neurology

## 2013-11-25 ENCOUNTER — Encounter: Payer: Self-pay | Admitting: Neurology

## 2013-11-25 VITALS — BP 123/78 | HR 77 | Ht 67.0 in | Wt 175.0 lb

## 2013-11-25 DIAGNOSIS — R413 Other amnesia: Secondary | ICD-10-CM | POA: Diagnosis not present

## 2013-11-25 DIAGNOSIS — G43011 Migraine without aura, intractable, with status migrainosus: Secondary | ICD-10-CM

## 2013-11-25 MED ORDER — TOPIRAMATE 100 MG PO TABS: 100.0000 mg | ORAL_TABLET | Freq: Two times a day (BID) | ORAL | Status: DC

## 2013-11-25 MED ORDER — RIZATRIPTAN BENZOATE 5 MG PO TBDP: 5.0000 mg | ORAL_TABLET | ORAL | Status: DC | PRN

## 2013-11-25 NOTE — Telephone Encounter (Signed)
Jessica: Please call his pharmacy, I have changed his Topamax to 100 mg twice a day,
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
## 2013-11-25 NOTE — Telephone Encounter (Signed)
Rx has been updated and resent.  I called the pharmacy.  Spoke with Crystal.  She is aware of the change.

## 2013-11-25 NOTE — Telephone Encounter (Signed)
Sunday Spillers from [facility] calling to get clarification on patient's Topamax directions, please call and advise.

## 2013-11-26 LAB — FOLATE: Folate: 5.9 ng/mL (ref >3.0)

## 2013-11-26 LAB — VITAMIN B12: Vitamin B-12: 300 pg/mL (ref 211–946)

## 2013-12-06 ENCOUNTER — Ambulatory Visit (INDEPENDENT_AMBULATORY_CARE_PROVIDER_SITE_OTHER): Payer: Medicare Other | Admitting: Radiology

## 2013-12-06 DIAGNOSIS — G43011 Migraine without aura, intractable, with status migrainosus: Secondary | ICD-10-CM

## 2013-12-06 DIAGNOSIS — R413 Other amnesia: Secondary | ICD-10-CM | POA: Diagnosis not present
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
## 2013-12-19 ENCOUNTER — Other Ambulatory Visit: Payer: Self-pay | Admitting: *Deleted

## 2013-12-19 ENCOUNTER — Ambulatory Visit: Payer: Medicare Other

## 2013-12-19 MED ORDER — MELOXICAM 15 MG PO TABS: 15.0000 mg | ORAL_TABLET | Freq: Every day | ORAL | Status: DC

## 2013-12-26 ENCOUNTER — Encounter: Payer: Self-pay | Admitting: Neurology

## 2013-12-26 ENCOUNTER — Ambulatory Visit (INDEPENDENT_AMBULATORY_CARE_PROVIDER_SITE_OTHER): Payer: Commercial Managed Care - HMO | Admitting: Neurology

## 2013-12-26 VITALS — BP 139/93 | HR 73 | Ht 67.0 in | Wt 178.0 lb

## 2013-12-26 DIAGNOSIS — G43011 Migraine without aura, intractable, with status migrainosus: Secondary | ICD-10-CM

## 2013-12-26 DIAGNOSIS — R413 Other amnesia: Secondary | ICD-10-CM
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
## 2013-12-31 ENCOUNTER — Inpatient Hospital Stay: Admission: RE | Admit: 2013-12-31 | Payer: Medicare Other | Source: Ambulatory Visit

## 2014-01-11 ENCOUNTER — Ambulatory Visit: Payer: Medicare Other | Admitting: Sports Medicine

## 2014-01-14 ENCOUNTER — Other Ambulatory Visit: Payer: Medicare Other

## 2014-01-23 ENCOUNTER — Ambulatory Visit: Payer: Medicare Other | Admitting: Neurology

## 2014-01-24 ENCOUNTER — Encounter: Payer: Self-pay | Admitting: *Deleted

## 2014-01-31 ENCOUNTER — Telehealth: Payer: Self-pay | Admitting: Neurology

## 2014-01-31 NOTE — Telephone Encounter (Signed)
Patient's spouse called and was inquiring why they received a no show letter for Dec. 7, 2015 appointment with Dr. Krista Blue. I informed the patient's spouse that they have to give a 24 hour notice. Patient stated that the reason they didn't keep this appointment was due to the MRI scan wasn't approved. Patient's spouse stated that the scan was just approved and that she will be calling [facility] to schedule and will callback to the office to schedule this appointment.

## 2014-02-16 ENCOUNTER — Other Ambulatory Visit: Payer: Self-pay | Admitting: Family Medicine

## 2014-04-25 ENCOUNTER — Other Ambulatory Visit: Payer: Self-pay | Admitting: Family Medicine

## 2014-04-26 NOTE — Telephone Encounter (Signed)
[facility]one [facility] ol[facility]apine [facility]ill [facility] [address]

## 2014-04-26 NOTE — Telephone Encounter (Signed)
Patient has not received these medication since 2014 per our records. If he has been getting them from another source he should request them from that physician. If not he should be seen in clinic for these refills. Please inform the patient. Thanks.

## 2014-04-26 NOTE — Telephone Encounter (Signed)
Called pt and informed him of below message and he was very upset and ended up handing his wife the phone.  I stated that he needed to come in for a visit since it had been so long since the medicine had been prescribed.  She requested to see if she could get just one trazadone for the night and i asked the preceptor Nori Riis) and she stated no so I scheduled him on SDA tomorrow with Netty at [facility], Susan Bleich D

## 2014-04-27 ENCOUNTER — Ambulatory Visit: Payer: Medicare Other | Admitting: Family Medicine

## 2014-05-01 ENCOUNTER — Other Ambulatory Visit: Payer: Self-pay | Admitting: Family Medicine

## 2014-05-01 ENCOUNTER — Ambulatory Visit (INDEPENDENT_AMBULATORY_CARE_PROVIDER_SITE_OTHER): Payer: Commercial Managed Care - HMO | Admitting: Family Medicine

## 2014-05-01 ENCOUNTER — Encounter: Payer: Self-pay | Admitting: Family Medicine

## 2014-05-01 ENCOUNTER — Telehealth: Payer: Self-pay | Admitting: Family Medicine

## 2014-05-01 ENCOUNTER — Encounter: Payer: Self-pay | Admitting: *Deleted

## 2014-05-01 VITALS — BP 134/93 | HR 66 | Temp 97.6°F | Ht 67.0 in | Wt 169.6 lb

## 2014-05-01 VITALS — BP 146/92 | HR 72 | Ht 67.0 in | Wt 166.0 lb

## 2014-05-01 DIAGNOSIS — Z5329 Procedure and treatment not carried out because of patient's decision for other reasons: Secondary | ICD-10-CM

## 2014-05-01 DIAGNOSIS — Z981 Arthrodesis status: Secondary | ICD-10-CM | POA: Diagnosis not present

## 2014-05-01 DIAGNOSIS — R634 Abnormal weight loss: Secondary | ICD-10-CM | POA: Diagnosis not present

## 2014-05-01 DIAGNOSIS — Z5320 Procedure and treatment not carried out because of patient's decision for unspecified reasons: Secondary | ICD-10-CM

## 2014-05-01 DIAGNOSIS — G4700 Insomnia, unspecified: Secondary | ICD-10-CM | POA: Diagnosis not present

## 2014-05-01 DIAGNOSIS — M25562 Pain in left knee: Secondary | ICD-10-CM

## 2014-05-01 DIAGNOSIS — M5410 Radiculopathy, site unspecified: Secondary | ICD-10-CM

## 2014-05-01 MED ORDER — TRAMADOL HCL 50 MG PO TABS: 100.0000 mg | ORAL_TABLET | Freq: Three times a day (TID) | ORAL | Status: DC | PRN

## 2014-05-01 MED ORDER — PANTOPRAZOLE SODIUM 40 MG PO TBEC: 40.0000 mg | DELAYED_RELEASE_TABLET | Freq: Every day | ORAL | Status: DC

## 2014-05-01 MED ORDER — MEGESTROL ACETATE 625 MG/5ML PO SUSP: 625.0000 mg | Freq: Every day | ORAL | Status: DC

## 2014-05-01 MED ORDER — PREDNISONE 10 MG PO TABS: ORAL_TABLET | ORAL | Status: DC

## 2014-05-01 NOTE — Telephone Encounter (Signed)
[facility] and [facility] said [facility] should be ready for patient and that there is "2 profiles" on patient.    Patient's wife has been notified

## 2014-05-01 NOTE — Progress Notes (Signed)
[facility]rol 625 mg/5 ML. Formulary and PA form placed in provider box for completion. Derl Barrow, RN

## 2014-05-01 NOTE — Assessment & Plan Note (Signed)
Continue on Trazodone, sleep hygiene.  F/U PRN

## 2014-05-01 NOTE — Telephone Encounter (Signed)
[facility] [address] [facility] [place] said that pt Rx has never been sent, trazadone, and pt wife would like to know what is taking so long because she said she spoke with the PCP today and he stated that he sent it in Friday. Pt went tot pharmacy and they have yet to receive it. Please call pt wife when sent / sr
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
## 2014-05-01 NOTE — Patient Instructions (Signed)
Colonoscopy  A colonoscopy is an exam to look at the entire large intestine (colon). This exam can help find problems such as tumors, polyps, inflammation, and areas of bleeding. The exam takes about 1 hour.   LET YOUR HEALTH CARE PROVIDER KNOW ABOUT:   · Any allergies you have.  · All medicines you are taking, including vitamins, herbs, eye drops, creams, and over-the-counter medicines.  · Previous problems you or members of your family have had with the use of anesthetics.  · Any blood disorders you have.  · Previous surgeries you have had.  · Medical conditions you have.  RISKS AND COMPLICATIONS   Generally, this is a safe procedure. However, as with any procedure, complications can occur. Possible complications include:  · Bleeding.  · Tearing or rupture of the colon wall.  · Reaction to medicines given during the exam.  · Infection (rare).  BEFORE THE PROCEDURE   · Ask your health care provider about changing or stopping your regular medicines.  · You may be prescribed an oral bowel prep. This involves drinking a large amount of medicated liquid, starting the day before your procedure. The liquid will cause you to have multiple loose stools until your stool is almost clear or light green. This cleans out your colon in preparation for the procedure.  · Do not eat or drink anything else once you have started the bowel prep, unless your health care provider tells you it is safe to do so.  · Arrange for someone to drive you home after the procedure.  PROCEDURE   · You will be given medicine to help you relax (sedative).  · You will lie on your side with your knees bent.  · A long, flexible tube with a light and camera on the end (colonoscope) will be inserted through the rectum and into the colon. The camera sends video back to a computer screen as it moves through the colon. The colonoscope also releases carbon dioxide gas to inflate the colon. This helps your health care provider see the area better.  · During  the exam, your health care provider may take a small tissue sample (biopsy) to be examined under a microscope if any abnormalities are found.  · The exam is finished when the entire colon has been viewed.  AFTER THE PROCEDURE   · Do not drive for 24 hours after the exam.  · You may have a small amount of blood in your stool.  · You may pass moderate amounts of gas and have mild abdominal cramping or bloating. This is caused by the gas used to inflate your colon during the exam.  · Ask when your test results will be ready and how you will get your results. Make sure you get your test results.  Document Released: 02/01/2000 Document Revised: 11/24/2012 Document Reviewed: 10/11/2012  ExitCare® Patient Information ©2015 [employer]. This information is not intended to replace advice given to you by your health care provider. Make sure you discuss any questions you have with your health care provider.

## 2014-05-01 NOTE — Assessment & Plan Note (Signed)
Information given again for colonoscopy, emphasized importance of this.  Pt still hesitant 2/2 prep.  Continue to bring up and discuss w/ pt.

## 2014-05-02 ENCOUNTER — Telehealth: Payer: Self-pay | Admitting: Family Medicine

## 2014-05-02 DIAGNOSIS — Z981 Arthrodesis status: Secondary | ICD-10-CM | POA: Insufficient documentation

## 2014-05-02 NOTE — Progress Notes (Signed)
PA faxed to [facility] for review.  Derl Barrow, RN

## 2014-05-02 NOTE — Assessment & Plan Note (Signed)
Current situation suspicious for herniated disc. We'll set him up for MRI. Tramadol for pain. Discussed at length with him and his wife and should he have new or worsening symptoms to proceed to the emergency room.

## 2014-05-02 NOTE — Telephone Encounter (Signed)
Anderson Malta from [facility] called and wanted the nurses for Dr. Awanda Mink to know that his results are still pending because of clinicals. Please call her at [phone] [facility]. jw
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
## 2014-05-02 NOTE — Progress Notes (Signed)
Completed and returned.  Tamela Oddi Awanda Mink, DO of [facility] 05/02/2014, 8:58 AM

## 2014-05-03 NOTE — Progress Notes (Signed)
PA for Megestrol 625 mg/5ML approved from [facility] until 02/17/2015.  [facility] aware of approval. Derl Barrow, RN

## 2014-05-04 ENCOUNTER — Other Ambulatory Visit: Payer: Medicare Other

## 2014-07-10 ENCOUNTER — Other Ambulatory Visit: Payer: Self-pay | Admitting: *Deleted

## 2014-07-10 MED ORDER — MELOXICAM 15 MG PO TABS: 15.0000 mg | ORAL_TABLET | Freq: Every day | ORAL | Status: DC

## 2014-08-11 ENCOUNTER — Ambulatory Visit: Payer: Medicare Other | Admitting: Family Medicine

## 2014-09-04 ENCOUNTER — Ambulatory Visit: Payer: Medicare Other | Admitting: Family Medicine

## 2014-09-05 ENCOUNTER — Other Ambulatory Visit: Payer: Self-pay | Admitting: Family Medicine

## 2014-09-05 NOTE — Telephone Encounter (Signed)
Did you want to refill this or send to [facility]

## 2014-09-07 ENCOUNTER — Encounter: Payer: Self-pay | Admitting: Family Medicine

## 2014-09-07 ENCOUNTER — Ambulatory Visit (INDEPENDENT_AMBULATORY_CARE_PROVIDER_SITE_OTHER): Payer: Commercial Managed Care - HMO | Admitting: Family Medicine

## 2014-09-07 VITALS — BP 125/81 | HR 59 | Temp 98.2°F | Ht 67.0 in | Wt 172.4 lb

## 2014-09-07 DIAGNOSIS — K219 Gastro-esophageal reflux disease without esophagitis: Secondary | ICD-10-CM

## 2014-09-07 DIAGNOSIS — R0981 Nasal congestion: Secondary | ICD-10-CM | POA: Diagnosis not present

## 2014-09-07 DIAGNOSIS — J302 Other seasonal allergic rhinitis: Secondary | ICD-10-CM | POA: Insufficient documentation

## 2014-09-07 MED ORDER — CETIRIZINE HCL 10 MG PO TABS: 10.0000 mg | ORAL_TABLET | Freq: Every day | ORAL | Status: DC

## 2014-09-07 MED ORDER — DEXLANSOPRAZOLE 60 MG PO CPDR: 60.0000 mg | DELAYED_RELEASE_CAPSULE | Freq: Every day | ORAL | Status: DC

## 2014-09-07 MED ORDER — FLUTICASONE PROPIONATE 50 MCG/ACT NA SUSP: 2.0000 | Freq: Every day | NASAL | Status: DC

## 2014-09-07 MED ORDER — FLUOXETINE HCL 20 MG PO CAPS: ORAL_CAPSULE | ORAL | Status: DC

## 2014-09-07 NOTE — Assessment & Plan Note (Signed)
Sinus congestion and mildly productive cough for several months, likely due to postnasal drip +/- GERD - Continue PPI; advised him to call his gastroenterologist and scheduled follow-up - Start Zyrtec and Flonase; Advise using nasal saline after working outside - f/u with PCP in 2-3 weeks

## 2014-09-07 NOTE — Progress Notes (Signed)
   Subjective:    Patient ID: Gregory [name], male    DOB: March 05, 1960, 54 y.o.   MRN: [id number]  Seen for Same day visit for   CC: cough  He reports nasal congestion and productive cough over the past month.  Denies any fevers, chills, chest pain, or trouble breathing.  Cough has been somewhat productive, he denies fevers, chills, or sick contacts.  He has not smoked in several years.  He continues to endorse heartburn.  Reports working outside a lot and having a lot of allergies; no longer taking histamines.   Review of Systems   See HPI for ROS. Objective:  BP 125/81 mmHg  Pulse 59  Temp(Src) 98.2 F (36.8 C) (Oral)  Ht 5\' 7"  (1.702 m)  Wt 172 lb 7 oz (78.217 kg)  BMI 27.00 kg/m2  General: NAD HEENT: Swollen nasal turbinates bilaterally Cardiac: RRR, normal heart sounds, no murmurs. 2+ radial and PT pulses bilaterally Respiratory: CTAB, normal effort    Assessment & Plan:  See Problem List Documentation

## 2014-09-07 NOTE — Patient Instructions (Signed)
Fluticasone nasal spray (Flonase) What is this medicine? FLUTICASONE (floo TIK a sone) is a corticosteroid. This medicine is used to treat the symptoms of allergies like sneezing, itchy red eyes, and itchy, runny, or stuffy nose. This medicine may be used for other purposes; ask your health care provider or pharmacist if you have questions. COMMON BRAND NAME(S): Flonase, Flonase Allergy Relief, Veramyst What should I tell my health care provider before I take this medicine? They need to know if you have any of these conditions: -infection, like tuberculosis, herpes, or fungal infection -recent surgery on nose or sinuses -taking corticosteroid by mouth -an unusual or allergic reaction to fluticasone, steroids, other medicines, foods, dyes, or preservatives -pregnant or trying to get pregnant -breast-feeding How should I use this medicine? This medicine is for use in the nose. Follow the directions on your product or prescription label. This medicine works best if used at regular intervals. Do not use more often than directed. Make sure that you are using your nasal spray correctly. After 6 months of daily use without a prescription, talk to your doctor or health care professional before using it for a longer time. Ask your doctor or health care professional if you have any questions. Talk to your pediatrician regarding the use of this medicine in children. While this drug may be used for children as young as 49 years old for selected conditions, precautions do apply. After 2 months of daily use without a prescription in a child, talk to your pediatrician before using it for a longer time. Overdosage: If you think you have taken too much of this medicine contact a poison control center or emergency room at once. NOTE: This medicine is only for you. Do not share this medicine with others. What if I miss a dose? If you miss a dose, use it as soon as you remember. If it is almost time for your next  dose, use only that dose and continue with your regular schedule. Do not use double or extra doses. What may interact with this medicine? -ketoconazole -metyrapone -some medicines for HIV -vaccines This list may not describe all possible interactions. Give your health care provider a list of all the medicines, herbs, non-prescription drugs, or dietary supplements you use. Also tell them if you smoke, drink alcohol, or use illegal drugs. Some items may interact with your medicine. What should I watch for while using this medicine? Visit your doctor or health care professional for regular checks on your progress. Some symptoms may improve within 12 hours after starting use. Check with your doctor or health care professional if there is no improvement in your condition after 3 weeks of use. Do not come in contact with people who have chickenpox or the measles while you are taking this medicine. If you do, call your doctor right away. What side effects may I notice from receiving this medicine? Side effects that you should report to your doctor or health care professional as soon as possible: -allergic reactions like skin rash, itching or hives, swelling of the face, lips, or tongue -changes in vision -flu-like symptoms -white patches or sores in the mouth or nose Side effects that usually do not require medical attention (report to your doctor or health care professional if they continue or are bothersome): -burning or irritation inside the nose or throat -cough -headache -nosebleed -unusual taste or smell This list may not describe all possible side effects. Call your doctor for medical advice about side effects. You may  report side effects to FDA at 1-800-FDA-1088. Where should I keep my medicine? Keep out of the reach of children. Store at room temperature between 15 and 30 degrees C (59 and 86 degrees F). Throw away any unused medicine after the expiration date. NOTE: This sheet is a  summary. It may not cover all possible information. If you have questions about this medicine, talk to your doctor, pharmacist, or health care provider.  2015, Elsevier/Gold Standard. (2013-05-26 15:55:20)

## 2015-02-01 ENCOUNTER — Other Ambulatory Visit: Payer: Self-pay | Admitting: *Deleted

## 2015-02-01 MED ORDER — MELOXICAM 15 MG PO TABS: 15.0000 mg | ORAL_TABLET | Freq: Every day | ORAL | Status: DC

## 2015-04-16 ENCOUNTER — Other Ambulatory Visit: Payer: Self-pay | Admitting: *Deleted

## 2015-04-16 MED ORDER — MELOXICAM 15 MG PO TABS: 15.0000 mg | ORAL_TABLET | Freq: Every day | ORAL | Status: DC

## 2015-04-28 ENCOUNTER — Other Ambulatory Visit: Payer: Self-pay | Admitting: Family Medicine

## 2015-05-06 ENCOUNTER — Other Ambulatory Visit: Payer: Self-pay | Admitting: Family Medicine

## 2015-05-07 NOTE — Telephone Encounter (Signed)
This is your patient, see med refill request 

## 2015-05-08 NOTE — Telephone Encounter (Signed)
Refill trazodone 1x.  Patient has not been seen since 08/2014.  Please have him schedule an appointment for annual exam in next couple months.

## 2015-05-09 ENCOUNTER — Encounter: Payer: Self-pay | Admitting: Family Medicine

## 2015-05-09 ENCOUNTER — Other Ambulatory Visit: Payer: Self-pay | Admitting: Family Medicine

## 2015-05-09 ENCOUNTER — Ambulatory Visit (INDEPENDENT_AMBULATORY_CARE_PROVIDER_SITE_OTHER): Payer: Medicare Other | Admitting: Family Medicine

## 2015-05-09 ENCOUNTER — Ambulatory Visit
Admission: RE | Admit: 2015-05-09 | Discharge: 2015-05-09 | Disposition: A | Discharge status: Home / Self Care | Payer: Medicare Other | Source: Ambulatory Visit | Attending: Family Medicine | Admitting: Family Medicine

## 2015-05-09 VITALS — BP 136/97 | HR 80 | Ht 67.0 in | Wt 184.0 lb

## 2015-05-09 DIAGNOSIS — M170 Bilateral primary osteoarthritis of knee: Secondary | ICD-10-CM | POA: Diagnosis not present

## 2015-05-09 MED ORDER — TRAMADOL HCL 50 MG PO TABS: 50.0000 mg | ORAL_TABLET | Freq: Three times a day (TID) | ORAL | Status: DC | PRN

## 2015-05-09 NOTE — Telephone Encounter (Signed)
This is your patient, see med refill req 

## 2015-05-09 NOTE — Assessment & Plan Note (Signed)
Pt with known R OA but most recent x-rays were around 5 years ago.   He has had a L TKA done by Dr. Wynelle Link in 2010.   - Will repeat x-rays on both knees and pending the results with either manage with conservative tx with injections/medications/bracing for the R vs possible evaluation again by [facility] for repeat procedure.

## 2015-05-09 NOTE — Progress Notes (Signed)
  Gregory [name] - 55 y.o. male MRN [id number]  Date of birth: May 03, 1960 Gregory [name] is a 55 y.o. male who presents today for B/L Knee pain.  L knee, previous TKA in 2010 by Dr. Wynelle Link - patient with ongoing left knee pain but denies swelling. He did have a total left knee replacement performed 2010 secondary to secondary osteoarthritis of the knee due to traumatic tibia and femur fractures. Has taken tramadol which does help with the pain. Denies repeat injury or feeling that the prosthesis has migrated or, place.  R knee, initial visit with known OA - patient states he had previous x-rays performed an outside office and was told that he has known osteoarthritis. Pain worse at the end of the day or prolonged use. Does ache and occasionally will swell. Does state instability and giving way at times. Tramadol does help with this.  PMHx - Updated and reviewed.  Contributory factors include: HTN, herniated disc PSHx - Updated and reviewed.  Contributory factors include:  TKA L, CTS B/L, C4/C5 fusion by Dr. Narda Amber Neurosurgery  FHx - Updated and reviewed.  Contributory factors include:  HTN Father  Social Hx - Updated and reviewed. Contributory factors include: Non smoker   Medications - Updated and reviewed    ROS Per HPI.  12 point negative other than per HPI.   Exam:  Filed Vitals:   05/09/15 1343  BP: 136/97  Pulse: 80   Gen: NAD, AAO 3 Cardio- RRR Pulm - Normal respiratory effort/rate Skin: No rashes or erythema Extremities: No edema  Vascular: pulses +2 bilateral upper and lower extremity Psych: Normal affect   B/L Knee: No effusion B/L with obvious incision L anterior joint  TTP medial joint line B/L  Decreased ROM 10-100  Negative Mcmurray's and provocative meniscal tests. Non painful patellar compression. Patellar and quadriceps tendons unremarkable. Hamstring and quadriceps strength is normal. Neurovascular intact B/L LE

## 2015-05-17 NOTE — Telephone Encounter (Signed)
Pt informed, will have wife call. Vivia Rosenburg, Salome Spotted, CMA

## 2015-05-23 ENCOUNTER — Encounter: Payer: Self-pay | Admitting: Family Medicine

## 2015-05-23 ENCOUNTER — Ambulatory Visit (INDEPENDENT_AMBULATORY_CARE_PROVIDER_SITE_OTHER): Payer: Medicare Other | Admitting: Family Medicine

## 2015-05-23 VITALS — BP 126/84 | Ht 67.0 in | Wt 184.0 lb

## 2015-05-23 DIAGNOSIS — M25561 Pain in right knee: Secondary | ICD-10-CM | POA: Insufficient documentation

## 2015-05-23 NOTE — Progress Notes (Signed)
  TADARIUS AYLES - 55 y.o. male MRN [id number]  Date of birth: 30-May-1960 WINN WEIAND is a 56 y.o. male who presents today for R Knee pain.  R knee, initial visit pain 05/09/15- patient states he had previous x-rays performed an outside office and was told that he has known osteoarthritis. Pain worse at the end of the day or prolonged use. Does ache and occasionally will swell. Does state instability and giving way at times. Tramadol does help with this.  R knee f/u - X-ray from 05/09/15 showed preserved joint space with minimal to no evidence of early OA.  Continue to have instability at times and giving way.  No swelling at this time.   PMHx - Updated and reviewed.  Contributory factors include: HTN, herniated disc PSHx - Updated and reviewed.  Contributory factors include:  TKA L, CTS B/L, C4/C5 fusion by Dr. Narda Amber Neurosurgery  FHx - Updated and reviewed.  Contributory factors include:  HTN Father  Social Hx - Updated and reviewed. Contributory factors include: Non smoker   Medications - Updated and reviewed    ROS Per HPI.  12 point negative other than per HPI.   Exam:  Filed Vitals:   05/23/15 1333  BP: 126/84   Gen: NAD, AAO 3 Cardio- RRR Pulm - Normal respiratory effort/rate Skin: No rashes or erythema Extremities: No edema  Vascular: pulses +2 bilateral upper and lower extremity Psych: Normal affect   R Knee:  No effusion  TTP medial joint line  Decreased ROM 0-110 Equivocal McMurray's and provocative meniscal tests. Non painful patellar compression. Patellar and quadriceps tendons unremarkable. Hamstring and quadriceps strength is normal. Neurovascular intact B/L LE  Imaging: R knee 3/22 - Good joint space alignment with preservation.  No osteophytosis or sclerosis.  No acute fx or chronic process noted.

## 2015-05-23 NOTE — Assessment & Plan Note (Signed)
X-rays basically normal without any evidence of underlying osteoarthritis.  This is been ongoing condition and states his knee occasionally gives way and feels unstable. With these ongoing symptoms for the past several months we will go ahead and perform an MRI due to the debilitating symptoms. Call with results and is previously been seen at [facility].

## 2015-05-29 ENCOUNTER — Ambulatory Visit
Admission: RE | Admit: 2015-05-29 | Discharge: 2015-05-29 | Disposition: A | Discharge status: Home / Self Care | Payer: Medicare Other | Source: Ambulatory Visit | Attending: Family Medicine | Admitting: Family Medicine

## 2015-05-29 DIAGNOSIS — M25561 Pain in right knee: Secondary | ICD-10-CM

## 2015-05-30 ENCOUNTER — Ambulatory Visit (INDEPENDENT_AMBULATORY_CARE_PROVIDER_SITE_OTHER): Payer: Medicare Other | Admitting: Family Medicine

## 2015-05-30 ENCOUNTER — Encounter: Payer: Self-pay | Admitting: Family Medicine

## 2015-05-30 VITALS — BP 150/96 | HR 65 | Ht 67.0 in | Wt 184.0 lb

## 2015-05-30 DIAGNOSIS — M25561 Pain in right knee: Secondary | ICD-10-CM | POA: Diagnosis not present

## 2015-05-30 NOTE — Assessment & Plan Note (Signed)
MRI showing M/L meniscus tears.  Offered conservative management with IAS injections vs ortho referral for possible arthroscopy.  Pt would like to discuss with surgery at this time.  F/U after this.

## 2015-05-30 NOTE — Progress Notes (Signed)
  Gregory [name] - 55 y.o. male MRN [id number]  Date of birth: 1961-01-20 Gregory [name] is a 55 y.o. male who presents today for R Knee pain.  R knee, initial visit pain 05/09/15- patient states he had previous x-rays performed an outside office and was told that he has known osteoarthritis. Pain worse at the end of the day or prolonged use. Does ache and occasionally will swell. Does state instability and giving way at times. Tramadol does help with this.  R knee f/u - X-ray from 05/09/15 showed preserved joint space with minimal to no evidence of early OA.  Continue to have instability at times and giving way.  No swelling at this time.   05/30/15 F/U R knee - Continues to have pain and worse with flexing the knee.  MRi on 05/29/15 showing med/lat meniscus tearing with some medial cartilage loss.   PMHx - Updated and reviewed.  Contributory factors include: HTN, herniated disc PSHx - Updated and reviewed.  Contributory factors include:  TKA L, CTS B/L, C4/C5 fusion by Dr. Narda Amber Neurosurgery  FHx - Updated and reviewed.  Contributory factors include:  HTN Father  Social Hx - Updated and reviewed. Contributory factors include: Non smoker   Medications - Updated and reviewed    ROS Per HPI.  12 point negative other than per HPI.   Exam:  Filed Vitals:   05/30/15 1517  BP: 150/96  Pulse: 65   Gen: NAD, AAO 3 Cardio- RRR Pulm - Normal respiratory effort/rate Skin: No rashes or erythema Extremities: No edema  Vascular: pulses +2 bilateral upper and lower extremity Psych: Normal affect   R Knee:  No effusion  TTP medial joint line  Decreased ROM 0-110 + McMurray's and provocative meniscal tests. Non painful patellar compression. Patellar and quadriceps tendons unremarkable. Hamstring and quadriceps strength is normal. Neurovascular intact B/L LE  Imaging: R knee 3/22 - Good joint space alignment with preservation.  No osteophytosis or sclerosis.  No acute fx or chronic process  noted.  MRI 05/29/15 R knee - MMT radial tear posterior horn and lateral meniscus tear anterior horn

## 2015-06-07 ENCOUNTER — Other Ambulatory Visit: Payer: Self-pay | Admitting: Family Medicine

## 2015-06-07 NOTE — Telephone Encounter (Signed)
Patient has not been seen in almost a year.  No labs in >2 years.  Needs to have BMP followed on this medication.  Please advise patient that he needs office visit for further refills.

## 2015-06-12 ENCOUNTER — Other Ambulatory Visit: Payer: Self-pay | Admitting: *Deleted

## 2015-06-12 MED ORDER — FLUOXETINE HCL 20 MG PO CAPS: ORAL_CAPSULE | ORAL | Status: DC

## 2015-06-12 NOTE — Telephone Encounter (Signed)
Wife called on behalf of patient to change his appt from Wednesday to 06/27/15.  States that he needs a refill on his fluoxetine.  Will forward to MD. Gregory [name]

## 2015-06-13 ENCOUNTER — Ambulatory Visit: Payer: Medicare Other | Admitting: Family Medicine

## 2015-06-14 NOTE — Telephone Encounter (Signed)
Pt has an appt on 06/27/2015 with Dr. Hall Busing, [place]

## 2015-06-16 ENCOUNTER — Ambulatory Visit (HOSPITAL_COMMUNITY)
Admission: EM | Admit: 2015-06-16 | Discharge: 2015-06-16 | Disposition: A | Discharge status: Home / Self Care | Payer: Worker's Compensation | Attending: Emergency Medicine | Admitting: Emergency Medicine

## 2015-06-16 ENCOUNTER — Ambulatory Visit (INDEPENDENT_AMBULATORY_CARE_PROVIDER_SITE_OTHER): Payer: Worker's Compensation

## 2015-06-16 ENCOUNTER — Encounter (HOSPITAL_COMMUNITY): Payer: Self-pay

## 2015-06-16 DIAGNOSIS — S76301A Unspecified injury of muscle, fascia and tendon of the posterior muscle group at thigh level, right thigh, initial encounter: Secondary | ICD-10-CM | POA: Diagnosis not present

## 2015-06-16 DIAGNOSIS — T1490 Injury, unspecified: Secondary | ICD-10-CM | POA: Diagnosis not present

## 2015-06-16 DIAGNOSIS — T1490XA Injury, unspecified, initial encounter: Secondary | ICD-10-CM

## 2015-06-16 MED ORDER — HYDROCODONE-ACETAMINOPHEN 5-325 MG PO TABS: 1.0000 | ORAL_TABLET | Freq: Four times a day (QID) | ORAL | Status: DC | PRN

## 2015-06-16 MED ORDER — MELOXICAM 15 MG PO TABS: 15.0000 mg | ORAL_TABLET | Freq: Every day | ORAL | Status: DC
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
## 2015-06-16 NOTE — ED Notes (Signed)
Patient is here for a W/C injury to right leg. Patient hurt his leg at work and needs to be checked out. No acute distress

## 2015-06-16 NOTE — Discharge Instructions (Signed)
You have injured your hamstrings. I suspect you partially torn the tendon. No weightbearing on the right leg until you can flex and extend your leg without too much discomfort. Apply ice at least 3 times a day. Continue your meloxicam daily. Use the hydrocodone every 4-6 hours as needed for pain. Do not take this with the tramadol. No driving. Please follow-up with Dr. Oneida Alar within the next week.

## 2015-06-27 ENCOUNTER — Ambulatory Visit: Payer: Medicare Other | Admitting: Family Medicine

## 2015-07-03 ENCOUNTER — Emergency Department (HOSPITAL_BASED_OUTPATIENT_CLINIC_OR_DEPARTMENT_OTHER)
Admit: 2015-07-03 | Discharge: 2015-07-03 | Disposition: A | Discharge status: Home / Self Care | Payer: Worker's Compensation

## 2015-07-03 ENCOUNTER — Encounter (HOSPITAL_COMMUNITY): Payer: Self-pay | Admitting: Emergency Medicine

## 2015-07-03 ENCOUNTER — Emergency Department (HOSPITAL_COMMUNITY)
Admission: EM | Admit: 2015-07-03 | Discharge: 2015-07-03 | Disposition: A | Discharge status: Home / Self Care | Payer: Worker's Compensation | Attending: Emergency Medicine | Admitting: Emergency Medicine

## 2015-07-03 DIAGNOSIS — Z96652 Presence of left artificial knee joint: Secondary | ICD-10-CM | POA: Insufficient documentation

## 2015-07-03 DIAGNOSIS — M79609 Pain in unspecified limb: Secondary | ICD-10-CM | POA: Diagnosis not present

## 2015-07-03 DIAGNOSIS — S76301A Unspecified injury of muscle, fascia and tendon of the posterior muscle group at thigh level, right thigh, initial encounter: Secondary | ICD-10-CM

## 2015-07-03 DIAGNOSIS — M7989 Other specified soft tissue disorders: Secondary | ICD-10-CM

## 2015-07-03 DIAGNOSIS — Y929 Unspecified place or not applicable: Secondary | ICD-10-CM | POA: Diagnosis not present

## 2015-07-03 DIAGNOSIS — M179 Osteoarthritis of knee, unspecified: Secondary | ICD-10-CM | POA: Diagnosis not present

## 2015-07-03 DIAGNOSIS — S79921A Unspecified injury of right thigh, initial encounter: Secondary | ICD-10-CM | POA: Insufficient documentation

## 2015-07-03 DIAGNOSIS — Y939 Activity, unspecified: Secondary | ICD-10-CM | POA: Diagnosis not present

## 2015-07-03 DIAGNOSIS — K219 Gastro-esophageal reflux disease without esophagitis: Secondary | ICD-10-CM | POA: Diagnosis not present

## 2015-07-03 DIAGNOSIS — Z79899 Other long term (current) drug therapy: Secondary | ICD-10-CM | POA: Diagnosis not present

## 2015-07-03 DIAGNOSIS — Z791 Long term (current) use of non-steroidal anti-inflammatories (NSAID): Secondary | ICD-10-CM | POA: Insufficient documentation

## 2015-07-03 DIAGNOSIS — Z79891 Long term (current) use of opiate analgesic: Secondary | ICD-10-CM | POA: Insufficient documentation

## 2015-07-03 DIAGNOSIS — Z87891 Personal history of nicotine dependence: Secondary | ICD-10-CM | POA: Insufficient documentation

## 2015-07-03 DIAGNOSIS — Z7952 Long term (current) use of systemic steroids: Secondary | ICD-10-CM | POA: Insufficient documentation

## 2015-07-03 DIAGNOSIS — M19022 Primary osteoarthritis, left elbow: Secondary | ICD-10-CM | POA: Diagnosis not present

## 2015-07-03 DIAGNOSIS — Y990 Civilian activity done for income or pay: Secondary | ICD-10-CM | POA: Diagnosis not present

## 2015-07-03 DIAGNOSIS — S8001XA Contusion of right knee, initial encounter: Secondary | ICD-10-CM | POA: Diagnosis not present

## 2015-07-03 MED ORDER — FENTANYL CITRATE (PF) 100 MCG/2ML IJ SOLN
100.0000 ug | Freq: Once | INTRAMUSCULAR | Status: AC
  Administered 2015-07-03: 100 ug via INTRAMUSCULAR
  Filled 2015-07-03: qty 2

## 2015-07-03 MED ORDER — FENTANYL CITRATE (PF) 100 MCG/2ML IJ SOLN: 100.0000 ug | Freq: Once | INTRAMUSCULAR | Status: DC

## 2015-07-03 NOTE — ED Notes (Signed)
Pt given a sandwich, cheese, and a drink, per EDP.  Pt's family given crackers and cheese.
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
## 2015-07-03 NOTE — ED Notes (Signed)
Pt reports understanding of discharge information. No questions at time of discharge 
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
## 2015-07-03 NOTE — Progress Notes (Signed)
VASCULAR LAB PRELIMINARY  PRELIMINARY  PRELIMINARY  PRELIMINARY  Right lower extremity venous duplex completed.    Preliminary report:  Right:  No evidence of DVT, superficial thrombosis, or Baker's cyst.  Jebidiah Baggerly, RVS 07/03/2015, 9:10 PM

## 2015-07-03 NOTE — Discharge Instructions (Signed)
Follow-up with the [employer] doctors as planned.
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
## 2015-07-08 ENCOUNTER — Other Ambulatory Visit: Payer: Self-pay | Admitting: Family Medicine

## 2015-07-09 ENCOUNTER — Other Ambulatory Visit: Payer: Self-pay | Admitting: *Deleted

## 2015-07-09 MED ORDER — MELOXICAM 15 MG PO TABS: 15.0000 mg | ORAL_TABLET | Freq: Every day | ORAL | Status: DC

## 2015-07-10 NOTE — Telephone Encounter (Signed)
Cancelled last appointment.  Told w/ last refill that needed appt for more refills, have already provided bridge RF last month.  Has not been seen in >2 years.  Refill denied.  Please let patient know.

## 2015-07-11 NOTE — Telephone Encounter (Signed)
Patient has an appt on 07/12/15. Jazmin Hartsell,CMA

## 2015-07-12 ENCOUNTER — Ambulatory Visit (INDEPENDENT_AMBULATORY_CARE_PROVIDER_SITE_OTHER): Payer: Medicare Other | Admitting: Family Medicine

## 2015-07-12 ENCOUNTER — Encounter: Payer: Self-pay | Admitting: Family Medicine

## 2015-07-12 ENCOUNTER — Other Ambulatory Visit: Payer: Self-pay | Admitting: Family Medicine

## 2015-07-12 VITALS — BP 132/95 | HR 57 | Temp 98.1°F | Ht 67.0 in | Wt 187.0 lb

## 2015-07-12 DIAGNOSIS — Z91018 Allergy to other foods: Secondary | ICD-10-CM

## 2015-07-12 DIAGNOSIS — K219 Gastro-esophageal reflux disease without esophagitis: Secondary | ICD-10-CM

## 2015-07-12 DIAGNOSIS — M2170 Unequal limb length (acquired), unspecified site: Secondary | ICD-10-CM

## 2015-07-12 DIAGNOSIS — R0981 Nasal congestion: Secondary | ICD-10-CM | POA: Diagnosis not present

## 2015-07-12 DIAGNOSIS — S76311D Strain of muscle, fascia and tendon of the posterior muscle group at thigh level, right thigh, subsequent encounter: Secondary | ICD-10-CM

## 2015-07-12 DIAGNOSIS — I10 Essential (primary) hypertension: Secondary | ICD-10-CM | POA: Diagnosis not present

## 2015-07-12 LAB — COMPLETE METABOLIC PANEL WITH GFR
ALBUMIN: 4.3 g/dL (ref 3.6–5.1)
ALT: 20 U/L (ref 9–46)
AST: 17 U/L (ref 10–35)
Alkaline Phosphatase: 77 U/L (ref 40–115)
BUN: 20 mg/dL (ref 7–25)
CO2: 23 mmol/L (ref 20–31)
Calcium: 9.3 mg/dL (ref 8.6–10.3)
Chloride: 104 mmol/L (ref 98–110)
Creat: 1.32 mg/dL (ref 0.70–1.33)
GFR, Est African American: 69 mL/min (ref >=60)
GFR, Est Non African American: 60 mL/min (ref >=60)
GLUCOSE: 80 mg/dL (ref 65–99)
POTASSIUM: 4.3 mmol/L (ref 3.5–5.3)
SODIUM: 138 mmol/L (ref 135–146)
TOTAL PROTEIN: 7.2 g/dL (ref 6.1–8.1)
Total Bilirubin: 0.3 mg/dL (ref 0.2–1.2)

## 2015-07-12 LAB — LIPID PANEL
CHOL/HDL RATIO: 3.5 ratio (ref <=5.0)
Cholesterol: 173 mg/dL (ref 125–200)
HDL: 50 mg/dL (ref >=40)
LDL CALC: 107 mg/dL (ref <130)
Triglycerides: 80 mg/dL (ref <150)
VLDL: 16 mg/dL (ref <30)

## 2015-07-12 MED ORDER — EPINEPHRINE 0.3 MG/0.3ML IJ SOAJ: 0.3000 mg | Freq: Once | INTRAMUSCULAR | Status: DC

## 2015-07-12 MED ORDER — DEXLANSOPRAZOLE 60 MG PO CPDR: 60.0000 mg | DELAYED_RELEASE_CAPSULE | Freq: Every day | ORAL | Status: DC

## 2015-07-12 MED ORDER — TOPIRAMATE 200 MG PO TABS: 200.0000 mg | ORAL_TABLET | Freq: Two times a day (BID) | ORAL | Status: DC

## 2015-07-12 MED ORDER — RIZATRIPTAN BENZOATE 5 MG PO TBDP: 5.0000 mg | ORAL_TABLET | ORAL | Status: DC | PRN

## 2015-07-12 MED ORDER — CETIRIZINE HCL 10 MG PO TABS: 10.0000 mg | ORAL_TABLET | Freq: Every day | ORAL | Status: DC

## 2015-07-12 MED ORDER — OLANZAPINE 2.5 MG PO TABS: ORAL_TABLET | ORAL | Status: DC

## 2015-07-12 MED ORDER — FLUOXETINE HCL 20 MG PO CAPS: ORAL_CAPSULE | ORAL | Status: DC

## 2015-07-12 MED ORDER — FLUTICASONE PROPIONATE 50 MCG/ACT NA SUSP: 2.0000 | Freq: Every day | NASAL | Status: DC

## 2015-07-12 MED ORDER — TRAZODONE HCL 50 MG PO TABS: 50.0000 mg | ORAL_TABLET | Freq: Every day | ORAL | Status: DC

## 2015-07-12 MED ORDER — LIDOCAINE 5 % EX PTCH: 1.0000 | MEDICATED_PATCH | CUTANEOUS | Status: DC

## 2015-07-12 NOTE — Patient Instructions (Addendum)
It was a pleasure meeting you today. Please make sure to schedule an annual exam with me in the next couple of months. I will contact you will the results of your labs.  If anything is abnormal, I will call you.  Otherwise, expect a copy to be mailed to you.  Get your colonoscopy done.
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
## 2015-07-13 ENCOUNTER — Telehealth: Payer: Self-pay | Admitting: *Deleted

## 2015-07-13 ENCOUNTER — Encounter: Payer: Self-pay | Admitting: Family Medicine

## 2015-07-13 NOTE — Telephone Encounter (Signed)
Prior Authorization received from [facility] for Lidocaine 5% patch.  PA form placed in provider box for completion. Ayrton Mcvay L, RN  

## 2015-07-17 NOTE — Telephone Encounter (Signed)
Done and placed in Tamika's Box.

## 2015-07-17 NOTE — Telephone Encounter (Signed)
PA form faxed to [facility] for review.  Review process could take 24-72 hours to complete.  Martin, Tamika L, RN  

## 2015-07-17 NOTE — Telephone Encounter (Signed)
PA for Lidocaine 5% patch denied via [facility].  Reference number: [id number].  Derl Barrow, RN

## 2015-08-10 ENCOUNTER — Other Ambulatory Visit: Payer: Self-pay | Admitting: *Deleted

## 2015-08-10 DIAGNOSIS — M2170 Unequal limb length (acquired), unspecified site: Secondary | ICD-10-CM

## 2015-08-10 DIAGNOSIS — S76311D Strain of muscle, fascia and tendon of the posterior muscle group at thigh level, right thigh, subsequent encounter: Secondary | ICD-10-CM

## 2015-08-10 NOTE — Telephone Encounter (Signed)
Patient needs to be seen if needing muscle relaxer.  This medication was prescribed over 2 years ago.  He is due for annual exam, which he noted that he would schedule at last visit, though I do not see an appointment.  Please call to relay information.

## 2015-08-13 NOTE — Telephone Encounter (Signed)
LVM for pt to call back to inform him of below. Zimmerman Rumple, Ellah Otte D, CMA  

## 2015-08-29 ENCOUNTER — Other Ambulatory Visit: Payer: Self-pay | Admitting: Orthopedic Surgery

## 2015-08-29 DIAGNOSIS — M25561 Pain in right knee: Secondary | ICD-10-CM

## 2015-09-08 ENCOUNTER — Ambulatory Visit
Admission: RE | Admit: 2015-09-08 | Discharge: 2015-09-08 | Disposition: A | Discharge status: Home / Self Care | Payer: Worker's Compensation | Source: Ambulatory Visit | Attending: Orthopedic Surgery | Admitting: Orthopedic Surgery

## 2015-09-08 DIAGNOSIS — M25561 Pain in right knee: Secondary | ICD-10-CM

## 2015-09-12 ENCOUNTER — Ambulatory Visit (INDEPENDENT_AMBULATORY_CARE_PROVIDER_SITE_OTHER): Payer: Medicare Other | Admitting: Student

## 2015-09-12 DIAGNOSIS — R05 Cough: Secondary | ICD-10-CM | POA: Diagnosis not present

## 2015-09-12 DIAGNOSIS — Z114 Encounter for screening for human immunodeficiency virus [HIV]: Secondary | ICD-10-CM | POA: Diagnosis not present

## 2015-09-12 DIAGNOSIS — R413 Other amnesia: Secondary | ICD-10-CM | POA: Diagnosis not present

## 2015-09-12 DIAGNOSIS — R269 Unspecified abnormalities of gait and mobility: Secondary | ICD-10-CM | POA: Diagnosis not present

## 2015-09-12 DIAGNOSIS — Z5329 Procedure and treatment not carried out because of patient's decision for other reasons: Secondary | ICD-10-CM | POA: Diagnosis present

## 2015-09-12 DIAGNOSIS — L299 Pruritus, unspecified: Secondary | ICD-10-CM

## 2015-09-12 MED ORDER — LEVOCETIRIZINE DIHYDROCHLORIDE 5 MG PO TABS
5.0000 mg | ORAL_TABLET | Freq: Every evening | ORAL | 5 refills | Status: DC
Start: 2015-09-12 — End: 2016-02-26

## 2015-09-12 NOTE — Patient Instructions (Signed)
Follow-up with PCP in 2 weeks for ear itching Take Xyzal daily for allergies If you questions or concerns call the office at [phone]

## 2015-09-12 NOTE — Progress Notes (Signed)
   Subjective:    Patient ID: Gregory [name], male    DOB: 1960-07-31, 55 y.o.   MRN: [id number]   CC: Ear itching and occasional pain  HPI: 55 year old male presenting for ear itching and occasional pain  Ear Itching/pain - Has been present for the last year - Noted most when he is outside and at night -At night his wife likes to have room to be cool and keeps the window open with air-conditioning going  - He 'digs in' his ears when he has itching  -  he has occasional pain in his ears which she does not associate with taking in them  - He wonders whether he has wax buildup  - Denies dizziness, sensation of room spinning, difficulty hearing   -he does have allergies to multiple environmental factors  - denies throat itching, itching of other areas, rashes, shortness of breath  - he does report night time stuffiness, and runny nose - he no longer takes flonase or cetirize which were previously prescribed for allergies    Smoking status reviewed  Review of Systems  Per HPI, else denies recent illness, fever, headache, changes in vision, chest pain, shortness of breath, abdominal pain, N/V/D, weakness    Objective:  BP 130/90 (BP Location: Left Arm, Patient Position: Sitting, Cuff Size: Normal)   Pulse 70   Temp 98.1 F (36.7 C) (Oral)   Wt 186 lb (84.4 kg)   SpO2 100%   BMI 29.13 kg/m  Vitals and nursing note reviewed  General: NAD HEENT: Bilaterally normal ear canals, TMs, no evidence of erythema or lesions. Mild cerumen in right ear canal but non-obstructive, minimal cerumen in left ear Cardiac: RRR,  Respiratory: CTAB, normal effort Skin: warm and dry, no rashes noted Neuro: alert and oriented,   Assessment & Plan:    Ear itching Previously documented issue related to Q-tip use. Today no evidence of erythema, lesions and ears, infection. Potentially symptoms are related to allergies and not taking allergy medications.  -Patient encouraged to not put anything  in ears - Xyzal prescribed. Patient declined Flonase - Will follow with PCP     Charm Stenner A. Lincoln Brigham MD, [facility] Resident PGY-3 Pager [phone]

## 2015-09-12 NOTE — Assessment & Plan Note (Signed)
Previously documented issue related to Q-tip use. Today no evidence of erythema, lesions and ears, infection. Potentially symptoms are related to allergies and not taking allergy medications.  -Patient encouraged to not put anything in ears - Xyzal prescribed. Patient declined Flonase - Will follow with PCP

## 2015-09-21 ENCOUNTER — Ambulatory Visit (INDEPENDENT_AMBULATORY_CARE_PROVIDER_SITE_OTHER): Payer: Medicare Other | Admitting: Family Medicine

## 2015-09-21 DIAGNOSIS — R269 Unspecified abnormalities of gait and mobility: Secondary | ICD-10-CM | POA: Diagnosis not present

## 2015-09-21 DIAGNOSIS — R413 Other amnesia: Secondary | ICD-10-CM | POA: Diagnosis not present

## 2015-09-21 DIAGNOSIS — S76301A Unspecified injury of muscle, fascia and tendon of the posterior muscle group at thigh level, right thigh, initial encounter: Secondary | ICD-10-CM

## 2015-09-21 DIAGNOSIS — Z114 Encounter for screening for human immunodeficiency virus [HIV]: Secondary | ICD-10-CM | POA: Diagnosis not present

## 2015-09-21 DIAGNOSIS — R05 Cough: Secondary | ICD-10-CM | POA: Diagnosis not present

## 2015-09-21 DIAGNOSIS — Z5329 Procedure and treatment not carried out because of patient's decision for other reasons: Secondary | ICD-10-CM | POA: Diagnosis present
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
## 2015-09-24 DIAGNOSIS — S76301A Unspecified injury of muscle, fascia and tendon of the posterior muscle group at thigh level, right thigh, initial encounter: Secondary | ICD-10-CM | POA: Insufficient documentation

## 2015-10-05 ENCOUNTER — Ambulatory Visit (INDEPENDENT_AMBULATORY_CARE_PROVIDER_SITE_OTHER): Payer: Medicare Other | Admitting: Family Medicine

## 2015-10-05 ENCOUNTER — Encounter: Payer: Self-pay | Admitting: Family Medicine

## 2015-10-05 DIAGNOSIS — R05 Cough: Secondary | ICD-10-CM | POA: Diagnosis not present

## 2015-10-05 DIAGNOSIS — Z5329 Procedure and treatment not carried out because of patient's decision for other reasons: Secondary | ICD-10-CM | POA: Diagnosis not present

## 2015-10-05 DIAGNOSIS — R269 Unspecified abnormalities of gait and mobility: Secondary | ICD-10-CM | POA: Diagnosis not present

## 2015-10-05 DIAGNOSIS — S76301D Unspecified injury of muscle, fascia and tendon of the posterior muscle group at thigh level, right thigh, subsequent encounter: Secondary | ICD-10-CM

## 2015-10-05 DIAGNOSIS — Z114 Encounter for screening for human immunodeficiency virus [HIV]: Secondary | ICD-10-CM | POA: Diagnosis not present

## 2015-10-05 DIAGNOSIS — R413 Other amnesia: Secondary | ICD-10-CM | POA: Diagnosis not present

## 2015-10-05 NOTE — Patient Instructions (Addendum)
  Hamstring curls: Start with 3 sets of 15 (no weight); Progress by 5 reps every 3 days until you reach 3 sets of 30; After 3 days at 3 sets of 30, add 2lb ankle weight at 3 sets of 10; Increase every 5 days by 5 reps. You may add 2lbs ankle weight once weekly. Hamstring swings- swing leg backwards and curl at the end of the swing. Follow same schedule as above. Hamstring running lunges- running lunge position means no more than 45 degrees of knee flexion and running motion. Follow same schedule as above.  ADVANCED PROGRAM:  Plyometrics: bound and land in running position; 3 sets of 15 level first; then up a rise; then down a rise; only advance when level is easy. No greater than 45 deg knee flexion. Stride drills- 10 x 75 yards; bounding but no more than 45 deg knee flexion. Level for 1 week; then add up hill for 1 week; then add down hill. Hop drills- 15 repetitive hops- 3 sets; compare injured to non injured leg Nordic hamstring exercises- stabilize body kneeling;1 set of 5-10 and progress slowly(Don't attempt if painful)

## 2015-10-05 NOTE — Progress Notes (Signed)
    CHIEF COMPLAINT / HPI:   Right hamstring pain. I had written him out of work while we try to sort out what he was planning to do with his leg injury. 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 has decided to try to go back to work full-time. He thinks he can do this. His pain is still there, but improved since I saw him last. He's been doing more walking. He has restarted some the physical therapy exercises that he had done previously. He does have some questions about whether or not a knee brace would help him on the right and if there some type of brace that would support his hamstring area.  REVIEW OF SYSTEMS:  No unusual weight change, fever, sweats, chills. No lower extremity numbness.  OBJECTIVE:  Vital signs are reviewed.   GEN.: Well-developed male no acute distress HIPS: Internal/external rotation is full and painless. Right hamstring origin area still tender to palpation but improved from last visit. Still has a defect noted in the proximal posterior thigh. Hamstring strength on the right is 3-4 out of 5 compared with left which is 5 out of 5. His gait is still antalgic.  ASSESSMENT / PLAN: Please see problem oriented charting for details

## 2015-10-05 NOTE — Assessment & Plan Note (Signed)
He's had some improvement in his pain in function since last visit with mild exercise program. I gave him a handout and rehabilitation program for complete hamstring rehabilitation today starting with the general program and proceeding been with the advanced program. My nurse went through the exercises with him in detail. I will release him at his request for full return to work and have given him a letter stating that. I'll be happy see him back at any time in the future he can follow-up when necessary.

## 2015-10-08 ENCOUNTER — Ambulatory Visit (INDEPENDENT_AMBULATORY_CARE_PROVIDER_SITE_OTHER): Payer: Medicare Other | Admitting: Family Medicine

## 2015-10-08 ENCOUNTER — Encounter: Payer: Self-pay | Admitting: Family Medicine

## 2015-10-08 VITALS — BP 131/87 | HR 72 | Temp 98.2°F | Wt 186.0 lb

## 2015-10-08 DIAGNOSIS — Z5329 Procedure and treatment not carried out because of patient's decision for other reasons: Secondary | ICD-10-CM | POA: Diagnosis not present

## 2015-10-08 DIAGNOSIS — R51 Headache: Secondary | ICD-10-CM

## 2015-10-08 DIAGNOSIS — R269 Unspecified abnormalities of gait and mobility: Secondary | ICD-10-CM | POA: Diagnosis not present

## 2015-10-08 DIAGNOSIS — G43009 Migraine without aura, not intractable, without status migrainosus: Secondary | ICD-10-CM

## 2015-10-08 DIAGNOSIS — E785 Hyperlipidemia, unspecified: Secondary | ICD-10-CM

## 2015-10-08 DIAGNOSIS — R05 Cough: Secondary | ICD-10-CM | POA: Diagnosis not present

## 2015-10-08 DIAGNOSIS — G4733 Obstructive sleep apnea (adult) (pediatric): Secondary | ICD-10-CM

## 2015-10-08 DIAGNOSIS — R519 Headache, unspecified: Secondary | ICD-10-CM | POA: Insufficient documentation

## 2015-10-08 DIAGNOSIS — R413 Other amnesia: Secondary | ICD-10-CM | POA: Diagnosis not present

## 2015-10-08 DIAGNOSIS — Z114 Encounter for screening for human immunodeficiency virus [HIV]: Secondary | ICD-10-CM | POA: Diagnosis not present

## 2015-10-08 MED ORDER — ATORVASTATIN CALCIUM 40 MG PO TABS: 40.0000 mg | ORAL_TABLET | Freq: Every day | ORAL | 3 refills | Status: DC

## 2015-10-08 NOTE — Assessment & Plan Note (Signed)
Patient's headaches do not sound convincing for migraine headaches at this time but patient has a h/o of migraine which had been controlled on Topamax.  He unfortunately, has not been taking because the Rx at home showed it was expired but did have refills on the rx provided to him in 06/2015.  Patient to pick this medication up.  If no improvement in headache or red flag symptoms patient to seek medical attention.  No focal neurologic deficits on today's exam.

## 2015-10-08 NOTE — Assessment & Plan Note (Addendum)
His coughing spells sound more consistent with OSA.  He had a fairly normal ENT exam and is on reflux medications, so doubt allergy or reflux as cause.  Patient previously using a CPAP but has not used in several years.  Will plan for repeat sleep study (split night) and re-rx CPAP machine.  I think that this will help with his headaches as well.

## 2015-10-08 NOTE — Progress Notes (Signed)
    Subjective: CC: headache HPI: Gregory [name] is a 55 y.o. male presenting to clinic today for office visit. Concerns today include:  1. Headaches Patient reports that he used to suffer from headache a lot but they subsided.  He notes that over the last 2 weeks headache.  Headaches are are described as a pounding, throbbing sensation behind his left eye.  Notes that he is having headache almost every day.  Headaches last for hours.  He notes that Topamax expired.  He didn't realize he had refills.  He notes that he wears glasses but that he has not been wearing them lately.  He notes that everything he eats tastes sweet.  This is typical of how his headache were before.  2. Night time cough Patient reports that he has had a night time cough for a long time.  He reports compliance with antacid.  He took Zyrtec for a while but stopped because it was not helping.  His wife notes that he was dx with OSA in the past.  His insurance stopped covering the CPAP so he has not used in several years.  Social History Reviewed. FamHx and MedHx reviewed.  Please see EMR. Health Maintenance: colonscopy  ROS: Per HPI  Objective: Office vital signs reviewed. BP 131/87   Pulse 72   Temp 98.2 F (36.8 C) (Oral)   Wt 186 lb (84.4 kg)   SpO2 96%   BMI 29.13 kg/m   Physical Examination:  General: Awake, alert, well nourished, No acute distress HEENT: Normal    Neck: No masses palpated. No lymphadenopathy    Ears: Tympanic membranes intact, normal light reflex, no erythema, no bulging    Eyes: PERRLA, EOMI    Nose: nasal turbinates moist    Throat: moist mucus membranes, no erythema Cardio: regular rate and rhythm, S1S2 heard, no murmurs appreciated Pulm: clear to auscultation bilaterally, no wheezes, rhonchi or rales Neuro: Strength and sensation grossly intact, CN 2-12 grossly in tact  Assessment/ Plan: 55 y.o. male   Migraine Patient's headaches do not sound convincing for migraine  headaches at this time but patient has a h/o of migraine which had been controlled on Topamax.  He unfortunately, has not been taking because the Rx at home showed it was expired but did have refills on the rx provided to him in 06/2015.  Patient to pick this medication up.  If no improvement in headache or red flag symptoms patient to seek medical attention.  No focal neurologic deficits on today's exam.  HLD (hyperlipidemia) ASCVD risk ~12%.  lipitor 40mg  started today.  Patient already taking ASA 81mg .  Will plan to check a direct LDL at next appt or in 3 months.  Obstructive sleep apnea His coughing spells sound more consistent with OSA.  He had a fairly normal ENT exam and is on reflux medications, so doubt allergy or reflux as cause.  Patient previously using a CPAP but has not used in several years.  Will plan for repeat sleep study (split night) and re-rx CPAP machine.  I think that this will help with his headaches as well.   Janora Norlander, DO PGY-3, [facility]

## 2015-10-08 NOTE — Assessment & Plan Note (Signed)
ASCVD risk ~12%.  lipitor 40mg  started today.  Patient already taking ASA 81mg .  Will plan to check a direct LDL at next appt or in 3 months.

## 2015-10-15 ENCOUNTER — Other Ambulatory Visit: Payer: Self-pay | Admitting: Family Medicine

## 2015-10-15 ENCOUNTER — Telehealth: Payer: Self-pay | Admitting: Family Medicine

## 2015-10-15 DIAGNOSIS — G43009 Migraine without aura, not intractable, without status migrainosus: Secondary | ICD-10-CM

## 2015-10-15 NOTE — Telephone Encounter (Signed)
Done

## 2015-10-15 NOTE — Telephone Encounter (Signed)
Wife calls to request a referral to [facility]. Please advise.

## 2015-10-17 NOTE — Telephone Encounter (Signed)
Pt informed and wife got on phone and stated that she has an appointment on 8/31 with Dr. Lajuana Ripple and wanted to know if she could fill out a form for pt and I told her she could ask but that she may not be able to do it at that visit, but that she could give it to her and then when complete we would contact her. Also she said the headache clinic was wanting to know about recent labs for pt and I told her we could print those for her if she needed them. Gregory [name], Gregory [name], [place]

## 2015-10-18 ENCOUNTER — Other Ambulatory Visit: Payer: Self-pay | Admitting: Family Medicine

## 2015-10-21 ENCOUNTER — Encounter (HOSPITAL_COMMUNITY): Payer: Self-pay

## 2015-10-21 ENCOUNTER — Emergency Department (HOSPITAL_COMMUNITY)
Admission: EM | Admit: 2015-10-21 | Discharge: 2015-10-21 | Disposition: A | Discharge status: Home / Self Care | Payer: Medicare Other | Attending: Emergency Medicine | Admitting: Emergency Medicine

## 2015-10-21 DIAGNOSIS — Z96652 Presence of left artificial knee joint: Secondary | ICD-10-CM | POA: Diagnosis not present

## 2015-10-21 DIAGNOSIS — Z79899 Other long term (current) drug therapy: Secondary | ICD-10-CM | POA: Diagnosis not present

## 2015-10-21 DIAGNOSIS — Z91010 Allergy to peanuts: Secondary | ICD-10-CM | POA: Insufficient documentation

## 2015-10-21 DIAGNOSIS — I10 Essential (primary) hypertension: Secondary | ICD-10-CM | POA: Insufficient documentation

## 2015-10-21 DIAGNOSIS — Z87891 Personal history of nicotine dependence: Secondary | ICD-10-CM | POA: Insufficient documentation

## 2015-10-21 DIAGNOSIS — L0231 Cutaneous abscess of buttock: Secondary | ICD-10-CM | POA: Insufficient documentation

## 2015-10-21 DIAGNOSIS — L0291 Cutaneous abscess, unspecified: Secondary | ICD-10-CM

## 2015-10-21 MED ORDER — SULFAMETHOXAZOLE-TRIMETHOPRIM 800-160 MG PO TABS: 1.0000 | ORAL_TABLET | Freq: Two times a day (BID) | ORAL | 0 refills | Status: AC

## 2015-10-21 MED ORDER — LIDOCAINE-EPINEPHRINE (PF) 2 %-1:200000 IJ SOLN
10.0000 mL | Freq: Once | INTRAMUSCULAR | Status: AC
  Administered 2015-10-21: 10 mL via INTRADERMAL
  Filled 2015-10-21: qty 20

## 2015-10-21 MED ORDER — TRAMADOL HCL 50 MG PO TABS
50.0000 mg | ORAL_TABLET | Freq: Four times a day (QID) | ORAL | 0 refills | Status: DC | PRN
Start: 2015-10-21 — End: 2016-02-26

## 2015-10-21 NOTE — ED Notes (Signed)
C/O ABSCESS ON LEFT BUTTOCK X 1 WEEK.
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
## 2015-10-21 NOTE — ED Triage Notes (Signed)
Patient complains of abscess to left buttock x 1 week, denies drainage

## 2015-10-21 NOTE — Discharge Instructions (Signed)
Remove the bandage after 24 hours. You must wait at least 8 hours after the wound repair to wash the wound. Clean the wound and surrounding area gently with tap water and mild soap. Rinse well and blot dry. Do not scrub the wound, as this may cause the wound edges to come apart. You may shower, but avoid submerging the wound, such as with a bath or swimming. Clean the wound daily to prevent reinfection. Reapplication of a topical antibiotic ointment, such as Neosporin, will decrease scab formation and reduce any scarring. You may use Tylenol, naproxen, ibuprofen for pain.  Return to the ED should signs of infection worsen.

## 2015-10-26 ENCOUNTER — Ambulatory Visit (HOSPITAL_BASED_OUTPATIENT_CLINIC_OR_DEPARTMENT_OTHER): Payer: Medicare Other | Attending: Family Medicine | Admitting: Internal Medicine

## 2015-10-26 DIAGNOSIS — R0683 Snoring: Secondary | ICD-10-CM | POA: Insufficient documentation

## 2015-10-26 DIAGNOSIS — G4733 Obstructive sleep apnea (adult) (pediatric): Secondary | ICD-10-CM | POA: Diagnosis not present

## 2015-11-09 ENCOUNTER — Ambulatory Visit: Payer: Self-pay | Admitting: Family Medicine

## 2015-11-09 DIAGNOSIS — G4733 Obstructive sleep apnea (adult) (pediatric): Secondary | ICD-10-CM | POA: Diagnosis not present

## 2015-11-09 NOTE — Procedures (Signed)
Patient Name: Gregory [name], Gregory [name] Date: 10/26/2015 Gender: Male D.O.B: 1960-10-24 Age (years): 67 Referring Provider: Andrena Mews Height (inches): 72 Interpreting Physician: Baird Lyons MD, ABSM Weight (lbs): 187 RPSGT: Earney [place] BMI: 29 MRN: [id number] Neck Size: 16.00 CLINICAL INFORMATION Sleep Study Type: Split Night CPAP Indication for sleep study: OSA Epworth Sleepiness Score: 5  SLEEP STUDY TECHNIQUE As per the AASM Manual for the Scoring of Sleep and Associated Events v2.3 (April 2016) with a hypopnea requiring 4% desaturations. The channels recorded and monitored were frontal, central and occipital EEG, electrooculogram (EOG), submentalis EMG (chin), nasal and oral airflow, thoracic and abdominal wall motion, anterior tibialis EMG, snore microphone, electrocardiogram, and pulse oximetry. Continuous positive airway pressure (CPAP) was initiated when the patient met split night criteria and was titrated according to treat sleep-disordered breathing.  MEDICATIONS Medications taken by the patient : charted for review Medications administered by patient during sleep study : No sleep medicine administered.  RESPIRATORY PARAMETERS Diagnostic Total AHI (/hr): 12.7 RDI (/hr): 12.7 OA Index (/hr): 0.4 CA Index (/hr): 0.0 REM AHI (/hr): N/A NREM AHI (/hr): 12.7 Supine AHI (/hr): 12.8 Non-supine AHI (/hr): 2.12 Min O2 Sat (%): 85.00 Mean O2 (%): 94.84 Time below 88% (min): 1.1   Titration Optimal Pressure (cm): 11 AHI at Optimal Pressure (/hr): 1.9 Min O2 at Optimal Pressure (%): 94.0 Supine % at Optimal (%): 100 Sleep % at Optimal (%): 91    SLEEP ARCHITECTURE The recording time for the entire night was 426.3 minutes. During a baseline period of 173.7 minutes, the patient slept for 155.6 minutes in REM and nonREM, yielding a sleep efficiency of 89.5%. Sleep onset after lights out was 3.7 minutes with a REM latency of N/A minutes. The patient spent 4.50% of the  night in stage N1 sleep, 95.50% in stage N2 sleep, 0.00% in stage N3 and 0.00% in REM. During the titration period of 244.9 minutes, the patient slept for 229.1 minutes in REM and nonREM, yielding a sleep efficiency of 93.6%. Sleep onset after CPAP initiation was 2.7 minutes with a REM latency of 104.0 minutes. The patient spent 1.53% of the night in stage N1 sleep, 77.96% in stage N2 sleep, 0.00% in stage N3 and 20.51% in REM.  CARDIAC DATA The 2 lead EKG demonstrated sinus rhythm. The mean heart rate was 57.22 beats per minute. Other EKG findings include: None.  LEG MOVEMENT DATA The total Periodic Limb Movements of Sleep (PLMS) were 0. The PLMS index was 0.00 .  IMPRESSIONS - Mild obstructive sleep apnea occurred during the diagnostic portion of the study (AHI = 12.7 /hour). An optimal PAP pressure was selected for this patient ( 11 cm of water) - No significant central sleep apnea occurred during the diagnostic portion of the study (CAI = 0.0/hour). - Mild oxygen desaturation was noted during the diagnostic portion of the study (Min O2 = 85.00%). - The patient snored with Loud snoring volume during the diagnostic portion of the study. - No cardiac abnormalities were noted during this study. - Clinically significant periodic limb movements did not occur during sleep.  DIAGNOSIS - Obstructive Sleep Apnea (327.23 [G47.33 ICD-10])  RECOMMENDATIONS - Trial of CPAP therapy on 11 cm H2O with a Medium size Fisher&Paykel Full Face Mask Simplus mask and heated humidification. - Avoid alcohol, sedatives and other CNS depressants that may worsen sleep apnea and disrupt normal sleep architecture. - Sleep hygiene should be reviewed to assess factors that may improve sleep quality. - Weight management and regular  exercise should be initiated or continued.  [Electronically signed] 11/09/2015 02:55 PM  Baird Lyons MD, [place], American Board of Sleep Medicine   NPI:  [id number]  [place], American Board of Sleep Medicine  ELECTRONICALLY SIGNED ON:  11/09/2015, 2:54 PM [employer] PH: [phone]   FX: [phone] [place]

## 2015-11-16 ENCOUNTER — Encounter: Payer: Self-pay | Admitting: Family Medicine

## 2015-11-16 ENCOUNTER — Other Ambulatory Visit: Payer: Self-pay | Admitting: *Deleted

## 2015-11-19 MED ORDER — MELOXICAM 15 MG PO TABS: 15.0000 mg | ORAL_TABLET | Freq: Every day | ORAL | 0 refills | Status: DC

## 2015-11-23 ENCOUNTER — Other Ambulatory Visit: Payer: Self-pay | Admitting: Family Medicine

## 2015-11-23 DIAGNOSIS — G4733 Obstructive sleep apnea (adult) (pediatric): Secondary | ICD-10-CM

## 2015-12-07 DIAGNOSIS — Z23 Encounter for immunization: Secondary | ICD-10-CM | POA: Diagnosis not present

## 2016-02-01 ENCOUNTER — Ambulatory Visit (INDEPENDENT_AMBULATORY_CARE_PROVIDER_SITE_OTHER): Payer: Medicare Other | Admitting: Family Medicine

## 2016-02-01 ENCOUNTER — Telehealth: Payer: Self-pay | Admitting: *Deleted

## 2016-02-01 ENCOUNTER — Encounter: Payer: Self-pay | Admitting: Family Medicine

## 2016-02-01 VITALS — BP 126/90 | HR 72 | Temp 98.1°F | Ht 67.0 in | Wt 189.8 lb

## 2016-02-01 DIAGNOSIS — R110 Nausea: Secondary | ICD-10-CM

## 2016-02-01 DIAGNOSIS — Z8711 Personal history of peptic ulcer disease: Secondary | ICD-10-CM

## 2016-02-01 DIAGNOSIS — R6881 Early satiety: Secondary | ICD-10-CM

## 2016-02-01 DIAGNOSIS — Z8719 Personal history of other diseases of the digestive system: Secondary | ICD-10-CM

## 2016-02-01 MED ORDER — ONDANSETRON HCL 4 MG PO TABS: 4.0000 mg | ORAL_TABLET | Freq: Three times a day (TID) | ORAL | 0 refills | Status: DC | PRN

## 2016-02-01 NOTE — Patient Instructions (Signed)
STOP Meloxicam.  I have referred you to GI (Dr Benson [place]).  If you do not receive a call in the next week, call me.

## 2016-02-01 NOTE — Telephone Encounter (Signed)
Prior Authorization received from [facility] pharmacy for Ondansetron. PA completed online at [email or web address]. PA could take 24-72 hours to complete.  Derl Barrow, RN

## 2016-02-01 NOTE — Progress Notes (Signed)
   Subjective: CC: stomach issue [id number] [name] is a 55 y.o. male presenting to clinic today for same day appointment. PCP: Gregory Doss, DO Concerns today include:  1. Stomach issues Patient reports that he has been feeling nauseated while eating.  He is unable to finish meals.  Denies vomiting, hematochezia, melena, abdominal pain.  He reports early satiety.  This has been going on for 2 months.  He reports having a stomach ulcer in 2003. Saw Dr Gregory [name] for EGD at that time.  He takes Dexilant daily.  He does take a daily Mobic.  He has been on this for 4 months.  He has never had a colonoscopy.  No weight loss, fevers, chills.  Reports intermittent loose stools x1 month.  Allergies  Allergen Reactions  . Peanut-Containing Drug Products Anaphylaxis  . Demerol [Meperidine] Rash  . Hydromorphone Hcl Itching and Rash  . Meperidine Hcl Itching and Rash    delusional  . Morphine Itching and Rash   Social History Reviewed: former smoker. FamHx and MedHx reviewed.  Please see EMR.  Health Maintenance: colonoscopy Flu Vaccine needed: yes, patient declined   ROS: Per HPI  Objective: Office vital signs reviewed. BP 126/90   Pulse 72   Temp 98.1 F (36.7 C) (Oral)   Ht 5\' 7"  (1.702 m)   Wt 189 lb 12.8 oz (86.1 kg)   SpO2 95%   BMI 29.73 kg/m   Physical Examination:  General: Awake, alert, well nourished, No acute distress HEENT: normal, sclera white, EOMI Cardio: regular rate  Pulm: normal work of breathing on room air GI: soft, + mild epigastric TTP, non-distended, bowel sounds present x4, no hepatomegaly, no splenomegaly, no masses; no peritoneal signs.  Assessment/ Plan: 55 y.o. male   1. Early satiety.  Weight stable from previous.  Concern that he may have a gastric ulcer.  He reports a history of this.  Though last EGD (2011) reviewed and only hiatal hernia noted.  Biopsies were obtained at that time and did not demonstrate any premalignancy/ eosinophilia, only a few  small changes consistent with GERD.  He is a former smoker.  Taking NSAID and PPI.  Has not yet had colonoscopy.  Cannot r/o malignant processes at this time, though no B symptoms.  Doubt mesenteric ischemia.  Could also be constipation with overflow loose stools. - Ambulatory referral to Gastroenterology, likely needs EGD and certainly needs colonoscopy. - Patient to discontinue use of Mobic. - Continue PPI - Return precautions reviewed.  2. Nausea without vomiting - ondansetron (ZOFRAN) 4 MG tablet; Take 1 tablet (4 mg total) by mouth every 8 (eight) hours as needed for nausea or vomiting.  Dispense: 20 tablet; Refill: 0 - Ambulatory referral to Gastroenterology  3. History of stomach ulcers - Ambulatory referral to Gastroenterology  Patient to follow up as needed.   Gregory Norlander, DO PGY-3, [facility]

## 2016-02-04 ENCOUNTER — Other Ambulatory Visit: Payer: Self-pay | Admitting: Family Medicine

## 2016-02-04 MED ORDER — PROMETHAZINE HCL 25 MG PO TABS: 25.0000 mg | ORAL_TABLET | Freq: Three times a day (TID) | ORAL | 0 refills | Status: DC | PRN

## 2016-02-04 NOTE — Telephone Encounter (Signed)
PA for ondansetron 4 mg was denied via [employer].  Denial form placed in provider box for review.  Derl Barrow, RN

## 2016-02-04 NOTE — Progress Notes (Signed)
Zofran not covered by insurance.  Changed medication to Promethazine.  Ashly M. Lajuana Ripple, DO PGY-3, [facility]

## 2016-02-05 ENCOUNTER — Encounter: Payer: Self-pay | Admitting: Internal Medicine

## 2016-02-05 ENCOUNTER — Ambulatory Visit (INDEPENDENT_AMBULATORY_CARE_PROVIDER_SITE_OTHER): Payer: Medicare Other | Admitting: Internal Medicine

## 2016-02-05 VITALS — BP 138/90 | HR 88 | Ht 67.0 in | Wt 189.1 lb

## 2016-02-05 DIAGNOSIS — R1310 Dysphagia, unspecified: Secondary | ICD-10-CM

## 2016-02-05 DIAGNOSIS — R109 Unspecified abdominal pain: Secondary | ICD-10-CM | POA: Diagnosis not present

## 2016-02-05 DIAGNOSIS — Z1211 Encounter for screening for malignant neoplasm of colon: Secondary | ICD-10-CM

## 2016-02-05 DIAGNOSIS — R112 Nausea with vomiting, unspecified: Secondary | ICD-10-CM | POA: Diagnosis not present

## 2016-02-05 DIAGNOSIS — R197 Diarrhea, unspecified: Secondary | ICD-10-CM

## 2016-02-05 MED ORDER — NA SULFATE-K SULFATE-MG SULF 17.5-3.13-1.6 GM/177ML PO SOLN: 1.0000 | Freq: Once | ORAL | 0 refills | Status: AC
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
## 2016-02-05 NOTE — Patient Instructions (Signed)
You have been scheduled for an abdominal ultrasound at [facility] (1st floor of hospital) on 02/07/2016 at 8:30am. Please arrive 15 minutes prior to your appointment for registration. Make certain not to have anything to eat or after midnight prior to your appointment. Should you need to reschedule your appointment, please contact radiology at [phone]. This test typically takes about 30 minutes to perform.  You have been scheduled for an endoscopy and colonoscopy. Please follow the written instructions given to you at your visit today. Please pick up your prep supplies at the pharmacy within the next 1-3 days. If you use inhalers (even only as needed), please bring them with you on the day of your procedure.

## 2016-02-07 ENCOUNTER — Ambulatory Visit (HOSPITAL_COMMUNITY)
Admission: RE | Admit: 2016-02-07 | Discharge: 2016-02-07 | Disposition: A | Discharge status: Home / Self Care | Payer: Medicare Other | Source: Ambulatory Visit | Attending: Internal Medicine | Admitting: Internal Medicine

## 2016-02-07 DIAGNOSIS — R109 Unspecified abdominal pain: Secondary | ICD-10-CM | POA: Diagnosis not present

## 2016-02-07 DIAGNOSIS — R112 Nausea with vomiting, unspecified: Secondary | ICD-10-CM | POA: Insufficient documentation

## 2016-02-07 DIAGNOSIS — R197 Diarrhea, unspecified: Secondary | ICD-10-CM | POA: Diagnosis not present

## 2016-02-07 DIAGNOSIS — Z1211 Encounter for screening for malignant neoplasm of colon: Secondary | ICD-10-CM

## 2016-02-07 DIAGNOSIS — R1310 Dysphagia, unspecified: Secondary | ICD-10-CM | POA: Diagnosis not present

## 2016-02-13 ENCOUNTER — Other Ambulatory Visit: Payer: Self-pay | Admitting: *Deleted

## 2016-02-13 NOTE — Telephone Encounter (Signed)
Needs office visit for controlled substances.

## 2016-02-21 NOTE — Telephone Encounter (Signed)
Left detailed message on patient voicemail about needing an appointment to have his tramadol refilled. Told patient to call office to schedule appointment. If patient does call back please schedule this for him. Thanks. Nat Christen, CMA D

## 2016-02-26 ENCOUNTER — Encounter: Payer: Self-pay | Admitting: Family Medicine

## 2016-02-26 ENCOUNTER — Ambulatory Visit (INDEPENDENT_AMBULATORY_CARE_PROVIDER_SITE_OTHER): Payer: Medicare Other | Admitting: Family Medicine

## 2016-02-26 VITALS — BP 138/90 | HR 93 | Temp 98.1°F | Ht 67.0 in | Wt 187.6 lb

## 2016-02-26 DIAGNOSIS — R269 Unspecified abnormalities of gait and mobility: Secondary | ICD-10-CM | POA: Diagnosis not present

## 2016-02-26 DIAGNOSIS — Z5329 Procedure and treatment not carried out because of patient's decision for other reasons: Secondary | ICD-10-CM | POA: Diagnosis present

## 2016-02-26 DIAGNOSIS — F319 Bipolar disorder, unspecified: Secondary | ICD-10-CM

## 2016-02-26 DIAGNOSIS — K219 Gastro-esophageal reflux disease without esophagitis: Secondary | ICD-10-CM

## 2016-02-26 DIAGNOSIS — Z114 Encounter for screening for human immunodeficiency virus [HIV]: Secondary | ICD-10-CM | POA: Diagnosis not present

## 2016-02-26 DIAGNOSIS — E785 Hyperlipidemia, unspecified: Secondary | ICD-10-CM

## 2016-02-26 DIAGNOSIS — R413 Other amnesia: Secondary | ICD-10-CM | POA: Diagnosis not present

## 2016-02-26 DIAGNOSIS — R05 Cough: Secondary | ICD-10-CM | POA: Diagnosis not present

## 2016-02-26 MED ORDER — DEXLANSOPRAZOLE 60 MG PO CPDR: 60.0000 mg | DELAYED_RELEASE_CAPSULE | Freq: Every day | ORAL | 3 refills | Status: DC

## 2016-02-26 MED ORDER — TOPIRAMATE 200 MG PO TABS: 200.0000 mg | ORAL_TABLET | Freq: Two times a day (BID) | ORAL | 4 refills | Status: DC

## 2016-02-26 MED ORDER — FLUOXETINE HCL 20 MG PO CAPS: ORAL_CAPSULE | ORAL | 4 refills | Status: DC

## 2016-02-26 MED ORDER — TRAMADOL HCL 50 MG PO TABS: 50.0000 mg | ORAL_TABLET | Freq: Four times a day (QID) | ORAL | 0 refills | Status: DC | PRN

## 2016-02-26 MED ORDER — ATORVASTATIN CALCIUM 40 MG PO TABS: 40.0000 mg | ORAL_TABLET | Freq: Every day | ORAL | 3 refills | Status: DC

## 2016-02-26 MED ORDER — TRAZODONE HCL 50 MG PO TABS: 50.0000 mg | ORAL_TABLET | Freq: Every day | ORAL | 4 refills | Status: DC

## 2016-02-26 MED ORDER — OLANZAPINE 2.5 MG PO TABS: ORAL_TABLET | ORAL | 4 refills | Status: DC

## 2016-02-26 NOTE — Progress Notes (Signed)
    Subjective: CC: mood HPI: Gregory [name] is a 56 y.o. male presenting to clinic today for:  1. Mood  Doing well.  He is tolerating Prozac, Zyprexa, Trazodone well.  He is sleeping well.  He denies depression, anxiety.  He feels well supported by wife.  He notes that she just had Bariatric surgery and is expected to be dc'd tomorrow.  2. Pain Pain in abdomen, knees.  He is being seen by GI.  There is plan for colonoscopy and EGD soon.  No nausea, vomiting.  No hematochezia.  Uses Tramadol very sparingly.  Needs refills on tramadol and dexilant.  Social Hx reviewed. MedHx, medications and allergies reviewed.  Please see EMR.  ROS: Per HPI  Objective: Office vital signs reviewed. BP 138/90   Pulse 93   Temp 98.1 F (36.7 C) (Oral)   Ht 5\' 7"  (1.702 m)   Wt 187 lb 9.6 oz (85.1 kg)   SpO2 93%   BMI 29.38 kg/m   Physical Examination:  General: Awake, alert, well nourished, No acute distress Eyes: sclera white Throat: moist mucus membranes Cardio: regular rate and rhythm, S1S2 heard, no murmurs appreciated Pulm: clear to auscultation bilaterally, no wheezes, rhonchi or rales; normal work of breathing on room air GI: soft, non-tender, non-distended, bowel sounds present x4, no hepatomegaly, no splenomegaly, no masses MSK: normal gait and normal station, no midline spinal TTP. No joint line TTP or patellar TTP.  Depression screen [facility] 2/9 02/27/2016 02/26/2016 02/01/2016 10/05/2015 07/12/2015  Decreased Interest 0 0 0 0 0  Down, Depressed, Hopeless 0 0 0 0 0  PHQ - 2 Score 0 0 0 0 0  Altered sleeping 0 - - - -  Tired, decreased energy 0 - - - -  Change in appetite 0 - - - -  Feeling bad or failure about yourself  0 - - - -  Trouble concentrating 0 - - - -  Moving slowly or fidgety/restless 0 - - - -  Suicidal thoughts 0 - - - -  PHQ-9 Score 0 - - - -   GAD 7 : Generalized Anxiety Score 02/26/2016  Nervous, Anxious, on Edge 0  Control/stop worrying 0  Worry too much -  different things 0  Trouble relaxing 0  Restless 0  Easily annoyed or irritable 0  Afraid - awful might happen 0  Total GAD 7 Score 0  Anxiety Difficulty Not difficult at all     Assessment/ Plan: 56 y.o. male   1. Bipolar affective disorder, remission status unspecified (HCC) - Neg GAD-7, neg PHQ 9 - Continue current regimen - Warned of adverse reactions with concomitant use of antidepressants/ antipsychotics/ tramadol.  Patient aware and wishes to continue Tramadol prn  2. Gastroesophageal reflux disease, esophagitis presence not specified.  ?Ulcer as cause of pain.  Exam nonfocal. - Follow up with GI as scheduled for colonoscopy and EGD - dexlansoprazole (DEXILANT) 60 MG capsule; Take 1 capsule (60 mg total) by mouth daily.  Dispense: 90 capsule; Refill: 3  3. Hyperlipidemia, unspecified hyperlipidemia type - atorvastatin (LIPITOR) 40 MG tablet; Take 1 tablet (40 mg total) by mouth daily.  Dispense: 90 tablet; Refill: 3  Follow up prn  Janora Norlander, DO PGY-3, [place] [facility]

## 2016-03-11 ENCOUNTER — Telehealth: Payer: Self-pay | Admitting: Family Medicine

## 2016-03-11 ENCOUNTER — Other Ambulatory Visit: Payer: Self-pay | Admitting: Family Medicine

## 2016-03-11 MED ORDER — TRAMADOL HCL 50 MG PO TABS: 50.0000 mg | ORAL_TABLET | Freq: Three times a day (TID) | ORAL | 2 refills | Status: DC | PRN

## 2016-03-11 NOTE — Telephone Encounter (Signed)
This has been refilled x3 months.  Please have him follow up with me if still needing medication at that time.  Please call this in to his pharmacy.  EMR has been updated with SIG and refills.

## 2016-03-11 NOTE — Telephone Encounter (Signed)
Pt called because he only received 15 qty of his Tramadol. He usually gets a lot more and always gets 3 months worth. Can we call the patient and let him know what the status is. Blima Rich

## 2016-03-11 NOTE — Telephone Encounter (Signed)
Called in Rx to pharmacy. Katharina Caper, April D, [place]

## 2016-03-21 ENCOUNTER — Telehealth: Payer: Self-pay | Admitting: Internal Medicine

## 2016-03-21 NOTE — Telephone Encounter (Signed)
Spoke with patient's wife and told her that I could switch patient to [facility] prep but that he would need to come up to the office Monday and get re instructed.  She agreed they would come Monday morning.

## 2016-03-25 ENCOUNTER — Ambulatory Visit (AMBULATORY_SURGERY_CENTER): Payer: Medicare Other | Admitting: Internal Medicine

## 2016-03-25 ENCOUNTER — Encounter: Payer: Self-pay | Admitting: Internal Medicine

## 2016-03-25 VITALS — BP 156/93 | HR 56 | Temp 98.0°F | Resp 14 | Ht 67.0 in | Wt 189.0 lb

## 2016-03-25 DIAGNOSIS — D125 Benign neoplasm of sigmoid colon: Secondary | ICD-10-CM

## 2016-03-25 DIAGNOSIS — Z1212 Encounter for screening for malignant neoplasm of rectum: Secondary | ICD-10-CM

## 2016-03-25 DIAGNOSIS — F319 Bipolar disorder, unspecified: Secondary | ICD-10-CM | POA: Diagnosis not present

## 2016-03-25 DIAGNOSIS — G4733 Obstructive sleep apnea (adult) (pediatric): Secondary | ICD-10-CM | POA: Diagnosis not present

## 2016-03-25 DIAGNOSIS — K635 Polyp of colon: Secondary | ICD-10-CM

## 2016-03-25 DIAGNOSIS — Z1211 Encounter for screening for malignant neoplasm of colon: Secondary | ICD-10-CM | POA: Diagnosis not present

## 2016-03-25 DIAGNOSIS — I10 Essential (primary) hypertension: Secondary | ICD-10-CM | POA: Diagnosis not present

## 2016-03-25 DIAGNOSIS — R1310 Dysphagia, unspecified: Secondary | ICD-10-CM | POA: Diagnosis not present

## 2016-03-25 DIAGNOSIS — D129 Benign neoplasm of anus and anal canal: Secondary | ICD-10-CM

## 2016-03-25 DIAGNOSIS — D128 Benign neoplasm of rectum: Secondary | ICD-10-CM

## 2016-03-25 MED ORDER — SODIUM CHLORIDE 0.9 % IV SOLN: 500.0000 mL | INTRAVENOUS | Status: DC

## 2016-03-25 NOTE — Progress Notes (Signed)
TO PACU  Pt awake and alert. Report to RN 

## 2016-03-25 NOTE — Op Note (Signed)
[facility] Patient Name: Gregory [name] Procedure Date: 03/25/2016 3:29 PM MRN: [id number] Endoscopist: Docia Chuck. Henrene Pastor , MD Age: 56 Referring MD:  Date of Birth: Jul 30, 1960 Gender: Male Account #: [id number] Procedure:                Upper GI endoscopy Indications:              Dysphagia, Nausea Medicines:                Monitored Anesthesia Care Procedure:                Pre-Anesthesia Assessment:                           - Prior to the procedure, a History and Physical                            was performed, and patient medications and                            allergies were reviewed. The patient's tolerance of                            previous anesthesia was also reviewed. The risks                            and benefits of the procedure and the sedation                            options and risks were discussed with the patient.                            All questions were answered, and informed consent                            was obtained. Prior Anticoagulants: The patient has                            taken no previous anticoagulant or antiplatelet                            agents. ASA Grade Assessment: II - A patient with                            mild systemic disease. After reviewing the risks                            and benefits, the patient was deemed in                            satisfactory condition to undergo the procedure.                           After obtaining informed consent, the endoscope was  passed under direct vision. Throughout the                            procedure, the patient's blood pressure, pulse, and                            oxygen saturations were monitored continuously. The                            Model GIF-[id number]) scope was introduced                            through the mouth, and advanced to the second part                            of duodenum. The upper GI endoscopy was                             accomplished without difficulty. The patient                            tolerated the procedure well. Scope In: Scope Out: Findings:                 The esophagus was normal.                           The stomach was normal.                           The examined duodenum was normal.                           The cardia and gastric fundus were normal on                            retroflexion. Complications:            No immediate complications. Estimated Blood Loss:     Estimated blood loss: none. Impression:               - Normal esophagus.                           - Normal stomach.                           - Normal examined duodenum.                           - No specimens collected. Recommendation:           - Patient has a contact number available for                            emergencies. The signs and symptoms of potential  delayed complications were discussed with the                            patient. Return to normal activities tomorrow.                            Written discharge instructions were provided to the                            patient.                           - Resume previous diet.                           - Continue present medications.                           - Return to the care of your primary provider GI                            follow-up as needed Docia Chuck. Henrene Pastor, MD 03/25/2016 4:11:48 PM This report has been signed electronically.

## 2016-03-25 NOTE — Op Note (Signed)
[facility] Patient Name: Jamicah Sennett Procedure Date: 03/25/2016 3:31 PM MRN: [id number] Endoscopist: Docia Chuck. Henrene Pastor , MD Age: 56 Referring MD:  Date of Birth: 1960-12-17 Gender: Male Account #: [id number] Procedure:                Colonoscopy, with snare cold polypectomy X2 Indications:              Screening for colorectal malignant neoplasm Medicines:                Monitored Anesthesia Care Procedure:                Pre-Anesthesia Assessment:                           - Prior to the procedure, a History and Physical                            was performed, and patient medications and                            allergies were reviewed. The patient's tolerance of                            previous anesthesia was also reviewed. The risks                            and benefits of the procedure and the sedation                            options and risks were discussed with the patient.                            All questions were answered, and informed consent                            was obtained. Prior Anticoagulants: The patient has                            taken no previous anticoagulant or antiplatelet                            agents. ASA Grade Assessment: II - A patient with                            mild systemic disease. After reviewing the risks                            and benefits, the patient was deemed in                            satisfactory condition to undergo the procedure.                           After obtaining informed consent, the colonoscope  was passed under direct vision. Throughout the                            procedure, the patient's blood pressure, pulse, and                            oxygen saturations were monitored continuously. The                            Model CF-[id number]) scope was introduced                            through the anus and advanced to the the cecum,           identified by appendiceal orifice and ileocecal                            valve. The ileocecal valve, appendiceal orifice,                            and rectum were photographed. The quality of the                            bowel preparation was excellent. The colonoscopy                            was performed without difficulty. The patient                            tolerated the procedure well. The bowel preparation                            used was SUPREP. Scope In: 3:43:47 PM Scope Out: 4:00:29 PM Scope Withdrawal Time: 0 hours 14 minutes 19 seconds  Total Procedure Duration: 0 hours 16 minutes 42 seconds  Findings:                 Two polyps were found in the rectum and sigmoid                            colon. The polyps were 2 to 4 mm in size. These                            polyps were removed with a cold snare. Resection                            and retrieval were complete.                           The exam was otherwise without abnormality on                            direct and retroflexion views. Complications:            No immediate complications. Estimated blood loss:  None. Estimated Blood Loss:     Estimated blood loss: none. Impression:               - Two 2 to 4 mm polyps in the rectum and in the                            sigmoid colon, removed with a cold snare. Resected                            and retrieved.                           - The examination was otherwise normal on direct                            and retroflexion views. Recommendation:           - Repeat colonoscopy in 5-10 years for surveillance.                           - Patient has a contact number available for                            emergencies. The signs and symptoms of potential                            delayed complications were discussed with the                            patient. Return to normal activities tomorrow.                             Written discharge instructions were provided to the                            patient.                           - Resume previous diet.                           - Continue present medications.                           - Await pathology results. Docia Chuck. Henrene Pastor, MD 03/25/2016 4:03:32 PM This report has been signed electronically.

## 2016-03-25 NOTE — Progress Notes (Signed)
Pt's states no medical or surgical changes since previsit or office visit.  No home oxygen used

## 2016-03-25 NOTE — Patient Instructions (Signed)
YOU HAD AN ENDOSCOPIC PROCEDURE TODAY AT THE [facility]:   Refer to the procedure report that was given to you for any specific questions about what was found during the examination.  If the procedure report does not answer your questions, please call your gastroenterologist to clarify.  If you requested that your care partner not be given the details of your procedure findings, then the procedure report has been included in a sealed envelope for you to review at your convenience later.  YOU SHOULD EXPECT: Some feelings of bloating in the abdomen. Passage of more gas than usual.  Walking can help get rid of the air that was put into your GI tract during the procedure and reduce the bloating. If you had a lower endoscopy (such as a colonoscopy or flexible sigmoidoscopy) you may notice spotting of blood in your stool or on the toilet paper. If you underwent a bowel prep for your procedure, you may not have a normal bowel movement for a few days.  Please Note:  You might notice some irritation and congestion in your nose or some drainage.  This is from the oxygen used during your procedure.  There is no need for concern and it should clear up in a day or so.  SYMPTOMS TO REPORT IMMEDIATELY:   Following lower endoscopy (colonoscopy or flexible sigmoidoscopy):  Excessive amounts of blood in the stool  Significant tenderness or worsening of abdominal pains  Swelling of the abdomen that is new, acute  Fever of 100F or higher   Following upper endoscopy (EGD)  Vomiting of blood or coffee ground material  New chest pain or pain under the shoulder blades  Painful or persistently difficult swallowing  New shortness of breath  Fever of 100F or higher  Black, tarry-looking stools  For urgent or emergent issues, a gastroenterologist can be reached at any hour by calling [phone].   DIET:  We do recommend a small meal at first, but then you may proceed to your regular diet.  Drink  plenty of fluids but you should avoid alcoholic beverages for 24 hours.  ACTIVITY:  You should plan to take it easy for the rest of today and you should NOT DRIVE or use heavy machinery until tomorrow (because of the sedation medicines used during the test).    FOLLOW UP: Our staff will call the number listed on your records the next business day following your procedure to check on you and address any questions or concerns that you may have regarding the information given to you following your procedure. If we do not reach you, we will leave a message.  However, if you are feeling well and you are not experiencing any problems, there is no need to return our call.  We will assume that you have returned to your regular daily activities without incident.  If any biopsies were taken you will be contacted by phone or by letter within the next 1-3 weeks.  Please call us at [phone] if you have not heard about the biopsies in 3 weeks.    SIGNATURES/CONFIDENTIALITY: You and/or your care partner have signed paperwork which will be entered into your electronic medical record.  These signatures attest to the fact that that the information above on your After Visit Summary has been reviewed and is understood.  Full responsibility of the confidentiality of this discharge information lies with you and/or your care-partner.  Polyp information given. 

## 2016-03-25 NOTE — Progress Notes (Signed)
Called to room to assist during endoscopic procedure.  Patient ID and intended procedure confirmed with present staff. Received instructions for my participation in the procedure from the performing physician.  

## 2016-03-26 ENCOUNTER — Telehealth: Payer: Self-pay

## 2016-03-26 NOTE — Telephone Encounter (Signed)
  Follow up Call-  Call back number 03/25/2016  Post procedure Call Back phone  # [phone]- wife's number  Permission to leave phone message Yes  Some recent data might be hidden     Patient questions:  Do you have a fever, pain , or abdominal swelling? No. Pain Score  0 *  Have you tolerated food without any problems? Yes.    Have you been able to return to your normal activities? Yes.    Do you have any questions about your discharge instructions: Diet   No. Medications  No. Follow up visit  No.  Do you have questions or concerns about your Care? No.  Actions: * If pain score is 4 or above: No action needed, pain <4.  No problems noted per pt. maw

## 2016-04-03 ENCOUNTER — Encounter: Payer: Self-pay | Admitting: Internal Medicine

## 2016-06-30 ENCOUNTER — Encounter: Payer: Self-pay | Admitting: Family Medicine

## 2016-06-30 ENCOUNTER — Telehealth: Payer: Self-pay | Admitting: *Deleted

## 2016-06-30 ENCOUNTER — Ambulatory Visit (INDEPENDENT_AMBULATORY_CARE_PROVIDER_SITE_OTHER): Payer: Medicare Other | Admitting: Family Medicine

## 2016-06-30 DIAGNOSIS — J069 Acute upper respiratory infection, unspecified: Secondary | ICD-10-CM | POA: Diagnosis present

## 2016-06-30 MED ORDER — BENZONATATE 200 MG PO CAPS: 200.0000 mg | ORAL_CAPSULE | Freq: Three times a day (TID) | ORAL | 0 refills | Status: DC | PRN

## 2016-06-30 MED ORDER — ONDANSETRON 8 MG PO TBDP: 8.0000 mg | ORAL_TABLET | Freq: Three times a day (TID) | ORAL | 0 refills | Status: DC | PRN

## 2016-06-30 NOTE — Assessment & Plan Note (Signed)
Patient is here with signs and symptoms consistent with viral URI. No current evidence suggesting bacterial infection. Lung sounds were clear. Patient appeared fatigued with some malaise. Recent onset of diarrhea makes possible viral gastroenteritis worth considering. Conservative care most appropriate at this time. - Discussed importance of adequate hydration. - Tylenol/ibuprofen as needed for fevers/discomfort. - Tessalon Perles or over-the-counter antitussives for cough PRN. - Discussed expectations with possibility of persistent cough lasting up to 6-8 weeks. - Zofran for nausea/vomiting. - Return precautions discussed. - Work note provided with instructions to return on 07/03/16 or sooner.

## 2016-06-30 NOTE — Patient Instructions (Signed)
Cough - Viral Upper Respiratory Infection (URI) Treatment - you should: - Push fluids the best you can with water and/or Gatorade. - Take the prescribed Zofran for any nausea or vomiting. - Take over-the-counter ibuprofen and/or Tylenol as directed on the bottles for fever, pain, and/or inflammation. - For severe symptoms (fever, chills, body aches, and malaise) I would recommend ALTERNATING Tylenol and Ibuprofen:  - 650-1000mg  of Tylenol 3 times a day (max).   - 200-600mg  of Ibuprofen 4 times a day (close to max).   - Take with food and plenty of water. - I prescribed Tessalon Perles. Take this as needed for cough. Otherwise, over-the-counter cough drops or sprays may help. - Over-the-counter nasal saline spray may also help with any nasal irritation/congestion you may have or develop.  You should be better in: 5-7 days Call us if you have severe shortness of breath, high fever or are not better in 2 weeks

## 2016-06-30 NOTE — Telephone Encounter (Signed)
Prior Authorization received from [facility] for ondansetron. PA completed online at [email or web address].  PA is pending per [facility]. Review process could take 24-72 hours. Derl Barrow, RN

## 2016-06-30 NOTE — Progress Notes (Signed)
   HPI  CC: COUGH All started on Thursday. Had some fatigue and chills. Cough developed. Saturday seemed to get acutely worse. Diarrhea and nausea. Appetite is diminished. Able to drink some.   Has been coughing for ~4 days. Cough is: productive; yellow Sputum production: yes Medications tried: robitussin, OTC "cough and cold" Taking blood pressure medications: no  Symptoms Runny nose: yes Mucous in back of throat: no Throat burning or reflux: no Wheezing or asthma: some Fever: subjective Chest Pain: no Shortness of breath: yes Leg swelling: no Hemoptysis: no Weight loss: no  ROS see HPI Smoking Status noted  CC, SH/smoking status, and VS noted  Objective: BP (!) 156/90   Pulse 78   Temp 98.3 F (36.8 C) (Oral)   Ht 5\' 7"  (1.702 m)   Wt 189 lb 6.4 oz (85.9 kg)   SpO2 95%   BMI 29.66 kg/m  Gen: NAD, alert, cooperative, appears fatigued and uncomfortable but overall well. HEENT: MMM, EOMI, PERRLA, OP erythematous without evidence of exudates, TMs clear bilaterally, no LAD, neck full ROM. CV: RRR, no murmur Resp: CTAB, no wheezes, non-labored Ext: No edema, warm, peripheral pulses intact and equal bilaterally Neuro: Alert and oriented, Speech clear, No gross deficits   Assessment and plan:  URI (upper respiratory infection) Patient is here with signs and symptoms consistent with viral URI. No current evidence suggesting bacterial infection. Lung sounds were clear. Patient appeared fatigued with some malaise. Recent onset of diarrhea makes possible viral gastroenteritis worth considering. Conservative care most appropriate at this time. - Discussed importance of adequate hydration. - Tylenol/ibuprofen as needed for fevers/discomfort. - Tessalon Perles or over-the-counter antitussives for cough PRN. - Discussed expectations with possibility of persistent cough lasting up to 6-8 weeks. - Zofran for nausea/vomiting. - Return precautions discussed. - Work note  provided with instructions to return on 07/03/16 or sooner.   Meds ordered this encounter  Medications  . ondansetron (ZOFRAN ODT) 8 MG disintegrating tablet    Sig: Take 1 tablet (8 mg total) by mouth every 8 (eight) hours as needed for nausea or vomiting.    Dispense:  20 tablet    Refill:  0  . benzonatate (TESSALON) 200 MG capsule    Sig: Take 1 capsule (200 mg total) by mouth 3 (three) times daily as needed for cough.    Dispense:  20 capsule    Refill:  0     Elberta Leatherwood, MD,MS,  PGY3 06/30/2016 2:26 PM

## 2016-07-01 NOTE — Telephone Encounter (Signed)
PA request for Ondansetron ODT 8 mg was denied via [facility].  Denial placed in provider box for review.  Derl Barrow, RN

## 2016-07-09 ENCOUNTER — Other Ambulatory Visit: Payer: Self-pay | Admitting: Family Medicine

## 2016-07-22 ENCOUNTER — Other Ambulatory Visit: Payer: Self-pay | Admitting: Family Medicine

## 2016-07-22 NOTE — Telephone Encounter (Signed)
Please inform patient that according to [place] law, he has to be seen every 3 months for chronic pain and prescription refill.  Please have him schedule an appointment with me for this medication.

## 2016-07-22 NOTE — Telephone Encounter (Signed)
Spoke with pt wife and gave her the below information and appointment scheduled for 07/31/16. Katharina Caper, April D, [place]

## 2016-07-22 NOTE — Telephone Encounter (Signed)
Pt needs a refill on tramadol and would like a 90 day supply. ep
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
## 2016-07-31 ENCOUNTER — Ambulatory Visit (INDEPENDENT_AMBULATORY_CARE_PROVIDER_SITE_OTHER): Payer: Medicare Other | Admitting: Family Medicine

## 2016-07-31 ENCOUNTER — Encounter: Payer: Self-pay | Admitting: Family Medicine

## 2016-07-31 VITALS — BP 144/90 | HR 68 | Temp 97.7°F | Ht 67.0 in | Wt 194.6 lb

## 2016-07-31 DIAGNOSIS — M5126 Other intervertebral disc displacement, lumbar region: Secondary | ICD-10-CM | POA: Diagnosis not present

## 2016-07-31 DIAGNOSIS — I10 Essential (primary) hypertension: Secondary | ICD-10-CM

## 2016-07-31 DIAGNOSIS — M25562 Pain in left knee: Secondary | ICD-10-CM | POA: Diagnosis present

## 2016-07-31 DIAGNOSIS — J302 Other seasonal allergic rhinitis: Secondary | ICD-10-CM

## 2016-07-31 MED ORDER — CETIRIZINE HCL 10 MG PO TABS: 10.0000 mg | ORAL_TABLET | Freq: Every day | ORAL | 4 refills | Status: DC

## 2016-07-31 MED ORDER — TRAMADOL HCL 50 MG PO TABS: 50.0000 mg | ORAL_TABLET | Freq: Three times a day (TID) | ORAL | 0 refills | Status: DC | PRN

## 2016-07-31 NOTE — Patient Instructions (Signed)
Plan to follow up in 3 months to meet your new provider for continued refills on the Tramadol

## 2016-07-31 NOTE — Assessment & Plan Note (Signed)
Controlled w/ Zyrtec.  This was refilled today.

## 2016-07-31 NOTE — Assessment & Plan Note (Signed)
Uncontrolled.  Discussed DASH diet and ways to eliminate hidden salts from diet.  He will work on lifestyle changes and return in next 2 weeks for BP check.  If persistently elevated, would consider starting on HCTZ.

## 2016-07-31 NOTE — Assessment & Plan Note (Addendum)
[place] narcotic database reviewed. No red flags.  No red flags on exam.  Pain is well controlled with Tramadol.  90 day supply of tramadol refilled.  Will need to return in 3 months to see new provider for further fills.

## 2016-08-24 ENCOUNTER — Emergency Department (HOSPITAL_COMMUNITY)
Admission: EM | Admit: 2016-08-24 | Discharge: 2016-08-24 | Disposition: A | Discharge status: Home / Self Care | Payer: Medicare Other | Attending: Emergency Medicine | Admitting: Emergency Medicine

## 2016-08-24 ENCOUNTER — Encounter (HOSPITAL_COMMUNITY): Payer: Self-pay

## 2016-08-24 DIAGNOSIS — Z96652 Presence of left artificial knee joint: Secondary | ICD-10-CM | POA: Diagnosis not present

## 2016-08-24 DIAGNOSIS — Z91010 Allergy to peanuts: Secondary | ICD-10-CM | POA: Diagnosis not present

## 2016-08-24 DIAGNOSIS — Z87891 Personal history of nicotine dependence: Secondary | ICD-10-CM | POA: Insufficient documentation

## 2016-08-24 DIAGNOSIS — I10 Essential (primary) hypertension: Secondary | ICD-10-CM | POA: Insufficient documentation

## 2016-08-24 DIAGNOSIS — Z79899 Other long term (current) drug therapy: Secondary | ICD-10-CM | POA: Insufficient documentation

## 2016-08-24 DIAGNOSIS — R21 Rash and other nonspecific skin eruption: Secondary | ICD-10-CM | POA: Diagnosis present

## 2016-08-24 DIAGNOSIS — L253 Unspecified contact dermatitis due to other chemical products: Secondary | ICD-10-CM

## 2016-08-24 MED ORDER — DEXAMETHASONE SODIUM PHOSPHATE 10 MG/ML IJ SOLN
10.0000 mg | Freq: Once | INTRAMUSCULAR | Status: AC
  Administered 2016-08-24: 10 mg via INTRAMUSCULAR
  Filled 2016-08-24: qty 1

## 2016-08-24 NOTE — Discharge Instructions (Signed)
Please read instructions below. Use protective equipment to avoid contact with chemicals at work. You can take Benadryl as needed every 6 hours for itching.  Avoid scratching as much as possible. Follow up with your primary care provider if rash doesn't improve in the next few days. Return to the ER for fever, difficulty breathing or swallowing, or new or worsening symptoms.

## 2016-08-24 NOTE — ED Notes (Signed)
Red, swollen area to right upper cheek. Onset 4 days ago. Does NOT remember getting bitten by anything.

## 2016-08-24 NOTE — ED Triage Notes (Addendum)
Patient here with right sided facial itching and redness x 4 days. Unsure if bitten by something. No fever, no drainage, NAD. Area below right eye
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
## 2016-08-27 ENCOUNTER — Encounter (HOSPITAL_COMMUNITY): Payer: Self-pay | Admitting: Emergency Medicine

## 2016-08-27 ENCOUNTER — Emergency Department (HOSPITAL_COMMUNITY)
Admission: EM | Admit: 2016-08-27 | Discharge: 2016-08-27 | Disposition: A | Discharge status: Home / Self Care | Payer: Medicare Other | Attending: Emergency Medicine | Admitting: Emergency Medicine

## 2016-08-27 DIAGNOSIS — H5711 Ocular pain, right eye: Secondary | ICD-10-CM | POA: Diagnosis present

## 2016-08-27 DIAGNOSIS — Z79899 Other long term (current) drug therapy: Secondary | ICD-10-CM | POA: Diagnosis not present

## 2016-08-27 DIAGNOSIS — H00032 Abscess of right lower eyelid: Secondary | ICD-10-CM | POA: Diagnosis not present

## 2016-08-27 DIAGNOSIS — Z87891 Personal history of nicotine dependence: Secondary | ICD-10-CM | POA: Diagnosis not present

## 2016-08-27 DIAGNOSIS — L03213 Periorbital cellulitis: Secondary | ICD-10-CM | POA: Diagnosis not present

## 2016-08-27 DIAGNOSIS — Z885 Allergy status to narcotic agent status: Secondary | ICD-10-CM | POA: Insufficient documentation

## 2016-08-27 DIAGNOSIS — Z91010 Allergy to peanuts: Secondary | ICD-10-CM | POA: Insufficient documentation

## 2016-08-27 MED ORDER — FLUORESCEIN SODIUM 0.6 MG OP STRP
1.0000 | ORAL_STRIP | Freq: Once | OPHTHALMIC | Status: AC
  Administered 2016-08-27: 1 via OPHTHALMIC
  Filled 2016-08-27: qty 1

## 2016-08-27 MED ORDER — IBUPROFEN 600 MG PO TABS: 600.0000 mg | ORAL_TABLET | Freq: Four times a day (QID) | ORAL | 0 refills | Status: DC | PRN

## 2016-08-27 MED ORDER — TETRACAINE HCL 0.5 % OP SOLN
2.0000 [drp] | Freq: Once | OPHTHALMIC | Status: AC
  Administered 2016-08-27: 2 [drp] via OPHTHALMIC
  Filled 2016-08-27: qty 4

## 2016-08-27 MED ORDER — POLYMYXIN B-TRIMETHOPRIM 10000-0.1 UNIT/ML-% OP SOLN
1.0000 [drp] | Freq: Four times a day (QID) | OPHTHALMIC | Status: DC
  Administered 2016-08-27: 1 [drp] via OPHTHALMIC
  Filled 2016-08-27: qty 10

## 2016-08-27 MED ORDER — CEPHALEXIN 500 MG PO CAPS: 500.0000 mg | ORAL_CAPSULE | Freq: Three times a day (TID) | ORAL | 0 refills | Status: DC
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
## 2016-08-27 NOTE — Discharge Instructions (Signed)
Please take antibiotic as prescribed for skin infection.  Place a bead of antibiotic ointment to the inner aspect of the lower eyelid every 6 hrs while awake for the next week as treatment for eye infection.  Take ibuprofen as needed for pain.  Follow up with eye specialist if you notice no improvement.

## 2016-08-27 NOTE — ED Notes (Signed)
ED Provider at bedside. 

## 2016-08-27 NOTE — ED Triage Notes (Signed)
Was seen Sunday for eye swelling and given shot of prednisone but it has gotten worse . Rt eye still swollen and vision is blurry

## 2016-09-25 ENCOUNTER — Ambulatory Visit (INDEPENDENT_AMBULATORY_CARE_PROVIDER_SITE_OTHER): Payer: Medicare Other | Admitting: Internal Medicine

## 2016-09-25 ENCOUNTER — Encounter: Payer: Self-pay | Admitting: Internal Medicine

## 2016-09-25 ENCOUNTER — Encounter: Payer: Self-pay | Admitting: *Deleted

## 2016-09-25 VITALS — BP 132/88 | HR 93 | Temp 98.6°F | Ht 67.0 in | Wt 201.0 lb

## 2016-09-25 DIAGNOSIS — R5383 Other fatigue: Secondary | ICD-10-CM

## 2016-09-25 DIAGNOSIS — Z131 Encounter for screening for diabetes mellitus: Secondary | ICD-10-CM

## 2016-09-25 DIAGNOSIS — R32 Unspecified urinary incontinence: Secondary | ICD-10-CM | POA: Diagnosis not present

## 2016-09-25 DIAGNOSIS — R195 Other fecal abnormalities: Secondary | ICD-10-CM

## 2016-09-25 DIAGNOSIS — R0689 Other abnormalities of breathing: Secondary | ICD-10-CM | POA: Diagnosis not present

## 2016-09-25 DIAGNOSIS — R7303 Prediabetes: Secondary | ICD-10-CM

## 2016-09-25 DIAGNOSIS — E669 Obesity, unspecified: Secondary | ICD-10-CM

## 2016-09-25 DIAGNOSIS — N529 Male erectile dysfunction, unspecified: Secondary | ICD-10-CM

## 2016-09-25 LAB — POCT URINALYSIS DIP (MANUAL ENTRY)
Bilirubin, UA: NEGATIVE
GLUCOSE UA: NEGATIVE mg/dL
Ketones, POC UA: NEGATIVE mg/dL
Leukocytes, UA: NEGATIVE
Nitrite, UA: NEGATIVE
PH UA: 6.5 (ref 5.0–8.0)
Protein Ur, POC: NEGATIVE mg/dL
RBC UA: NEGATIVE
SPEC GRAV UA: 1.025 (ref 1.010–1.025)
UROBILINOGEN UA: 0.2 U/dL

## 2016-09-25 LAB — HEMOCCULT GUIAC POC 1CARD (OFFICE): Fecal Occult Blood, POC: NEGATIVE

## 2016-09-25 LAB — POCT GLYCOSYLATED HEMOGLOBIN (HGB A1C): HEMOGLOBIN A1C: 5.7

## 2016-09-25 MED ORDER — FLUTICASONE PROPIONATE 50 MCG/ACT NA SUSP: 2.0000 | Freq: Every day | NASAL | 6 refills | Status: DC

## 2016-09-25 NOTE — Patient Instructions (Signed)
Mr. Edmundson,  I recommend some frequent visits to begin to work up your issues.  I will call you with lab result tomorrow or Monday.  For nasal congestion, please restart flonase.  I will review your medications closely for possible cause of erectile dysfunction.   Please follow-up next week to continue this conversation.  Best, Dr. Ola Spurr
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
## 2016-09-25 NOTE — Progress Notes (Signed)
[facility] Progress Note  Subjective:  Gregory [name] is a 56 y.o. male with history of HTN, OSA, GERD, and bipolar disorder who presents for multiple health concerns - increased weight, nighttime urinary incontinence, dark stools, heavy breathing while awake, left knee pain, and erectile dysfunction. Agreed to focus on urinary symptoms, breathing concern, dark stools, and erectile dysfunction.   #Urinary incontinence at night: - For last couple of months - Only occurs at night - Denies dysuria - Family history of diabetes (aunts and father) - Some days has also been very thirsty - Not straining to urinate or having weak flow - Does drink alcohol ROS: No fevers, no abdominal pain  #Heavy breathing: - Says only occurs when awake and exerting himself  - Not regularly physically active, largely due to chronic knee pain - Weight has increased - Has not noticed leg swelling - No trouble laying flat ROS: Denies chest pain  #Dark stools: - Denies frank blood - Has recently had some burning of anus after stooling - Notes some loose stools - Does not think he strains much and has daily BMs - Had normal EGD Feb 2018 and colonoscopy with 2 polyps that were tubular adenoma by pathology  #Erectile dysfunction: - has difficulty getting an erection - present for about 8 or 9 months - denies increased stress and endorses being attracted to his wife  Social: Married. Former smoker.   Allergies  Allergen Reactions  . Peanut-Containing Drug Products Anaphylaxis  . Demerol [Meperidine] Rash  . Hydromorphone Hcl Itching and Rash  . Meperidine Hcl Itching and Rash    delusional  . Morphine Itching and Rash    Objective: Blood pressure 132/88, pulse 93, temperature 98.6 F (37 C), temperature source Oral, height 5\' 7"  (1.702 m), weight 201 lb (91.2 kg), SpO2 92 %. Body mass index is 31.48 kg/m. Constitutional: Obese male in NAD HENT: MMM Cardiovascular: RRR, S1, S2, no  m/r/g.  Pulmonary/Chest: Effort normal and breath sounds normal. Swollen and erythematous nasal turbinates. No respiratory distress.  Abdominal: Soft. +BS, NT, ND  Musculoskeletal: No LE swelling GU: No fissures, hemorrhoids noted on anoscopy. Chaperone present.  Neurological: AOx3, no focal deficits. Skin: Skin is warm and dry. No rash noted.   Psychiatric: Somewhat anxious affect.  Vitals reviewed  Assessment/Plan: Urinary incontinence - Occurs only at night. - Differential includes diabetes, urinary tract infection, overactive bladder, psychologic stress, and exposure to bladder irritants (caffeine and alcohol) - Will obtain UA and hgb A1c - Will check kidney function with BMP - Should obtain better EtOH and caffeine intake history at follow-up  Heavy breathing - Differential includes general deconditioning vs cardiac origin. Normal lung exam today. No LE edema, orthopnea, murmur, or crackles to suggest CHF though has had weight gain. Denies chest pain. Negative stress test in 2011 - Will obtain CBC to r/o anemia and TSH  - Consider Cardiology referral  Dark stools - Negative FOBT today  Erectile dysfunction - Denies increased stress. Could be medication side effect, as is on zyprexa. Could be substance use (EtOH). - Consider am testosterone level/prolactin level  Follow-up in 1-2 weeks to continue to discuss work-up.  Olene Floss, MD [place], PGY-3

## 2016-09-26 LAB — BASIC METABOLIC PANEL
BUN / CREAT RATIO: 15 (ref 9–20)
BUN: 21 mg/dL (ref 6–24)
CALCIUM: 9.5 mg/dL (ref 8.7–10.2)
CHLORIDE: 103 mmol/L (ref 96–106)
CO2: 25 mmol/L (ref 20–29)
Creatinine, Ser: 1.41 mg/dL — ABNORMAL HIGH (ref 0.76–1.27)
GFR calc non Af Amer: 55 mL/min/{1.73_m2} — ABNORMAL LOW (ref >59)
GFR, EST AFRICAN AMERICAN: 63 mL/min/{1.73_m2} (ref >59)
GLUCOSE: 92 mg/dL (ref 65–99)
POTASSIUM: 4.6 mmol/L (ref 3.5–5.2)
Sodium: 141 mmol/L (ref 134–144)

## 2016-09-26 LAB — TSH: TSH: 1.74 u[IU]/mL (ref 0.450–4.500)

## 2016-09-26 LAB — CBC
Hematocrit: 40.2 % (ref 37.5–51.0)
Hemoglobin: 13.1 g/dL (ref 13.0–17.7)
MCH: 29.4 pg (ref 26.6–33.0)
MCHC: 32.6 g/dL (ref 31.5–35.7)
MCV: 90 fL (ref 79–97)
Platelets: 326 10*3/uL (ref 150–379)
RBC: 4.46 x10E6/uL (ref 4.14–5.80)
RDW: 13.9 % (ref 12.3–15.4)
WBC: 6.7 10*3/uL (ref 3.4–10.8)

## 2016-09-27 DIAGNOSIS — R32 Unspecified urinary incontinence: Secondary | ICD-10-CM | POA: Insufficient documentation

## 2016-09-27 DIAGNOSIS — R0689 Other abnormalities of breathing: Secondary | ICD-10-CM | POA: Insufficient documentation

## 2016-09-27 DIAGNOSIS — N529 Male erectile dysfunction, unspecified: Secondary | ICD-10-CM | POA: Insufficient documentation

## 2016-09-27 DIAGNOSIS — R195 Other fecal abnormalities: Secondary | ICD-10-CM | POA: Insufficient documentation

## 2016-09-27 NOTE — Assessment & Plan Note (Addendum)
-   Occurs only at night. - Differential includes diabetes, urinary tract infection, overactive bladder, psychologic stress, and exposure to bladder irritants (caffeine and alcohol) - Will obtain UA and hgb A1c - Will check kidney function with BMP - Should obtain better EtOH and caffeine intake history at follow-up

## 2016-09-27 NOTE — Assessment & Plan Note (Signed)
-   Denies increased stress. Could be medication side effect, as is on zyprexa. Could be substance use (EtOH). - Consider am testosterone level/prolactin level

## 2016-09-27 NOTE — Assessment & Plan Note (Signed)
-   Negative FOBT today

## 2016-09-27 NOTE — Assessment & Plan Note (Addendum)
-   Differential includes general deconditioning vs cardiac origin. Normal lung exam today. No LE edema, orthopnea, murmur, or crackles to suggest CHF though has had weight gain. Denies chest pain. Negative stress test in 2011 - Will obtain CBC to r/o anemia and TSH  - Consider Cardiology referral

## 2016-10-02 ENCOUNTER — Encounter: Payer: Self-pay | Admitting: Internal Medicine

## 2016-10-02 ENCOUNTER — Ambulatory Visit (INDEPENDENT_AMBULATORY_CARE_PROVIDER_SITE_OTHER): Payer: Medicare Other | Admitting: Internal Medicine

## 2016-10-02 VITALS — BP 132/94 | HR 93 | Temp 98.3°F | Ht 67.0 in | Wt 200.0 lb

## 2016-10-02 DIAGNOSIS — R5383 Other fatigue: Secondary | ICD-10-CM | POA: Diagnosis not present

## 2016-10-02 DIAGNOSIS — R32 Unspecified urinary incontinence: Secondary | ICD-10-CM

## 2016-10-02 DIAGNOSIS — N529 Male erectile dysfunction, unspecified: Secondary | ICD-10-CM

## 2016-10-02 MED ORDER — SILDENAFIL CITRATE 20 MG PO TABS: 20.0000 mg | ORAL_TABLET | Freq: Every day | ORAL | 0 refills | Status: DC | PRN

## 2016-10-02 NOTE — Patient Instructions (Signed)
Mr. Rains,  Please make a lab appointment at your convenience. We will check testosterone level and prolactin and a lipid panel. Please follow-up after that.  I recommend trying to do some regular exercise for energy levels.  Try viagra 1 hour before sexual activity. Please note whether this improves urinary symptoms.  Best, Dr. Ola Spurr

## 2016-10-02 NOTE — Progress Notes (Signed)
[facility] Progress Note  Subjective:  Gregory [name] is a 56 y.o. HTN, OSA, GERD, and bipolar disorder who presents for follow-up of labs for complaints of increased fatigue, nocturnal enuresis, and erectile dysfunction.  #Fatigue: - Sleeps well with trazodone - Associates with increased weight gain - Says mood worse with gain of weight - Previously diagnosed with OSA but does not use mask; uses nasal strips - Not regularly active; says limited by L knee pain and pulled hamstring on the R  #Nocturnal enuresis: - Ongoing for last few months - Intermittent back pain that his wife worries could be from his kidneys - Feels that he can fully empty his bladder but notes increased frequency - Drinks EtOH ROS: No fevers, no n/v  #Erectile dysfunction: - Interested in trying a medication - Has noticed for less than a year - Wife thinks this could be affecting his mood  Social: Former smoker  Allergies  Allergen Reactions  . Peanut-Containing Drug Products Anaphylaxis  . Demerol [Meperidine] Rash  . Hydromorphone Hcl Itching and Rash  . Meperidine Hcl Itching and Rash    delusional  . Morphine Itching and Rash    Objective: Blood pressure (!) 132/94, pulse 93, temperature 98.3 F (36.8 C), temperature source Oral, height 5\' 7"  (1.702 m), weight 200 lb (90.7 kg), SpO2 93 %. Body mass index is 31.32 kg/m. Constitutional: Obese male in NAD Cardiovascular: RRR, S1, S2, no m/r/g.  Pulmonary/Chest: Effort normal and breath sounds normal. No respiratory distress.  Abdominal: Soft. +BS, NT, ND Musculoskeletal: Lumbar paraspinal TTP. No radiation of pain with straight leg raise. No CVA tenderness.  Neurological: AOx3, no focal deficits.  Psychiatric: Flat affect but answers questions appropriately and makes eye contact.  Vitals reviewed  IIEF: Domain A (erectile dysfunction): 20 (max score - 30)  Domain B (orgasmic function): 8 (max score - 10) Domain C (sexual  desire): 4 (max score - 10) Domain D (intercourse satisfaction): 7 (max score - 15) Domain E (overal satisfaction): 6 (max score - 10)  Assessment/Plan: Erectile dysfunction - Based on International index of erectile function, moderately lowered score in erectile dysfunction with lower scores in psychosocial categories and near normal score in orgasmic function. - Will obtain future labs of prolactin and a.m. total testosterone.  - Not following regularly with anyone for mood; will recommend establishing with Psychiatry pending labs - Patient would like to try viagra. Prescribed 20 mg to take prn, with instructions that he may need more than 1 tablet for results. Asked him to keep track of whether his urinary symptoms seem to improve with this medication.   Urinary incontinence - Patient reported as a new symptom at last visit but records of OV for this complaint in 2013. UA negative for blood or signs of infection earlier this month. A1c only in prediabetes range at 5.7. PSA and prostate exam normal when having similar symptoms in the past. Does have history of OSA but says he did not receive CPAP mask in the past/could not tolerate mask and does not think he would wear this now to warrant another sleep study. - Will order PSA - Inquire more fully about EtOH use and perform prostate exam at follow-up - If urinary symptoms improve with viagra, would favor contribution of BPH  Other fatigue - Patient notes his increased weight is affecting his mood. Used to be very active and no longer getting activity beyond walking at work. Hx of bipolar disorder with flat affect today  and on only low doses of prozac and zyprexa. Having some symptoms of hypoandrogenism, as well.  - Normal labs at last visit with no anemia of hypothyroidism. - To obtain a.m. total testosterone level.  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  Olene Floss, MD [place], PGY-3

## 2016-10-03 NOTE — Assessment & Plan Note (Addendum)
-   Based on International index of erectile function, moderately lowered score in erectile dysfunction with lower scores in psychosocial categories and near normal score in orgasmic function. - Will obtain future labs of prolactin and a.m. total testosterone.  - Not following regularly with anyone for mood; will recommend establishing with Psychiatry pending labs - Patient would like to try viagra. Prescribed 20 mg to take prn, with instructions that he may need more than 1 tablet for results. Asked him to keep track of whether his urinary symptoms seem to improve with this medication.

## 2016-10-03 NOTE — Assessment & Plan Note (Addendum)
-   Patient reported as a new symptom at last visit but records of OV for this complaint in 2013. UA negative for blood or signs of infection earlier this month. A1c only in prediabetes range at 5.7. PSA and prostate exam normal when having similar symptoms in the past. Does have history of OSA but says he did not receive CPAP mask in the past/could not tolerate mask and does not think he would wear this now to warrant another sleep study. - Will order PSA - Inquire more fully about EtOH use and perform prostate exam at follow-up - If urinary symptoms improve with viagra, would favor contribution of BPH

## 2016-10-03 NOTE — Assessment & Plan Note (Addendum)
-   Patient notes his increased weight is affecting his mood. Used to be very active and no longer getting activity beyond walking at work. Hx of bipolar disorder with flat affect today and on only low doses of prozac and zyprexa. Having some symptoms of hypoandrogenism, as well.  - Normal labs at last visit with no anemia of hypothyroidism. - To obtain a.m. total testosterone level.  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

## 2016-10-06 ENCOUNTER — Telehealth: Payer: Self-pay | Admitting: *Deleted

## 2016-10-06 NOTE — Telephone Encounter (Signed)
Received PA for sildenafil.  Sildenafil in an exclusion drug on patient's formulary if treating for ED.  Medication need to change to something different.  Derl Barrow, RN

## 2016-10-07 ENCOUNTER — Other Ambulatory Visit: Payer: Medicare Other

## 2016-10-07 DIAGNOSIS — N529 Male erectile dysfunction, unspecified: Secondary | ICD-10-CM | POA: Diagnosis not present

## 2016-10-07 DIAGNOSIS — R32 Unspecified urinary incontinence: Secondary | ICD-10-CM

## 2016-10-08 LAB — LIPID PANEL
CHOL/HDL RATIO: 2.8 ratio (ref 0.0–5.0)
CHOLESTEROL TOTAL: 119 mg/dL (ref 100–199)
HDL: 42 mg/dL (ref >39)
LDL CALC: 53 mg/dL (ref 0–99)
Triglycerides: 121 mg/dL (ref 0–149)
VLDL CHOLESTEROL CAL: 24 mg/dL (ref 5–40)

## 2016-10-08 LAB — TESTOSTERONE: TESTOSTERONE: 306 ng/dL (ref 264–916)

## 2016-10-08 LAB — PROLACTIN: Prolactin: 18.1 ng/mL — ABNORMAL HIGH (ref 4.0–15.2)

## 2016-10-08 LAB — PSA: Prostate Specific Ag, Serum: 1 ng/mL (ref 0.0–4.0)

## 2016-10-09 ENCOUNTER — Telehealth: Payer: Self-pay | Admitting: Internal Medicine

## 2016-10-09 NOTE — Telephone Encounter (Signed)
Reached patient's wife and let her know recent lab results did not give an explanation for his ED and nighttime enuresis. She divulged that he has increased his drinking and is now having 8-10 beers a day. Let her know this was most likely causing both issues and that if it would be helpful to have her husband see me back in clinic to reinforce EtOH's impact and offer resources for cutting back. She agreed and appointment made for afternoon of 9/5.   In addition, completed prior auth over phone for sildenafil. Case #[id number].  Olene Floss, MD [place], PGY-3

## 2016-10-14 ENCOUNTER — Telehealth: Payer: Self-pay | Admitting: Internal Medicine

## 2016-10-14 ENCOUNTER — Encounter: Payer: Self-pay | Admitting: Family Medicine

## 2016-10-14 ENCOUNTER — Ambulatory Visit (INDEPENDENT_AMBULATORY_CARE_PROVIDER_SITE_OTHER): Payer: Medicare Other | Admitting: Family Medicine

## 2016-10-14 VITALS — BP 130/90 | HR 89 | Temp 97.0°F | Wt 200.0 lb

## 2016-10-14 DIAGNOSIS — R05 Cough: Secondary | ICD-10-CM

## 2016-10-14 DIAGNOSIS — R059 Cough, unspecified: Secondary | ICD-10-CM

## 2016-10-14 MED ORDER — DEXTROMETHORPHAN HBR 15 MG/5ML PO SYRP: 10.0000 mL | ORAL_SOLUTION | Freq: Four times a day (QID) | ORAL | 0 refills | Status: DC | PRN

## 2016-10-14 NOTE — Telephone Encounter (Signed)
Attempted to call patient to discuss that insurance denied [place] for sildenafil. But went to voicemail. Will discuss with Dr. Ola Spurr regarding next steps for this patient when she returns next week.
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
## 2016-10-14 NOTE — Patient Instructions (Signed)
Upper Respiratory Infection, Adult Most upper respiratory infections (URIs) are a viral infection of the air passages leading to the lungs. A URI affects the nose, throat, and upper air passages. The most common type of URI is nasopharyngitis and is typically referred to as "the common cold." URIs run their course and usually go away on their own. Most of the time, a URI does not require medical attention, but sometimes a bacterial infection in the upper airways can follow a viral infection. This is called a secondary infection. Sinus and middle ear infections are common types of secondary upper respiratory infections. Bacterial pneumonia can also complicate a URI. A URI can worsen asthma and chronic obstructive pulmonary disease (COPD). Sometimes, these complications can require emergency medical care and may be life threatening. What are the causes? Almost all URIs are caused by viruses. A virus is a type of germ and can spread from one person to another. What increases the risk? You may be at risk for a URI if:  You smoke.  You have chronic heart or lung disease.  You have a weakened defense (immune) system.  You are very young or very old.  You have nasal allergies or asthma.  You work in crowded or poorly ventilated areas.  You work in health care facilities or schools.  What are the signs or symptoms? Symptoms typically develop 2-3 days after you come in contact with a cold virus. Most viral URIs last 7-10 days. However, viral URIs from the influenza virus (flu virus) can last 14-18 days and are typically more severe. Symptoms may include:  Runny or stuffy (congested) nose.  Sneezing.  Cough.  Sore throat.  Headache.  Fatigue.  Fever.  Loss of appetite.  Pain in your forehead, behind your eyes, and over your cheekbones (sinus pain).  Muscle aches.  How is this diagnosed? Your health care provider may diagnose a URI by:  Physical exam.  Tests to check that your  symptoms are not due to another condition such as: ? Strep throat. ? Sinusitis. ? Pneumonia. ? Asthma.  How is this treated? A URI goes away on its own with time. It cannot be cured with medicines, but medicines may be prescribed or recommended to relieve symptoms. Medicines may help:  Reduce your fever.  Reduce your cough.  Relieve nasal congestion.  Follow these instructions at home:  Take medicines only as directed by your health care provider.  Gargle warm saltwater or take cough drops to comfort your throat as directed by your health care provider.  Use a warm mist humidifier or inhale steam from a shower to increase air moisture. This may make it easier to breathe.  Drink enough fluid to keep your urine clear or pale yellow.  Eat soups and other clear broths and maintain good nutrition.  Rest as needed.  Return to work when your temperature has returned to normal or as your health care provider advises. You may need to stay home longer to avoid infecting others. You can also use a face mask and careful hand washing to prevent spread of the virus.  Increase the usage of your inhaler if you have asthma.  Do not use any tobacco products, including cigarettes, chewing tobacco, or electronic cigarettes. If you need help quitting, ask your health care provider. How is this prevented? The best way to protect yourself from getting a cold is to practice good hygiene.  Avoid oral or hand contact with people with cold symptoms.  Wash your   hands often if contact occurs.  There is no clear evidence that vitamin C, vitamin E, echinacea, or exercise reduces the chance of developing a cold. However, it is always recommended to get plenty of rest, exercise, and practice good nutrition. Contact a health care provider if:  You are getting worse rather than better.  Your symptoms are not controlled by medicine.  You have chills.  You have worsening shortness of breath.  You have  brown or red mucus.  You have yellow or brown nasal discharge.  You have pain in your face, especially when you bend forward.  You have a fever.  You have swollen neck glands.  You have pain while swallowing.  You have white areas in the back of your throat. Get help right away if:  You have severe or persistent: ? Headache. ? Ear pain. ? Sinus pain. ? Chest pain.  You have chronic lung disease and any of the following: ? Wheezing. ? Prolonged cough. ? Coughing up blood. ? A change in your usual mucus.  You have a stiff neck.  You have changes in your: ? Vision. ? Hearing. ? Thinking. ? Mood. This information is not intended to replace advice given to you by your health care provider. Make sure you discuss any questions you have with your health care provider. Document Released: 07/30/2000 Document Revised: 10/07/2015 Document Reviewed: 05/11/2013 Elsevier Interactive Patient Education  2017 Elsevier Inc.  

## 2016-10-15 ENCOUNTER — Telehealth: Payer: Self-pay | Admitting: Family Medicine

## 2016-10-15 NOTE — Telephone Encounter (Signed)
The cough medicine isnt working at all. The pharmacist gave them OTC cough medicine.  Pt has not received the Viagra that has been prescribed. Please advise

## 2016-10-16 ENCOUNTER — Telehealth: Payer: Self-pay | Admitting: Student

## 2016-10-16 NOTE — Telephone Encounter (Signed)
Called patient. No answer. Left voicemail.   [name], MD [place], PGY-3

## 2016-10-16 NOTE — Telephone Encounter (Signed)
FM after hour call response: Called patient in response to her page on after hour pager. Patient picked up the phone and verified her name and age.  CC: Returning Dr. Thad Ranger call.  HPI: Patient picked up the phone and handed to his wife who states missing Dr. Thad Ranger call few minutes ago. She says her husband was seen by Dr. Juanito Doom and was prescribed OTC cough medicine, which is not helping with the cough. Denies chest pain or shortness of breath. I recommended calling back in the morning to speak to Dr. Juanito Doom. Meanwhile, I suggested trying a tablespoonful of honey before bedtime and good hydration. I also informed them that the cough might take up to 5-6 weeks to resolve completely. Patient is not diabetic.

## 2016-10-22 ENCOUNTER — Ambulatory Visit: Payer: Medicare Other | Admitting: Internal Medicine

## 2016-10-23 ENCOUNTER — Other Ambulatory Visit: Payer: Self-pay | Admitting: Family Medicine

## 2016-10-23 DIAGNOSIS — M5126 Other intervertebral disc displacement, lumbar region: Secondary | ICD-10-CM

## 2016-10-23 DIAGNOSIS — M25562 Pain in left knee: Secondary | ICD-10-CM

## 2016-10-27 ENCOUNTER — Other Ambulatory Visit: Payer: Self-pay | Admitting: Family Medicine

## 2016-10-27 NOTE — Telephone Encounter (Signed)
Wife is calling because Dr. Juanito Doom said that she would call in her husbands Tramadol after she finished with her other patients. Can she call this in today. jw

## 2016-10-27 NOTE — Telephone Encounter (Signed)
This is not true. I have never spoken to the patient or his wife. Per Dr. Marjean Donna note on 07/31/16, patient was supposed to follow up in 3 months to meet new PCP and obtain Tramadol refill.   [facility] team, please call patient and advise if he needs a Tramadol refill he needs to schedule an appointment. Thank you.   [name], MD [place], PGY-3

## 2016-10-28 ENCOUNTER — Telehealth: Payer: Self-pay | Admitting: *Deleted

## 2016-10-28 NOTE — Telephone Encounter (Signed)
Prior Authorization received from [facility] for Sildenafil 20 mg tablet.  PA form placed in provider box for completion. Derl Barrow, RN

## 2016-10-28 NOTE — Telephone Encounter (Signed)
Completed and placed in Tamika's box. 

## 2016-10-29 NOTE — Telephone Encounter (Signed)
Pt has appointment for this on 10/30/16. Katharina Caper, Isela Stantz D, [place]
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
## 2016-10-30 ENCOUNTER — Ambulatory Visit (INDEPENDENT_AMBULATORY_CARE_PROVIDER_SITE_OTHER): Payer: Medicare Other | Admitting: Family Medicine

## 2016-10-30 ENCOUNTER — Encounter: Payer: Self-pay | Admitting: Family Medicine

## 2016-10-30 VITALS — BP 152/102 | HR 60 | Temp 97.8°F | Ht 67.0 in | Wt 201.4 lb

## 2016-10-30 DIAGNOSIS — M25562 Pain in left knee: Secondary | ICD-10-CM | POA: Diagnosis not present

## 2016-10-30 DIAGNOSIS — I10 Essential (primary) hypertension: Secondary | ICD-10-CM

## 2016-10-30 DIAGNOSIS — M5126 Other intervertebral disc displacement, lumbar region: Secondary | ICD-10-CM

## 2016-10-30 DIAGNOSIS — G4733 Obstructive sleep apnea (adult) (pediatric): Secondary | ICD-10-CM | POA: Diagnosis not present

## 2016-10-30 DIAGNOSIS — G8921 Chronic pain due to trauma: Secondary | ICD-10-CM | POA: Diagnosis not present

## 2016-10-30 DIAGNOSIS — G8929 Other chronic pain: Secondary | ICD-10-CM | POA: Diagnosis not present

## 2016-10-30 DIAGNOSIS — H547 Unspecified visual loss: Secondary | ICD-10-CM | POA: Diagnosis present

## 2016-10-30 MED ORDER — TRAMADOL HCL 50 MG PO TABS: 50.0000 mg | ORAL_TABLET | Freq: Three times a day (TID) | ORAL | 0 refills | Status: DC | PRN

## 2016-10-30 NOTE — Assessment & Plan Note (Addendum)
Uncontrolled blood pressure day. Was previously trying lifestyle modifications. Suspect that his blood pressure is elevated secondary to poor diet, pain, and uncontrolled OSA. Creatinine checked 1 month ago and was elevated at 1.4 -Wife is a CMA and she will check blood pressure daily for the next week and let me know if he is not at goal -May need to start medication if blood pressure not at goal, consider amlodipine versus HCTZ
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
## 2016-10-30 NOTE — Addendum Note (Signed)
Addended by: Carlyle Dolly on: 10/30/2016 05:48 PM   Modules accepted: Orders

## 2016-10-30 NOTE — Assessment & Plan Note (Signed)
Did not test vision due to time constraints today as this was mentioned towards the end of the visit. Wife requesting referral to ophthalmology. Ophthalmology referral placed.

## 2016-10-30 NOTE — Assessment & Plan Note (Signed)
Endorsing that he is not using CPAP machine because he couldn't afford it last year when he didn't have medicare. We will represcribed CPAP machine and send a message to our nurse Lauren for assistance in obtaining this.

## 2016-10-30 NOTE — Patient Instructions (Addendum)
Thank you for coming in today, it was so nice to see you! Today we talked about:    Pain medication: I have refilled your Tramadol. Please see me again in 3 months for pain  Blood pressure: Please check your pressure every day, your goal BP is less than 140/90. After 1 week of collecting BP, please call me or send me a message on MyChart and let me know what the numbers are  Please schedule an appointment in the future with me to talk about trouble eating rice  I have sent a message to my nurse to try and get your a CPAP machine  Please follow up in 3 months for pain medicine. You can schedule this appointment at the front desk before you leave or call the clinic.  Bring in all your medications or supplements to each appointment for review.   If we ordered any tests today, you will be notified via telephone of any abnormalities. If everything is normal you will get a letter in the mail.   If you have any questions or concerns, please do not hesitate to call the office at [phone]. You can also message me directly via MyChart.   Sincerely,  [name], MD

## 2016-11-03 NOTE — Telephone Encounter (Signed)
PA for Sildenafil 20 mg tablet was denied via [employer] under Medicare Part D. Medication can be covered if patient has a diagnosis of pulmonary arterial hypertension. Derl Barrow, RN

## 2016-11-04 ENCOUNTER — Encounter: Payer: Self-pay | Admitting: Family Medicine

## 2016-11-04 ENCOUNTER — Ambulatory Visit (INDEPENDENT_AMBULATORY_CARE_PROVIDER_SITE_OTHER): Payer: Medicare Other | Admitting: Family Medicine

## 2016-11-04 VITALS — BP 128/78 | Ht 67.0 in | Wt 205.0 lb

## 2016-11-04 DIAGNOSIS — R29898 Other symptoms and signs involving the musculoskeletal system: Secondary | ICD-10-CM

## 2016-11-04 DIAGNOSIS — M5410 Radiculopathy, site unspecified: Secondary | ICD-10-CM | POA: Diagnosis not present

## 2016-11-05 NOTE — Progress Notes (Signed)
    CHIEF COMPLAINT / HPI:  Right leg weakness.  This is been going on a couple of months.  He has previously had problems with his left leg and hamstring but this is a new issue on the right side  He will have onset of crampy pain in the posterior portion of his right thigh and then the leg and thigh will become numb and weak.  He has almost fallen several times.  He has not had bowel or bladder I  At times, he has normal sensation in h but then he'll get the cramping in the leg  Will have onset of numbness that may last minutes to hours.  It is usually not totally numb but occasionally can be.  He's noted no skin changes and no change in the warmth of his right foot     REVIEW OF SYSTEMS:  See HPI.  Additional pertinent review of systems is negative for fever, unusual weight change.  PERTINENT  PMH / PSH: I have reviewed the patient's medications, allergies, past medical and surgical history, smoking status and updated in the EMR as appropriate. History of spinal fusion at C3-C5.  [place] bipolar disorder History of total knee replacement on the left. History of leg length discrepancy with the right leg shorter than the left Previous smoker Family history is noncontributory   OBJECTIVE:   GENERAL: Well developed male HIPS: Slightly decreased intellectual rotation both sides with some mil.  Abductor and abductor strength at the hips is 5 out of 5 and symmetrical. MSK: Both posterior thighs have normal muscle bulk and tone.  Strength testing on the right is revealing of less strength compared w.  Unclear whether pain is contributing to NEURO: DTRs at the knees are bilaterally symmetrical.  1+ bilaterally at Achilles.  Mildly decreased sensation bilateral lo symmetrical. PSYCHIATRIC: Alert and orie  He seems somewhat fatigued today.  He ask and answers questions appropriately  Eye contact is moderate.  ASSESSMENT / PLAN: Please see problem oriented charting for details

## 2016-11-05 NOTE — Assessment & Plan Note (Signed)
He has a very complex history particularly with history of left hamstring problems in the .  His symptom report makes me concerned for degenerative disc disease in the lumbar spine I will follow-up with him by phone   Once we get the MRI results.  Should he have any new or worsening symptoms in interim he will let me know.

## 2016-11-05 NOTE — Telephone Encounter (Signed)
Let patient know PA for sildenafil was denied by leaving a voicemail. Let him know #30 pills at 20 mg is about $15 at several local pharmacies Gregory [name], [place]) and about $30-$40 for #90. Asked him to call back if he would like to me to send in a refill but that I think his symptoms would likely improve with lifestyle modification (cutting back on EtOH). Asked him to make an appointment to discuss this further at his convenience (no-showed last scheduled OV).

## 2016-11-06 ENCOUNTER — Telehealth: Payer: Self-pay | Admitting: Family Medicine

## 2016-11-06 DIAGNOSIS — M25569 Pain in unspecified knee: Principal | ICD-10-CM

## 2016-11-06 DIAGNOSIS — G8929 Other chronic pain: Secondary | ICD-10-CM

## 2016-11-06 LAB — TOXASSURE SELECT 13 (MW), URINE

## 2016-11-06 NOTE — Telephone Encounter (Signed)
Patient's UDS showed evidence of cocaine use.  Called patient to discuss this. He admits to using cocaine. He states he only did it a couple times awhile ago and he has stopped. Discussed that we could not longer prescribe him pain medications as this violates our clinic policy and the pain contract he signed. Discussed that I will not be prescribing him Tramadol anymore. He was interested in seeing a pain management doctor, I have placed a referral for this. All questions answered. He was appreciate of the call.   [name], MD [place], PGY-3

## 2016-11-16 ENCOUNTER — Ambulatory Visit
Admission: RE | Admit: 2016-11-16 | Discharge: 2016-11-16 | Disposition: A | Discharge status: Home / Self Care | Payer: Medicare Other | Source: Ambulatory Visit | Attending: Family Medicine | Admitting: Family Medicine

## 2016-11-16 DIAGNOSIS — R29898 Other symptoms and signs involving the musculoskeletal system: Secondary | ICD-10-CM

## 2016-11-16 DIAGNOSIS — M4807 Spinal stenosis, lumbosacral region: Secondary | ICD-10-CM | POA: Diagnosis not present

## 2016-12-01 ENCOUNTER — Ambulatory Visit (INDEPENDENT_AMBULATORY_CARE_PROVIDER_SITE_OTHER): Payer: Medicare Other | Admitting: Family Medicine

## 2016-12-01 ENCOUNTER — Encounter: Payer: Self-pay | Admitting: Family Medicine

## 2016-12-01 VITALS — BP 156/96 | HR 76 | Temp 98.3°F | Ht 67.0 in | Wt 200.6 lb

## 2016-12-01 DIAGNOSIS — I10 Essential (primary) hypertension: Secondary | ICD-10-CM

## 2016-12-01 DIAGNOSIS — J019 Acute sinusitis, unspecified: Secondary | ICD-10-CM

## 2016-12-01 MED ORDER — AZITHROMYCIN 250 MG PO TABS: ORAL_TABLET | ORAL | 0 refills | Status: AC

## 2016-12-01 NOTE — Patient Instructions (Signed)
Thank you for coming in to see [place] today. Please see below to review our plan for today's visit.  1. Take the azithromycin as instructed. I suspect symptoms are due to sinusitis. Drink plenty of water to stay hydrated. 2. I will all you if your blood work is abnormal, otherwise expect to receive the results in the mail. 3. Return to clinic in 2 weeks if your symptoms do not resolve.  Please call the clinic at [phone] if your symptoms worsen or you have any concerns. It was my pleasure to see you. -- Harriet Butte, [place], PGY-2

## 2016-12-01 NOTE — Assessment & Plan Note (Addendum)
Acute. Suspect secondary infection due to allergic rhinitis given history of sneezing,pruritus, and watery eye discharge. Symptoms of concern include night sweats and purulent nasal discharge.  --Will prescribe azithromycin Dosepak --Encourage patient remained well-hydrated --RTC if symptoms do not improve in 2 week

## 2016-12-01 NOTE — Progress Notes (Signed)
   Subjective:   Patient ID: Gregory [name]    DOB: September 27, 1960, 56 y.o. male   MRN: [id number]  CC: "Cough"  HPI: Gregory [name] is a 56 y.o. male who presents for a same day appointment for the following:  URI  Has been sick for 4 weeks. Nasal discharge: yellow Medications tried: tylenol, tessalon pearls, wife's amoxicillin (had some improvement for the 3 days he took it), robitussin Sick contacts: no  Symptoms Fever: no measured but endorsing nights sweats Headache or face pain: bilateral headache behind yes Tooth pain: no Sneezing: yes Scratchy throat: yes Allergies: no Muscle aches: no Severe fatigue: yes Stiff neck: no Shortness of breath: yes Rash: no Sore throat or swollen glands: yes  Complete ROS performed, see HPI for pertinent.  [place]: OSA, GERD, HTN, HLD, ED, bipolar disorder, psoriasis, gout, vit D deficiency, allergic rhinitis. Surgical history L-tot knee, cervical fusion, carpal tunnel release. Family history DM, unspecified CA, asthma. Patient is a Restaurant manager, fast food. Smoking status reviewed. Medications reviewed.  Objective:   BP (!) 156/96 (BP Location: Left Arm, Patient Position: Sitting, Cuff Size: Normal)   Pulse 76   Temp 98.3 F (36.8 C) (Oral)   Ht 5\' 7"  (1.702 m)   Wt 200 lb 9.6 oz (91 kg)   SpO2 95%   BMI 31.42 kg/m  Vitals and nursing note reviewed.  General: tired-appearing with non-toxic appearance HEENT: normocephalic, atraumatic, moist mucous membranes, erythematousconjunctiva, tonsils nonedematous, postnasal drip present Neck: supple, non-tender without lymphadenopathy CV: regular rate and rhythm without murmurs, rubs, or gallops, no lower extremity edema Lungs: clear to auscultation bilaterally with normal work of breathing Abdomen: soft, non-tender, non-distended, no masses or organomegaly palpable, normoactive bowel sounds Skin: warm, dry, no rashes or lesions, cap refill < 2 seconds Extremities: warm and well perfused, normal  tone  Assessment & Plan:   Sinusitis, acute Acute. Suspect secondary infection due to allergic rhinitis given history of sneezing,pruritus, and watery eye discharge. Symptoms of concern include night sweats and purulent nasal discharge.  --Will prescribe azithromycin Dosepak --Encourage patient remained well-hydrated --RTC if symptoms do not improve in 2 week  Orders Placed This Encounter  Procedures  . CBC with Differential/Platelet  . Basic metabolic panel   Meds ordered this encounter  Medications  . azithromycin (ZITHROMAX) 250 MG tablet    Sig: Take 2 tabs by mouth on day 1, then 1 tab by mouth daily    Dispense:  6 each    Refill:  0    Harriet Butte, DO [place], PGY-2 12/01/2016 5:24 PM

## 2016-12-02 ENCOUNTER — Encounter: Payer: Self-pay | Admitting: Family Medicine

## 2016-12-02 LAB — CBC WITH DIFFERENTIAL/PLATELET
BASOS ABS: 0 10*3/uL (ref 0.0–0.2)
Basos: 0 %
EOS (ABSOLUTE): 0.2 10*3/uL (ref 0.0–0.4)
Eos: 3 %
HEMOGLOBIN: 13.7 g/dL (ref 13.0–17.7)
Hematocrit: 40.2 % (ref 37.5–51.0)
IMMATURE GRANS (ABS): 0 10*3/uL (ref 0.0–0.1)
IMMATURE GRANULOCYTES: 0 %
LYMPHS: 37 %
Lymphocytes Absolute: 2.6 10*3/uL (ref 0.7–3.1)
MCH: 29.6 pg (ref 26.6–33.0)
MCHC: 34.1 g/dL (ref 31.5–35.7)
MCV: 87 fL (ref 79–97)
MONOCYTES: 10 %
Monocytes Absolute: 0.7 10*3/uL (ref 0.1–0.9)
NEUTROS ABS: 3.4 10*3/uL (ref 1.4–7.0)
Neutrophils: 50 %
Platelets: 348 10*3/uL (ref 150–379)
RBC: 4.63 x10E6/uL (ref 4.14–5.80)
RDW: 14.2 % (ref 12.3–15.4)
WBC: 6.9 10*3/uL (ref 3.4–10.8)

## 2016-12-02 LAB — BASIC METABOLIC PANEL
BUN/Creatinine Ratio: 12 (ref 9–20)
BUN: 14 mg/dL (ref 6–24)
CALCIUM: 9.4 mg/dL (ref 8.7–10.2)
CO2: 23 mmol/L (ref 20–29)
CREATININE: 1.16 mg/dL (ref 0.76–1.27)
Chloride: 104 mmol/L (ref 96–106)
GFR calc Af Amer: 80 mL/min/{1.73_m2} (ref >59)
GFR calc non Af Amer: 69 mL/min/{1.73_m2} (ref >59)
GLUCOSE: 93 mg/dL (ref 65–99)
Potassium: 4.5 mmol/L (ref 3.5–5.2)
Sodium: 142 mmol/L (ref 134–144)

## 2016-12-19 ENCOUNTER — Ambulatory Visit (HOSPITAL_COMMUNITY)
Admission: EM | Admit: 2016-12-19 | Discharge: 2016-12-19 | Disposition: A | Discharge status: Home / Self Care | Payer: Medicare Other | Attending: Emergency Medicine | Admitting: Emergency Medicine

## 2016-12-19 ENCOUNTER — Encounter (HOSPITAL_COMMUNITY): Payer: Self-pay | Admitting: Emergency Medicine

## 2016-12-19 DIAGNOSIS — J069 Acute upper respiratory infection, unspecified: Secondary | ICD-10-CM

## 2016-12-19 MED ORDER — AZITHROMYCIN 250 MG PO TABS: 250.0000 mg | ORAL_TABLET | Freq: Every day | ORAL | 0 refills | Status: AC
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
## 2016-12-19 NOTE — ED Triage Notes (Signed)
Pt c/o cold sx onset: 2 days  Sx include: prod cough, bilateral eye pain, chest congestion, nasal congestion, facial pressure, SOB  Denies: fevers   Taking: OTC cold meds w/temp relief.   Reports wife is being treated for flu  A&O x4... NAD

## 2016-12-22 ENCOUNTER — Encounter (HOSPITAL_COMMUNITY): Payer: Self-pay | Admitting: Emergency Medicine

## 2016-12-22 ENCOUNTER — Ambulatory Visit (INDEPENDENT_AMBULATORY_CARE_PROVIDER_SITE_OTHER): Payer: Medicare Other

## 2016-12-22 ENCOUNTER — Ambulatory Visit (HOSPITAL_COMMUNITY)
Admission: EM | Admit: 2016-12-22 | Discharge: 2016-12-22 | Disposition: A | Discharge status: Home / Self Care | Payer: Medicare Other | Attending: Family Medicine | Admitting: Family Medicine

## 2016-12-22 DIAGNOSIS — R059 Cough, unspecified: Secondary | ICD-10-CM

## 2016-12-22 DIAGNOSIS — R0602 Shortness of breath: Secondary | ICD-10-CM | POA: Diagnosis not present

## 2016-12-22 DIAGNOSIS — R05 Cough: Secondary | ICD-10-CM | POA: Diagnosis not present

## 2016-12-22 MED ORDER — PREDNISONE 10 MG (21) PO TBPK: ORAL_TABLET | Freq: Every day | ORAL | 0 refills | Status: DC
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
## 2016-12-23 ENCOUNTER — Ambulatory Visit: Payer: Medicare Other | Admitting: Internal Medicine

## 2016-12-29 DIAGNOSIS — Z23 Encounter for immunization: Secondary | ICD-10-CM | POA: Diagnosis not present

## 2017-01-12 DIAGNOSIS — H11151 Pinguecula, right eye: Secondary | ICD-10-CM | POA: Diagnosis not present

## 2017-01-12 DIAGNOSIS — H04123 Dry eye syndrome of bilateral lacrimal glands: Secondary | ICD-10-CM | POA: Diagnosis not present

## 2017-01-12 DIAGNOSIS — H2513 Age-related nuclear cataract, bilateral: Secondary | ICD-10-CM | POA: Diagnosis not present

## 2017-03-02 ENCOUNTER — Other Ambulatory Visit: Payer: Self-pay | Admitting: Family Medicine

## 2017-03-10 ENCOUNTER — Other Ambulatory Visit: Payer: Self-pay | Admitting: *Deleted

## 2017-03-10 MED ORDER — OLANZAPINE 2.5 MG PO TABS: ORAL_TABLET | ORAL | 4 refills | Status: DC

## 2017-03-13 ENCOUNTER — Ambulatory Visit: Payer: Medicare Other | Admitting: Family Medicine

## 2017-03-16 ENCOUNTER — Other Ambulatory Visit: Payer: Self-pay | Admitting: Family Medicine

## 2017-03-16 DIAGNOSIS — E785 Hyperlipidemia, unspecified: Secondary | ICD-10-CM

## 2017-03-16 DIAGNOSIS — K219 Gastro-esophageal reflux disease without esophagitis: Secondary | ICD-10-CM

## 2017-03-20 ENCOUNTER — Encounter: Payer: Self-pay | Admitting: Family Medicine

## 2017-03-20 ENCOUNTER — Ambulatory Visit (INDEPENDENT_AMBULATORY_CARE_PROVIDER_SITE_OTHER): Payer: Medicare Other | Admitting: Family Medicine

## 2017-03-20 DIAGNOSIS — E785 Hyperlipidemia, unspecified: Secondary | ICD-10-CM

## 2017-03-20 DIAGNOSIS — Z91018 Allergy to other foods: Secondary | ICD-10-CM

## 2017-03-20 DIAGNOSIS — J302 Other seasonal allergic rhinitis: Secondary | ICD-10-CM | POA: Diagnosis not present

## 2017-03-20 DIAGNOSIS — K219 Gastro-esophageal reflux disease without esophagitis: Secondary | ICD-10-CM | POA: Diagnosis not present

## 2017-03-20 MED ORDER — TRAZODONE HCL 50 MG PO TABS: 50.0000 mg | ORAL_TABLET | Freq: Every day | ORAL | 0 refills | Status: DC

## 2017-03-20 MED ORDER — OLANZAPINE 2.5 MG PO TABS: ORAL_TABLET | ORAL | 4 refills | Status: DC

## 2017-03-20 MED ORDER — CETIRIZINE HCL 10 MG PO TABS: 10.0000 mg | ORAL_TABLET | Freq: Every day | ORAL | 4 refills | Status: DC

## 2017-03-20 MED ORDER — EPINEPHRINE 0.3 MG/0.3ML IJ SOAJ: 0.3000 mg | INTRAMUSCULAR | 2 refills | Status: DC | PRN

## 2017-03-20 MED ORDER — FLUTICASONE PROPIONATE 50 MCG/ACT NA SUSP: 2.0000 | Freq: Every day | NASAL | 6 refills | Status: DC

## 2017-03-20 MED ORDER — TOPIRAMATE 200 MG PO TABS: 200.0000 mg | ORAL_TABLET | Freq: Two times a day (BID) | ORAL | 4 refills | Status: DC

## 2017-03-20 MED ORDER — ATORVASTATIN CALCIUM 40 MG PO TABS: 40.0000 mg | ORAL_TABLET | Freq: Every day | ORAL | 3 refills | Status: DC

## 2017-03-20 MED ORDER — DEXLANSOPRAZOLE 60 MG PO CPDR: 60.0000 mg | DELAYED_RELEASE_CAPSULE | Freq: Every day | ORAL | 3 refills | Status: DC

## 2017-03-20 MED ORDER — FLUOXETINE HCL 20 MG PO CAPS: ORAL_CAPSULE | ORAL | 4 refills | Status: DC

## 2017-03-20 NOTE — Assessment & Plan Note (Signed)
Refilled chronic meds

## 2017-03-20 NOTE — Assessment & Plan Note (Signed)
Refilled chronic meds, he has mild rhinorhea but no respiratory symptoms

## 2017-03-20 NOTE — Patient Instructions (Signed)
It was a pleasure to see you today! Thank you for choosing [facility] for your primary care. Gregory [name] was seen for establishing care after changing providers within clinic. Come back to the clinic if you have any new/worsening symptoms, and go to the emergency room if you have any life threatening concerns.  Today we talked about your medications to keep on track with your ongoing health plan.   We will attempt to prescribe 90days at a time to assist with cost.  Please let us know if you need anything else.  If we did any lab work today, and the results require attention, either me or my nurse will get in touch with you. If everything is normal, you will get a letter in mail and a message via . If you don't hear from [place] in two weeks, please give [place] a call. Otherwise, we look forward to seeing you again at your next visit. If you have any questions or concerns before then, please call the clinic at [phone].  Please bring all your medications to every doctors visit  Sign up for My Chart to have easy access to your labs results, and communication with your Primary care physician.    Please check-out at the front desk before leaving the clinic.    Best,  Dr. Sherene Sires FAMILY MEDICINE RESIDENT - PGY1 03/20/2017 11:10 AM

## 2017-03-20 NOTE — Assessment & Plan Note (Signed)
Patient does not own any epi pens but has needed them in the past for nut allergies (not in years).   Will reorder them and discussed with him the safety risk of not having epi available.

## 2017-03-20 NOTE — Assessment & Plan Note (Signed)
Refilled chronic meds, he states this is a stable/chronic condition with no new or worsening complaints
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
## 2017-03-26 DIAGNOSIS — M4722 Other spondylosis with radiculopathy, cervical region: Secondary | ICD-10-CM | POA: Diagnosis not present

## 2017-04-02 DIAGNOSIS — M542 Cervicalgia: Secondary | ICD-10-CM | POA: Diagnosis not present

## 2017-04-02 DIAGNOSIS — M4722 Other spondylosis with radiculopathy, cervical region: Secondary | ICD-10-CM | POA: Diagnosis not present

## 2017-04-07 DIAGNOSIS — Z6828 Body mass index (BMI) 28.0-28.9, adult: Secondary | ICD-10-CM | POA: Diagnosis not present

## 2017-04-07 DIAGNOSIS — M4722 Other spondylosis with radiculopathy, cervical region: Secondary | ICD-10-CM | POA: Diagnosis not present

## 2017-04-21 DIAGNOSIS — G5623 Lesion of ulnar nerve, bilateral upper limbs: Secondary | ICD-10-CM | POA: Diagnosis not present

## 2017-04-21 DIAGNOSIS — M5020 Other cervical disc displacement, unspecified cervical region: Secondary | ICD-10-CM | POA: Diagnosis not present

## 2017-04-21 DIAGNOSIS — G5603 Carpal tunnel syndrome, bilateral upper limbs: Secondary | ICD-10-CM | POA: Diagnosis not present

## 2017-04-23 ENCOUNTER — Other Ambulatory Visit: Payer: Self-pay | Admitting: Neurosurgery

## 2017-04-23 DIAGNOSIS — Z6830 Body mass index (BMI) 30.0-30.9, adult: Secondary | ICD-10-CM | POA: Diagnosis not present

## 2017-04-23 DIAGNOSIS — G5602 Carpal tunnel syndrome, left upper limb: Secondary | ICD-10-CM | POA: Diagnosis not present

## 2017-04-23 DIAGNOSIS — G5623 Lesion of ulnar nerve, bilateral upper limbs: Secondary | ICD-10-CM | POA: Diagnosis not present

## 2017-04-23 DIAGNOSIS — M5020 Other cervical disc displacement, unspecified cervical region: Secondary | ICD-10-CM | POA: Diagnosis not present

## 2017-04-23 DIAGNOSIS — R030 Elevated blood-pressure reading, without diagnosis of hypertension: Secondary | ICD-10-CM | POA: Diagnosis not present

## 2017-04-28 ENCOUNTER — Encounter (HOSPITAL_COMMUNITY): Payer: Self-pay | Admitting: *Deleted

## 2017-04-28 ENCOUNTER — Other Ambulatory Visit: Payer: Self-pay

## 2017-04-28 NOTE — Progress Notes (Signed)
Pt denies SOB, chest pain, and being under the care of a cardiologist. Pt denies having a cardiac cath. Pt made aware to stop taking Aspirin, vitamins, fish oil and herbal medications. Do not take any NSAIDs ie: Ibuprofen, Advil, Naproxen(Aleve), Motrin, BC and Goody Powder. Pt verbalized understanding of all pre-op instructions.

## 2017-04-29 ENCOUNTER — Ambulatory Visit (HOSPITAL_COMMUNITY)
Admission: RE | Admit: 2017-04-29 | Discharge: 2017-04-30 | Disposition: A | Discharge status: Home / Self Care | Payer: Medicare Other | Source: Ambulatory Visit | Attending: Neurosurgery | Admitting: Neurosurgery

## 2017-04-29 ENCOUNTER — Ambulatory Visit (HOSPITAL_COMMUNITY): Payer: Medicare Other | Admitting: Anesthesiology

## 2017-04-29 ENCOUNTER — Ambulatory Visit (HOSPITAL_COMMUNITY): Payer: Medicare Other

## 2017-04-29 ENCOUNTER — Encounter (HOSPITAL_COMMUNITY): Payer: Self-pay | Admitting: Anesthesiology

## 2017-04-29 ENCOUNTER — Ambulatory Visit (HOSPITAL_COMMUNITY)
Admission: RE | Disposition: A | Discharge status: Home / Self Care | Payer: Self-pay | Source: Ambulatory Visit | Attending: Neurosurgery

## 2017-04-29 DIAGNOSIS — K219 Gastro-esophageal reflux disease without esophagitis: Secondary | ICD-10-CM | POA: Insufficient documentation

## 2017-04-29 DIAGNOSIS — G4730 Sleep apnea, unspecified: Secondary | ICD-10-CM | POA: Insufficient documentation

## 2017-04-29 DIAGNOSIS — M5001 Cervical disc disorder with myelopathy,  high cervical region: Secondary | ICD-10-CM | POA: Diagnosis not present

## 2017-04-29 DIAGNOSIS — Z79899 Other long term (current) drug therapy: Secondary | ICD-10-CM | POA: Diagnosis not present

## 2017-04-29 DIAGNOSIS — F329 Major depressive disorder, single episode, unspecified: Secondary | ICD-10-CM | POA: Insufficient documentation

## 2017-04-29 DIAGNOSIS — Z87891 Personal history of nicotine dependence: Secondary | ICD-10-CM | POA: Diagnosis not present

## 2017-04-29 DIAGNOSIS — Z87892 Personal history of anaphylaxis: Secondary | ICD-10-CM | POA: Insufficient documentation

## 2017-04-29 DIAGNOSIS — G5622 Lesion of ulnar nerve, left upper limb: Secondary | ICD-10-CM | POA: Insufficient documentation

## 2017-04-29 DIAGNOSIS — G5602 Carpal tunnel syndrome, left upper limb: Secondary | ICD-10-CM | POA: Diagnosis not present

## 2017-04-29 DIAGNOSIS — G4733 Obstructive sleep apnea (adult) (pediatric): Secondary | ICD-10-CM | POA: Diagnosis not present

## 2017-04-29 DIAGNOSIS — Z7951 Long term (current) use of inhaled steroids: Secondary | ICD-10-CM | POA: Diagnosis not present

## 2017-04-29 DIAGNOSIS — M5082 Other cervical disc disorders, mid-cervical region, unspecified level: Secondary | ICD-10-CM | POA: Diagnosis not present

## 2017-04-29 DIAGNOSIS — Z419 Encounter for procedure for purposes other than remedying health state, unspecified: Secondary | ICD-10-CM

## 2017-04-29 DIAGNOSIS — Z885 Allergy status to narcotic agent status: Secondary | ICD-10-CM | POA: Diagnosis not present

## 2017-04-29 DIAGNOSIS — R2689 Other abnormalities of gait and mobility: Secondary | ICD-10-CM | POA: Insufficient documentation

## 2017-04-29 DIAGNOSIS — M5000 Cervical disc disorder with myelopathy, unspecified cervical region: Secondary | ICD-10-CM | POA: Diagnosis present

## 2017-04-29 DIAGNOSIS — Z981 Arthrodesis status: Secondary | ICD-10-CM | POA: Diagnosis not present

## 2017-04-29 DIAGNOSIS — Z472 Encounter for removal of internal fixation device: Secondary | ICD-10-CM | POA: Diagnosis not present

## 2017-04-29 DIAGNOSIS — M5021 Other cervical disc displacement,  high cervical region: Secondary | ICD-10-CM | POA: Diagnosis not present

## 2017-04-29 HISTORY — PX: ANTERIOR CERVICAL DECOMP/DISCECTOMY FUSION: SHX1161

## 2017-04-29 HISTORY — PX: CARPAL TUNNEL RELEASE: SHX101

## 2017-04-29 HISTORY — DX: Carpal tunnel syndrome, unspecified upper limb: G56.00

## 2017-04-29 HISTORY — DX: Major depressive disorder, single episode, unspecified: F32.9

## 2017-04-29 HISTORY — DX: Polyneuropathy, unspecified: G62.9

## 2017-04-29 HISTORY — DX: Depression, unspecified: F32.A

## 2017-04-29 HISTORY — PX: ULNAR NERVE TRANSPOSITION: SHX2595

## 2017-04-29 HISTORY — DX: Other cervical disc displacement, unspecified cervical region: M50.20

## 2017-04-29 LAB — CBC
HCT: 39.1 % (ref 39.0–52.0)
Hemoglobin: 13.3 g/dL (ref 13.0–17.0)
MCH: 30 pg (ref 26.0–34.0)
MCHC: 34 g/dL (ref 30.0–36.0)
MCV: 88.3 fL (ref 78.0–100.0)
Platelets: 257 K/uL (ref 150–400)
RBC: 4.43 MIL/uL (ref 4.22–5.81)
RDW: 13.3 % (ref 11.5–15.5)
WBC: 6 K/uL (ref 4.0–10.5)

## 2017-04-29 LAB — NO BLOOD PRODUCTS

## 2017-04-29 LAB — SURGICAL PCR SCREEN
MRSA, PCR: NEGATIVE
Staphylococcus aureus: NEGATIVE

## 2017-04-29 SURGERY — ANTERIOR CERVICAL DECOMPRESSION/DISCECTOMY FUSION 1 LEVEL
Anesthesia: General | Site: Wrist

## 2017-04-29 MED ORDER — ONDANSETRON HCL 4 MG/2ML IJ SOLN
INTRAMUSCULAR | Status: DC | PRN
  Administered 2017-04-29: 4 mg via INTRAVENOUS

## 2017-04-29 MED ORDER — ROCURONIUM BROMIDE 50 MG/5ML IV SOLN
INTRAVENOUS | Status: AC
  Filled 2017-04-29: qty 1

## 2017-04-29 MED ORDER — THROMBIN (RECOMBINANT) 5000 UNITS EX SOLR
OROMUCOSAL | Status: DC | PRN
  Administered 2017-04-29: 17:00:00 via TOPICAL

## 2017-04-29 MED ORDER — EPHEDRINE SULFATE-NACL 50-0.9 MG/10ML-% IV SOSY
PREFILLED_SYRINGE | INTRAVENOUS | Status: DC | PRN
  Administered 2017-04-29: 5 mg via INTRAVENOUS
  Administered 2017-04-29: 10 mg via INTRAVENOUS

## 2017-04-29 MED ORDER — DEXAMETHASONE 4 MG PO TABS: 4.0000 mg | ORAL_TABLET | Freq: Four times a day (QID) | ORAL | Status: DC

## 2017-04-29 MED ORDER — FENTANYL CITRATE (PF) 100 MCG/2ML IJ SOLN: 25.0000 ug | INTRAMUSCULAR | Status: DC | PRN

## 2017-04-29 MED ORDER — FLUOXETINE HCL 20 MG PO CAPS
20.0000 mg | ORAL_CAPSULE | Freq: Every day | ORAL | Status: DC
  Administered 2017-04-29: 20 mg via ORAL
  Filled 2017-04-29: qty 1

## 2017-04-29 MED ORDER — PANTOPRAZOLE SODIUM 40 MG PO TBEC
40.0000 mg | DELAYED_RELEASE_TABLET | Freq: Every day | ORAL | Status: DC
  Administered 2017-04-29: 40 mg via ORAL
  Filled 2017-04-29 (×2): qty 1

## 2017-04-29 MED ORDER — ACETAMINOPHEN 325 MG PO TABS: 650.0000 mg | ORAL_TABLET | ORAL | Status: DC | PRN

## 2017-04-29 MED ORDER — LACTATED RINGERS IV SOLN
INTRAVENOUS | Status: DC | PRN
  Administered 2017-04-29 (×2): via INTRAVENOUS

## 2017-04-29 MED ORDER — ONDANSETRON HCL 4 MG/2ML IJ SOLN
INTRAMUSCULAR | Status: AC
  Filled 2017-04-29: qty 2

## 2017-04-29 MED ORDER — FENTANYL CITRATE (PF) 100 MCG/2ML IJ SOLN
INTRAMUSCULAR | Status: DC | PRN
  Administered 2017-04-29 (×5): 50 ug via INTRAVENOUS
  Administered 2017-04-29: 100 ug via INTRAVENOUS

## 2017-04-29 MED ORDER — ROCURONIUM BROMIDE 100 MG/10ML IV SOLN
INTRAVENOUS | Status: DC | PRN
  Administered 2017-04-29: 50 mg via INTRAVENOUS
  Administered 2017-04-29 (×2): 20 mg via INTRAVENOUS
  Administered 2017-04-29: 10 mg via INTRAVENOUS
  Administered 2017-04-29: 20 mg via INTRAVENOUS

## 2017-04-29 MED ORDER — DOCUSATE SODIUM 100 MG PO CAPS
100.0000 mg | ORAL_CAPSULE | Freq: Two times a day (BID) | ORAL | Status: DC
  Administered 2017-04-29 – 2017-04-30 (×2): 100 mg via ORAL
  Filled 2017-04-29 (×2): qty 1

## 2017-04-29 MED ORDER — MAGNESIUM CITRATE PO SOLN
1.0000 | Freq: Once | ORAL | Status: DC | PRN
Start: 2017-04-29 — End: 2017-04-30

## 2017-04-29 MED ORDER — ACETAMINOPHEN 650 MG RE SUPP: 650.0000 mg | RECTAL | Status: DC | PRN

## 2017-04-29 MED ORDER — SENNOSIDES-DOCUSATE SODIUM 8.6-50 MG PO TABS: 1.0000 | ORAL_TABLET | Freq: Every evening | ORAL | Status: DC | PRN

## 2017-04-29 MED ORDER — BACITRACIN ZINC 500 UNIT/GM EX OINT
TOPICAL_OINTMENT | CUTANEOUS | Status: AC
  Filled 2017-04-29: qty 28.35

## 2017-04-29 MED ORDER — DEXAMETHASONE SODIUM PHOSPHATE 10 MG/ML IJ SOLN
INTRAMUSCULAR | Status: AC
  Filled 2017-04-29: qty 1

## 2017-04-29 MED ORDER — DEXAMETHASONE SODIUM PHOSPHATE 4 MG/ML IJ SOLN
4.0000 mg | Freq: Four times a day (QID) | INTRAMUSCULAR | Status: DC
  Administered 2017-04-29 – 2017-04-30 (×3): 4 mg via INTRAVENOUS
  Filled 2017-04-29 (×2): qty 1

## 2017-04-29 MED ORDER — CEFAZOLIN SODIUM-DEXTROSE 2-4 GM/100ML-% IV SOLN
2.0000 g | INTRAVENOUS | Status: AC
  Administered 2017-04-29: 2 g via INTRAVENOUS

## 2017-04-29 MED ORDER — PROPOFOL 10 MG/ML IV BOLUS
INTRAVENOUS | Status: AC
  Filled 2017-04-29: qty 20

## 2017-04-29 MED ORDER — GLYCOPYRROLATE 0.2 MG/ML IJ SOLN
INTRAMUSCULAR | Status: DC | PRN
  Administered 2017-04-29: 0.4 mg via INTRAVENOUS
  Administered 2017-04-29: 0.2 mg via INTRAVENOUS

## 2017-04-29 MED ORDER — CHLORHEXIDINE GLUCONATE CLOTH 2 % EX PADS: 6.0000 | MEDICATED_PAD | Freq: Once | CUTANEOUS | Status: DC

## 2017-04-29 MED ORDER — DIAZEPAM 5 MG PO TABS
5.0000 mg | ORAL_TABLET | Freq: Four times a day (QID) | ORAL | Status: DC | PRN
Start: 2017-04-29 — End: 2017-04-30

## 2017-04-29 MED ORDER — LIDOCAINE 2% (20 MG/ML) 5 ML SYRINGE
INTRAMUSCULAR | Status: DC | PRN
  Administered 2017-04-29: 60 mg via INTRAVENOUS

## 2017-04-29 MED ORDER — ZOLPIDEM TARTRATE 5 MG PO TABS: 5.0000 mg | ORAL_TABLET | Freq: Every evening | ORAL | Status: DC | PRN

## 2017-04-29 MED ORDER — POTASSIUM CHLORIDE IN NACL 20-0.9 MEQ/L-% IV SOLN: INTRAVENOUS | Status: DC

## 2017-04-29 MED ORDER — THROMBIN 5000 UNITS EX SOLR
CUTANEOUS | Status: AC
  Filled 2017-04-29: qty 5000

## 2017-04-29 MED ORDER — ATORVASTATIN CALCIUM 20 MG PO TABS
40.0000 mg | ORAL_TABLET | Freq: Every day | ORAL | Status: DC
  Administered 2017-04-29: 40 mg via ORAL
  Filled 2017-04-29 (×2): qty 2

## 2017-04-29 MED ORDER — PROMETHAZINE HCL 25 MG/ML IJ SOLN: 6.2500 mg | INTRAMUSCULAR | Status: DC | PRN

## 2017-04-29 MED ORDER — LIDOCAINE HCL (CARDIAC) 20 MG/ML IV SOLN
INTRAVENOUS | Status: AC
  Filled 2017-04-29: qty 5

## 2017-04-29 MED ORDER — LIDOCAINE-EPINEPHRINE 0.5 %-1:200000 IJ SOLN
INTRAMUSCULAR | Status: DC | PRN
  Administered 2017-04-29: 10 mL
  Administered 2017-04-29: 4 mL

## 2017-04-29 MED ORDER — TRAZODONE HCL 50 MG PO TABS
50.0000 mg | ORAL_TABLET | Freq: Every day | ORAL | Status: DC
  Administered 2017-04-29: 50 mg via ORAL
  Filled 2017-04-29 (×2): qty 1

## 2017-04-29 MED ORDER — BISACODYL 5 MG PO TBEC: 5.0000 mg | DELAYED_RELEASE_TABLET | Freq: Every day | ORAL | Status: DC | PRN

## 2017-04-29 MED ORDER — FENTANYL CITRATE (PF) 250 MCG/5ML IJ SOLN
INTRAMUSCULAR | Status: AC
  Filled 2017-04-29: qty 5

## 2017-04-29 MED ORDER — OLANZAPINE 2.5 MG PO TABS
2.5000 mg | ORAL_TABLET | Freq: Every day | ORAL | Status: DC
  Filled 2017-04-29 (×2): qty 1

## 2017-04-29 MED ORDER — PHENOL 1.4 % MT LIQD
1.0000 | OROMUCOSAL | Status: DC | PRN
  Filled 2017-04-29: qty 177

## 2017-04-29 MED ORDER — NEOSTIGMINE METHYLSULFATE 10 MG/10ML IV SOLN
INTRAVENOUS | Status: DC | PRN
  Administered 2017-04-29: 3 mg via INTRAVENOUS

## 2017-04-29 MED ORDER — DEXAMETHASONE SODIUM PHOSPHATE 4 MG/ML IJ SOLN
INTRAMUSCULAR | Status: DC | PRN
  Administered 2017-04-29: 10 mg via INTRAVENOUS

## 2017-04-29 MED ORDER — SODIUM BICARBONATE 8.4 % IV SOLN
INTRAVENOUS | Status: AC
  Filled 2017-04-29: qty 50

## 2017-04-29 MED ORDER — PHENYLEPHRINE HCL 10 MG/ML IJ SOLN
INTRAMUSCULAR | Status: DC | PRN
  Administered 2017-04-29: 50 ug/min via INTRAVENOUS

## 2017-04-29 MED ORDER — SUGAMMADEX SODIUM 200 MG/2ML IV SOLN
INTRAVENOUS | Status: AC
  Filled 2017-04-29: qty 2

## 2017-04-29 MED ORDER — 0.9 % SODIUM CHLORIDE (POUR BTL) OPTIME
TOPICAL | Status: DC | PRN
  Administered 2017-04-29: 1000 mL

## 2017-04-29 MED ORDER — LORATADINE 10 MG PO TABS
10.0000 mg | ORAL_TABLET | Freq: Every day | ORAL | Status: DC
  Filled 2017-04-29 (×2): qty 1

## 2017-04-29 MED ORDER — MIDAZOLAM HCL 5 MG/5ML IJ SOLN
INTRAMUSCULAR | Status: DC | PRN
  Administered 2017-04-29: 2 mg via INTRAVENOUS

## 2017-04-29 MED ORDER — EPINEPHRINE 0.3 MG/0.3ML IJ SOAJ
0.3000 mg | INTRAMUSCULAR | Status: DC | PRN
  Filled 2017-04-29: qty 0.3

## 2017-04-29 MED ORDER — HEMOSTATIC AGENTS (NO CHARGE) OPTIME
TOPICAL | Status: DC | PRN
  Administered 2017-04-29: 1 via TOPICAL

## 2017-04-29 MED ORDER — SODIUM CHLORIDE 0.9% FLUSH: 3.0000 mL | Freq: Two times a day (BID) | INTRAVENOUS | Status: DC

## 2017-04-29 MED ORDER — TOPIRAMATE 100 MG PO TABS
200.0000 mg | ORAL_TABLET | Freq: Two times a day (BID) | ORAL | Status: DC
  Filled 2017-04-29 (×3): qty 2

## 2017-04-29 MED ORDER — TRAMADOL HCL 50 MG PO TABS
50.0000 mg | ORAL_TABLET | ORAL | Status: DC | PRN
  Administered 2017-04-30: 100 mg via ORAL
  Administered 2017-04-30: 50 mg via ORAL
  Filled 2017-04-29: qty 1
  Filled 2017-04-29: qty 2

## 2017-04-29 MED ORDER — LACTATED RINGERS IV SOLN
INTRAVENOUS | Status: DC
  Administered 2017-04-29: 13:00:00 via INTRAVENOUS

## 2017-04-29 MED ORDER — MIDAZOLAM HCL 2 MG/2ML IJ SOLN
INTRAMUSCULAR | Status: AC
  Filled 2017-04-29: qty 2

## 2017-04-29 MED ORDER — SODIUM CHLORIDE 0.9% FLUSH: 3.0000 mL | INTRAVENOUS | Status: DC | PRN

## 2017-04-29 MED ORDER — LIDOCAINE-EPINEPHRINE 0.5 %-1:200000 IJ SOLN
INTRAMUSCULAR | Status: AC
  Filled 2017-04-29: qty 1

## 2017-04-29 MED ORDER — SODIUM CHLORIDE 0.9 % IV SOLN: 250.0000 mL | INTRAVENOUS | Status: DC

## 2017-04-29 MED ORDER — THROMBIN 5000 UNITS EX SOLR
CUTANEOUS | Status: AC
  Filled 2017-04-29: qty 10000

## 2017-04-29 MED ORDER — BACITRACIN ZINC 500 UNIT/GM EX OINT
TOPICAL_OINTMENT | CUTANEOUS | Status: DC | PRN
  Administered 2017-04-29: 1 via TOPICAL

## 2017-04-29 MED ORDER — ONDANSETRON HCL 4 MG PO TABS: 4.0000 mg | ORAL_TABLET | Freq: Four times a day (QID) | ORAL | Status: DC | PRN

## 2017-04-29 MED ORDER — THROMBIN 5000 UNITS EX SOLR
CUTANEOUS | Status: DC | PRN
  Administered 2017-04-29 (×2): 5000 [IU] via TOPICAL

## 2017-04-29 MED ORDER — FLUTICASONE PROPIONATE 50 MCG/ACT NA SUSP
2.0000 | Freq: Every day | NASAL | Status: DC | PRN
  Filled 2017-04-29: qty 16

## 2017-04-29 MED ORDER — MENTHOL 3 MG MT LOZG
1.0000 | LOZENGE | OROMUCOSAL | Status: DC | PRN
  Filled 2017-04-29: qty 9

## 2017-04-29 MED ORDER — CELECOXIB 200 MG PO CAPS
200.0000 mg | ORAL_CAPSULE | Freq: Two times a day (BID) | ORAL | Status: DC
  Administered 2017-04-29 – 2017-04-30 (×2): 200 mg via ORAL
  Filled 2017-04-29 (×2): qty 1

## 2017-04-29 MED ORDER — EPHEDRINE 5 MG/ML INJ
INTRAVENOUS | Status: AC
  Filled 2017-04-29: qty 10

## 2017-04-29 MED ORDER — PROPOFOL 10 MG/ML IV BOLUS
INTRAVENOUS | Status: DC | PRN
  Administered 2017-04-29: 150 mg via INTRAVENOUS

## 2017-04-29 MED ORDER — GABAPENTIN 300 MG PO CAPS
300.0000 mg | ORAL_CAPSULE | Freq: Three times a day (TID) | ORAL | Status: DC
  Administered 2017-04-29 – 2017-04-30 (×2): 300 mg via ORAL
  Filled 2017-04-29 (×2): qty 1

## 2017-04-29 MED ORDER — ONDANSETRON HCL 4 MG/2ML IJ SOLN: 4.0000 mg | Freq: Four times a day (QID) | INTRAMUSCULAR | Status: DC | PRN

## 2017-04-29 SURGICAL SUPPLY — 83 items
ADH SKN CLS APL DERMABOND .7 (GAUZE/BANDAGES/DRESSINGS) ×6
BANDAGE ACE 3X5.8 VEL STRL LF (GAUZE/BANDAGES/DRESSINGS) ×7 IMPLANT
BLADE CLIPPER SURG (BLADE) IMPLANT
BLADE SURG 10 STRL SS (BLADE) ×4 IMPLANT
BLADE SURG 15 STRL LF DISP TIS (BLADE) ×6 IMPLANT
BLADE SURG 15 STRL SS (BLADE) ×8
BNDG GAUZE ELAST 4 BULKY (GAUZE/BANDAGES/DRESSINGS) ×4 IMPLANT
BUR DRUM 4.0 (BURR) ×4 IMPLANT
BUR MATCHSTICK NEURO 3.0 LAGG (BURR) ×4 IMPLANT
CABLE BIPOLOR RESECTION CORD (MISCELLANEOUS) ×7 IMPLANT
CANISTER SUCT 3000ML PPV (MISCELLANEOUS) ×4 IMPLANT
CARTRIDGE OIL MAESTRO DRILL (MISCELLANEOUS) ×3 IMPLANT
DECANTER SPIKE VIAL GLASS SM (MISCELLANEOUS) ×8 IMPLANT
DERMABOND ADVANCED (GAUZE/BANDAGES/DRESSINGS) ×2
DERMABOND ADVANCED .7 DNX12 (GAUZE/BANDAGES/DRESSINGS) ×4 IMPLANT
DIFFUSER DRILL AIR PNEUMATIC (MISCELLANEOUS) ×4 IMPLANT
DRAPE EXTREMITY T 121X128X90 (DRAPE) ×7 IMPLANT
DRAPE HALF SHEET 40X57 (DRAPES) ×5 IMPLANT
DRAPE LAPAROTOMY 100X72 PEDS (DRAPES) ×4 IMPLANT
DRAPE MICROSCOPE LEICA (MISCELLANEOUS) ×4 IMPLANT
DRAPE POUCH INSTRU U-SHP 10X18 (DRAPES) ×4 IMPLANT
DURAPREP 26ML APPLICATOR (WOUND CARE) ×8 IMPLANT
DURAPREP 6ML APPLICATOR 50/CS (WOUND CARE) ×4 IMPLANT
ELECT CAUTERY BLADE 6.4 (BLADE) ×4 IMPLANT
ELECT COATED BLADE 2.86 ST (ELECTRODE) ×4 IMPLANT
ELECT REM PT RETURN 9FT ADLT (ELECTROSURGICAL) ×4
ELECTRODE REM PT RTRN 9FT ADLT (ELECTROSURGICAL) ×3 IMPLANT
GAUZE SPONGE 4X4 12PLY STRL (GAUZE/BANDAGES/DRESSINGS) ×6 IMPLANT
GAUZE SPONGE 4X4 16PLY XRAY LF (GAUZE/BANDAGES/DRESSINGS) ×7 IMPLANT
GLOVE BIOGEL PI IND STRL 6.5 (GLOVE) IMPLANT
GLOVE BIOGEL PI IND STRL 7.0 (GLOVE) IMPLANT
GLOVE BIOGEL PI IND STRL 7.5 (GLOVE) IMPLANT
GLOVE BIOGEL PI INDICATOR 6.5 (GLOVE) ×1
GLOVE BIOGEL PI INDICATOR 7.0 (GLOVE) ×3
GLOVE BIOGEL PI INDICATOR 7.5 (GLOVE) ×2
GLOVE ECLIPSE 6.5 STRL STRAW (GLOVE) ×9 IMPLANT
GLOVE EXAM NITRILE LRG STRL (GLOVE) IMPLANT
GLOVE EXAM NITRILE XL STR (GLOVE) IMPLANT
GLOVE EXAM NITRILE XS STR PU (GLOVE) IMPLANT
GLOVE SURG SS PI 6.0 STRL IVOR (GLOVE) ×2 IMPLANT
GOWN STRL REUS W/ TWL LRG LVL3 (GOWN DISPOSABLE) ×18 IMPLANT
GOWN STRL REUS W/ TWL XL LVL3 (GOWN DISPOSABLE) ×3 IMPLANT
GOWN STRL REUS W/TWL 2XL LVL3 (GOWN DISPOSABLE) IMPLANT
GOWN STRL REUS W/TWL LRG LVL3 (GOWN DISPOSABLE) ×12
GOWN STRL REUS W/TWL XL LVL3 (GOWN DISPOSABLE) ×8
HEMOSTAT POWDER KIT SURGIFOAM (HEMOSTASIS) ×1 IMPLANT
KIT BASIN OR (CUSTOM PROCEDURE TRAY) ×8 IMPLANT
KIT ROOM TURNOVER OR (KITS) ×8 IMPLANT
LOCATOR NERVE 3 VOLT (DISPOSABLE) ×3 IMPLANT
NDL HYPO 25X1 1.5 SAFETY (NEEDLE) ×9 IMPLANT
NDL SPNL 22GX3.5 QUINCKE BK (NEEDLE) ×2 IMPLANT
NEEDLE HYPO 25X1 1.5 SAFETY (NEEDLE) ×8 IMPLANT
NEEDLE SPNL 22GX3.5 QUINCKE BK (NEEDLE) ×4 IMPLANT
NS IRRIG 1000ML POUR BTL (IV SOLUTION) ×8 IMPLANT
OIL CARTRIDGE MAESTRO DRILL (MISCELLANEOUS) ×4
PACK LAMINECTOMY NEURO (CUSTOM PROCEDURE TRAY) ×4 IMPLANT
PACK SURGICAL SETUP 50X90 (CUSTOM PROCEDURE TRAY) ×7 IMPLANT
PAD ARMBOARD 7.5X6 YLW CONV (MISCELLANEOUS) ×30 IMPLANT
PENCIL BUTTON HOLSTER BLD 10FT (ELECTRODE) ×4 IMPLANT
PIN DISTRACTION 14MM (PIN) IMPLANT
PLATE VECTRA 16MM (Plate) ×1 IMPLANT
RUBBERBAND STERILE (MISCELLANEOUS) ×8 IMPLANT
SCREW 4.5X16MM (Screw) ×8 IMPLANT
SCREW BN 16X4.5XSLF DRL VA (Screw) IMPLANT
SCREW SELF TAP 4.0X14MM (Screw) ×2 IMPLANT
SPACER PARALLEL 6MM CC ACF (Bone Implant) ×1 IMPLANT
SPONGE INTESTINAL PEANUT (DISPOSABLE) ×8 IMPLANT
SPONGE SURGIFOAM ABS GEL SZ50 (HEMOSTASIS) ×4 IMPLANT
STOCKINETTE 4X48 STRL (DRAPES) ×7 IMPLANT
SUT ETHILON 3 0 PS 1 (SUTURE) ×7 IMPLANT
SUT SILK 3 0 SH 30 (SUTURE) ×1 IMPLANT
SUT VIC AB 0 CT1 27 (SUTURE) ×4
SUT VIC AB 0 CT1 27XBRD ANTBC (SUTURE) IMPLANT
SUT VIC AB 2-0 CT2 18 VCP726D (SUTURE) ×4 IMPLANT
SUT VIC AB 3-0 SH 8-18 (SUTURE) ×7 IMPLANT
SYR BULB 3OZ (MISCELLANEOUS) ×6 IMPLANT
SYR CONTROL 10ML LL (SYRINGE) ×6 IMPLANT
TOWEL GREEN STERILE (TOWEL DISPOSABLE) ×10 IMPLANT
TOWEL GREEN STERILE FF (TOWEL DISPOSABLE) ×10 IMPLANT
TRAY CATH 16FR W/PLASTIC CATH (SET/KITS/TRAYS/PACK) ×1 IMPLANT
TUBE CONNECTING 12X1/4 (SUCTIONS) ×7 IMPLANT
UNDERPAD 30X30 (UNDERPADS AND DIAPERS) ×7 IMPLANT
WATER STERILE IRR 1000ML POUR (IV SOLUTION) ×10 IMPLANT

## 2017-04-29 NOTE — Anesthesia Procedure Notes (Signed)
Procedure Name: Intubation Date/Time: 04/29/2017 2:09 PM Performed by: Eulas Post, Pressley Tadesse W, CRNA Pre-anesthesia Checklist: Patient identified, Emergency Drugs available, Suction available and Patient being monitored Patient Re-evaluated:Patient Re-evaluated prior to induction Oxygen Delivery Method: Circle system utilized Preoxygenation: Pre-oxygenation with 100% oxygen Induction Type: IV induction Ventilation: Mask ventilation without difficulty Grade View: Grade I Tube type: Oral Tube size: 8.0 mm Number of attempts: 1 Airway Equipment and Method: Stylet and Video-laryngoscopy Placement Confirmation: ETT inserted through vocal cords under direct vision,  positive ETCO2 and breath sounds checked- equal and bilateral Secured at: 21 cm Tube secured with: Tape Dental Injury: Teeth and Oropharynx as per pre-operative assessment  Difficulty Due To: Difficulty was anticipated and Difficult Airway- due to reduced neck mobility Comments: Elective glide scope due to neck pain and arm numbness. C-Spine neutral for induction and intubation.

## 2017-04-29 NOTE — Anesthesia Preprocedure Evaluation (Signed)
Anesthesia Evaluation  Patient identified by MRN, date of birth, ID band Patient awake    Reviewed: Allergy & Precautions, H&P , NPO status , Patient's Chart, lab work & pertinent test results  Airway Mallampati: II  TM Distance: >3 FB Neck ROM: Full    Dental no notable dental hx.    Pulmonary neg pulmonary ROS, sleep apnea , former smoker,    Pulmonary exam normal breath sounds clear to auscultation       Cardiovascular hypertension, Pt. on medications Normal cardiovascular exam Rhythm:Regular Rate:Normal     Neuro/Psych  Headaches, PSYCHIATRIC DISORDERS Depression Bipolar Disorder    GI/Hepatic Neg liver ROS, GERD  Medicated and Controlled,  Endo/Other  negative endocrine ROS  Renal/GU negative Renal ROS     Musculoskeletal  (+) Arthritis ,   Abdominal   Peds negative pediatric ROS (+)  Hematology negative hematology ROS (+) REFUSES BLOOD PRODUCTS, JEHOVAH'S WITNESS  Anesthesia Other Findings   Reproductive/Obstetrics negative OB ROS                             Anesthesia Physical  Anesthesia Plan  ASA: II  Anesthesia Plan: General   Post-op Pain Management:    Induction: Intravenous  PONV Risk Score and Plan: 3  Airway Management Planned: Oral ETT  Additional Equipment:   Intra-op Plan:   Post-operative Plan: Extubation in OR  Informed Consent: I have reviewed the patients History and Physical, chart, labs and discussed the procedure including the risks, benefits and alternatives for the proposed anesthesia with the patient or authorized representative who has indicated his/her understanding and acceptance.   Dental advisory given  Plan Discussed with: CRNA  Anesthesia Plan Comments:        Anesthesia Quick Evaluation
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
## 2017-04-29 NOTE — Op Note (Signed)
04/29/2017  6:25 PM  PATIENT:  Gregory [name]  57 y.o. male with severe carpal tunnel disease, a large central herniated disc at C2/3, and severe left ulnar neuropathy. He is admitted for operative decompression of each.   PRE-OPERATIVE DIAGNOSIS:  HERNIATED NUCLEUS PULPOSUS C2/3, ULNAR NEUROPATHY, CARPAL TUNNEL SYNDROME  POST-OPERATIVE DIAGNOSIS:  HERNIATED NUCLEUS PULPOSUS C2/3,ULNAR NEUROPATHY,CARPAL TUNNEL SYNDROME  PROCEDURE:  Anterior Cervical decompression C2/3 Arthrodesis C2/3 with 107mm structural allograft Anterior instrumentation(Synthes Vectra) C2/3 Removal C3/4 plate and screws Left ulnar nerve decompression Left carpal tunnel release  SURGEON:   Surgeon(s): Ashok Pall, MD Jovita Gamma, MD   ASSISTANTS:Nudelman, Herbie Baltimore  ANESTHESIA:   general  EBL:  Total I/O In: 1500 [I.V.:1400; IV Piggyback:100] Out: 149 [Urine:700; Blood:75]  BLOOD ADMINISTERED:none  CELL SAVER GIVEN:none  COUNT:per nursing  DRAINS: none   SPECIMEN:  No Specimen  SURGEON: Surgeon(s): Ashok Pall, MD Jovita Gamma, MD   EBL:  Total I/O In: 1500 [I.V.:1400; IV Piggyback:100] Out: 702 [Urine:700; Blood:75]  COUNT:per nursing  DICTATION: Gregory [name] was taken to the operating room, given IV sedation, and positioned on the operating room table. male had their Left upper extremity prepped and draped in a sterile manner. I infiltrated Licodcaine  6/3% 7/858,850 strength epinephrine into the planned incision starting at the proximal palmar crease extending into the hand ~ 1.5cm. I opened the skin with a 15 blade and extended the incision through the skin into the subcutaneous tissue. I used the bipolar cautery to control the subcutaneous bleeding. I dissected sharply through the tissue using forceps also to expose the transverse carpal ligament. I divided the transverse carpal ligament sharply with the 15 blade using the forceps to protect the contents of the carpal tunnel. With  the scissors I divided the ligament both proximally and distally to decompress the entire carpal tunnel. I used the scissors to dissect into the forearm to create space to divide the ligament to the proximal palmar crease, and distally into the palm.  I irrigated the wound then closed the incision with vertical interrupted vertical mattress sutures. I placed a sterile dressing, then wrapped the proximal hand and distal forearm with an ace wrap. I turned my attention to the Ulnar nerve decompression. I made a semicircle incision to expose the medial and lateal condyles. I used scissors to dissect through the subcutaneous tissue. I then used the scissors to expose then open the cubital tunnel. I used a nerve stimulator to confirm the nerve was exposed. I then transected the flexor carpi ulnaris proximally for distal decompression, and proximally through the tunnel. I was satisfied with the decompression, and closed the wound with vicryl sutures. I applied dermabond for a sterile dressing. We then took down the drapes, and he was positioned supine with his head in slight extension on a horseshoe headrest. The neck was prepped and draped in a sterile manner. I infiltrated 5 cc's 1/2%lidocaine/1:200,000 strength epinephrine into the planned incision starting from the midline to the medial border of the left sternocleidomastoid muscle. I opened the incision with a 10 blade and dissected sharply through soft tissue to the platysma. I dissected in the plane superior to the platysma both rostrally and caudally. I then opened the platysma in a horizontal fashion with Metzenbaum scissors, and dissected in the inferior plane rostrally and caudally. With both blunt and sharp technique I created an avascular corridor to the cervical spine. I identified the hardware at C3/4. We used the curette, monopolar cautery, to fully expose the  plate. We then removed the screws, and the plate.  I then reflected the longus colli from C2 to  C3 and placed self retaining retractors. I opened the disc space(s) at C2/3 with a 15 blade. We removed disc with curettes, Kerrison punches, and the drill. Using the drill I removed osteophytes and prepared for the decompression.  We decompressed the spinal canal and the C3 root(s) with the drill, Kerrison punches, and the curettes. I used the microscope to aid in microdissection. I removed the posterior longitudinal ligament to fully expose and decompress the thecal sac. With the decompression complete we moved on to the arthrodesis. I used the drill to level the surfaces of C2,3. I removed soft tissue to prepare the disc space and the bony surfaces. I measured the space and placed a 97mm structural allograft into the disc space.  We then placed the anterior instrumentation. I placed 2 screws in each vertebral body through the plate. I locked the screws into place. Intraoperative xray showed the graft, plate, and screws to be in good position. I irrigated the wound, achieved hemostasis, and closed the wound in layers. I approximated the platysma, and the subcuticular plane with vicryl sutures. I used Dermabond for a sterile dressing.   PLAN OF CARE: Admit for overnight observation  PATIENT DISPOSITION:  PACU - hemodynamically stable.   Delay start of Pharmacological VTE agent (>24hrs) due to surgical blood loss or risk of bleeding:  yes

## 2017-04-29 NOTE — Progress Notes (Signed)
Carpal Tunnel Release, Ulnar Nerve Decompression left

## 2017-04-29 NOTE — Transfer of Care (Signed)
Immediate Anesthesia Transfer of Care Note  [name]  Procedure(s) Performed: REMOVAL CERVICAL THREE-FOUR PLATE, ANTERIOR CERVICAL DECOMPRESSION/DISCECTOMY FUSION CERVICAL TWO- CERVICAL THREE (N/A Spine Cervical) ULNAR NERVE DECOMPRESSION/TRANSPOSITION (Left Arm Lower) CARPAL TUNNEL RELEASE (Left Wrist)  Patient Location: PACU  Anesthesia Type:General  Level of Consciousness: awake, alert , oriented and patient cooperative  Airway & Oxygen Therapy: Patient Spontanous Breathing and Patient connected to nasal cannula oxygen  Post-op Assessment: Report given to RN and Post -op Vital signs reviewed and stable  Post vital signs: Reviewed and stable  Last Vitals:  Vitals:   04/29/17 1134 04/29/17 1818  BP: (!) 146/92   Pulse: (!) 50   Resp: 18 (P) 16  Temp: (!) 36.3 C (P) 37.1 C  SpO2: 99%     Last Pain:  Vitals:   04/29/17 1303  TempSrc:   PainSc: 8       Patients Stated Pain Goal: 3 (62/37/62 8315)  Complications: No apparent anesthesia complications

## 2017-04-29 NOTE — Anesthesia Postprocedure Evaluation (Signed)
Anesthesia Post Note  [name]  Procedure(s) Performed: REMOVAL CERVICAL THREE-FOUR PLATE, ANTERIOR CERVICAL DECOMPRESSION/DISCECTOMY FUSION CERVICAL TWO- CERVICAL THREE (N/A Spine Cervical) ULNAR NERVE DECOMPRESSION/TRANSPOSITION (Left Arm Lower) CARPAL TUNNEL RELEASE (Left Wrist)     Patient location during evaluation: PACU Anesthesia Type: General Level of consciousness: sedated and patient cooperative Pain management: pain level controlled Vital Signs Assessment: post-procedure vital signs reviewed and stable Respiratory status: spontaneous breathing Cardiovascular status: stable Anesthetic complications: no    Last Vitals:  Vitals:   04/29/17 1830 04/29/17 1845  BP: (!) 159/84 (!) 155/84  Pulse: 77 85  Resp: 14 11  Temp:    SpO2: 97% 92%    Last Pain:  Vitals:   04/29/17 1818  TempSrc:   PainSc: [place]

## 2017-04-30 ENCOUNTER — Encounter (HOSPITAL_COMMUNITY): Payer: Self-pay | Admitting: Neurosurgery

## 2017-04-30 DIAGNOSIS — G5622 Lesion of ulnar nerve, left upper limb: Secondary | ICD-10-CM | POA: Diagnosis not present

## 2017-04-30 DIAGNOSIS — F329 Major depressive disorder, single episode, unspecified: Secondary | ICD-10-CM | POA: Diagnosis not present

## 2017-04-30 DIAGNOSIS — R2689 Other abnormalities of gait and mobility: Secondary | ICD-10-CM | POA: Diagnosis not present

## 2017-04-30 DIAGNOSIS — G5602 Carpal tunnel syndrome, left upper limb: Secondary | ICD-10-CM | POA: Diagnosis not present

## 2017-04-30 DIAGNOSIS — M5001 Cervical disc disorder with myelopathy,  high cervical region: Secondary | ICD-10-CM | POA: Diagnosis not present

## 2017-04-30 DIAGNOSIS — Z87891 Personal history of nicotine dependence: Secondary | ICD-10-CM | POA: Diagnosis not present

## 2017-04-30 MED ORDER — TIZANIDINE HCL 4 MG PO TABS: 4.0000 mg | ORAL_TABLET | Freq: Four times a day (QID) | ORAL | 0 refills | Status: DC | PRN

## 2017-04-30 MED ORDER — DEXAMETHASONE 4 MG PO TABS: 4.0000 mg | ORAL_TABLET | Freq: Two times a day (BID) | ORAL | 0 refills | Status: DC

## 2017-04-30 MED FILL — Thrombin For Soln 5000 Unit: CUTANEOUS | Qty: 5000 | Status: AC

## 2017-04-30 NOTE — Discharge Instructions (Addendum)
Wound Care Leave incision open to air. You may shower. Do not scrub directly on incision.  Do not put any creams, lotions, or ointments on incision. Activity Walk each and every day, increasing distance each day. No lifting greater than 5 lbs.  Avoid excessive neck motion. No driving for 2 weeks; may ride as a passenger locally. Wear neck brace at all times except when showering.  If provided soft collar, may wear for comfort unless otherwise instructed. Diet Resume your normal diet.  Return to Work Will be discussed at you follow up appointment. Call Your Doctor If Any of These Occur Redness, drainage, or swelling at the wound.  Temperature greater than 101 degrees. Severe pain not relieved by pain medication. Increased difficulty swallowing. Incision starts to come apart. Follow Up Appt Call today for appointment in 3 weeks ([phone]) or for problems.  If you have any hardware placed in your spine, you will need an x-ray before your appointment.          Anterior Cervical Fusion Care After Pinching of the nerves is a common cause of long-term pain. When this happens, a procedure called an anterior cervical fusion is sometimes performed. It relieves the pressure on the pinched nerve roots or spinal cord in the neck. An anterior cervical fusion means that the operation is done through the front (anterior) of your neck to fuse bones in your neck together. This procedure is done to relieve the pressure on pinched nerve roots or spinal cord. This operation is done to control the movement of your spine, which may be pressing on the nerves. This may relieve the pain. The procedure that stops the movement of the spine is called a fusion. The cut by the surgeon (incision) is usually within a skin fold line under your chin. After moving the neck muscles gently apart, the neurosurgeon uses an operating microscope and removes the injured intervertebral disk (the cushion or pad of tissue  between the bones of the spine). This takes the pressure off the nerves or spinal cord. This is called decompression. The area where the disc was removed is then filled with a bone graft. The graft will fuse the vertebrae together over time. This means it causes the vertebral bodies to grow together. The bone graft may be obtained from your own bone (your hip for example), or may be obtained from a bone bank. Receiving bone from a bone bank is similar to a blood bank, only the bone comes from human donors who have recently died. This type of graft is referred to as allograft bone. The preformed bone plug is safe and will not be rejected by your body. It does not contain blood cells. In some cases, the surgeon may use hardware in your neck to help stabilize it. This means that metal plates or pins or screws may be used to:  Provide extra support to the neck.   Help the bones to grow together more easily.  A cervical fusion procedure takes a couple hours to several hours, depending on what needs to be done. Your caregiver will be able to answer your questions for you. HOME CARE INSTRUCTIONS   It will be normal to have a sore throat and have difficulty swallowing foods for a couple weeks following surgery. See your caregiver if this seems to be getting worse rather than better.   You may resume normal diet and activities as directed or allowed. Generally, walking and stair climbing are fine. Avoid lifting more than  ten pounds and do no lifting above your head.   If given a cervical collar, remove only for bathing and eating, or as directed.   Use only showers for cleaning up, with no bathing, until seen.   You may apply ice to the surgical or bone donor site for 15 to 20 minutes each hour while awake for the first couple days following surgery. Put the ice in a plastic bag and place a towel between the bag of ice and your skin.   Change dressings if necessary or as directed.   You may drive in 10  days   Take prescribed medication as directed. Only take over-the-counter or prescription medicines for pain, discomfort, or fever as directed by your caregiver.   Make an appointment to see your caregiver for suture or staple removal when instructed.   If physical therapy was prescribed, follow your caregiver's directions.  SEEK IMMEDIATE MEDICAL CARE IF:  There is redness, swelling, or increasing pain in the wound.   There is pus coming from the wound.   An unexplained oral temperature over 102 F (38.9 C) develops.   There is a bad smell coming from the wound or dressing.   You have swelling in your calf or leg.   You develop shortness of breath or chest pain.   The wound edges break open after sutures or staples have been removed.   Your pain is not controlled with medicine.   You seem to be getting worse rather than better.  Carpal Tunnel Release (Repair)  Care After    Refer to this sheet in the next few weeks. These discharge instructions provide you with general information on caring for yourself after you leave the hospital. Your caregiver may also give you specific instructions. Your treatment has been planned according to the most current medical practices available, but unavoidable complications sometimes occur. If you have any problems or questions after discharge, please call your caregiver.  HOME CARE INSTRUCTIONS  Have a responsible person with you for 24 hours.  Do not drive a car or take public transportation for 24 hours.  Only take over-the-counter or prescription medicines for pain, discomfort, or fever as directed by your caregiver. Take them as directed.  You may put ice on the palm side of the affected wrist.  Put ice in a plastic bag.  Place a towel between your skin and the bag.  Leave the ice on for 15 to 20 minutes, 3 to 4 times per day.  Remove the Ace bandage approximately 6pm the day of surgery Keep your hand raised (elevated) above the level of  your heart as much as possible. This keeps swelling down and helps with discomfort.  Change bandages (dressings) as directed.  Keep the wound clean and dry. SEEK MEDICAL CARE IF:  You develop pain not relieved with medicines.  You develop numbness of your hand.  You develop bleeding from your surgical site.  You have an oral temperature above 102 F (38.9 C).  You develop redness or swelling of the surgical site.  You develop new, unexplained problems. SEEK IMMEDIATE MEDICAL CARE IF:  You develop a rash.  You have difficulty breathing.  You develop any reaction or side effects to medicines given. MAKE SURE YOU:  Understand these instructions.  Will watch your condition.  Will get help right away if you are not doing well or get worse.

## 2017-04-30 NOTE — Discharge Summary (Signed)
Physician Discharge Summary  Patient ID: Gregory [name] MRN: [id number] DOB/AGE: 1961/02/02 57 y.o.  Admit date: 04/29/2017 Discharge date: 04/30/2017  Admission Diagnoses: left carpal tunnel syndrome, left cubital tunnel syndrome, HNP C2/3  Discharge Diagnoses: same Active Problems:   HNP (herniated nucleus pulposus) with myelopathy, cervical   Discharged Condition: good  Hospital Course: [name] was admitted and taken to the operating room for a left carpal tunnel release, a left cubital tunnel release, and an ACDF at C2/3, and removal of the C3/4 plate. Post op he is ambulating, voiding, has normal strength in the upper extremities, has a normal speaking voice, and is tolerating a regular diet. His wound is clean, dry, and without signs of infection Treatments: surgery: as above, Synthes vectra plate  Discharge Exam: Blood pressure 134/89, pulse 78, temperature 97.8 F (36.6 C), resp. rate 16, height 5\' 7"  (1.702 m), weight 86.2 kg (190 lb), SpO2 94 %. General appearance: alert, cooperative, appears stated age and no distress  Disposition: 01-Home or Self Care HERNIATED NUCLEUS PULPOSUS, ULNAR NEUROPATHY, CARPAL TUNNEL SYNDROME  Allergies as of 04/30/2017      Reactions   Peanut-containing Drug Products Anaphylaxis   Other    BLOOD PRODUCT REFUSAL   Demerol [meperidine] Rash   Hydromorphone Hcl Itching, Rash   Meperidine Hcl Itching, Rash   delusional   Morphine Itching, Rash      Medication List    TAKE these medications   atorvastatin 40 MG tablet Commonly known as:  LIPITOR Take 1 tablet (40 mg total) by mouth daily.   cetirizine 10 MG tablet Commonly known as:  ZYRTEC Take 1 tablet (10 mg total) by mouth daily. What changed:    when to take this  reasons to take this   dexlansoprazole 60 MG capsule Commonly known as:  DEXILANT Take 1 capsule (60 mg total) by mouth daily.   EPINEPHrine 0.3 mg/0.3 mL Soaj injection Commonly known as:  EPI-PEN Inject  0.3 mLs (0.3 mg total) into the muscle as needed.   FLUoxetine 20 MG capsule Commonly known as:  PROZAC TAKE 1 CAPSULE (20 MG TOTAL) BY MOUTH AT BEDTIME. ONE AT BEDTIME   fluticasone 50 MCG/ACT nasal spray Commonly known as:  FLONASE Place 2 sprays into both nostrils daily. What changed:    when to take this  reasons to take this   OLANZapine 2.5 MG tablet Commonly known as:  ZYPREXA TAKE 1 TABLET BY MOUTH EVERY NIGHT FOR MOOD.   sildenafil 20 MG tablet Commonly known as:  REVATIO Take 1 tablet (20 mg total) by mouth daily as needed.   topiramate 200 MG tablet Commonly known as:  TOPAMAX Take 1 tablet (200 mg total) by mouth 2 (two) times daily. What changed:    when to take this  reasons to take this   traZODone 50 MG tablet Commonly known as:  DESYREL Take 1 tablet (50 mg total) by mouth at bedtime.      Follow-up Information    Ashok Pall, MD Follow up in 3 week(s).   Specialty:  Neurosurgery Why:  please call the office to make an appointment to remove the sutures Contact information: [address] [place] [address] [phone]           Signed: Winfield Cunas 04/30/2017, 10:47 AM

## 2017-04-30 NOTE — Evaluation (Signed)
Physical Therapy Evaluation Patient Details Name: Gregory [name] MRN: [id number] DOB: Mar 26, 1960 Today's Date: 04/30/2017   History of Present Illness  Gregory [name]  57 y.o. male with severe carpal tunnel disease, a large central herniated disc at C2/3, and severe left ulnar neuropathy. He is admitted for operative decompression of each.  Now s/p ACDF C2-3, L carpal tunnel release and L ulnar nerve decompression and transposition.   Clinical Impression  Patient presents with mobility close to baseline for gross motor skills.  Educated and performed mobility with appropriate technique with cervical precautions.  Feel no further skilled PT needs at this time.  Will sign off.    Follow Up Recommendations No PT follow up    Equipment Recommendations  None recommended by PT    Recommendations for Other Services       Precautions / Restrictions Precautions Precautions: Cervical Precaution Booklet Issued: Yes (comment)      Mobility  Bed Mobility Overal bed mobility: Modified Independent             General bed mobility comments: demonstrates technique appropriately  Transfers Overall transfer level: Needs assistance Equipment used: None Transfers: Sit to/from Stand Sit to Stand: Supervision         General transfer comment: for safety  Ambulation/Gait Ambulation/Gait assistance: Supervision Ambulation Distance (Feet): 400 Feet Assistive device: None Gait Pattern/deviations: Step-to pattern;Step-through pattern;Decreased stride length     General Gait Details: toe walks on L due to leg length discrepancy, but good balance and safe technique  Stairs Stairs: Yes Stairs assistance: Supervision Stair Management: No rails;Step to pattern Number of Stairs: 2    Wheelchair Mobility    Modified Rankin (Stroke Patients Only)       Balance Overall balance assessment: No apparent balance deficits (not formally assessed)                                            Pertinent Vitals/Pain Pain Assessment: No/denies pain    Home Living Family/patient expects to be discharged to:: Private residence Living Arrangements: Spouse/significant other Available Help at Discharge: Family Type of Home: House Home Access: Stairs to enter   Technical brewer of Steps: 2 Home Layout: One level Home Equipment: Environmental consultant - 2 wheels;Cane - single point;Bedside commode      Prior Function Level of Independence: Independent               Hand Dominance        Extremity/Trunk Assessment   Upper Extremity Assessment Upper Extremity Assessment: Defer to OT evaluation    Lower Extremity Assessment Lower Extremity Assessment: LLE deficits/detail LLE Deficits / Details: h/o traumatic injury and multiple surgeries with limited knee flexion to about 30-40 degrees and leg length discrepancy       Communication   Communication: No difficulties  Cognition Arousal/Alertness: Awake/alert Behavior During Therapy: WFL for tasks assessed/performed Overall Cognitive Status: Within Functional Limits for tasks assessed                                        General Comments      Exercises     Assessment/Plan    PT Assessment Patent does not need any further PT services  PT Problem List  PT Treatment Interventions      PT Goals (Current goals can be found in the Care Plan section)  Acute Rehab PT Goals PT Goal Formulation: All assessment and education complete, DC therapy    Frequency     Barriers to discharge        Co-evaluation               AM-PAC PT "6 Clicks" Daily Activity  Outcome Measure Difficulty turning over in bed (including adjusting bedclothes, sheets and blankets)?: A Little Difficulty moving from lying on back to sitting on the side of the bed? : A Little Difficulty sitting down on and standing up from a chair with arms (e.g., wheelchair, bedside commode, etc,.)?: A  Little Help needed moving to and from a bed to chair (including a wheelchair)?: None Help needed walking in hospital room?: None Help needed climbing 3-5 steps with a railing? : None 6 Click Score: 21    End of Session Equipment Utilized During Treatment: Gait belt Activity Tolerance: Patient tolerated treatment well Patient left: in bed;with call bell/phone within reach;with family/visitor present        Time: 9983-3825 PT Time Calculation (min) (ACUTE ONLY): 20 min   Charges:   PT Evaluation $PT Eval Low Complexity: 1 Low     PT G CodesMagda Kiel, [place] [phone] 04/30/2017   Reginia Naas 04/30/2017, 10:04 AM

## 2017-04-30 NOTE — Progress Notes (Signed)
Patient alert and oriented, mae's well, voiding adequate amount of urine, swallowing without difficulty, no c/o pain at time of discharge. Patient discharged home with family. Script and discharged instructions given to patient. Patient and family stated understanding of instructions given. Patient has an appointment with Dr. Cabbell   

## 2017-04-30 NOTE — Evaluation (Signed)
Occupational Therapy Evaluation Patient Details Name: Gregory [name] MRN: [id number] DOB: [birth year] Today's Date: 04/30/2017    History of Present Illness Sylvain D Sherk  57 y.o. male with severe carpal tunnel disease, a large central herniated disc at C2/3, and severe left ulnar neuropathy. He is admitted for operative decompression of each.  Now s/p ACDF C2-3, L carpal tunnel release and L ulnar nerve decompression and transposition.    Clinical Impression   This 57 y/o M presents with the above. At baseline pt is independent with ADLs and functional mobility. Pt lives with spouse who he reports will be assisting with ADLs PRN after return home. Pt completing room and hallway functional mobility without AD and with supervision throughout; currently requires min-modA for LB ADLs secondary to adhering to cervical precautions. Pt requires min verbal cues to adhere to cervical precautions during functional task completion this session. Education provided on precautions, safety and compensatory techniques for completing ADLs and functional transfers after return home; questions answered throughout. Pt reports feeling comfortable completing ADLs after return home with available spouse assist. No further acute OT needs identified at this time. Will sign off.     Follow Up Recommendations  Follow surgeon's recommendation for DC plan and follow-up therapies;Supervision/Assistance - 24 hour    Equipment Recommendations  None recommended by OT(pt has necessary DME )           Precautions / Restrictions Precautions Precautions: Cervical Precaution Booklet Issued: Yes (comment) Precaution Comments: issued and reviewed with pt/pt spouse  Restrictions Weight Bearing Restrictions: No      Mobility Bed Mobility Overal bed mobility: Modified Independent             General bed mobility comments: demonstrates technique appropriately  Transfers Overall transfer level: Needs  assistance Equipment used: None Transfers: Sit to/from Stand Sit to Stand: Supervision         General transfer comment: for safety    Balance Overall balance assessment: No apparent balance deficits (not formally assessed)                                         ADL either performed or assessed with clinical judgement   ADL Overall ADL's : Needs assistance/impaired Eating/Feeding: Minimal assistance;Sitting Eating/Feeding Details (indicate cue type and reason): due to bandaged L hand and pt is L handed Grooming: Minimal assistance;Standing   Upper Body Bathing: Sitting;Minimal assistance   Lower Body Bathing: Minimal assistance;Sit to/from stand   Upper Body Dressing : Min guard;Sitting   Lower Body Dressing: Moderate assistance;Sit to/from stand;With caregiver independent assisting Lower Body Dressing Details (indicate cue type and reason): pt with difficulty reaching LEs, spouse present during session and reports she will assist PRN  Toilet Transfer: Supervision/safety;Ambulation;Regular Museum/gallery exhibitions officer and Hygiene: Sit to/from stand;Min guard       Functional mobility during ADLs: Supervision/safety General ADL Comments: pt completing room and hallway functional mobility with supervision without AD, requires min verbal cues to maintain cervical precautions during functional task completion; spouse reports she is able to assist PRN after return home                          Pertinent Vitals/Pain Pain Assessment: No/denies pain     Hand Dominance Left   Extremity/Trunk Assessment Upper Extremity Assessment Upper Extremity Assessment: LUE deficits/detail LUE Deficits /  Details: s/p L carpal tunnel release and L ulnar nerve decompression and transposition; pt splinted at wrist, able to wiggle fingers and flex/extend at elbow; continues to have some decreased sensation at distal fingertips and along ulnar aspect of  forearm  LUE: Unable to fully assess due to immobilization   Lower Extremity Assessment Lower Extremity Assessment: Defer to PT evaluation LLE Deficits / Details: h/o traumatic injury and multiple surgeries with limited knee flexion to about 30-40 degrees and leg length discrepancy   Cervical / Trunk Assessment Cervical / Trunk Assessment: Other exceptions Cervical / Trunk Exceptions: s/p cervical surgery    Communication Communication Communication: No difficulties   Cognition Arousal/Alertness: Awake/alert(dozing off initially, though easy to arouse ) Behavior During Therapy: WFL for tasks assessed/performed Overall Cognitive Status: Within Functional Limits for tasks assessed                                           Exercises Other Exercises Other Exercises: instructed pt in gentle tendon gliding exercises for L hand with pt return demonstrating; pt completing elbow flexion/extension     Home Living Family/patient expects to be discharged to:: Private residence Living Arrangements: Spouse/significant other Available Help at Discharge: Family Type of Home: House Home Access: Stairs to enter Technical brewer of Steps: 2   Home Layout: One level     Bathroom Shower/Tub: Teacher, early years/pre: Standard     Home Equipment: Environmental consultant - 2 wheels;Cane - single point;Bedside commode          Prior Functioning/Environment Level of Independence: Independent                 OT Problem List: Decreased range of motion;Decreased knowledge of precautions;Decreased knowledge of use of DME or AE;Impaired UE functional use            OT Goals(Current goals can be found in the care plan section) Acute Rehab OT Goals Patient Stated Goal: return home  OT Goal Formulation: All assessment and education complete, DC therapy                                 AM-PAC PT "6 Clicks" Daily Activity     Outcome Measure Help from  another person eating meals?: A Little Help from another person taking care of personal grooming?: A Little Help from another person toileting, which includes using toliet, bedpan, or urinal?: A Little Help from another person bathing (including washing, rinsing, drying)?: A Little Help from another person to put on and taking off regular upper body clothing?: None Help from another person to put on and taking off regular lower body clothing?: A Lot 6 Click Score: 18   End of Session Equipment Utilized During Treatment: Gait belt Nurse Communication: Mobility status  Activity Tolerance: Patient tolerated treatment well Patient left: in bed;with call bell/phone within reach;with family/visitor present  OT Visit Diagnosis: Other abnormalities of gait and mobility (R26.89)                Time: 1610-9604 OT Time Calculation (min): 27 min Charges:  OT General Charges $OT Visit: 1 Visit OT Evaluation $OT Eval Low Complexity: 1 Low OT Treatments $Self Care/Home Management : 8-22 mins G-Codes:     Lou Cal, OT Pager [phone] 04/30/2017   Raymondo Band 04/30/2017, 11:13  AM

## 2017-05-13 ENCOUNTER — Other Ambulatory Visit: Payer: Self-pay | Admitting: Neurosurgery

## 2017-05-13 DIAGNOSIS — M5020 Other cervical disc displacement, unspecified cervical region: Secondary | ICD-10-CM

## 2017-05-26 ENCOUNTER — Ambulatory Visit (INDEPENDENT_AMBULATORY_CARE_PROVIDER_SITE_OTHER): Payer: Medicare HMO

## 2017-05-26 DIAGNOSIS — R200 Anesthesia of skin: Secondary | ICD-10-CM

## 2017-05-26 DIAGNOSIS — Z981 Arthrodesis status: Secondary | ICD-10-CM

## 2017-05-26 DIAGNOSIS — M5020 Other cervical disc displacement, unspecified cervical region: Secondary | ICD-10-CM

## 2017-06-09 ENCOUNTER — Other Ambulatory Visit: Payer: Self-pay | Admitting: Family Medicine

## 2017-06-12 ENCOUNTER — Other Ambulatory Visit: Payer: Self-pay | Admitting: Family Medicine

## 2017-06-25 ENCOUNTER — Other Ambulatory Visit: Payer: Self-pay | Admitting: Neurosurgery

## 2017-06-29 NOTE — Pre-Procedure Instructions (Signed)
Gregory [name]  06/29/2017      [id number] [address], [place] - [place] [address] Phone: [phone] Fax: [phone]    Your procedure is scheduled on 07/02/2017.  Report to [facility] Admitting at 1:20 P.M.  Call this number if you have problems the morning of surgery:  [phone]   Remember:  Do not eat food or drink liquids after midnight.   Continue all medications as directed by your physician except follow these medication instructions before surgery below   Take these medicines the morning of surgery with A SIP OF WATER: Cetirizine (Zyrtec) - if needed Fluticasone (Flonase) - if needed Topiramate (Topamax) - if needed Tramadol (Ultram) - if needed  7 days prior to surgery STOP taking any Aspirin(unless otherwise instructed by your surgeon), Aleve, Naproxen, Ibuprofen, Motrin, Advil, Goody's, BC's, all herbal medications, fish oil, and all vitamins    Do not wear jewelry.  Do not wear lotions, powders, or colognes, or deodorant.  Men may shave face and neck.  Do not bring valuables to the hospital.  [facility] is not responsible for any belongings or valuables.  Hearing aids, eyeglasses, contacts, dentures or bridgework may not be worn into surgery.  Leave your suitcase in the car.  After surgery it may be brought to your room.  For patients admitted to the hospital, discharge time will be determined by your treatment team.  Patients discharged the day of surgery will not be allowed to drive home.   Name and phone number of your driver:    Special instructions:   [place]- Preparing For Surgery  Before surgery, you can play an important role. Because skin is not sterile, your skin needs to be as free of germs as possible. You can reduce the number of germs on your skin by washing with CHG (chlorahexidine gluconate) Soap before surgery.  CHG is an antiseptic cleaner which kills germs and  bonds with the skin to continue killing germs even after washing.  Oral Hygiene is also important to reduce your risk of infection.  Remember - BRUSH YOUR TEETH THE MORNING OF SURGERY  Please do not use if you have an allergy to CHG or antibacterial soaps. If your skin becomes reddened/irritated stop using the CHG.  Do not shave (including legs and underarms) for at least 48 hours prior to first CHG shower. It is OK to shave your face.  Please follow these instructions carefully.   1. Shower the NIGHT BEFORE SURGERY and the MORNING OF SURGERY with CHG.   2. If you chose to wash your hair, wash your hair first as usual with your normal shampoo.  3. After you shampoo, rinse your hair and body thoroughly to remove the shampoo.  4. Use CHG as you would any other liquid soap. You can apply CHG directly to the skin and wash gently with a scrungie or a clean washcloth.   5. Apply the CHG Soap to your body ONLY FROM THE NECK DOWN.  Do not use on open wounds or open sores. Avoid contact with your eyes, ears, mouth and genitals (private parts). Wash Face and genitals (private parts)  with your normal soap.  6. Wash thoroughly, paying special attention to the area where your surgery will be performed.  7. Thoroughly rinse your body with warm water from the neck down.  8. DO NOT shower/wash with your normal soap after using and rinsing off the  CHG Soap.  9. Pat yourself dry with a CLEAN TOWEL.  10. Wear CLEAN PAJAMAS to bed the night before surgery, wear comfortable clothes the morning of surgery  11. Place CLEAN SHEETS on your bed the night of your first shower and DO NOT SLEEP WITH PETS.    Day of Surgery: Shower as stated above. Do not apply any deodorants/lotions.  Please wear clean clothes to the hospital/surgery center.   Remember to brush your teeth.      Please read over the following fact sheets that you were given.

## 2017-06-30 ENCOUNTER — Encounter (HOSPITAL_COMMUNITY)
Admission: RE | Admit: 2017-06-30 | Discharge: 2017-06-30 | Disposition: A | Discharge status: Home / Self Care | Payer: Medicare HMO | Source: Ambulatory Visit | Attending: Neurosurgery | Admitting: Neurosurgery

## 2017-06-30 ENCOUNTER — Other Ambulatory Visit: Payer: Self-pay

## 2017-06-30 ENCOUNTER — Encounter (HOSPITAL_COMMUNITY): Payer: Self-pay

## 2017-06-30 DIAGNOSIS — I10 Essential (primary) hypertension: Secondary | ICD-10-CM | POA: Diagnosis not present

## 2017-06-30 DIAGNOSIS — G5603 Carpal tunnel syndrome, bilateral upper limbs: Secondary | ICD-10-CM | POA: Diagnosis present

## 2017-06-30 DIAGNOSIS — Z87891 Personal history of nicotine dependence: Secondary | ICD-10-CM | POA: Diagnosis not present

## 2017-06-30 DIAGNOSIS — R69 Illness, unspecified: Secondary | ICD-10-CM | POA: Diagnosis not present

## 2017-06-30 DIAGNOSIS — Z79899 Other long term (current) drug therapy: Secondary | ICD-10-CM | POA: Diagnosis not present

## 2017-06-30 DIAGNOSIS — F329 Major depressive disorder, single episode, unspecified: Secondary | ICD-10-CM | POA: Diagnosis not present

## 2017-06-30 DIAGNOSIS — K219 Gastro-esophageal reflux disease without esophagitis: Secondary | ICD-10-CM | POA: Diagnosis not present

## 2017-06-30 DIAGNOSIS — G5621 Lesion of ulnar nerve, right upper limb: Secondary | ICD-10-CM | POA: Diagnosis not present

## 2017-06-30 DIAGNOSIS — G4730 Sleep apnea, unspecified: Secondary | ICD-10-CM | POA: Diagnosis not present

## 2017-06-30 LAB — CBC
HEMATOCRIT: 41.5 % (ref 39.0–52.0)
Hemoglobin: 13.8 g/dL (ref 13.0–17.0)
MCH: 28.8 pg (ref 26.0–34.0)
MCHC: 33.3 g/dL (ref 30.0–36.0)
MCV: 86.5 fL (ref 78.0–100.0)
Platelets: 366 10*3/uL (ref 150–400)
RBC: 4.8 MIL/uL (ref 4.22–5.81)
RDW: 12.6 % (ref 11.5–15.5)
WBC: 8.8 10*3/uL (ref 4.0–10.5)

## 2017-06-30 LAB — SURGICAL PCR SCREEN
MRSA, PCR: NEGATIVE
STAPHYLOCOCCUS AUREUS: NEGATIVE

## 2017-06-30 LAB — NO BLOOD PRODUCTS

## 2017-06-30 NOTE — Progress Notes (Signed)
PCP - Dr. Criss Rosales Cardiologist - patient denies  Chest x-ray - 12/22/2017 EKG - n/a Stress Test - patient denies ECHO - 2011 Cardiac Cath - patient denies  Sleep Study - 2017 CPAP - patient cannot tolerate CPAP    Anesthesia review: n/a  Patient denies shortness of breath, fever, cough and chest pain at PAT appointment   Patient verbalized understanding of instructions that were given to them at the PAT appointment. Patient was also instructed that they will need to review over the PAT instructions again at home before surgery.

## 2017-07-02 ENCOUNTER — Encounter (HOSPITAL_COMMUNITY): Payer: Self-pay | Admitting: *Deleted

## 2017-07-02 ENCOUNTER — Ambulatory Visit (HOSPITAL_COMMUNITY)
Admission: RE | Admit: 2017-07-02 | Discharge: 2017-07-02 | Disposition: A | Discharge status: Home / Self Care | Payer: Medicare HMO | Source: Ambulatory Visit | Attending: Neurosurgery | Admitting: Neurosurgery

## 2017-07-02 ENCOUNTER — Ambulatory Visit (HOSPITAL_COMMUNITY): Payer: Medicare HMO | Admitting: Anesthesiology

## 2017-07-02 ENCOUNTER — Encounter (HOSPITAL_COMMUNITY)
Admission: RE | Disposition: A | Discharge status: Home / Self Care | Payer: Self-pay | Source: Ambulatory Visit | Attending: Neurosurgery

## 2017-07-02 DIAGNOSIS — I10 Essential (primary) hypertension: Secondary | ICD-10-CM | POA: Diagnosis not present

## 2017-07-02 DIAGNOSIS — R69 Illness, unspecified: Secondary | ICD-10-CM | POA: Diagnosis not present

## 2017-07-02 DIAGNOSIS — K219 Gastro-esophageal reflux disease without esophagitis: Secondary | ICD-10-CM | POA: Diagnosis not present

## 2017-07-02 DIAGNOSIS — G5603 Carpal tunnel syndrome, bilateral upper limbs: Secondary | ICD-10-CM | POA: Diagnosis not present

## 2017-07-02 DIAGNOSIS — G5620 Lesion of ulnar nerve, unspecified upper limb: Secondary | ICD-10-CM | POA: Diagnosis not present

## 2017-07-02 DIAGNOSIS — Z87891 Personal history of nicotine dependence: Secondary | ICD-10-CM | POA: Diagnosis not present

## 2017-07-02 DIAGNOSIS — Z79899 Other long term (current) drug therapy: Secondary | ICD-10-CM | POA: Insufficient documentation

## 2017-07-02 DIAGNOSIS — G4730 Sleep apnea, unspecified: Secondary | ICD-10-CM | POA: Diagnosis not present

## 2017-07-02 DIAGNOSIS — F329 Major depressive disorder, single episode, unspecified: Secondary | ICD-10-CM | POA: Insufficient documentation

## 2017-07-02 DIAGNOSIS — E559 Vitamin D deficiency, unspecified: Secondary | ICD-10-CM | POA: Diagnosis not present

## 2017-07-02 DIAGNOSIS — G5621 Lesion of ulnar nerve, right upper limb: Secondary | ICD-10-CM | POA: Diagnosis not present

## 2017-07-02 DIAGNOSIS — G5601 Carpal tunnel syndrome, right upper limb: Secondary | ICD-10-CM | POA: Diagnosis not present

## 2017-07-02 HISTORY — PX: ULNAR NERVE TRANSPOSITION: SHX2595

## 2017-07-02 SURGERY — ULNAR NERVE DECOMPRESSION/TRANSPOSITION
Anesthesia: Monitor Anesthesia Care | Laterality: Right

## 2017-07-02 MED ORDER — PROMETHAZINE HCL 25 MG/ML IJ SOLN: 6.2500 mg | INTRAMUSCULAR | Status: DC | PRN

## 2017-07-02 MED ORDER — PHENYLEPHRINE 40 MCG/ML (10ML) SYRINGE FOR IV PUSH (FOR BLOOD PRESSURE SUPPORT)
PREFILLED_SYRINGE | INTRAVENOUS | Status: DC | PRN
  Administered 2017-07-02: 120 ug via INTRAVENOUS
  Administered 2017-07-02: 80 ug via INTRAVENOUS

## 2017-07-02 MED ORDER — LIDOCAINE-EPINEPHRINE 0.5 %-1:200000 IJ SOLN
INTRAMUSCULAR | Status: DC | PRN
  Administered 2017-07-02 (×2): 4 mL via INTRADERMAL

## 2017-07-02 MED ORDER — OXYCODONE HCL 5 MG PO TABS: 5.0000 mg | ORAL_TABLET | Freq: Once | ORAL | Status: DC | PRN

## 2017-07-02 MED ORDER — DEXAMETHASONE SODIUM PHOSPHATE 4 MG/ML IJ SOLN
INTRAMUSCULAR | Status: DC | PRN
  Administered 2017-07-02: 5 mg via INTRAVENOUS

## 2017-07-02 MED ORDER — OXYCODONE HCL 5 MG/5ML PO SOLN: 5.0000 mg | Freq: Once | ORAL | Status: DC | PRN

## 2017-07-02 MED ORDER — CHLORHEXIDINE GLUCONATE CLOTH 2 % EX PADS: 6.0000 | MEDICATED_PAD | Freq: Once | CUTANEOUS | Status: DC

## 2017-07-02 MED ORDER — CEFAZOLIN SODIUM-DEXTROSE 2-4 GM/100ML-% IV SOLN
INTRAVENOUS | Status: AC
  Filled 2017-07-02: qty 100

## 2017-07-02 MED ORDER — PROPOFOL 10 MG/ML IV BOLUS
INTRAVENOUS | Status: DC | PRN
  Administered 2017-07-02: 200 mg via INTRAVENOUS

## 2017-07-02 MED ORDER — 0.9 % SODIUM CHLORIDE (POUR BTL) OPTIME
TOPICAL | Status: DC | PRN
  Administered 2017-07-02: 1000 mL

## 2017-07-02 MED ORDER — FENTANYL CITRATE (PF) 250 MCG/5ML IJ SOLN
INTRAMUSCULAR | Status: AC
  Filled 2017-07-02: qty 5

## 2017-07-02 MED ORDER — FENTANYL CITRATE (PF) 250 MCG/5ML IJ SOLN
INTRAMUSCULAR | Status: DC | PRN
  Administered 2017-07-02: 25 ug via INTRAVENOUS

## 2017-07-02 MED ORDER — EPHEDRINE SULFATE 50 MG/ML IJ SOLN
INTRAMUSCULAR | Status: DC | PRN
  Administered 2017-07-02: 10 mg via INTRAVENOUS

## 2017-07-02 MED ORDER — DEXAMETHASONE SODIUM PHOSPHATE 10 MG/ML IJ SOLN
INTRAMUSCULAR | Status: AC
  Filled 2017-07-02: qty 1

## 2017-07-02 MED ORDER — LIDOCAINE HCL (CARDIAC) PF 100 MG/5ML IV SOSY
PREFILLED_SYRINGE | INTRAVENOUS | Status: DC | PRN
  Administered 2017-07-02: 80 mg via INTRAVENOUS

## 2017-07-02 MED ORDER — LACTATED RINGERS IV SOLN
INTRAVENOUS | Status: DC
  Administered 2017-07-02: 14:00:00 via INTRAVENOUS

## 2017-07-02 MED ORDER — MIDAZOLAM HCL 5 MG/5ML IJ SOLN
INTRAMUSCULAR | Status: DC | PRN
  Administered 2017-07-02: 2 mg via INTRAVENOUS

## 2017-07-02 MED ORDER — TRAMADOL HCL 50 MG PO TABS: 50.0000 mg | ORAL_TABLET | Freq: Four times a day (QID) | ORAL | 0 refills | Status: AC | PRN

## 2017-07-02 MED ORDER — BACITRACIN ZINC 500 UNIT/GM EX OINT
TOPICAL_OINTMENT | CUTANEOUS | Status: AC
  Filled 2017-07-02: qty 28.35

## 2017-07-02 MED ORDER — LIDOCAINE-EPINEPHRINE 0.5 %-1:200000 IJ SOLN
INTRAMUSCULAR | Status: AC
  Filled 2017-07-02: qty 1

## 2017-07-02 MED ORDER — BACITRACIN ZINC 500 UNIT/GM EX OINT
TOPICAL_OINTMENT | CUTANEOUS | Status: DC | PRN
  Administered 2017-07-02: 1 via TOPICAL

## 2017-07-02 MED ORDER — FENTANYL CITRATE (PF) 100 MCG/2ML IJ SOLN: 25.0000 ug | INTRAMUSCULAR | Status: DC | PRN

## 2017-07-02 MED ORDER — CEFAZOLIN SODIUM-DEXTROSE 2-4 GM/100ML-% IV SOLN
2.0000 g | INTRAVENOUS | Status: AC
  Administered 2017-07-02: 2 g via INTRAVENOUS

## 2017-07-02 MED ORDER — MIDAZOLAM HCL 2 MG/2ML IJ SOLN
INTRAMUSCULAR | Status: AC
  Filled 2017-07-02: qty 2

## 2017-07-02 MED ORDER — ONDANSETRON HCL 4 MG/2ML IJ SOLN
INTRAMUSCULAR | Status: DC | PRN
  Administered 2017-07-02: 4 mg via INTRAVENOUS

## 2017-07-02 SURGICAL SUPPLY — 47 items
ADH SKN CLS APL DERMABOND .7 (GAUZE/BANDAGES/DRESSINGS) ×2
BANDAGE ACE 3X5.8 VEL STRL LF (GAUZE/BANDAGES/DRESSINGS) ×4 IMPLANT
BLADE SURG 10 STRL SS (BLADE) ×2 IMPLANT
BLADE SURG 15 STRL LF DISP TIS (BLADE) ×1 IMPLANT
BLADE SURG 15 STRL SS (BLADE) ×2
BNDG GAUZE ELAST 4 BULKY (GAUZE/BANDAGES/DRESSINGS) ×2 IMPLANT
CABLE BIPOLOR RESECTION CORD (MISCELLANEOUS) ×2 IMPLANT
DECANTER SPIKE VIAL GLASS SM (MISCELLANEOUS) ×1 IMPLANT
DERMABOND ADVANCED (GAUZE/BANDAGES/DRESSINGS) ×2
DERMABOND ADVANCED .7 DNX12 (GAUZE/BANDAGES/DRESSINGS) IMPLANT
DRAPE EXTREMITY T 121X128X90 (DRAPE) ×2 IMPLANT
DRAPE HALF SHEET 40X57 (DRAPES) ×2 IMPLANT
DURAPREP 26ML APPLICATOR (WOUND CARE) ×2 IMPLANT
ELECT CAUTERY BLADE 6.4 (BLADE) ×2 IMPLANT
GAUZE SPONGE 4X4 12PLY STRL (GAUZE/BANDAGES/DRESSINGS) ×2 IMPLANT
GAUZE SPONGE 4X4 16PLY XRAY LF (GAUZE/BANDAGES/DRESSINGS) ×2 IMPLANT
GLOVE ECLIPSE 6.5 STRL STRAW (GLOVE) ×2 IMPLANT
GLOVE EXAM NITRILE LRG STRL (GLOVE) IMPLANT
GLOVE EXAM NITRILE XL STR (GLOVE) IMPLANT
GLOVE EXAM NITRILE XS STR PU (GLOVE) IMPLANT
GOWN STRL REUS W/ TWL LRG LVL3 (GOWN DISPOSABLE) ×2 IMPLANT
GOWN STRL REUS W/ TWL XL LVL3 (GOWN DISPOSABLE) ×1 IMPLANT
GOWN STRL REUS W/TWL 2XL LVL3 (GOWN DISPOSABLE) IMPLANT
GOWN STRL REUS W/TWL LRG LVL3 (GOWN DISPOSABLE) ×4
GOWN STRL REUS W/TWL XL LVL3 (GOWN DISPOSABLE)
KIT BASIN OR (CUSTOM PROCEDURE TRAY) ×2 IMPLANT
KIT TURNOVER KIT B (KITS) ×2 IMPLANT
LOCATOR NERVE 3 VOLT (DISPOSABLE) ×1 IMPLANT
NDL HYPO 25X1 1.5 SAFETY (NEEDLE) ×1 IMPLANT
NEEDLE HYPO 25X1 1.5 SAFETY (NEEDLE) ×2 IMPLANT
NS IRRIG 1000ML POUR BTL (IV SOLUTION) ×2 IMPLANT
PACK SURGICAL SETUP 50X90 (CUSTOM PROCEDURE TRAY) ×2 IMPLANT
PAD ARMBOARD 7.5X6 YLW CONV (MISCELLANEOUS) ×6 IMPLANT
PENCIL BUTTON HOLSTER BLD 10FT (ELECTRODE) ×2 IMPLANT
PROBE FOR NEUROSURGERY (MISCELLANEOUS) ×2 IMPLANT
STOCKINETTE 4X48 STRL (DRAPES) ×2 IMPLANT
SUT ETHILON 3 0 PS 1 (SUTURE) ×2 IMPLANT
SUT SILK 3 0 SH 30 (SUTURE) IMPLANT
SUT VIC AB 2-0 CT2 18 VCP726D (SUTURE) ×2 IMPLANT
SUT VIC AB 3-0 SH 8-18 (SUTURE) ×3 IMPLANT
SYR BULB 3OZ (MISCELLANEOUS) ×2 IMPLANT
SYR CONTROL 10ML LL (SYRINGE) ×2 IMPLANT
TOWEL GREEN STERILE (TOWEL DISPOSABLE) ×2 IMPLANT
TOWEL GREEN STERILE FF (TOWEL DISPOSABLE) ×2 IMPLANT
TUBE CONNECTING 12X1/4 (SUCTIONS) ×2 IMPLANT
UNDERPAD 30X30 (UNDERPADS AND DIAPERS) ×2 IMPLANT
WATER STERILE IRR 1000ML POUR (IV SOLUTION) ×2 IMPLANT

## 2017-07-02 NOTE — Progress Notes (Signed)
Orthopedic Tech Progress Note Patient Details:  Gregory [name] 09/21/1960 [id number]  Ortho Devices Type of Ortho Device: Arm sling Ortho Device/Splint Location: RUE Ortho Device/Splint Interventions: Ordered, Application   Post Interventions Patient Tolerated: Well Instructions Provided: Care of device   Braulio Bosch 07/02/2017, 7:31 PM

## 2017-07-02 NOTE — Discharge Instructions (Signed)
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
## 2017-07-02 NOTE — Op Note (Signed)
   7:19 PM  PATIENT:  Gregory [name]  57 y.o. male  PRE-OPERATIVE DIAGNOSIS:  right Carpal Tunnel Syndrome, right cubital tunnel syndrome  POST-OPERATIVE DIAGNOSIS:  right Carpal Tunnel Syndrome, right cubital tunnel syndrome  PROCEDURE:  Procedure(s): ULNAR NERVE RELEASE RIGHT, CARPAL TUNNEL RELEASE RIGHT  SURGEON: Surgeon(s): Ashok Pall, MD  ANESTHESIA:   general  EBL:  Total I/O In: 400 [P.O.:250; I.V.:150] Out: -   COUNT:per nursing  DICTATION: Gregory [name] was taken to the operating room, given IV sedation, and with an LMA was administered a general anesthetic. He was positioned on the operating room table. male had their right upper extremity prepped and draped in a sterile manner.  I infiltrated lidocaine into the crescent shaped incision to expose the cubital tunnel in the right elbow. I opened the skin with a 10 blade. I used scissors to dissect down to the cubital tunnel. There was a significant amount of scar tissue surrounding the ulnar nerve. I did use a nerve stimulator to confirm that I had found the ulnar nerve. I opened the tunnel both proximally and distally dividing the proximal portion of the FCU, distally. I irrigated then closed the wound in layers approximating the subcutaneous tissue then the subcuticular layer with vicryl sutures. I used dermabond for a sterile dressing.   I infiltrated Licodcaine  6/7% 3/419,379 strength epinephrine into the planned incision starting at the proximal palmar crease extending into the hand ~ 1.5cm. I opened the skin with a 15 blade and extended the incision through the skin into the subcutaneous tissue. I used the bipolar cautery to control the subcutaneous bleeding. I dissected sharply through the tissue using forceps also to expose the transverse carpal ligament. I divided the transverse carpal ligament sharply with the 15 blade using the forceps to protect the contents of the carpal tunnel. With the scissors I divided the  ligament both proximally and distally to decompress the entire carpal tunnel. I used the scissors to dissect into the forearm to create space to divide the ligament to the proximal palmar crease, and distally into the palm.  I irrigated the wound then closed the incision with vertical interrupted vertical mattress sutures. I placed a sterile dressing, then wrapped the proximal hand and distal forearm with an ace wrap.  PLAN OF CARE: Discharge to home after PACU  PATIENT DISPOSITION:  PACU - hemodynamically stable.   Delay start of Pharmacological VTE agent (>24hrs) due to surgical blood loss or risk of bleeding:  yes

## 2017-07-02 NOTE — Anesthesia Postprocedure Evaluation (Signed)
Anesthesia Post Note  [name]  Procedure(s) Performed: ULNAR NERVE RELEASE RIGHT, CARPAL TUNNEL RELEASE RIGHT (Right )     Patient location during evaluation: PACU Anesthesia Type: General Level of consciousness: awake and alert Pain management: pain level controlled Vital Signs Assessment: post-procedure vital signs reviewed and stable Respiratory status: spontaneous breathing, nonlabored ventilation and respiratory function stable Cardiovascular status: blood pressure returned to baseline and stable Postop Assessment: no apparent nausea or vomiting Anesthetic complications: no    Last Vitals:  Vitals:   07/02/17 1856 07/02/17 1905  BP:  (!) 140/94  Pulse: 80 74  Resp: (!) 21 16  Temp:  (!) 36.1 C  SpO2: 96% 100%    Last Pain:  Vitals:   07/02/17 1905  TempSrc:   PainSc: 0-No pain                 Lynda Rainwater

## 2017-07-02 NOTE — Anesthesia Preprocedure Evaluation (Addendum)
Anesthesia Evaluation  Patient identified by MRN, date of birth, ID band Patient awake    Reviewed: Allergy & Precautions, H&P , NPO status , Patient's Chart, lab work & pertinent test results  Airway Mallampati: II  TM Distance: >3 FB Neck ROM: Full    Dental no notable dental hx.    Pulmonary neg pulmonary ROS, sleep apnea , former smoker,    Pulmonary exam normal breath sounds clear to auscultation       Cardiovascular hypertension, Pt. on medications Normal cardiovascular exam Rhythm:Regular Rate:Normal     Neuro/Psych  Headaches, PSYCHIATRIC DISORDERS Depression Bipolar Disorder    GI/Hepatic Neg liver ROS, GERD  Medicated and Controlled,  Endo/Other  negative endocrine ROS  Renal/GU negative Renal ROS     Musculoskeletal  (+) Arthritis ,   Abdominal   Peds negative pediatric ROS (+)  Hematology negative hematology ROS (+) REFUSES BLOOD PRODUCTS, JEHOVAH'S WITNESS  Anesthesia Other Findings   Reproductive/Obstetrics negative OB ROS                             Anesthesia Physical  Anesthesia Plan  ASA: II  Anesthesia Plan: General   Post-op Pain Management:    Induction: Intravenous  PONV Risk Score and Plan: 2 and Ondansetron and Midazolam  Airway Management Planned: LMA  Additional Equipment:   Intra-op Plan:   Post-operative Plan:   Informed Consent: I have reviewed the patients History and Physical, chart, labs and discussed the procedure including the risks, benefits and alternatives for the proposed anesthesia with the patient or authorized representative who has indicated his/her understanding and acceptance.   Dental advisory given  Plan Discussed with: CRNA  Anesthesia Plan Comments:        Anesthesia Quick Evaluation

## 2017-07-02 NOTE — Transfer of Care (Signed)
Immediate Anesthesia Transfer of Care Note  [name]  Procedure(s) Performed: ULNAR NERVE RELEASE RIGHT, CARPAL TUNNEL RELEASE RIGHT (Right )  Patient Location: PACU  Anesthesia Type:General  Level of Consciousness: awake, alert , oriented and patient cooperative  Airway & Oxygen Therapy: Patient Spontanous Breathing and Patient connected to nasal cannula oxygen  Post-op Assessment: Report given to RN, Post -op Vital signs reviewed and stable and Patient moving all extremities  Post vital signs: Reviewed and stable  Last Vitals:  Vitals Value Taken Time  BP 152/99 07/02/2017  6:22 PM  Temp    Pulse 75 07/02/2017  6:23 PM  Resp 29 07/02/2017  6:23 PM  SpO2 89 % 07/02/2017  6:23 PM  Vitals shown include unvalidated device data.  Last Pain:  Vitals:   07/02/17 1346  TempSrc: Oral  PainSc:       Patients Stated Pain Goal: 2 (46/56/81 2751)  Complications: No apparent anesthesia complications

## 2017-07-02 NOTE — Anesthesia Procedure Notes (Signed)
Procedure Name: LMA Insertion Date/Time: 07/02/2017 4:40 PM Performed by: Myna Bright, CRNA Pre-anesthesia Checklist: Patient identified, Emergency Drugs available, Suction available and Patient being monitored Patient Re-evaluated:Patient Re-evaluated prior to induction Oxygen Delivery Method: Circle system utilized Preoxygenation: Pre-oxygenation with 100% oxygen Induction Type: IV induction Ventilation: Mask ventilation without difficulty LMA: LMA inserted LMA Size: 4.0 Number of attempts: 1 Placement Confirmation: positive ETCO2 and breath sounds checked- equal and bilateral Tube secured with: Tape Dental Injury: Teeth and Oropharynx as per pre-operative assessment

## 2017-07-03 ENCOUNTER — Encounter (HOSPITAL_COMMUNITY): Payer: Self-pay | Admitting: Neurosurgery

## 2017-07-09 ENCOUNTER — Other Ambulatory Visit: Payer: Self-pay | Admitting: Neurosurgery

## 2017-09-09 ENCOUNTER — Other Ambulatory Visit: Payer: Self-pay | Admitting: Family Medicine

## 2017-09-30 ENCOUNTER — Ambulatory Visit: Payer: Medicare Other | Admitting: Family Medicine

## 2017-10-06 DIAGNOSIS — Z79891 Long term (current) use of opiate analgesic: Secondary | ICD-10-CM | POA: Diagnosis not present

## 2017-10-06 DIAGNOSIS — Z803 Family history of malignant neoplasm of breast: Secondary | ICD-10-CM | POA: Diagnosis not present

## 2017-10-06 DIAGNOSIS — Z7722 Contact with and (suspected) exposure to environmental tobacco smoke (acute) (chronic): Secondary | ICD-10-CM | POA: Diagnosis not present

## 2017-10-06 DIAGNOSIS — E785 Hyperlipidemia, unspecified: Secondary | ICD-10-CM | POA: Diagnosis not present

## 2017-10-06 DIAGNOSIS — G43909 Migraine, unspecified, not intractable, without status migrainosus: Secondary | ICD-10-CM | POA: Diagnosis not present

## 2017-10-06 DIAGNOSIS — G4700 Insomnia, unspecified: Secondary | ICD-10-CM | POA: Diagnosis not present

## 2017-10-06 DIAGNOSIS — R69 Illness, unspecified: Secondary | ICD-10-CM | POA: Diagnosis not present

## 2017-10-06 DIAGNOSIS — Z809 Family history of malignant neoplasm, unspecified: Secondary | ICD-10-CM | POA: Diagnosis not present

## 2017-10-06 DIAGNOSIS — G8929 Other chronic pain: Secondary | ICD-10-CM | POA: Diagnosis not present

## 2017-10-20 ENCOUNTER — Encounter: Payer: Self-pay | Admitting: Family Medicine

## 2017-10-20 ENCOUNTER — Ambulatory Visit (INDEPENDENT_AMBULATORY_CARE_PROVIDER_SITE_OTHER): Payer: Medicare HMO | Admitting: Family Medicine

## 2017-10-20 ENCOUNTER — Other Ambulatory Visit: Payer: Self-pay

## 2017-10-20 VITALS — BP 152/100 | HR 91 | Ht 67.0 in | Wt 185.0 lb

## 2017-10-20 DIAGNOSIS — K219 Gastro-esophageal reflux disease without esophagitis: Secondary | ICD-10-CM

## 2017-10-20 DIAGNOSIS — J302 Other seasonal allergic rhinitis: Secondary | ICD-10-CM

## 2017-10-20 DIAGNOSIS — G43009 Migraine without aura, not intractable, without status migrainosus: Secondary | ICD-10-CM | POA: Diagnosis not present

## 2017-10-20 DIAGNOSIS — F319 Bipolar disorder, unspecified: Secondary | ICD-10-CM

## 2017-10-20 DIAGNOSIS — I10 Essential (primary) hypertension: Secondary | ICD-10-CM

## 2017-10-20 DIAGNOSIS — G4700 Insomnia, unspecified: Secondary | ICD-10-CM | POA: Diagnosis not present

## 2017-10-20 DIAGNOSIS — R69 Illness, unspecified: Secondary | ICD-10-CM | POA: Diagnosis not present

## 2017-10-20 DIAGNOSIS — Z23 Encounter for immunization: Secondary | ICD-10-CM | POA: Diagnosis not present

## 2017-10-20 DIAGNOSIS — R7309 Other abnormal glucose: Secondary | ICD-10-CM

## 2017-10-20 LAB — POCT GLYCOSYLATED HEMOGLOBIN (HGB A1C): HBA1C, POC (CONTROLLED DIABETIC RANGE): 5.5 % (ref 0.0–7.0)

## 2017-10-20 MED ORDER — DEXLANSOPRAZOLE 60 MG PO CPDR: 60.0000 mg | DELAYED_RELEASE_CAPSULE | Freq: Every day | ORAL | 3 refills | Status: DC

## 2017-10-20 MED ORDER — TRAZODONE HCL 50 MG PO TABS: ORAL_TABLET | ORAL | 0 refills | Status: DC

## 2017-10-20 MED ORDER — FLUOXETINE HCL 20 MG PO CAPS: ORAL_CAPSULE | ORAL | 1 refills | Status: DC

## 2017-10-20 MED ORDER — TOPIRAMATE 200 MG PO TABS: 100.0000 mg | ORAL_TABLET | Freq: Two times a day (BID) | ORAL | 4 refills | Status: DC

## 2017-10-20 MED ORDER — CETIRIZINE HCL 10 MG PO TABS: 10.0000 mg | ORAL_TABLET | Freq: Every day | ORAL | 4 refills | Status: DC

## 2017-10-20 NOTE — Assessment & Plan Note (Signed)
We discussed multiple high pressures in recent visits.  He admits to significant salt intake and would prefer to try and eliminate that prior to taking medications.  We will try this and recheck in a month for progress.

## 2017-10-20 NOTE — Assessment & Plan Note (Signed)
Ordered A1C, down to 5.5 from 5.7 last year august

## 2017-10-20 NOTE — Patient Instructions (Addendum)
  It was a pleasure to see you today! Thank you for choosing [facility] for your primary care. Gregory [name] was seen for blood pressure. Come back to the clinic if in ~56month to discuss your progress on blood pressure with cutting out salt, and go to the emergency room if you have life threatening symptoms.   Congratulations on lowering your A1C!   Please bring all your medications to every doctors visit   Sign up for My Chart to have easy access to your labs results, and communication with your Primary care physician.     Please check-out at the front desk before leaving the clinic.     Best,  Dr. Sherene Sires FAMILY MEDICINE RESIDENT - PGY2 10/20/2017 2:43 PM

## 2017-10-20 NOTE — Progress Notes (Signed)
    Subjective:  Gregory [name] is a 57 y.o. male who presents to the [facility] today with a chief complaint of medication management.   HPI: Medication management Patient with no new physical complaints come in today to get medication refilled.  He does not request any changes  Flu shot Patient would like his flu shot  DM Patient consents to A1C check. He thinks he will be lower because he has been trying to avoid sugars and lives a very physically active life.  He has no foot/visual complaints  HTN This has been a struggle for this patient.  He adds salt to almost every meal and was slightly HTN today. He expresses a firm desire to avoid meds and would like to make a concerted effort to avoid salt for a month to see how that helps.    Objective:  Physical Exam: BP (!) 152/100   Pulse 91   Ht 5\' 7"  (1.702 m)   Wt 185 lb (83.9 kg)   SpO2 96%   BMI 28.98 kg/m   Gen: NAD, resting comfortably CV: RRR with no murmurs appreciated Pulm: NWOB, CTAB with no crackles, wheezes, or rhonchi MSK: no edema, cyanosis, or clubbing noted Skin: warm, dry Neuro: grossly normal, moves all extremities Psych: Normal affect and thought content  Results for orders placed or performed in visit on 10/20/17 (from the past 72 hour(s))  HgB A1c     Status: None   Collection Time: 10/20/17  2:20 PM  Result Value Ref Range   Hemoglobin A1C     HbA1c POC (<> result, manual entry)     HbA1c, POC (prediabetic range)     HbA1c, POC (controlled diabetic range) 5.5 0.0 - 7.0 %     Assessment/Plan:  Other abnormal glucose Ordered A1C, down to 5.5 from 5.7 last year august  Primary hypertension We discussed multiple high pressures in recent visits.  He admits to significant salt intake and would prefer to try and eliminate that prior to taking medications.  We will try this and recheck in a month for progress.   Sherene Sires, DO FAMILY MEDICINE RESIDENT - PGY2 10/22/2017 2:17 PM

## 2017-10-22 DIAGNOSIS — Z23 Encounter for immunization: Secondary | ICD-10-CM | POA: Insufficient documentation

## 2017-10-22 NOTE — Assessment & Plan Note (Signed)
Patient consents to flu shot 

## 2017-12-04 ENCOUNTER — Other Ambulatory Visit: Payer: Self-pay

## 2017-12-04 ENCOUNTER — Encounter: Payer: Self-pay | Admitting: Family Medicine

## 2017-12-04 ENCOUNTER — Ambulatory Visit (INDEPENDENT_AMBULATORY_CARE_PROVIDER_SITE_OTHER): Payer: Medicare HMO | Admitting: Family Medicine

## 2017-12-04 VITALS — BP 142/98 | HR 78 | Temp 98.3°F | Ht 67.0 in | Wt 192.0 lb

## 2017-12-04 DIAGNOSIS — G43009 Migraine without aura, not intractable, without status migrainosus: Secondary | ICD-10-CM | POA: Diagnosis not present

## 2017-12-04 DIAGNOSIS — M25562 Pain in left knee: Secondary | ICD-10-CM

## 2017-12-04 DIAGNOSIS — I10 Essential (primary) hypertension: Secondary | ICD-10-CM

## 2017-12-04 MED ORDER — TOPIRAMATE 200 MG PO TABS: 200.0000 mg | ORAL_TABLET | Freq: Every day | ORAL | 0 refills | Status: DC

## 2017-12-04 MED ORDER — TRAMADOL HCL 50 MG PO TABS: 50.0000 mg | ORAL_TABLET | Freq: Every day | ORAL | 0 refills | Status: AC | PRN

## 2017-12-04 MED ORDER — LISINOPRIL 10 MG PO TABS: 10.0000 mg | ORAL_TABLET | Freq: Every day | ORAL | 0 refills | Status: DC

## 2017-12-04 NOTE — Progress Notes (Signed)
    Subjective:  Gregory [name] is a 57 y.o. male who presents to the [facility] today with a chief complaint of HTN.   HPI: Patient with history of HTN who has been resistent to medication but unsuccesful in attempts to wean salt use at home present again with HTN.  Wife has been pressuring to consider medication, he has signficant history of family with HTN/heart/stroke problems and is willing to consider this for the first time.   Objective:  Physical Exam: BP (!) 142/98   Pulse 78   Temp 98.3 F (36.8 C) (Oral)   Ht 5\' 7"  (1.702 m)   Wt 192 lb (87.1 kg)   SpO2 98%   BMI 30.07 kg/m   Gen: NAD, resting comfortably CV: RRR with no murmurs appreciated Pulm: NWOB, CTAB with no crackles, wheezes, or rhonchi GI: Normal bowel sounds present. Soft, Nontender, Nondistended. MSK: no edema, cyanosis, or clubbing noted Skin: warm, dry Neuro: grossly normal, moves all extremities Psych: Normal affect and thought content  No results found for this or any previous visit (from the past 72 hour(s)).   Assessment/Plan:  Essential hypertension Will take BMP today in clinic for baseline and start low dose lisinpril.  Patient will come in next week for lab/nurse visit for repeat BMP and bp check.  We can titrate from there.   Sherene Sires, DO FAMILY MEDICINE RESIDENT - PGY2 12/10/2017 3:20 PM

## 2017-12-04 NOTE — Patient Instructions (Signed)
It was a pleasure to see you today! Thank you for choosing [facility] for your primary care. Gregory [name] was seen for blood presure. Come back to the clinic in 2 weeks for your blood draw and blood pressure, and go to the emergency room if you have any life threatening symptoms.  We are starting you on lisinopril today, we'll see you in 2 weeks for a followup to see how you are reacting.    If we did any lab work today that did not result today, one of two things will happen.  1. If everything is normal, you will get a letter in mail sent to the address in your chart with the results for your records.  It is important to keep your address up to date as that is where we will send results.  2. If the results require some sort of discussion, my nurses or myself will call you on the phone number listed in your records.  It is important to keep your phone number up to date in our system as this is how we will try to reach you.  If we cannot reach you on the phone, we will try to send you a letter in the mail so please enable to voicemail function of your phone.  If you don't hear from [place] in two weeks, please give [place] a call to verify your results. Otherwise, we look forward to seeing you again at your next visit. If you have any questions or concerns before then, please call the clinic at [phone].   Please bring all your medications to every doctors visit   Sign up for My Chart to have easy access to your labs results, and communication with your Primary care physician.     Please check-out at the front desk before leaving the clinic.     Best,  Dr. Sherene Sires FAMILY MEDICINE RESIDENT - PGY2 12/04/2017 2:47 PM

## 2017-12-05 LAB — BASIC METABOLIC PANEL
BUN/Creatinine Ratio: 9 (ref 9–20)
BUN: 12 mg/dL (ref 6–24)
CALCIUM: 9.6 mg/dL (ref 8.7–10.2)
CO2: 25 mmol/L (ref 20–29)
Chloride: 102 mmol/L (ref 96–106)
Creatinine, Ser: 1.27 mg/dL (ref 0.76–1.27)
GFR calc Af Amer: 71 mL/min/{1.73_m2} (ref >59)
GFR, EST NON AFRICAN AMERICAN: 61 mL/min/{1.73_m2} (ref >59)
Glucose: 103 mg/dL — ABNORMAL HIGH (ref 65–99)
POTASSIUM: 4.3 mmol/L (ref 3.5–5.2)
Sodium: 141 mmol/L (ref 134–144)

## 2017-12-10 ENCOUNTER — Encounter (HOSPITAL_COMMUNITY): Payer: Self-pay | Admitting: Emergency Medicine

## 2017-12-10 ENCOUNTER — Ambulatory Visit (HOSPITAL_COMMUNITY)
Admission: EM | Admit: 2017-12-10 | Discharge: 2017-12-10 | Disposition: A | Discharge status: Home / Self Care | Payer: Medicare HMO | Attending: Family Medicine | Admitting: Family Medicine

## 2017-12-10 ENCOUNTER — Ambulatory Visit: Payer: Medicare Other

## 2017-12-10 DIAGNOSIS — R059 Cough, unspecified: Secondary | ICD-10-CM

## 2017-12-10 DIAGNOSIS — J0100 Acute maxillary sinusitis, unspecified: Secondary | ICD-10-CM

## 2017-12-10 DIAGNOSIS — R05 Cough: Secondary | ICD-10-CM

## 2017-12-10 MED ORDER — ALBUTEROL SULFATE HFA 108 (90 BASE) MCG/ACT IN AERS: 1.0000 | INHALATION_SPRAY | Freq: Four times a day (QID) | RESPIRATORY_TRACT | 0 refills | Status: DC | PRN

## 2017-12-10 MED ORDER — AMOXICILLIN-POT CLAVULANATE 875-125 MG PO TABS: 1.0000 | ORAL_TABLET | Freq: Two times a day (BID) | ORAL | 0 refills | Status: AC

## 2017-12-10 NOTE — Discharge Instructions (Signed)
Use your Flonase daily.  Push fluids to ensure adequate hydration and keep secretions thin.  Tylenol and/or ibuprofen as needed for pain or fevers.  Complete course of antibiotics.  Inhaler every 4-6 hours as needed for wheezing or shortness of breath .  Over the counter mucinex may be helpful as an expectorant as well.  If symptoms worsen or do not improve in the next week to return to be seen or to follow up with your PCP. This may take another 7-10 days for resolution.

## 2017-12-10 NOTE — ED Triage Notes (Signed)
PT reports severe coughing fits, congestion, eye pain, facial pain, headache, and fatigue for 1 week.
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
## 2017-12-10 NOTE — Assessment & Plan Note (Signed)
Will take BMP today in clinic for baseline and start low dose lisinpril.  Patient will come in next week for lab/nurse visit for repeat BMP and bp check.  We can titrate from there.

## 2017-12-14 ENCOUNTER — Telehealth: Payer: Self-pay

## 2017-12-14 DIAGNOSIS — I10 Essential (primary) hypertension: Secondary | ICD-10-CM

## 2017-12-14 MED ORDER — LOSARTAN POTASSIUM 50 MG PO TABS: 50.0000 mg | ORAL_TABLET | Freq: Every day | ORAL | 0 refills | Status: DC

## 2017-12-14 NOTE — Telephone Encounter (Signed)
Patient with severe dry cough immediately on starting lisinopril.  Ordering losartan as alternative.  -Dr. Criss Rosales

## 2017-12-14 NOTE — Telephone Encounter (Signed)
Patient would like to discuss changing Lisinopril. Having "extreme coughing" and thinks it is related to med.  Call back is [phone]  Danley Danker, RN [facility] RN)

## 2017-12-22 ENCOUNTER — Other Ambulatory Visit: Payer: Self-pay

## 2017-12-22 ENCOUNTER — Encounter: Payer: Self-pay | Admitting: Family Medicine

## 2017-12-22 ENCOUNTER — Ambulatory Visit (INDEPENDENT_AMBULATORY_CARE_PROVIDER_SITE_OTHER): Payer: Medicare HMO | Admitting: Family Medicine

## 2017-12-22 VITALS — BP 120/70 | HR 86 | Temp 98.4°F | Ht 67.0 in | Wt 192.8 lb

## 2017-12-22 DIAGNOSIS — M170 Bilateral primary osteoarthritis of knee: Secondary | ICD-10-CM

## 2017-12-22 DIAGNOSIS — I10 Essential (primary) hypertension: Secondary | ICD-10-CM | POA: Diagnosis not present

## 2017-12-22 MED ORDER — LIDOCAINE 5 % EX PTCH: 1.0000 | MEDICATED_PATCH | CUTANEOUS | 0 refills | Status: AC

## 2017-12-22 NOTE — Progress Notes (Signed)
    Subjective:  Gregory [name] is a 57 y.o. male who presents to the [facility] today with a chief complaint of HTN followup.   HPI: Patient has been taking losartan (had coughing on lisinopril) regularly with no complications.  He says he has switched from normal table salt to pink himalayan and not using as much because it is more expensive.  No noticed weight/activity changes.  Here at my request to recheck labs and see bp  Patient also has chronic knee pain 2/2 to knee surgeries.  Still ambulates well, no new infectious symptoms and no new injury.  Woyuld like to discuss pain control but is not requesting narcotics   Objective:  Physical Exam: BP 120/70   Pulse 86   Temp 98.4 F (36.9 C) (Oral)   Ht 5\' 7"  (1.702 m)   Wt 192 lb 12.8 oz (87.5 kg)   SpO2 94%   BMI 30.20 kg/m   Gen: NAD, resting comfortably CV: RRR with no murmurs appreciated Pulm: NWOB, CTAB with no crackles, wheezes, or rhonchi GI: Normal bowel sounds present. Soft, Nontender, Nondistended. MSK: no edema, cyanosis, or clubbing noted.  Does have significant scarring from past left knee procedures Skin: warm, dry Neuro: grossly normal, moves all extremities Psych: Normal affect and thought content  Results for orders placed or performed in visit on 12/22/17 (from the past 72 hour(s))  Basic Metabolic Panel     Status: Abnormal   Collection Time: 12/22/17  3:25 PM  Result Value Ref Range   Glucose 100 (H) 65 - 99 mg/dL   BUN 15 6 - 24 mg/dL   Creatinine, Ser 1.10 0.76 - 1.27 mg/dL   GFR calc non Af Amer 73 >59 mL/min/1.73   GFR calc Af Amer 84 >59 mL/min/1.73   BUN/Creatinine Ratio 14 9 - 20   Sodium 139 134 - 144 mmol/L   Potassium 4.3 3.5 - 5.2 mmol/L   Chloride 103 96 - 106 mmol/L   CO2 21 20 - 29 mmol/L   Calcium 9.1 8.7 - 10.2 mg/dL     Assessment/Plan:  Essential hypertension Well controlled on low dose losartan.  No new changes  Osteoarthritis Still with chronic pain.  Does not want narcotics and  does not want to see therapy.  Says he had success in past with lidocaine patch so we will order that.  No erythema/new injury/cellulitis   Sherene Sires, DO FAMILY MEDICINE RESIDENT - PGY2 12/24/2017 1:38 PM

## 2017-12-23 LAB — BASIC METABOLIC PANEL
BUN/Creatinine Ratio: 14 (ref 9–20)
BUN: 15 mg/dL (ref 6–24)
CALCIUM: 9.1 mg/dL (ref 8.7–10.2)
CO2: 21 mmol/L (ref 20–29)
CREATININE: 1.1 mg/dL (ref 0.76–1.27)
Chloride: 103 mmol/L (ref 96–106)
GFR calc non Af Amer: 73 mL/min/{1.73_m2} (ref >59)
GFR, EST AFRICAN AMERICAN: 84 mL/min/{1.73_m2} (ref >59)
Glucose: 100 mg/dL — ABNORMAL HIGH (ref 65–99)
Potassium: 4.3 mmol/L (ref 3.5–5.2)
Sodium: 139 mmol/L (ref 134–144)

## 2017-12-24 NOTE — Assessment & Plan Note (Signed)
Well controlled on low dose losartan.  No new changes

## 2017-12-24 NOTE — Assessment & Plan Note (Signed)
Still with chronic pain.  Does not want narcotics and does not want to see therapy.  Says he had success in past with lidocaine patch so we will order that.  No erythema/new injury/cellulitis

## 2017-12-25 ENCOUNTER — Telehealth: Payer: Self-pay | Admitting: *Deleted

## 2017-12-25 NOTE — Telephone Encounter (Signed)
Medication denied.  No reason available on Covermymeds.

## 2017-12-25 NOTE — Telephone Encounter (Signed)
Received fax from pharmacy, PA needed on lidocaine patches.  Clinical questions submitted via Cover My Meds.  Waiting on response, could take up to 72 hours.  Cover My Meds info:  Key: [id number]   Gregory [name], Gregory [name], Gregory [name].

## 2018-01-06 ENCOUNTER — Other Ambulatory Visit: Payer: Self-pay | Admitting: Family Medicine

## 2018-01-06 DIAGNOSIS — I10 Essential (primary) hypertension: Secondary | ICD-10-CM

## 2018-01-27 ENCOUNTER — Other Ambulatory Visit: Payer: Self-pay | Admitting: Family Medicine

## 2018-01-27 DIAGNOSIS — G43009 Migraine without aura, not intractable, without status migrainosus: Secondary | ICD-10-CM

## 2018-01-29 ENCOUNTER — Encounter: Payer: Self-pay | Admitting: Family Medicine

## 2018-01-29 ENCOUNTER — Ambulatory Visit (INDEPENDENT_AMBULATORY_CARE_PROVIDER_SITE_OTHER): Payer: Medicare HMO | Admitting: Family Medicine

## 2018-01-29 VITALS — BP 132/92 | Ht 67.0 in | Wt 196.0 lb

## 2018-01-29 DIAGNOSIS — M5410 Radiculopathy, site unspecified: Secondary | ICD-10-CM | POA: Diagnosis not present

## 2018-01-29 MED ORDER — PREDNISONE 50 MG PO TABS
50.0000 mg | ORAL_TABLET | Freq: Every day | ORAL | 0 refills | Status: DC
Start: 2018-01-29 — End: 2018-03-30

## 2018-01-29 NOTE — Progress Notes (Signed)
[place] Attending Note: I have seen and examined this patient. I have discussed this patient with the resident and reviewed the assessment and plan as documented above. I agree with the resident's findings and plan. He has bad back disease and has had cervical spine disease so I suspect his current leg pain is related to lumbar pathology. His wife just had surgery so he has been doing more at home, recalss some episode of acute low back pain a few days ago. Will do steroid burst and f/u 1 week. Reviewed his most recent LS MRI---if he has no improvement would consider repeat MRI, plus minus gabapentin type treatement.
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
## 2018-01-29 NOTE — Patient Instructions (Signed)
For the next week take it easy, no heavy lifting. We have called in some prednisone for you to take daily, take it with food. We will see you back next week to re-check your back, we are hoping you'll be feeling a little better by then.

## 2018-02-05 ENCOUNTER — Ambulatory Visit (INDEPENDENT_AMBULATORY_CARE_PROVIDER_SITE_OTHER): Payer: Medicare HMO | Admitting: Family Medicine

## 2018-02-05 ENCOUNTER — Encounter: Payer: Self-pay | Admitting: Family Medicine

## 2018-02-05 ENCOUNTER — Other Ambulatory Visit: Payer: Self-pay | Admitting: Family Medicine

## 2018-02-05 VITALS — BP 157/100 | Ht 67.0 in | Wt 196.0 lb

## 2018-02-05 DIAGNOSIS — S0540XA Penetrating wound of orbit with or without foreign body, unspecified eye, initial encounter: Secondary | ICD-10-CM

## 2018-02-05 DIAGNOSIS — M5410 Radiculopathy, site unspecified: Secondary | ICD-10-CM

## 2018-02-05 NOTE — Progress Notes (Signed)
   CC: f/u r leg pain   HPI  R leg pain -he notes he has had significant improvement in his right leg pain, and some improvement in his right leg numbness.  No new injury since last week.  He denies any bowel or bladder incontinence or genital numbness.  He feels he is walking significantly better than previous.  However, his leg still completely goes numb with sitting.  He continues to walk "wobbly," according to his wife.  He has never had any back injections.  He states his back is hurting some today as well.  He notes he drives with the right leg, and it goes numb fairly soon after sitting down.  His wife was recently released to drive, and they will switch off with her driving now.  Additionally, they both feel that they would like to explore additional options for his back problem and will be open to either surgery or injections.  They are willing to proceed with repeat MRI.  ROS: Denies CP, SOB, abdominal pain, dysuria, changes in BMs.   CC, SH/smoking status, and VS noted  Objective: BP (!) 157/100   Ht 5\' 7"  (1.702 m)   Wt 196 lb (88.9 kg)   BMI 30.70 kg/m  Gen: NAD, alert, cooperative, and pleasant. HEENT: NCAT, EOMI, PERRL CV: RRR, no murmur Resp: CTAB, no wheezes, non-labored Gait: uneven gait (chronic due to limb length discrepancy), but without pain, no spinous tenderness over lumbar spine. Normal gross sensation over R leg.  Neuro: Alert and oriented, Speech clear, No gross deficits  Assessment and plan:  R lumbar radiculopathy -given prolonged symptoms only partially improved by prednisone, we will repeat lumbar MRI.  Pending results, would consider referral for either steroid injections or partial discectomy.  Dr. Nori Riis will call with MRI results.  No additional p.o. medications right now for the pain, could consider gabapentin in the future.  Again recommended that he follow-up with his PCP for blood pressure.  Orders Placed This Encounter  Procedures  . MR Lumbar  Spine Wo Contrast    Standing Status:   Future    Standing Expiration Date:   04/09/2019    Order Specific Question:   ** REASON FOR EXAM (FREE TEXT)    Answer:   Radicular pain of right lower extremity    Order Specific Question:   What is the patient's sedation requirement?    Answer:   No Sedation    Order Specific Question:   Does the patient have a pacemaker or implanted devices?    Answer:   No    Order Specific Question:   Preferred imaging location?    Answer:   [facility] (table limit-550lbs)    Order Specific Question:   Radiology Contrast Protocol - do NOT remove file path    Answer:   \\[email or web address]    Ralene Ok, MD, PGY3 02/05/2018 9:50 AM

## 2018-02-05 NOTE — Progress Notes (Signed)
[place] Attending Note: I have seen and examined this patient. I have discussed this patient with the resident and reviewed the assessment and plan as documented above. I agree with the resident's findings and plan. We will get the MRI.  I suspect a small disc fragment he had previously noted last MRI is causing continued problems.  He might benefit from microdiscectomy.  I will call him after the MRI he did have significant improvement after the steroids.

## 2018-02-11 ENCOUNTER — Other Ambulatory Visit: Payer: Self-pay | Admitting: Family Medicine

## 2018-02-11 DIAGNOSIS — G4700 Insomnia, unspecified: Secondary | ICD-10-CM

## 2018-02-15 ENCOUNTER — Other Ambulatory Visit: Payer: Self-pay | Admitting: Family Medicine

## 2018-02-18 ENCOUNTER — Inpatient Hospital Stay: Admission: RE | Admit: 2018-02-18 | Payer: Self-pay | Source: Ambulatory Visit

## 2018-02-18 ENCOUNTER — Other Ambulatory Visit: Payer: Self-pay

## 2018-02-18 ENCOUNTER — Telehealth: Payer: Self-pay | Admitting: *Deleted

## 2018-02-18 NOTE — Telephone Encounter (Signed)
-  Per Dr Nori Riis, we have patient see Dr Lynann Bologna at [facility] for his right lower leg/back pain.    -Informed patient's wife of the new plan with the Ortho referral.   -Pt is to see Dr Lynann Bologna ([place]) on Monday Jan. 6th at 330pm. He is to arrive at 245p to fill out new pt paperwork. [phone].   -Faxed his recent note and 2018 MRI to Bethena Roys at [facility] at [phone].    -Informed Rosalyn of the patients appt with Dr Lynann Bologna on Monday and she expressed understand and approval

## 2018-02-19 ENCOUNTER — Other Ambulatory Visit: Payer: Self-pay

## 2018-02-21 ENCOUNTER — Ambulatory Visit (HOSPITAL_COMMUNITY)
Admission: EM | Admit: 2018-02-21 | Discharge: 2018-02-21 | Disposition: A | Discharge status: Home / Self Care | Payer: Medicare HMO | Source: Home / Self Care

## 2018-02-21 ENCOUNTER — Other Ambulatory Visit: Payer: Self-pay

## 2018-02-21 ENCOUNTER — Encounter (HOSPITAL_COMMUNITY): Payer: Self-pay | Admitting: Emergency Medicine

## 2018-02-21 ENCOUNTER — Emergency Department (HOSPITAL_COMMUNITY)
Admission: EM | Admit: 2018-02-21 | Discharge: 2018-02-21 | Disposition: A | Discharge status: Home / Self Care | Payer: Medicare HMO | Attending: Emergency Medicine | Admitting: Emergency Medicine

## 2018-02-21 DIAGNOSIS — Z5321 Procedure and treatment not carried out due to patient leaving prior to being seen by health care provider: Secondary | ICD-10-CM | POA: Diagnosis not present

## 2018-02-21 DIAGNOSIS — R109 Unspecified abdominal pain: Secondary | ICD-10-CM | POA: Insufficient documentation

## 2018-02-21 LAB — CBC
HEMATOCRIT: 39.7 % (ref 39.0–52.0)
HEMOGLOBIN: 13.2 g/dL (ref 13.0–17.0)
MCH: 29.3 pg (ref 26.0–34.0)
MCHC: 33.2 g/dL (ref 30.0–36.0)
MCV: 88.2 fL (ref 80.0–100.0)
NRBC: 0 % (ref 0.0–0.2)
Platelets: 278 10*3/uL (ref 150–400)
RBC: 4.5 MIL/uL (ref 4.22–5.81)
RDW: 12 % (ref 11.5–15.5)
WBC: 9 10*3/uL (ref 4.0–10.5)

## 2018-02-21 LAB — COMPREHENSIVE METABOLIC PANEL
ALT: 43 U/L (ref 0–44)
ANION GAP: 8 (ref 5–15)
AST: 34 U/L (ref 15–41)
Albumin: 3.8 g/dL (ref 3.5–5.0)
Alkaline Phosphatase: 99 U/L (ref 38–126)
BILIRUBIN TOTAL: 0.7 mg/dL (ref 0.3–1.2)
BUN: 13 mg/dL (ref 6–20)
CHLORIDE: 105 mmol/L (ref 98–111)
CO2: 23 mmol/L (ref 22–32)
Calcium: 9 mg/dL (ref 8.9–10.3)
Creatinine, Ser: 1.09 mg/dL (ref 0.61–1.24)
GFR calc Af Amer: >60 mL/min (ref >60)
Glucose, Bld: 104 mg/dL — ABNORMAL HIGH (ref 70–99)
Potassium: 3.9 mmol/L (ref 3.5–5.1)
Sodium: 136 mmol/L (ref 135–145)
TOTAL PROTEIN: 7.5 g/dL (ref 6.5–8.1)

## 2018-02-21 LAB — LIPASE, BLOOD: LIPASE: 42 U/L (ref 11–51)

## 2018-02-21 NOTE — ED Triage Notes (Signed)
Left sided abd pain hurts to move , that is when pain is  Worse , having back pain also, denies dysuria ,  Last bm today it was loose, wife states they have been loose , denies n/v , hurts to cough

## 2018-02-21 NOTE — ED Notes (Signed)
Pt and family member seen walking out of ED doors. This NT attempted to speak to pt when family member stated the pt did not want to stay and wait. Encouraged pt to stay and pt walked out.

## 2018-02-21 NOTE — ED Notes (Signed)
Upon being called from waiting area, pt unable to stand upright, grunting with pain with every movement; having difficulty getting in wheelchair.  Spouse states pt woke in the middle of the night, and when he coughed felt severe pain in left abd.  Denies any fevers.  Discussed with ATasia Catchings, PA; instructed pt and spouse to go to ED for further eval.  Pt assisted to ED via wheelchair with RN.

## 2018-02-27 ENCOUNTER — Other Ambulatory Visit: Payer: Self-pay | Admitting: Family Medicine

## 2018-02-27 DIAGNOSIS — M545 Low back pain: Secondary | ICD-10-CM | POA: Diagnosis not present

## 2018-02-27 DIAGNOSIS — I10 Essential (primary) hypertension: Secondary | ICD-10-CM

## 2018-02-27 DIAGNOSIS — M5416 Radiculopathy, lumbar region: Secondary | ICD-10-CM | POA: Diagnosis not present

## 2018-03-08 DIAGNOSIS — H524 Presbyopia: Secondary | ICD-10-CM | POA: Diagnosis not present

## 2018-03-11 ENCOUNTER — Other Ambulatory Visit: Payer: Self-pay | Admitting: Orthopedic Surgery

## 2018-03-11 DIAGNOSIS — M5416 Radiculopathy, lumbar region: Secondary | ICD-10-CM

## 2018-03-17 ENCOUNTER — Ambulatory Visit
Admission: RE | Admit: 2018-03-17 | Discharge: 2018-03-17 | Disposition: A | Discharge status: Home / Self Care | Payer: Medicare HMO | Source: Ambulatory Visit | Attending: Orthopedic Surgery | Admitting: Orthopedic Surgery

## 2018-03-17 DIAGNOSIS — M5416 Radiculopathy, lumbar region: Secondary | ICD-10-CM

## 2018-03-17 DIAGNOSIS — M542 Cervicalgia: Secondary | ICD-10-CM | POA: Diagnosis not present

## 2018-03-22 DIAGNOSIS — M5416 Radiculopathy, lumbar region: Secondary | ICD-10-CM | POA: Diagnosis not present

## 2018-03-30 ENCOUNTER — Encounter: Payer: Self-pay | Admitting: Family Medicine

## 2018-03-30 ENCOUNTER — Ambulatory Visit (INDEPENDENT_AMBULATORY_CARE_PROVIDER_SITE_OTHER): Payer: Medicare HMO | Admitting: Family Medicine

## 2018-03-30 VITALS — BP 129/79 | Ht 67.0 in | Wt 204.0 lb

## 2018-03-30 DIAGNOSIS — M5416 Radiculopathy, lumbar region: Secondary | ICD-10-CM | POA: Diagnosis not present
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
## 2018-03-30 NOTE — Patient Instructions (Addendum)
Thank you for coming in to see [place] today. Please see below to review our plan for today's visit.  We will get you in to see [facility] to receive back injections. Your pain will unlikely change until we attempt these injections. Follow-up with [place] as needed.  Harriet Butte, Bee, PGY-3

## 2018-03-30 NOTE — Addendum Note (Signed)
Addended by: Dene Gentry on: 03/30/2018 08:16 PM   Modules accepted: Orders, Level of Service

## 2018-03-30 NOTE — Assessment & Plan Note (Signed)
Chronic.  Does have symptoms consistent with radiculopathy.  MRI with moderate to severe foraminal stenosis without central spinal stenosis.  Would likely benefit from epidural injection which patient is amenable to. - Discussed conservative management and continue tramadol and muscle relaxer - Ambulatory referral to orthopedics for ESI - Reviewed return precautions, RTC as needed

## 2018-03-31 ENCOUNTER — Other Ambulatory Visit: Payer: Self-pay | Admitting: Family Medicine

## 2018-03-31 DIAGNOSIS — M5416 Radiculopathy, lumbar region: Secondary | ICD-10-CM

## 2018-04-02 ENCOUNTER — Ambulatory Visit: Payer: Medicare HMO | Admitting: Family Medicine

## 2018-04-02 ENCOUNTER — Other Ambulatory Visit: Payer: Self-pay | Admitting: *Deleted

## 2018-04-02 DIAGNOSIS — M5126 Other intervertebral disc displacement, lumbar region: Secondary | ICD-10-CM

## 2018-04-02 DIAGNOSIS — M5416 Radiculopathy, lumbar region: Secondary | ICD-10-CM

## 2018-04-26 ENCOUNTER — Telehealth: Payer: Self-pay

## 2018-04-26 ENCOUNTER — Other Ambulatory Visit: Payer: Self-pay | Admitting: Family Medicine

## 2018-04-26 MED ORDER — VALSARTAN 80 MG PO TABS: 80.0000 mg | ORAL_TABLET | Freq: Every day | ORAL | 0 refills | Status: DC

## 2018-04-26 NOTE — Progress Notes (Signed)
Valsartan 80 rx'd as losartan is on back order.   DO NOT take with losartan.  -Dr. Elven Laboy

## 2018-04-26 NOTE — Telephone Encounter (Signed)
Fax from pharmacy that Losartan is on backorder. Please send alternative.  Danley Danker, RN [facility] RN)

## 2018-04-27 NOTE — Telephone Encounter (Signed)
Sent on separate encounter.  Danley Danker, RN [facility] RN)

## 2018-04-28 ENCOUNTER — Ambulatory Visit (INDEPENDENT_AMBULATORY_CARE_PROVIDER_SITE_OTHER): Payer: Medicare HMO

## 2018-04-28 ENCOUNTER — Encounter (INDEPENDENT_AMBULATORY_CARE_PROVIDER_SITE_OTHER): Payer: Self-pay | Admitting: Specialist

## 2018-04-28 ENCOUNTER — Ambulatory Visit (INDEPENDENT_AMBULATORY_CARE_PROVIDER_SITE_OTHER): Payer: Medicare HMO | Admitting: Specialist

## 2018-04-28 ENCOUNTER — Other Ambulatory Visit: Payer: Self-pay

## 2018-04-28 VITALS — BP 152/103 | HR 69 | Ht 67.0 in | Wt 204.0 lb

## 2018-04-28 DIAGNOSIS — M47816 Spondylosis without myelopathy or radiculopathy, lumbar region: Secondary | ICD-10-CM

## 2018-04-28 DIAGNOSIS — G4730 Sleep apnea, unspecified: Secondary | ICD-10-CM | POA: Diagnosis not present

## 2018-04-28 DIAGNOSIS — M5416 Radiculopathy, lumbar region: Secondary | ICD-10-CM

## 2018-04-28 DIAGNOSIS — G8929 Other chronic pain: Secondary | ICD-10-CM | POA: Diagnosis not present

## 2018-04-28 DIAGNOSIS — E785 Hyperlipidemia, unspecified: Secondary | ICD-10-CM | POA: Diagnosis not present

## 2018-04-28 DIAGNOSIS — M2170 Unequal limb length (acquired), unspecified site: Secondary | ICD-10-CM | POA: Diagnosis not present

## 2018-04-28 DIAGNOSIS — G43909 Migraine, unspecified, not intractable, without status migrainosus: Secondary | ICD-10-CM | POA: Diagnosis not present

## 2018-04-28 DIAGNOSIS — Z008 Encounter for other general examination: Secondary | ICD-10-CM | POA: Diagnosis not present

## 2018-04-28 DIAGNOSIS — I209 Angina pectoris, unspecified: Secondary | ICD-10-CM | POA: Diagnosis not present

## 2018-04-28 DIAGNOSIS — M48062 Spinal stenosis, lumbar region with neurogenic claudication: Secondary | ICD-10-CM

## 2018-04-28 DIAGNOSIS — I739 Peripheral vascular disease, unspecified: Secondary | ICD-10-CM | POA: Diagnosis not present

## 2018-04-28 DIAGNOSIS — R69 Illness, unspecified: Secondary | ICD-10-CM | POA: Diagnosis not present

## 2018-04-28 DIAGNOSIS — E669 Obesity, unspecified: Secondary | ICD-10-CM | POA: Diagnosis not present

## 2018-04-28 DIAGNOSIS — G4700 Insomnia, unspecified: Secondary | ICD-10-CM | POA: Diagnosis not present

## 2018-04-28 MED ORDER — METHYLPREDNISOLONE 4 MG PO TABS: ORAL_TABLET | ORAL | 0 refills | Status: DC

## 2018-04-28 NOTE — Patient Instructions (Signed)
Avoid bending, stooping and avoid lifting weights greater than 10 lbs. Avoid prolong standing and walking. Avoid frequent bending and stooping  No lifting greater than 10 lbs. May use ice or moist heat for pain. Weight loss is of benefit. Handicap license is approved. Dr. Romona Curls secretary/Assistant will call to arrange for epidural steroid injection  A 1/2 " left shoe lift in addition to the present heel lift may decrease some of the narrowing of the opening for the right L3 nerve root and decrease some of the symptoms.
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
## 2018-05-05 ENCOUNTER — Telehealth: Payer: Self-pay | Admitting: Family Medicine

## 2018-05-05 NOTE — Telephone Encounter (Signed)
Patient called and was offered on the voicemail to call in and potentially handle some issues over the phone as opposed to needing to come in and expose himself as to risk of exposure of coronavirus here in the office.  We were clear that we were not refusing to see them and they were still can come in in person if they felt that was needed but that we are trying to create be creative and offer some potentially safer solutions.  Dr. Criss Rosales

## 2018-05-07 ENCOUNTER — Encounter: Payer: Self-pay | Admitting: Family Medicine

## 2018-05-07 ENCOUNTER — Ambulatory Visit (INDEPENDENT_AMBULATORY_CARE_PROVIDER_SITE_OTHER): Payer: Medicare HMO | Admitting: Family Medicine

## 2018-05-07 ENCOUNTER — Other Ambulatory Visit: Payer: Self-pay

## 2018-05-07 VITALS — BP 132/78 | HR 87 | Temp 97.9°F | Wt 200.2 lb

## 2018-05-07 DIAGNOSIS — R197 Diarrhea, unspecified: Secondary | ICD-10-CM | POA: Diagnosis not present

## 2018-05-07 DIAGNOSIS — J45909 Unspecified asthma, uncomplicated: Secondary | ICD-10-CM | POA: Diagnosis not present

## 2018-05-07 DIAGNOSIS — Z91018 Allergy to other foods: Secondary | ICD-10-CM

## 2018-05-07 DIAGNOSIS — Z789 Other specified health status: Secondary | ICD-10-CM

## 2018-05-07 MED ORDER — ALBUTEROL SULFATE HFA 108 (90 BASE) MCG/ACT IN AERS: 1.0000 | INHALATION_SPRAY | Freq: Four times a day (QID) | RESPIRATORY_TRACT | 0 refills | Status: DC | PRN

## 2018-05-07 MED ORDER — EPINEPHRINE 0.3 MG/0.3ML IJ SOAJ: 0.3000 mg | INTRAMUSCULAR | 2 refills | Status: DC | PRN

## 2018-05-07 MED ORDER — FLUTICASONE PROPIONATE HFA 110 MCG/ACT IN AERO: 2.0000 | INHALATION_SPRAY | Freq: Two times a day (BID) | RESPIRATORY_TRACT | 12 refills | Status: DC

## 2018-05-07 NOTE — Progress Notes (Signed)
Subjective:  Gregory [name] is a 59 y.o. male who presents to the [facility] today with a chief complaint of diarrhea symptoms.   HPI: Diarrhea Minor and intermittent every few days.  Bellyache with repeated loose stools and diarrhea that he does calls upset stomach.  He has not had any sick symptoms beyond what appears to be a normal seasonal asthma exacerbation for him.  Upon further review he is drinking approximately 6 pack per night which we think is the most obvious potential cause for this.  He does not appear dehydrated he does actually take a reasonable amount of water and juice orally.  Nut allergy Patient had to use an EpiPen because he was eating somewhere where they cook the food and not well and he did not know that, he said that he was did well after just this 1 dose but now he does not have an EpiPen needs a refill  Alcohol consumption heavy Patient drinking approximately 6 pack of beer per night, we discussed that this may be impacting his complaint of diarrhea and is a higher than healthy amount for him.    Moderate asthma Patient with asthma which seem to seasonally be worse this time of year.  Recently he has been using his albuterol inhaler approximately twice per day has had a couple episodes of waking up at night feeling short of breath and using his asthma inhaler.  He says that inhaler has been helping him he has not had any productive cough he has not had any fever symptoms he has not had any other sick symptoms.  Objective:  Physical Exam: BP 132/78   Pulse 87   Temp 97.9 F (36.6 C) (Oral)   Wt 200 lb 3.2 oz (90.8 kg)   SpO2 92%   BMI 31.36 kg/m   Gen: NAD, conversing comfortably CV: RRR with no murmurs appreciated Pulm: NWOB, CTAB with no crackles, wheezes, or rhonchi GI: Normal bowel sounds present. Soft, Nontender, Nondistended. MSK: no edema, cyanosis, or clubbing noted Skin: warm, dry Neuro: grossly normal, moves all extremities Psych: Normal affect  and thought content  No results found for this or any previous visit (from the past 72 hour(s)).   Assessment/Plan:  Nut allergy Patient had to use an EpiPen because he was eating somewhere where they cook the food and not well and he did not know that, he said that he was did well after just this 1 dose but now he does not have an EpiPen needs a refill  We went over again the risks of exposure to nuts for him, the need to always have his EpiPen with him, and refilled his EpiPen  Diarrhea Minor and intermittent every few days.  Bellyache with repeated loose stools and diarrhea that he does calls upset stomach.  He has not had any sick symptoms beyond what appears to be a normal seasonal asthma exacerbation for him.  Upon further review he is drinking approximately 6 pack per night which we think is the most obvious potential cause for this.  We advised him to try to cut down towards 1 or 2 beers per night to see if that changes his symptoms.  He does not appear dehydrated he does actually take a reasonable amount of water and juice orally.  Alcohol consumption heavy Patient drinking approximately 6 pack of beer per night, we discussed that this may be impacting his complaint of diarrhea and is a higher than healthy amount for him.  He  agrees to try to cut down to 1-2 beers per night to see if that helps the symptoms  Moderate asthma Patient with asthma which seem to seasonally be worse this time of year.  Recently he has been using his albuterol inhaler approximately twice per day has had a couple episodes of waking up at night feeling short of breath and using his asthma inhaler.  He says that inhaler has been helping him he has not had any productive cough he has not had any fever symptoms he has not had any other sick symptoms.  We discussed at length the difference doing a controller medicine and emergency medicine, we are going to add Flovent as a controller.  We did discuss that this may  not be a year-round controller for him but should potentially help reduce the need for his albuterol.  We did discuss the importance of going to the emergency room if he feels the albuterol is not helping him and he is in a crisis but that if he is having only moderate symptoms it might be safer for him to contact us about additional medication on an outpatient basis as opposed to going to the emergency department where he could be exposed to coronavirus at this time, given his age and lung symptoms he would have a much higher risk of significantly bad outcomes.   Sherene Sires, DO FAMILY MEDICINE RESIDENT - PGY2 05/08/2018 12:08 PM

## 2018-05-08 ENCOUNTER — Other Ambulatory Visit: Payer: Self-pay | Admitting: Family Medicine

## 2018-05-08 DIAGNOSIS — J45909 Unspecified asthma, uncomplicated: Secondary | ICD-10-CM | POA: Insufficient documentation

## 2018-05-08 DIAGNOSIS — E785 Hyperlipidemia, unspecified: Secondary | ICD-10-CM

## 2018-05-08 DIAGNOSIS — R197 Diarrhea, unspecified: Secondary | ICD-10-CM | POA: Insufficient documentation

## 2018-05-08 DIAGNOSIS — Z789 Other specified health status: Secondary | ICD-10-CM | POA: Insufficient documentation

## 2018-05-08 NOTE — Assessment & Plan Note (Signed)
Patient with asthma which seem to seasonally be worse this time of year.  Recently he has been using his albuterol inhaler approximately twice per day has had a couple episodes of waking up at night feeling short of breath and using his asthma inhaler.  He says that inhaler has been helping him he has not had any productive cough he has not had any fever symptoms he has not had any other sick symptoms.  We discussed at length the difference doing a controller medicine and emergency medicine, we are going to add Flovent as a controller.  We did discuss that this may not be a year-round controller for him but should potentially help reduce the need for his albuterol.  We did discuss the importance of going to the emergency room if he feels the albuterol is not helping him and he is in a crisis but that if he is having only moderate symptoms it might be safer for him to contact us about additional medication on an outpatient basis as opposed to going to the emergency department where he could be exposed to coronavirus at this time, given his age and lung symptoms he would have a much higher risk of significantly bad outcomes.

## 2018-05-08 NOTE — Assessment & Plan Note (Signed)
Patient drinking approximately 6 pack of beer per night, we discussed that this may be impacting his complaint of diarrhea and is a higher than healthy amount for him.  He agrees to try to cut down to 1-2 beers per night to see if that helps the symptoms

## 2018-05-08 NOTE — Assessment & Plan Note (Signed)
Patient had to use an EpiPen because he was eating somewhere where they cook the food and not well and he did not know that, he said that he was did well after just this 1 dose but now he does not have an EpiPen needs a refill  We went over again the risks of exposure to nuts for him, the need to always have his EpiPen with him, and refilled his EpiPen

## 2018-05-08 NOTE — Assessment & Plan Note (Signed)
Minor and intermittent every few days.  Bellyache with repeated loose stools and diarrhea that he does calls upset stomach.  He has not had any sick symptoms beyond what appears to be a normal seasonal asthma exacerbation for him.  Upon further review he is drinking approximately 6 pack per night which we think is the most obvious potential cause for this.  We advised him to try to cut down towards 1 or 2 beers per night to see if that changes his symptoms.  He does not appear dehydrated he does actually take a reasonable amount of water and juice orally.

## 2018-05-10 ENCOUNTER — Telehealth: Payer: Self-pay | Admitting: Family Medicine

## 2018-05-10 DIAGNOSIS — Z789 Other specified health status: Secondary | ICD-10-CM

## 2018-05-10 MED ORDER — NALTREXONE HCL 50 MG PO TABS: 50.0000 mg | ORAL_TABLET | Freq: Every day | ORAL | 0 refills | Status: DC

## 2018-05-10 NOTE — Telephone Encounter (Signed)
After discussing case with preceptor, we discovered naltrexone would be a potentially beneficial augmentation to the patient's medical plan to help him cut down on his alcohol use which is currently at a 6 pack/day of beer.  Called the patient and spoke to him and his wife over the phone and they both wanted him to try naltrexone to help him cut back.  Dr. Criss Rosales

## 2018-05-12 ENCOUNTER — Other Ambulatory Visit: Payer: Self-pay | Admitting: Family Medicine

## 2018-05-12 DIAGNOSIS — G4700 Insomnia, unspecified: Secondary | ICD-10-CM

## 2018-05-13 ENCOUNTER — Telehealth: Payer: Self-pay | Admitting: Family Medicine

## 2018-05-13 NOTE — Telephone Encounter (Signed)
Pt says he was put on quarantine for 2 weeks by Dr Criss Rosales when he was at our office on Monday, 05/07/2018. His employer is saying they will pay him for those 2 weeks as long as he has a doctor's note stating he was put on quarantine by his doctor and for his health problems. Pt's wife said she could pick the note up whenever it's ready. Please call pt back at [phone].

## 2018-05-14 ENCOUNTER — Encounter: Payer: Self-pay | Admitting: Family Medicine

## 2018-05-14 NOTE — Telephone Encounter (Signed)
Pt wife called again checking on the status of this letter. Pt needs it by today at 5 PM to be able to get paid next week. Please call pt when this has been done.

## 2018-05-18 ENCOUNTER — Encounter (INDEPENDENT_AMBULATORY_CARE_PROVIDER_SITE_OTHER): Payer: Self-pay | Admitting: Physical Medicine and Rehabilitation

## 2018-05-24 ENCOUNTER — Other Ambulatory Visit: Payer: Self-pay | Admitting: Family Medicine

## 2018-06-10 ENCOUNTER — Other Ambulatory Visit: Payer: Self-pay | Admitting: Family Medicine

## 2018-06-10 ENCOUNTER — Encounter (INDEPENDENT_AMBULATORY_CARE_PROVIDER_SITE_OTHER): Payer: Self-pay | Admitting: Physical Medicine and Rehabilitation

## 2018-06-10 DIAGNOSIS — J45909 Unspecified asthma, uncomplicated: Secondary | ICD-10-CM

## 2018-06-16 ENCOUNTER — Other Ambulatory Visit: Payer: Self-pay | Admitting: Family Medicine

## 2018-06-16 DIAGNOSIS — F319 Bipolar disorder, unspecified: Secondary | ICD-10-CM

## 2018-06-17 ENCOUNTER — Other Ambulatory Visit (INDEPENDENT_AMBULATORY_CARE_PROVIDER_SITE_OTHER): Payer: Self-pay | Admitting: Specialist

## 2018-06-22 ENCOUNTER — Other Ambulatory Visit: Payer: Self-pay | Admitting: Family Medicine

## 2018-06-30 ENCOUNTER — Ambulatory Visit (INDEPENDENT_AMBULATORY_CARE_PROVIDER_SITE_OTHER): Payer: Medicare HMO | Admitting: Physical Medicine and Rehabilitation

## 2018-06-30 ENCOUNTER — Encounter: Payer: Self-pay | Admitting: Physical Medicine and Rehabilitation

## 2018-06-30 ENCOUNTER — Ambulatory Visit: Payer: Self-pay

## 2018-06-30 ENCOUNTER — Other Ambulatory Visit: Payer: Self-pay

## 2018-06-30 VITALS — BP 146/105 | HR 80

## 2018-06-30 DIAGNOSIS — M5416 Radiculopathy, lumbar region: Secondary | ICD-10-CM | POA: Diagnosis not present

## 2018-06-30 MED ORDER — DEXAMETHASONE SODIUM PHOSPHATE 10 MG/ML IJ SOLN
15.0000 mg | Freq: Once | INTRAMUSCULAR | Status: AC
  Administered 2018-06-30: 15 mg

## 2018-06-30 NOTE — Progress Notes (Signed)
 .  Numeric Pain Rating Scale and Functional Assessment Average Pain 6   In the last MONTH (on 0-10 scale) has pain interfered with the following?  1. General activity like being  able to carry out your everyday physical activities such as walking, climbing stairs, carrying groceries, or moving a chair?  Rating(4)   +Driver, -BT, -Dye Allergies.  

## 2018-07-05 ENCOUNTER — Telehealth: Payer: Self-pay | Admitting: Physical Medicine and Rehabilitation

## 2018-07-05 ENCOUNTER — Telehealth: Payer: Self-pay | Admitting: Specialist

## 2018-07-05 NOTE — Telephone Encounter (Signed)
Pt wife called in said the pt came in for some injections a few days ago with dr.newton and she says the injections have not helped at all, he happened to be in extreme pain this weekend.  [phone]

## 2018-07-05 NOTE — Telephone Encounter (Signed)
Pt wife called in said the pt came in for some injections a few days ago with dr.newton and she says the injections have not helped at all, he happened to be in extreme pain this weekend. ------Please advise, looks like he had an injection with Dr. Ernestina Patches on 06/30/2018

## 2018-07-05 NOTE — Telephone Encounter (Signed)
Noted! Thank you

## 2018-07-05 NOTE — Telephone Encounter (Signed)
Pts wife called stating after injections with Dr. Ernestina Patches pt is in extreme pain and had a rough weekend. Has an apt with [place] the 28th

## 2018-07-05 NOTE — Telephone Encounter (Signed)
I moved pt apt with [place] to the 20th

## 2018-07-07 ENCOUNTER — Other Ambulatory Visit: Payer: Self-pay

## 2018-07-07 ENCOUNTER — Encounter: Payer: Self-pay | Admitting: Specialist

## 2018-07-07 ENCOUNTER — Ambulatory Visit (INDEPENDENT_AMBULATORY_CARE_PROVIDER_SITE_OTHER): Payer: Medicare HMO | Admitting: Specialist

## 2018-07-07 VITALS — BP 152/95 | HR 57 | Ht 67.0 in | Wt 204.0 lb

## 2018-07-07 DIAGNOSIS — M5116 Intervertebral disc disorders with radiculopathy, lumbar region: Secondary | ICD-10-CM | POA: Diagnosis not present

## 2018-07-07 DIAGNOSIS — Z981 Arthrodesis status: Secondary | ICD-10-CM

## 2018-07-07 DIAGNOSIS — M48062 Spinal stenosis, lumbar region with neurogenic claudication: Secondary | ICD-10-CM | POA: Diagnosis not present

## 2018-07-07 DIAGNOSIS — Z9889 Other specified postprocedural states: Secondary | ICD-10-CM | POA: Diagnosis not present

## 2018-07-07 DIAGNOSIS — M5417 Radiculopathy, lumbosacral region: Secondary | ICD-10-CM

## 2018-07-07 MED ORDER — TRAMADOL HCL 50 MG PO TABS: 100.0000 mg | ORAL_TABLET | Freq: Four times a day (QID) | ORAL | 0 refills | Status: AC | PRN

## 2018-07-07 MED ORDER — TIZANIDINE HCL 4 MG PO TABS: 4.0000 mg | ORAL_TABLET | Freq: Four times a day (QID) | ORAL | 0 refills | Status: DC | PRN
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
## 2018-07-07 NOTE — Patient Instructions (Signed)
Avoid bending, stooping and avoid lifting weights greater than 10 lbs. Avoid prolong standing and walking. Order for a new walker with wheels. Surgery scheduling secretary Kandice Hams, will call you in the next week to schedule for surgery.  Surgery recommended is a right L5-S1 microdiscectomy this would be done with microscope. Take tramadol for for pain. Risk of surgery includes risk of infection 1 in 200 patients, bleeding 1/2% chance you would need a transfusion.   Risk to the nerves is one in 10,000.  Expect improved walking and standing tolerance. Expect relief of leg pain but numbness may persist depending on the length and degree of pressure that has been present.

## 2018-07-08 ENCOUNTER — Encounter: Payer: Self-pay | Admitting: Specialist

## 2018-07-15 ENCOUNTER — Telehealth: Payer: Self-pay | Admitting: *Deleted

## 2018-07-15 ENCOUNTER — Ambulatory Visit: Payer: Medicare HMO | Admitting: Specialist

## 2018-07-15 NOTE — Telephone Encounter (Signed)
Pts wife called inquiring about the surgical clearance form faxed to Dr. Criss Rosales.  Form is in providers box.  Advised I would send message to MD.  Wife is anxious because back surgery cant be scheduled until form is received.   Christen Bame, CMA

## 2018-07-16 ENCOUNTER — Telehealth: Payer: Self-pay | Admitting: *Deleted

## 2018-07-16 NOTE — Telephone Encounter (Signed)
Called pt's mobile number and spoke with pt's wife who states that pt was not available at this time. Pt's wife asked to have pt return call when available. Pt will need to be offered COVID-19 testing due to potential exposure during recent visit.

## 2018-07-19 NOTE — Telephone Encounter (Signed)
Completed by Dr. Criss Rosales and placed in to be faxed pile.  Christen Bame, CMA

## 2018-07-29 NOTE — Progress Notes (Signed)
Gregory [name] - 58 y.o. male MRN [id number]  Date of birth: 1960/11/19  Office Visit Note: Visit Date: 06/30/2018 PCP: Sherene Sires, DO Referred by: Sherene Sires, DO  Subjective: Chief Complaint  Patient presents with  . Lower Back - Pain  . Right Leg - Pain  . Right Foot - Numbness   HPI:  Gregory [name] [place] is a 58 y.o. male who comes in today At the request of Dr. [name] for right L3 transforaminal epidural steroid injection for chronic worsening severe right hip and leg pain which is failed conservative care.  ROS Otherwise per HPI.  Assessment & Plan: Visit Diagnoses:  1. Lumbar radiculopathy     Plan: No additional findings.   Meds & Orders:  Meds ordered this encounter  Medications  . dexamethasone (DECADRON) injection 15 mg    Orders Placed This Encounter  Procedures  . XR C-ARM NO REPORT  . Epidural Steroid injection    Follow-up: Return in about 2 weeks (around 07/14/2018) for [name], MD.   Procedures: No procedures performed  Lumbosacral Transforaminal Epidural Steroid Injection - Sub-Pedicular Approach with Fluoroscopic Guidance  Patient: [name] Gregory      Date of Birth: August 13, 1960 MRN: [id number] PCP: Sherene Sires, DO      Visit Date: 06/30/2018   Universal Protocol:    Date/Time: 06/30/2018  Consent Given By: the patient  Position: PRONE  Additional Comments: Vital signs were monitored before and after the procedure. Patient was prepped and draped in the usual sterile fashion. The correct patient, procedure, and site was verified.   Injection Procedure Details:  Procedure Site One Meds Administered:  Meds ordered this encounter  Medications  . dexamethasone (DECADRON) injection 15 mg    Laterality: Right  Location/Site:  L3-L4  Needle size: 46 G  Needle type: Spinal  Needle Placement: Transforaminal  Findings:    -Comments: Excellent flow of contrast along the nerve and into the epidural space.  Procedure Details:  After squaring off the end-plates to get a true AP view, the C-arm was positioned so that an oblique view of the foramen as noted above was visualized. The target area is just inferior to the "nose of the scotty dog" or sub pedicular. The soft tissues overlying this structure were infiltrated with 2-3 ml. of 1% Lidocaine without Epinephrine.  The spinal needle was inserted toward the target using a "trajectory" view along the fluoroscope beam.  Under AP and lateral visualization, the needle was advanced so it did not puncture dura and was located close the 6 O'Clock position of the pedical in AP tracterory. Biplanar projections were used to confirm position. Aspiration was confirmed to be negative for CSF and/or blood. A 1-2 ml. volume of Isovue-250 was injected and flow of contrast was noted at each level. Radiographs were obtained for documentation purposes.   After attaining the desired flow of contrast documented above, a 0.5 to 1.0 ml test dose of 0.25% Marcaine was injected into each respective transforaminal space.  The patient was observed for 90 seconds post injection.  After no sensory deficits were reported, and normal lower extremity motor function was noted,   the above injectate was administered so that equal amounts of the injectate were placed at each foramen (level) into the transforaminal epidural space.   Additional Comments:  The patient tolerated the procedure well Dressing: 2 x 2 sterile gauze and Band-Aid    Post-procedure details: Patient was observed during the procedure. Post-procedure instructions were reviewed.  Patient left the clinic in stable condition.    Clinical History: MRI LUMBAR SPINE WITHOUT CONTRAST  TECHNIQUE: Multiplanar, multisequence MR imaging of the lumbar spine was performed. No intravenous contrast was administered.  COMPARISON:  MRI lumbar spine 11/16/2016.  FINDINGS: Segmentation:  Standard.  Alignment:  Maintained.  Vertebrae:   No fracture or worrisome lesion.  Conus medullaris and cauda equina: Conus extends to the T12-L1 level. Conus and cauda equina appear normal.  Paraspinal and other soft tissues: Negative.  Disc levels:  T10-11 is imaged in the sagittal plane only and negative.  T11-12: Negative.  T12-L1: Negative.  L1-2: Negative.  L2-3: Epidural fat is prominent.  Minimal disc bulge.  No stenosis.  L3-4: Shallow disc bulge, shallow disc bulge, mild ligamentum flavum thickening and somewhat prominent epidural fat. The central canal is patent. Moderately severe bilateral foraminal narrowing is present. The appearance is unchanged.  L4-5: Shallow disc bulge and moderate facet degenerative disease. The central canal is open. Mild bilateral foraminal narrowing is seen. The appearance is unchanged.  L5-S1: Shallow right paracentral protrusion contacts the right S1 root without compression or displacement. Epidural fat is prominent. Foramina are open.  IMPRESSION: No change in the appearance of the lumbar spine.  Moderately severe bilateral foraminal narrowing at L3-4. The central canal is open at this level.  Mild bilateral foraminal narrowing at L4-5. No central canal stenosis.  Right paracentral protrusion at L5-S1 contacts the descending right S1 root without compression or displacement.  Epidural lipomatosis.   Electronically Signed   By: Inge Rise M.D.   On: 03/17/2018 15:47     Objective:  VS:  HT:    WT:   BMI:     BP:(!) 146/105  HR:80bpm  TEMP: ( )  RESP:  Physical Exam  Ortho Exam Imaging: No results found.

## 2018-07-29 NOTE — Procedures (Signed)
Lumbosacral Transforaminal Epidural Steroid Injection - Sub-Pedicular Approach with Fluoroscopic Guidance  [name]      Date of Birth: 01-Nov-1960 MRN: [id number] PCP: Sherene Sires, DO      Visit Date: 06/30/2018   Universal Protocol:    Date/Time: 06/30/2018  Consent Given By: the patient  Position: PRONE  Additional Comments: Vital signs were monitored before and after the procedure. Patient was prepped and draped in the usual sterile fashion. The correct patient, procedure, and site was verified.   Injection Procedure Details:  Procedure Site One Meds Administered:  Meds ordered this encounter  Medications  . dexamethasone (DECADRON) injection 15 mg    Laterality: Right  Location/Site:  L3-L4  Needle size: 69 G  Needle type: Spinal  Needle Placement: Transforaminal  Findings:    -Comments: Excellent flow of contrast along the nerve and into the epidural space.  Procedure Details: After squaring off the end-plates to get a true AP view, the C-arm was positioned so that an oblique view of the foramen as noted above was visualized. The target area is just inferior to the "nose of the scotty dog" or sub pedicular. The soft tissues overlying this structure were infiltrated with 2-3 ml. of 1% Lidocaine without Epinephrine.  The spinal needle was inserted toward the target using a "trajectory" view along the fluoroscope beam.  Under AP and lateral visualization, the needle was advanced so it did not puncture dura and was located close the 6 O'Clock position of the pedical in AP tracterory. Biplanar projections were used to confirm position. Aspiration was confirmed to be negative for CSF and/or blood. A 1-2 ml. volume of Isovue-250 was injected and flow of contrast was noted at each level. Radiographs were obtained for documentation purposes.   After attaining the desired flow of contrast documented above, a 0.5 to 1.0 ml test dose of 0.25% Marcaine was  injected into each respective transforaminal space.  The patient was observed for 90 seconds post injection.  After no sensory deficits were reported, and normal lower extremity motor function was noted,   the above injectate was administered so that equal amounts of the injectate were placed at each foramen (level) into the transforaminal epidural space.   Additional Comments:  The patient tolerated the procedure well Dressing: 2 x 2 sterile gauze and Band-Aid    Post-procedure details: Patient was observed during the procedure. Post-procedure instructions were reviewed.  Patient left the clinic in stable condition.

## 2018-07-30 ENCOUNTER — Ambulatory Visit (INDEPENDENT_AMBULATORY_CARE_PROVIDER_SITE_OTHER): Payer: Medicare HMO | Admitting: Physical Medicine and Rehabilitation

## 2018-07-30 ENCOUNTER — Other Ambulatory Visit: Payer: Self-pay

## 2018-07-30 ENCOUNTER — Encounter: Payer: Self-pay | Admitting: Physical Medicine and Rehabilitation

## 2018-07-30 DIAGNOSIS — R202 Paresthesia of skin: Secondary | ICD-10-CM | POA: Diagnosis not present

## 2018-07-30 NOTE — Progress Notes (Signed)
 .  Numeric Pain Rating Scale and Functional Assessment Average Pain 0   In the last MONTH (on 0-10 scale) has pain interfered with the following?  1. General activity like being  able to carry out your everyday physical activities such as walking, climbing stairs, carrying groceries, or moving a chair?  Rating(7)   +Driver, -BT, -Dye Allergies.

## 2018-08-02 NOTE — Procedures (Signed)
EMG & NCV Findings: Evaluation of the right ulnar motor nerve showed decreased conduction velocity (B Elbow-Wrist, 52 m/s).  The left median (across palm) sensory nerve showed prolonged distal peak latency (Wrist, 3.9 ms) and prolonged distal peak latency (Palm, 2.1 ms).  The right median (across palm) sensory nerve showed no response (Palm) and prolonged distal peak latency (3.8 ms).  The right ulnar sensory nerve showed reduced amplitude (8.6 V).  All remaining nerves (as indicated in the following tables) were within normal limits.  All left vs. right side differences were within normal limits.    All examined muscles (as indicated in the following table) showed no evidence of electrical instability.    Impression: The above electrodiagnostic study is ABNORMAL and reveals evidence of a mild bilateral median nerve entrapment at the wrist (carpal tunnel syndrome) affecting sensory and motor components.   There is no significant electrodiagnostic evidence of any other focal nerve entrapment, brachial plexopathy or cervical radiculopathy.   Recommendations: 1.  Follow-up with referring physician. 2.  Continue current management of symptoms. 3.  Continue use of resting splint at night-time and as needed during the day.  ___________________________ Wonda Olds Board Certified, American Board of Physical Medicine and Rehabilitation    Nerve Conduction Studies Anti Sensory Summary Table   Stim Site NR Peak (ms) Norm Peak (ms) P-T Amp (V) Norm P-T Amp Site1 Site2 Delta-P (ms) Dist (cm) Vel (m/s) Norm Vel (m/s)  Left Median Acr Palm Anti Sensory (2nd Digit)  32.3C  Wrist    *3.9 <3.6 22.1 >10 Wrist Palm 1.8 0.0    Palm    *2.1 <2.0 0.3         Right Median Acr Palm Anti Sensory (2nd Digit)  32.3C  Wrist    *3.8 <3.6 19.3 >10 Wrist Palm  0.0    Palm *NR  <2.0          Right Radial Anti Sensory (Base 1st Digit)  31.7C  Wrist    2.0 <3.1 14.7  Wrist Base 1st Digit 2.0 0.0    Right  Ulnar Anti Sensory (5th Digit)  32.4C  Wrist    3.6 <3.7 *8.6 >15.0 Wrist 5th Digit 3.6 14.0 39 >38   Motor Summary Table   Stim Site NR Onset (ms) Norm Onset (ms) O-P Amp (mV) Norm O-P Amp Site1 Site2 Delta-0 (ms) Dist (cm) Vel (m/s) Norm Vel (m/s)  Left Median Motor (Abd Poll Brev)  32.5C  Wrist    4.0 <4.2 8.4 >5 Elbow Wrist 4.4 24.5 56 >50  Elbow    8.4  7.7         Right Median Motor (Abd Poll Brev)  31.9C  Wrist    3.8 <4.2 8.2 >5 Elbow Wrist 4.4 22.5 51 >50  Elbow    8.2  7.3         Right Ulnar Motor (Abd Dig Min)  32.1C  Wrist    2.7 <4.2 5.6 >3 B Elbow Wrist 4.4 23.0 *52 >53  B Elbow    7.1  5.1  A Elbow B Elbow 1.9 10.0 53 >53  A Elbow    9.0  9.0          EMG   Side Muscle Nerve Root Ins Act Fibs Psw Amp Dur Poly Recrt Int Fraser Din Comment  Right Abd Poll Brev Median C8-T1 Nml Nml Nml Nml Nml 0 Nml Nml   Right 1stDorInt Ulnar C8-T1 Nml Nml Nml Nml Nml 0 Nml Nml  Right PronatorTeres Median C6-7 Nml Nml Nml Nml Nml 0 Nml Nml   Right Biceps Musculocut C5-6 Nml Nml Nml Nml Nml 0 Nml Nml   Right Deltoid Axillary C5-6 Nml Nml Nml Nml Nml 0 Nml Nml     Nerve Conduction Studies Anti Sensory Left/Right Comparison   Stim Site L Lat (ms) R Lat (ms) L-R Lat (ms) L Amp (V) R Amp (V) L-R Amp (%) Site1 Site2 L Vel (m/s) R Vel (m/s) L-R Vel (m/s)  Median Acr Palm Anti Sensory (2nd Digit)  32.3C  Wrist *3.9 *3.8 0.1 22.1 19.3 12.7 Wrist Palm     Palm *2.1   0.3         Radial Anti Sensory (Base 1st Digit)  31.7C  Wrist  2.0   14.7  Wrist Base 1st Digit     Ulnar Anti Sensory (5th Digit)  32.4C  Wrist  3.6   *8.6  Wrist 5th Digit  39    Motor Left/Right Comparison   Stim Site L Lat (ms) R Lat (ms) L-R Lat (ms) L Amp (mV) R Amp (mV) L-R Amp (%) Site1 Site2 L Vel (m/s) R Vel (m/s) L-R Vel (m/s)  Median Motor (Abd Poll Brev)  32.5C  Wrist 4.0 3.8 0.2 8.4 8.2 2.4 Elbow Wrist 56 51 5  Elbow 8.4 8.2 0.2 7.7 7.3 5.2       Ulnar Motor (Abd Dig Min)  32.1C  Wrist  2.7   5.6  B  Elbow Wrist  *52   B Elbow  7.1   5.1  A Elbow B Elbow  53   A Elbow  9.0   9.0           Waveforms:

## 2018-08-02 NOTE — Progress Notes (Signed)
Gregory [name] - 58 y.o. male MRN [id number]  Date of birth: 13-Oct-1960  Office Visit Note: Visit Date: 07/30/2018 PCP: Sherene Sires, DO Referred by: Sherene Sires, DO  Subjective: Chief Complaint  Patient presents with  . Left Hand - Numbness, Tingling  . Right Hand - Numbness, Tingling   HPI:  Gregory [name] is a 58 y.o. male who comes in today At the request of Dr. [name] for electrodiagnostic study of both upper limbs.  Patient is left-hand dominant and reports pain numbness and tingling in the right thumb index and middle finger and the left thumb and index and middle finger with left more than right symptoms.  He reports that this started about 6 months ago and has worsened.  He reports worsening with using his hands and he reports nocturnal complaints.  He has a history of prior ACDF by Dr. Ashok Pall in 2005.  It appears from the notes that in 2019 and March he had removal of anterior plate hardware and at the same time underwent bilateral carpal tunnel release and left ulnar nerve transposition.  It appears sometime after that at least on the notes that he had a right ulnar nerve transposition.  If I am seeing this right he has had cervical fusion status post hardware removal and surgeries on both ulnar nerves and carpal tunnel.  I do not have any other electrodiagnostic studies to review and these may in fact have been done by Dr. Brien Few who works with Dr. Christella Noa.  Reading through the H&P prior to the March 2019 surgery Dr. Lattie Corns indicates that he had severe ulnar neuropathy and severe carpal tunnel entrapment on an EMG.  ROS Otherwise per HPI.  Assessment & Plan: Visit Diagnoses:  1. Paresthesia of skin     Plan: Impression: The above electrodiagnostic study is ABNORMAL and reveals evidence of a mild bilateral median nerve entrapment at the wrist (carpal tunnel syndrome) affecting sensory and motor components.   There is no significant electrodiagnostic evidence of any  other focal nerve entrapment, brachial plexopathy or cervical radiculopathy.   Recommendations: 1.  Follow-up with referring physician. 2.  Continue current management of symptoms. 3.  Continue use of resting splint at night-time and as needed during the day.  Meds & Orders: No orders of the defined types were placed in this encounter.   Orders Placed This Encounter  Procedures  . NCV with EMG (electromyography)    Follow-up: Return for [name], M.D..   Procedures: No procedures performed  EMG & NCV Findings: Evaluation of the right ulnar motor nerve showed decreased conduction velocity (B Elbow-Wrist, 52 m/s).  The left median (across palm) sensory nerve showed prolonged distal peak latency (Wrist, 3.9 ms) and prolonged distal peak latency (Palm, 2.1 ms).  The right median (across palm) sensory nerve showed no response (Palm) and prolonged distal peak latency (3.8 ms).  The right ulnar sensory nerve showed reduced amplitude (8.6 V).  All remaining nerves (as indicated in the following tables) were within normal limits.  All left vs. right side differences were within normal limits.    All examined muscles (as indicated in the following table) showed no evidence of electrical instability.    Impression: The above electrodiagnostic study is ABNORMAL and reveals evidence of a mild bilateral median nerve entrapment at the wrist (carpal tunnel syndrome) affecting sensory and motor components.   There is no significant electrodiagnostic evidence of any other focal nerve entrapment, brachial plexopathy or cervical radiculopathy.  Recommendations: 1.  Follow-up with referring physician. 2.  Continue current management of symptoms. 3.  Continue use of resting splint at night-time and as needed during the day.  ___________________________ Wonda Olds Board Certified, American Board of Physical Medicine and Rehabilitation    Nerve Conduction Studies Anti Sensory Summary  Table   Stim Site NR Peak (ms) Norm Peak (ms) P-T Amp (V) Norm P-T Amp Site1 Site2 Delta-P (ms) Dist (cm) Vel (m/s) Norm Vel (m/s)  Left Median Acr Palm Anti Sensory (2nd Digit)  32.3C  Wrist    *3.9 <3.6 22.1 >10 Wrist Palm 1.8 0.0    Palm    *2.1 <2.0 0.3         Right Median Acr Palm Anti Sensory (2nd Digit)  32.3C  Wrist    *3.8 <3.6 19.3 >10 Wrist Palm  0.0    Palm *NR  <2.0          Right Radial Anti Sensory (Base 1st Digit)  31.7C  Wrist    2.0 <3.1 14.7  Wrist Base 1st Digit 2.0 0.0    Right Ulnar Anti Sensory (5th Digit)  32.4C  Wrist    3.6 <3.7 *8.6 >15.0 Wrist 5th Digit 3.6 14.0 39 >38   Motor Summary Table   Stim Site NR Onset (ms) Norm Onset (ms) O-P Amp (mV) Norm O-P Amp Site1 Site2 Delta-0 (ms) Dist (cm) Vel (m/s) Norm Vel (m/s)  Left Median Motor (Abd Poll Brev)  32.5C  Wrist    4.0 <4.2 8.4 >5 Elbow Wrist 4.4 24.5 56 >50  Elbow    8.4  7.7         Right Median Motor (Abd Poll Brev)  31.9C  Wrist    3.8 <4.2 8.2 >5 Elbow Wrist 4.4 22.5 51 >50  Elbow    8.2  7.3         Right Ulnar Motor (Abd Dig Min)  32.1C  Wrist    2.7 <4.2 5.6 >3 B Elbow Wrist 4.4 23.0 *52 >53  B Elbow    7.1  5.1  A Elbow B Elbow 1.9 10.0 53 >53  A Elbow    9.0  9.0          EMG   Side Muscle Nerve Root Ins Act Fibs Psw Amp Dur Poly Recrt Int Fraser Din Comment  Right Abd Poll Brev Median C8-T1 Nml Nml Nml Nml Nml 0 Nml Nml   Right 1stDorInt Ulnar C8-T1 Nml Nml Nml Nml Nml 0 Nml Nml   Right PronatorTeres Median C6-7 Nml Nml Nml Nml Nml 0 Nml Nml   Right Biceps Musculocut C5-6 Nml Nml Nml Nml Nml 0 Nml Nml   Right Deltoid Axillary C5-6 Nml Nml Nml Nml Nml 0 Nml Nml     Nerve Conduction Studies Anti Sensory Left/Right Comparison   Stim Site L Lat (ms) R Lat (ms) L-R Lat (ms) L Amp (V) R Amp (V) L-R Amp (%) Site1 Site2 L Vel (m/s) R Vel (m/s) L-R Vel (m/s)  Median Acr Palm Anti Sensory (2nd Digit)  32.3C  Wrist *3.9 *3.8 0.1 22.1 19.3 12.7 Wrist Palm     Palm *2.1   0.3          Radial Anti Sensory (Base 1st Digit)  31.7C  Wrist  2.0   14.7  Wrist Base 1st Digit     Ulnar Anti Sensory (5th Digit)  32.4C  Wrist  3.6   *8.6  Wrist 5th Digit  39  Motor Left/Right Comparison   Stim Site L Lat (ms) R Lat (ms) L-R Lat (ms) L Amp (mV) R Amp (mV) L-R Amp (%) Site1 Site2 L Vel (m/s) R Vel (m/s) L-R Vel (m/s)  Median Motor (Abd Poll Brev)  32.5C  Wrist 4.0 3.8 0.2 8.4 8.2 2.4 Elbow Wrist 56 51 5  Elbow 8.4 8.2 0.2 7.7 7.3 5.2       Ulnar Motor (Abd Dig Min)  32.1C  Wrist  2.7   5.6  B Elbow Wrist  *52   B Elbow  7.1   5.1  A Elbow B Elbow  53   A Elbow  9.0   9.0           Waveforms:                Clinical History: MRI LUMBAR SPINE WITHOUT CONTRAST  TECHNIQUE: Multiplanar, multisequence MR imaging of the lumbar spine was performed. No intravenous contrast was administered.  COMPARISON:  MRI lumbar spine 11/16/2016.  FINDINGS: Segmentation:  Standard.  Alignment:  Maintained.  Vertebrae:  No fracture or worrisome lesion.  Conus medullaris and cauda equina: Conus extends to the T12-L1 level. Conus and cauda equina appear normal.  Paraspinal and other soft tissues: Negative.  Disc levels:  T10-11 is imaged in the sagittal plane only and negative.  T11-12: Negative.  T12-L1: Negative.  L1-2: Negative.  L2-3: Epidural fat is prominent.  Minimal disc bulge.  No stenosis.  L3-4: Shallow disc bulge, shallow disc bulge, mild ligamentum flavum thickening and somewhat prominent epidural fat. The central canal is patent. Moderately severe bilateral foraminal narrowing is present. The appearance is unchanged.  L4-5: Shallow disc bulge and moderate facet degenerative disease. The central canal is open. Mild bilateral foraminal narrowing is seen. The appearance is unchanged.  L5-S1: Shallow right paracentral protrusion contacts the right S1 root without compression or displacement. Epidural fat is prominent. Foramina are  open.  IMPRESSION: No change in the appearance of the lumbar spine.  Moderately severe bilateral foraminal narrowing at L3-4. The central canal is open at this level.  Mild bilateral foraminal narrowing at L4-5. No central canal stenosis.  Right paracentral protrusion at L5-S1 contacts the descending right S1 root without compression or displacement.  Epidural lipomatosis.   Electronically Signed   By: Inge Rise M.D.   On: 03/17/2018 15:47     Objective:  VS:  HT:    WT:   BMI:     BP:   HR: bpm  TEMP: ( )  RESP:  Physical Exam Musculoskeletal:        General: No tenderness.     Comments: Inspection reveals no atrophy of the bilateral APB or FDI or hand intrinsics. There is no swelling, color changes, allodynia or dystrophic changes. There is 5 out of 5 strength in the bilateral wrist extension, finger abduction and long finger flexion. There is intact sensation to light touch in all dermatomal and peripheral nerve distributions.  There is a negative Hoffmann's test bilaterally.  Skin:    General: Skin is warm and dry.     Findings: No erythema or rash.  Neurological:     General: No focal deficit present.     Mental Status: He is alert and oriented to person, place, and time.     Sensory: No sensory deficit.     Motor: No weakness or abnormal muscle tone.     Coordination: Coordination normal.     Gait: Gait normal.  Psychiatric:  Mood and Affect: Mood normal.        Behavior: Behavior normal.        Thought Content: Thought content normal.     Ortho Exam Imaging: No results found.

## 2018-08-02 NOTE — Pre-Procedure Instructions (Signed)
Gregory [name]  08/02/2018      [id number] [address], [place] - [place] [address] Phone: [phone] Fax: [phone]  [id number] - [place], [place] [address] [place] [address] Phone: [phone] Fax: [phone]    Your procedure is scheduled on Tues., August 10, 2018 from 7:30AM-10:22AM  Report to [facility] Entrance "A" at 5:30AM  Call this number if you have problems the morning of surgery:  [phone]   Remember:  Do not eat or drink after midnight on June 22nd    Take these medicines the morning of surgery with A SIP OF WATER: If needed: Cetirizine (ZYRTEC), EPINEPHrine, Fluticasone (FLONASE), TiZANidine (ZANAFLEX),  Topiramate (TOPAMAX), and Albuterol Inhaler-bring with you the day of surgery.  As of today, stop taking all Other Aspirin Products, Vitamins, Fish oils, and Herbal medications. Also stop all NSAIDS i.e. Advil, Ibuprofen, Motrin, Aleve, Anaprox, Naproxen, BC, Goody Powders, and all Supplements.    Special instructions:  - Preparing For Surgery  Before surgery, you can play an important role. Because skin is not sterile, your skin needs to be as free of germs as possible. You can reduce the number of germs on your skin by washing with CHG (chlorahexidine gluconate) Soap before surgery.  CHG is an antiseptic cleaner which kills germs and bonds with the skin to continue killing germs even after washing.    Please do not use if you have an allergy to CHG or antibacterial soaps. If your skin becomes reddened/irritated stop using the CHG.  Do not shave (including legs and underarms) for at least 48 hours prior to first CHG shower. It is OK to shave your face.  Please follow these instructions carefully.   1. Shower the NIGHT BEFORE SURGERY and the MORNING OF SURGERY with CHG.   2. If you chose to  wash your hair, wash your hair first as usual with your normal shampoo.  3. After you shampoo, rinse your hair and body thoroughly to remove the shampoo.  4. Use CHG as you would any other liquid soap. You can apply CHG directly to the skin and wash gently with a scrungie or a clean washcloth.   5. Apply the CHG Soap to your body ONLY FROM THE NECK DOWN.  Do not use on open wounds or open sores. Avoid contact with your eyes, ears, mouth and genitals (private parts). Wash Face and genitals (private parts)  with your normal soap.  6. Wash thoroughly, paying special attention to the area where your surgery will be performed.  7. Thoroughly rinse your body with warm water from the neck down.  8. DO NOT shower/wash with your normal soap after using and rinsing off the CHG Soap.  9. Pat yourself dry with a CLEAN TOWEL.  10. Wear CLEAN PAJAMAS to bed the night before surgery, wear comfortable clothes the morning of surgery  11. Place CLEAN SHEETS on your bed the night of your first shower and DO NOT SLEEP WITH PETS.   Day of Surgery:  Oral Hygiene is also important to reduce your risk of infection.  Remember - BRUSH YOUR TEETH THE MORNING OF SURGERY WITH YOUR REGULAR TOOTHPASTE   Do not wear jewelry.  Do not wear lotions, powders, colognes, or deodorant.  Do not shave 48 hours prior to surgery.  Men may shave face.  Do not bring valuables  to the hospital.  [facility] is not responsible for any belongings or valuables.             Please wear clean clothes to the hospital/surgery center.    Contacts, dentures or bridgework may not be worn into surgery.    For patients admitted to the hospital, discharge time will be determined by your treatment team.  Patients discharged the day of surgery will not be allowed to drive home.    Please read over the following fact sheets that you were given. Pain Booklet, Coughing and Deep Breathing, MRSA Information and Surgical Site Infection  Prevention

## 2018-08-03 ENCOUNTER — Inpatient Hospital Stay (HOSPITAL_COMMUNITY)
Admission: RE | Admit: 2018-08-03 | Discharge: 2018-08-03 | Disposition: A | Discharge status: Home / Self Care | Payer: Medicare HMO | Source: Ambulatory Visit

## 2018-08-03 ENCOUNTER — Telehealth: Payer: Self-pay

## 2018-08-03 NOTE — Telephone Encounter (Signed)
See denial for surgery.  Please advise how you want to proceed.

## 2018-08-03 NOTE — Telephone Encounter (Signed)
Received vm from ins co stating medical director has denied the case due to pt having no PT and imaging not showing nerve root compression. Peer to peer is an option but would have to be set up and completed by 5:00 on 08/04/18. # to call is [phone] option 2. ERX#[id number]

## 2018-08-04 ENCOUNTER — Ambulatory Visit: Payer: Medicare HMO | Admitting: Surgery

## 2018-08-06 ENCOUNTER — Other Ambulatory Visit (HOSPITAL_COMMUNITY): Payer: Medicare HMO

## 2018-08-09 ENCOUNTER — Other Ambulatory Visit: Payer: Self-pay | Admitting: Family Medicine

## 2018-08-09 DIAGNOSIS — G4700 Insomnia, unspecified: Secondary | ICD-10-CM

## 2018-08-10 ENCOUNTER — Encounter (HOSPITAL_COMMUNITY): Admission: RE | Payer: Self-pay | Source: Home / Self Care

## 2018-08-10 ENCOUNTER — Ambulatory Visit (HOSPITAL_COMMUNITY): Admission: RE | Admit: 2018-08-10 | Payer: Medicare HMO | Source: Home / Self Care | Admitting: Specialist

## 2018-08-10 SURGERY — LUMBAR LAMINECTOMY/DECOMPRESSION MICRODISCECTOMY
Anesthesia: General

## 2018-08-11 ENCOUNTER — Telehealth: Payer: Self-pay | Admitting: Specialist

## 2018-08-11 NOTE — Telephone Encounter (Signed)
Patient's wife Gregory [name] called advised the insurance company denied surgery for patient and asked if Dr Louanne Skye can send an appeal to the insurance company to get them to approve surgery for the patient. Gregory [name] said he need to have the surgery on his back. Gregory [name] advised patient has numbness in his leg and foot. She said patient is also loosing his balance.

## 2018-08-11 NOTE — Telephone Encounter (Signed)
Patient's wife Gregory [name] called advised the insurance company denied surgery for patient and asked if Dr Louanne Skye can send an appeal to the insurance company to get them to approve surgery for the patient. Gregory [name] said he need to have the surgery on his back. Gregory [name] advised patient has numbness in his leg and foot. She said patient is also loosing his balance. The number to contact Gregory [name] is  [phone]

## 2018-08-12 ENCOUNTER — Ambulatory Visit: Payer: Medicare HMO | Admitting: Surgery

## 2018-09-09 ENCOUNTER — Ambulatory Visit (INDEPENDENT_AMBULATORY_CARE_PROVIDER_SITE_OTHER): Payer: Medicare HMO | Admitting: Specialist

## 2018-09-09 ENCOUNTER — Encounter: Payer: Self-pay | Admitting: Specialist

## 2018-09-09 ENCOUNTER — Ambulatory Visit (INDEPENDENT_AMBULATORY_CARE_PROVIDER_SITE_OTHER): Payer: Medicare HMO

## 2018-09-09 VITALS — BP 142/91 | HR 72 | Ht 67.0 in | Wt 204.0 lb

## 2018-09-09 DIAGNOSIS — R296 Repeated falls: Secondary | ICD-10-CM | POA: Diagnosis not present

## 2018-09-09 DIAGNOSIS — Z981 Arthrodesis status: Secondary | ICD-10-CM

## 2018-09-09 DIAGNOSIS — M542 Cervicalgia: Secondary | ICD-10-CM

## 2018-09-09 DIAGNOSIS — M48062 Spinal stenosis, lumbar region with neurogenic claudication: Secondary | ICD-10-CM | POA: Diagnosis not present

## 2018-09-09 MED ORDER — TRAMADOL HCL 50 MG PO TABS: 100.0000 mg | ORAL_TABLET | Freq: Four times a day (QID) | ORAL | 0 refills | Status: AC | PRN

## 2018-09-09 MED ORDER — BACLOFEN 10 MG PO TABS: 10.0000 mg | ORAL_TABLET | Freq: Two times a day (BID) | ORAL | 0 refills | Status: DC

## 2018-09-09 MED ORDER — GABAPENTIN 100 MG PO CAPS: 100.0000 mg | ORAL_CAPSULE | Freq: Two times a day (BID) | ORAL | 3 refills | Status: DC
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
## 2018-09-09 NOTE — Patient Instructions (Signed)
Avoid bending, stooping and avoid lifting weights greater than 10 lbs. Avoid prolong standing and walking. Avoid frequent bending and stooping  No lifting greater than 10 lbs. May use ice or moist heat for pain. Weight loss is of benefit. Handicap license is approved. MRI of the cervical spine

## 2018-09-16 ENCOUNTER — Other Ambulatory Visit: Payer: Self-pay | Admitting: Family Medicine

## 2018-09-30 ENCOUNTER — Telehealth: Payer: Self-pay | Admitting: Specialist

## 2018-09-30 NOTE — Telephone Encounter (Signed)
Please advise 

## 2018-09-30 NOTE — Telephone Encounter (Signed)
No order in chart for MRI and no mention of it in last office note. Will discuss with Dr. Louanne Skye to advise.

## 2018-09-30 NOTE — Telephone Encounter (Signed)
Patient's wife called this morning stating that her husband has not heard anything in regards to his MIR of his neck.  She stated that Dr. Louanne Skye wanted him to have this done so that he has something to compare to.  CB#[phone].  Thank you.

## 2018-10-01 ENCOUNTER — Other Ambulatory Visit: Payer: Self-pay | Admitting: Radiology

## 2018-10-01 DIAGNOSIS — M542 Cervicalgia: Secondary | ICD-10-CM

## 2018-10-04 ENCOUNTER — Other Ambulatory Visit: Payer: Self-pay | Admitting: Specialist

## 2018-10-04 ENCOUNTER — Ambulatory Visit: Payer: Medicare HMO | Admitting: Specialist

## 2018-10-04 NOTE — Telephone Encounter (Signed)
Please advise 

## 2018-10-05 ENCOUNTER — Other Ambulatory Visit: Payer: Self-pay | Admitting: Radiology

## 2018-10-05 NOTE — Telephone Encounter (Signed)
Patient's appt was canceled yesterday due to him not having MRI yet, still awaiting scheduling. Spoke with his wife who states patient requests refill of tramadol until able to be seen after MRI.    Request sent to Dr. Louanne Skye.

## 2018-10-08 MED ORDER — TRAMADOL HCL 50 MG PO TABS: 50.0000 mg | ORAL_TABLET | Freq: Four times a day (QID) | ORAL | 0 refills | Status: DC | PRN

## 2018-10-13 ENCOUNTER — Ambulatory Visit
Admission: RE | Admit: 2018-10-13 | Discharge: 2018-10-13 | Disposition: A | Discharge status: Home / Self Care | Payer: Medicare HMO | Source: Ambulatory Visit | Attending: Specialist | Admitting: Specialist

## 2018-10-13 ENCOUNTER — Other Ambulatory Visit: Payer: Self-pay

## 2018-10-13 DIAGNOSIS — M542 Cervicalgia: Secondary | ICD-10-CM

## 2018-10-13 DIAGNOSIS — M4802 Spinal stenosis, cervical region: Secondary | ICD-10-CM | POA: Diagnosis not present

## 2018-10-20 ENCOUNTER — Encounter: Payer: Self-pay | Admitting: Specialist

## 2018-10-20 ENCOUNTER — Ambulatory Visit (INDEPENDENT_AMBULATORY_CARE_PROVIDER_SITE_OTHER): Payer: Medicare HMO | Admitting: Specialist

## 2018-10-20 ENCOUNTER — Other Ambulatory Visit: Payer: Self-pay

## 2018-10-20 ENCOUNTER — Other Ambulatory Visit: Payer: Self-pay | Admitting: Specialist

## 2018-10-20 VITALS — BP 157/101 | HR 59 | Ht 67.0 in | Wt 204.0 lb

## 2018-10-20 DIAGNOSIS — Z981 Arthrodesis status: Secondary | ICD-10-CM

## 2018-10-20 DIAGNOSIS — M5000 Cervical disc disorder with myelopathy, unspecified cervical region: Secondary | ICD-10-CM | POA: Diagnosis not present

## 2018-10-20 DIAGNOSIS — M50222 Other cervical disc displacement at C5-C6 level: Secondary | ICD-10-CM | POA: Diagnosis not present

## 2018-10-20 MED ORDER — TRAMADOL HCL 50 MG PO TABS: 50.0000 mg | ORAL_TABLET | Freq: Four times a day (QID) | ORAL | 0 refills | Status: DC | PRN

## 2018-10-20 MED ORDER — METHYLPREDNISOLONE 4 MG PO TBPK: ORAL_TABLET | ORAL | 0 refills | Status: DC
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
## 2018-10-21 ENCOUNTER — Ambulatory Visit (INDEPENDENT_AMBULATORY_CARE_PROVIDER_SITE_OTHER): Payer: Medicare HMO | Admitting: Family Medicine

## 2018-10-21 ENCOUNTER — Other Ambulatory Visit: Payer: Self-pay

## 2018-10-21 DIAGNOSIS — I10 Essential (primary) hypertension: Secondary | ICD-10-CM | POA: Diagnosis not present

## 2018-10-21 MED ORDER — AMLODIPINE BESYLATE 5 MG PO TABS: 5.0000 mg | ORAL_TABLET | Freq: Every day | ORAL | 3 refills | Status: DC

## 2018-10-21 NOTE — Patient Instructions (Signed)
I have started you on 5mg  of amlodipine. Please take it at night along with his other blood pressure medication.  Please make an appointment for 4 to 6 weeks from now.    Have a great day,   Clemetine Marker, MD  Amlodipine tablets What is this medicine? AMLODIPINE (am LOE di peen) is a calcium-channel blocker. It affects the amount of calcium found in your heart and muscle cells. This relaxes your blood vessels, which can reduce the amount of work the heart has to do. This medicine is used to lower high blood pressure. It is also used to prevent chest pain. This medicine may be used for other purposes; ask your health care provider or pharmacist if you have questions. COMMON BRAND NAME(S): Norvasc What should I tell my health care provider before I take this medicine? They need to know if you have any of these conditions:  heart disease  liver disease  an unusual or allergic reaction to amlodipine, other medicines, foods, dyes, or preservatives  pregnant or trying to get pregnant  breast-feeding How should I use this medicine? Take this medicine by mouth with a glass of water. Follow the directions on the prescription label. You can take it with or without food. If it upsets your stomach, take it with food. Take your medicine at regular intervals. Do not take it more often than directed. Do not stop taking except on your doctor's advice. Talk to your pediatrician regarding the use of this medicine in children. While this drug may be prescribed for children as young as 6 years for selected conditions, precautions do apply. Patients over 16 years of age may have a stronger reaction and need a smaller dose. Overdosage: If you think you have taken too much of this medicine contact a poison control center or emergency room at once. NOTE: This medicine is only for you. Do not share this medicine with others. What if I miss a dose? If you miss a dose, take it as soon as you can. If it is almost time  for your next dose, take only that dose. Do not take double or extra doses. What may interact with this medicine? Do not take this medicine with any of the following medications:  tranylcypromine This medicine may also interact with the following medications:  clarithromycin  cyclosporine  diltiazem  itraconazole  simvastatin  tacrolimus This list may not describe all possible interactions. Give your health care provider a list of all the medicines, herbs, non-prescription drugs, or dietary supplements you use. Also tell them if you smoke, drink alcohol, or use illegal drugs. Some items may interact with your medicine. What should I watch for while using this medicine? Visit your healthcare professional for regular checks on your progress. Check your blood pressure as directed. Ask your healthcare professional what your blood pressure should be and when you should contact him or her. Do not treat yourself for coughs, colds, or pain while you are using this medicine without asking your healthcare professional for advice. Some medicines may increase your blood pressure. You may get dizzy. Do not drive, use machinery, or do anything that needs mental alertness until you know how this medicine affects you. Do not stand or sit up quickly, especially if you are an older patient. This reduces the risk of dizzy or fainting spells. Avoid alcoholic drinks; they can make you dizzier. What side effects may I notice from receiving this medicine? Side effects that you should report to your doctor  or health care professional as soon as possible:  allergic reactions like skin rash, itching or hives; swelling of the face, lips, or tongue  fast, irregular heartbeat  signs and symptoms of low blood pressure like dizziness; feeling faint or lightheaded, falls; unusually weak or tired  swelling of ankles, feet, hands Side effects that usually do not require medical attention (report these to your doctor  or health care professional if they continue or are bothersome):  dry mouth  facial flushing  headache  stomach pain  tiredness This list may not describe all possible side effects. Call your doctor for medical advice about side effects. You may report side effects to FDA at 1-800-FDA-1088. Where should I keep my medicine? Keep out of the reach of children. Store at room temperature between 59 and 86 degrees F (15 and 30 degrees C). Throw away any unused medicine after the expiration date. NOTE: This sheet is a summary. It may not cover all possible information. If you have questions about this medicine, talk to your doctor, pharmacist, or health care provider.  2020 Elsevier/Gold Standard (2017-08-28 15:07:10)

## 2018-10-21 NOTE — Progress Notes (Signed)
   [facility] Phone: [phone]     Gregory [name] - 58 y.o. male MRN [id number]  Date of birth: 1960-11-08  Subjective:   cc: HTN  HPI:  HTN: recently had an appointment with the surgeon where his BP was elevated.  They are waiting on the surgery for his neck which could be next week. The surgeon told him to see his PCP about the high BP.  She states the surgeon told her they had previously had to cancel a recent surgery on another pt bc of high BP.   His wife says she has had several diastolic readings above 90 when she measures at home. Takes his losartan at night except for last two days when wife tried giving it to him in the AM to see if it improved his bp.   ROS: See HPI for pertinent positives and negatives  Past Medical History  Family history reviewed for today's visit. No changes.  Health Maintenance:  -  Health Maintenance Due  Topic Date Due  . INFLUENZA VACCINE  09/18/2018    -  reports that he quit smoking about 6 years ago. His smoking use included cigarettes and cigars. He has a 37.50 pack-year smoking history. He has never used smokeless tobacco.  Objective:   BP 118/78   Pulse 75   Wt 187 lb (84.8 kg)   SpO2 98%   BMI 29.29 kg/m  Gen: NAD, alert and oriented, cooperative with exam CV: normal rate, regular rhythm. No murmurs, no rubs.  Resp: LCTAB, no wheezes, crackles. normal work of breathing Psych: Appropriate behavior  Assessment/Plan:   Essential hypertension On Losartan 50mg  daily.  Previous readings in epic from past 6 months show multiple readings with either elevated systolic or diastolic pressures. Pt was normal today though.  According to wife, surgeons are okay with adding a bp medication before possible surgery next week.  - start amlodipine 5mg  as this is most well tolerated and wont need additional labwork before possible surgery.   - followup in 4 to 6 weeks with Dr. Criss Rosales   No orders of the defined types were  placed in this encounter.   Meds ordered this encounter  Medications  . amLODipine (NORVASC) 5 MG tablet    Sig: Take 1 tablet (5 mg total) by mouth at bedtime.    Dispense:  90 tablet    Refill:  3     Clemetine Marker, MD PGY-2 [facility]

## 2018-10-21 NOTE — Assessment & Plan Note (Signed)
On Losartan 50mg  daily.  Previous readings in epic from past 6 months show multiple readings with either elevated systolic or diastolic pressures. Pt was normal today though.  According to wife, surgeons are okay with adding a bp medication before possible surgery next week.  - start amlodipine 5mg  as this is most well tolerated and wont need additional labwork before possible surgery.   - followup in 4 to 6 weeks with Dr. Criss Rosales

## 2018-10-26 ENCOUNTER — Encounter: Payer: Self-pay | Admitting: Specialist

## 2018-10-28 NOTE — Progress Notes (Signed)
Regarding pre-surgical evaluation.  Patient was seen for HTN (which has been minimally elevated recently) and started on low dose amlodipine, his BP was normal at his visit with Dr. Jeannine Kitten last week.  He is not on anticoagulation and his calculated ACS surgical risk is below avg based off my chart review.  I don't see any indication to to require delay of surgery for this patient.  Dr. Criss Rosales, (pcp)

## 2018-11-01 ENCOUNTER — Other Ambulatory Visit: Payer: Self-pay

## 2018-11-03 ENCOUNTER — Ambulatory Visit (INDEPENDENT_AMBULATORY_CARE_PROVIDER_SITE_OTHER): Payer: Medicare HMO | Admitting: Surgery

## 2018-11-03 ENCOUNTER — Encounter: Payer: Self-pay | Admitting: Surgery

## 2018-11-03 ENCOUNTER — Encounter (HOSPITAL_COMMUNITY): Payer: Self-pay

## 2018-11-03 ENCOUNTER — Other Ambulatory Visit: Payer: Self-pay

## 2018-11-03 ENCOUNTER — Ambulatory Visit (HOSPITAL_COMMUNITY)
Admission: RE | Admit: 2018-11-03 | Discharge: 2018-11-03 | Disposition: A | Discharge status: Home / Self Care | Payer: Medicare HMO | Source: Ambulatory Visit | Attending: Surgery | Admitting: Surgery

## 2018-11-03 ENCOUNTER — Encounter (HOSPITAL_COMMUNITY)
Admission: RE | Admit: 2018-11-03 | Discharge: 2018-11-03 | Disposition: A | Discharge status: Home / Self Care | Payer: Medicare HMO | Source: Ambulatory Visit | Attending: Specialist | Admitting: Specialist

## 2018-11-03 VITALS — BP 139/93 | HR 63 | Ht 67.0 in | Wt 204.0 lb

## 2018-11-03 DIAGNOSIS — Z79899 Other long term (current) drug therapy: Secondary | ICD-10-CM | POA: Diagnosis not present

## 2018-11-03 DIAGNOSIS — F17210 Nicotine dependence, cigarettes, uncomplicated: Secondary | ICD-10-CM | POA: Diagnosis not present

## 2018-11-03 DIAGNOSIS — I10 Essential (primary) hypertension: Secondary | ICD-10-CM | POA: Insufficient documentation

## 2018-11-03 DIAGNOSIS — Z01818 Encounter for other preprocedural examination: Secondary | ICD-10-CM

## 2018-11-03 DIAGNOSIS — K219 Gastro-esophageal reflux disease without esophagitis: Secondary | ICD-10-CM | POA: Diagnosis not present

## 2018-11-03 DIAGNOSIS — M5000 Cervical disc disorder with myelopathy, unspecified cervical region: Secondary | ICD-10-CM

## 2018-11-03 DIAGNOSIS — R69 Illness, unspecified: Secondary | ICD-10-CM | POA: Diagnosis not present

## 2018-11-03 DIAGNOSIS — M4802 Spinal stenosis, cervical region: Secondary | ICD-10-CM | POA: Insufficient documentation

## 2018-11-03 DIAGNOSIS — M50122 Cervical disc disorder at C5-C6 level with radiculopathy: Secondary | ICD-10-CM | POA: Diagnosis present

## 2018-11-03 DIAGNOSIS — G4730 Sleep apnea, unspecified: Secondary | ICD-10-CM | POA: Diagnosis not present

## 2018-11-03 LAB — COMPREHENSIVE METABOLIC PANEL
ALT: 33 U/L (ref 0–44)
AST: 27 U/L (ref 15–41)
Albumin: 3.9 g/dL (ref 3.5–5.0)
Alkaline Phosphatase: 79 U/L (ref 38–126)
Anion gap: 8 (ref 5–15)
BUN: 18 mg/dL (ref 6–20)
CO2: 23 mmol/L (ref 22–32)
Calcium: 8.9 mg/dL (ref 8.9–10.3)
Chloride: 104 mmol/L (ref 98–111)
Creatinine, Ser: 1.3 mg/dL — ABNORMAL HIGH (ref 0.61–1.24)
GFR calc Af Amer: >60 mL/min (ref >60)
GFR calc non Af Amer: 59 mL/min — ABNORMAL LOW (ref >60)
Glucose, Bld: 106 mg/dL — ABNORMAL HIGH (ref 70–99)
Potassium: 4 mmol/L (ref 3.5–5.1)
Sodium: 135 mmol/L (ref 135–145)
Total Bilirubin: 0.4 mg/dL (ref 0.3–1.2)
Total Protein: 7.2 g/dL (ref 6.5–8.1)

## 2018-11-03 LAB — PROTIME-INR
INR: 1.1 (ref 0.8–1.2)
Prothrombin Time: 14.2 seconds (ref 11.4–15.2)

## 2018-11-03 LAB — CBC
HCT: 41.1 % (ref 39.0–52.0)
Hemoglobin: 14.2 g/dL (ref 13.0–17.0)
MCH: 31.3 pg (ref 26.0–34.0)
MCHC: 34.5 g/dL (ref 30.0–36.0)
MCV: 90.7 fL (ref 80.0–100.0)
Platelets: 296 10*3/uL (ref 150–400)
RBC: 4.53 MIL/uL (ref 4.22–5.81)
RDW: 12.8 % (ref 11.5–15.5)
WBC: 10 10*3/uL (ref 4.0–10.5)
nRBC: 0 % (ref 0.0–0.2)

## 2018-11-03 LAB — NO BLOOD PRODUCTS

## 2018-11-03 LAB — SURGICAL PCR SCREEN
MRSA, PCR: NEGATIVE
Staphylococcus aureus: NEGATIVE

## 2018-11-03 LAB — URINALYSIS, ROUTINE W REFLEX MICROSCOPIC
Bilirubin Urine: NEGATIVE
Glucose, UA: NEGATIVE mg/dL
Hgb urine dipstick: NEGATIVE
Ketones, ur: NEGATIVE mg/dL
Leukocytes,Ua: NEGATIVE
Nitrite: NEGATIVE
Protein, ur: NEGATIVE mg/dL
Specific Gravity, Urine: 1.015 (ref 1.005–1.030)
pH: 6 (ref 5.0–8.0)

## 2018-11-03 LAB — APTT: aPTT: 25 seconds (ref 24–36)

## 2018-11-03 MED ORDER — TRAMADOL HCL 50 MG PO TABS: 50.0000 mg | ORAL_TABLET | Freq: Four times a day (QID) | ORAL | 0 refills | Status: DC | PRN

## 2018-11-03 NOTE — Progress Notes (Signed)
58 year old white male history of C5-6 HNP/stenosis, neck pain and upper extremity radiculopathy comes in for preop evaluation.  Symptoms unchanged from previous visit.  He is wanting to proceed with C5-6 ACDF as scheduled.  Today history and physical performed.  Surgical procedure along with potential rehab/recovery time discussed.  All questions answered.  On exam today patient has positive bilateral shoulder impingement testing.  Bilateral shoulder pain with supraspinatus resistance.  Negative drop arm test.  Sometime during his postop recovery for his neck I did advise patient and his wife was present that it may be beneficial to try bilateral shoulder subacromial Marcaine/Depo-Medrol injections.  We will see how he does after surgery.

## 2018-11-03 NOTE — Progress Notes (Signed)
[id number] [address], [place] [address] Phone: [phone] Fax: [phone]    Your procedure is scheduled on Tuesday, September 22nd.  Report to [facility] Main Entrance "A" at 5:30 A.M., and check in at the Admitting office.  Call this number if you have problems the morning of surgery:  [phone]  Call [phone] if you have any questions prior to your surgery date Monday-Friday 8am-4pm   Remember:  Do not eat after midnight the night before your surgery  You may drink clear liquids until 4:30 the morning of your surgery.   Clear liquids allowed are: Water, Non-Citrus Juices (without pulp), Carbonated Beverages, Clear Tea, Black Coffee Only, and Gatorade   Please complete your PRE-SURGERY ENSURE that was provided to you by 4:30 A.M. the morning of surgery.  Please, if able, drink it in one setting. DO NOT SIP.   Take these medicines the morning of surgery with A SIP OF WATER  cetirizine (ZYRTEC)   If needed - albuterol (VENTOLIN HFA) 108 (90 Base)/inhaler, EPINEPHrine/injection, fluticasone (FLONASE)/nasal spray, topiramate (TOPAMAX), traMADol (ULTRAM)   7 days prior to surgery STOP taking any Aspirin (unless otherwise instructed by your surgeon), Aleve, Naproxen, Ibuprofen, Motrin, Advil, Goody's, BC's, all herbal medications, fish oil, and all vitamins.   The Morning of Surgery  Do not wear jewelry, make-up or nail polish.  Do not wear lotions, powders, or perfumes/colognes, or deodorant  Do not shave 48 hours prior to surgery.  Men may shave face and neck.  Do not bring valuables to the hospital.  [facility] is not responsible for any belongings or valuables.  If you are a smoker, DO NOT Smoke 24 hours prior to surgery IF you wear a CPAP at night please bring your mask, tubing, and machine the morning of surgery   Remember that you must have someone to transport you home after your surgery, and  remain with you for 24 hours if you are discharged the same day.  Contacts, glasses, hearing aids, dentures or bridgework may not be worn into surgery.   Leave your suitcase in the car.  After surgery it may be brought to your room.  For patients admitted to the hospital, discharge time will be determined by your treatment team.  Patients discharged the day of surgery will not be allowed to drive home.   Special instructions:   [facility]- Preparing For Surgery  Before surgery, you can play an important role. Because skin is not sterile, your skin needs to be as free of germs as possible. You can reduce the number of germs on your skin by washing with CHG (chlorahexidine gluconate) Soap before surgery.  CHG is an antiseptic cleaner which kills germs and bonds with the skin to continue killing germs even after washing.    Oral Hygiene is also important to reduce your risk of infection.  Remember - BRUSH YOUR TEETH THE MORNING OF SURGERY WITH YOUR REGULAR TOOTHPASTE  Please do not use if you have an allergy to CHG or antibacterial soaps. If your skin becomes reddened/irritated stop using the CHG.  Do not shave (including legs and underarms) for at least 48 hours prior to first CHG shower. It is OK to shave your face.  Please follow these instructions carefully.   1. Shower the NIGHT BEFORE SURGERY and the MORNING OF SURGERY with CHG Soap.   2. If you chose to wash your hair, wash your hair first as usual  with your normal shampoo.  3. After you shampoo, rinse your hair and body thoroughly to remove the shampoo.  4. Use CHG as you would any other liquid soap. You can apply CHG directly to the skin and wash gently with a scrungie or a clean washcloth.   5. Apply the CHG Soap to your body ONLY FROM THE NECK DOWN.  Do not use on open wounds or open sores. Avoid contact with your eyes, ears, mouth and genitals (private parts). Wash Face and genitals (private parts)  with your normal soap.    6. Wash thoroughly, paying special attention to the area where your surgery will be performed.  7. Thoroughly rinse your body with warm water from the neck down.  8. DO NOT shower/wash with your normal soap after using and rinsing off the CHG Soap.  9. Pat yourself dry with a CLEAN TOWEL.  10. Wear CLEAN PAJAMAS to bed the night before surgery, wear comfortable clothes the morning of surgery  11. Place CLEAN SHEETS on your bed the night of your first shower and DO NOT SLEEP WITH PETS.  Day of Surgery: Do not apply any deodorants/lotions. Please shower the morning of surgery with the CHG soap  Please wear clean clothes to the hospital/surgery center.   Remember to brush your teeth WITH YOUR REGULAR TOOTHPASTE.  Please read over the following fact sheets that you were given.

## 2018-11-03 NOTE — Progress Notes (Addendum)
  Coronavirus Screening  Have you experienced the following symptoms:  Cough yes/no: No Fever (>100.27F)  yes/no: No Runny nose yes/no: No Sore throat yes/no: No Difficulty breathing/shortness of breath  yes/no: No Loss of smell or taste-No Have you or a family member traveled in the last 14 days and where? yes/no: No  PCP - Bland,Scott  Cardiologist - denies  Chest x-ray - today  EKG - today  Stress Test - 03-09-2009  ECHO - 03-08-2009  Cardiac Cath - denies  AICD-denies PM-denies LOOP-denies  Sleep Study - Y CPAP - No  LABS-PCR,CBC,CMP,APTT,UA  ASA-denies  ERAS-Pre-surgery Ensure with instructions  HA1C-denies Fasting Blood Sugar -  Checks Blood Sugar _____ times a day  Anesthesia-Y. Review EKG. (H/o HTN. No other significant cardiac history)  Pt denies having chest pain, sob, or fever at this time. All instructions explained to the pt, with a verbal understanding of the material. Pt agrees to go over the instructions while at home for a better understanding. Pt also instructed to self quarantine after being tested for COVID-19. The opportunity to ask questions was provided.

## 2018-11-04 ENCOUNTER — Other Ambulatory Visit: Payer: Self-pay | Admitting: Family Medicine

## 2018-11-04 ENCOUNTER — Encounter (HOSPITAL_COMMUNITY): Payer: Self-pay

## 2018-11-04 DIAGNOSIS — G4700 Insomnia, unspecified: Secondary | ICD-10-CM

## 2018-11-04 NOTE — Anesthesia Preprocedure Evaluation (Addendum)
Anesthesia Evaluation  Patient identified by MRN, date of birth, ID band Patient awake    Reviewed: Allergy & Precautions, NPO status , Patient's Chart, lab work & pertinent test results  History of Anesthesia Complications Negative for: history of anesthetic complications  Airway Mallampati: III  TM Distance: >3 FB Neck ROM: Full    Dental  (+) Poor Dentition, Missing, Dental Advisory Given   Pulmonary asthma , sleep apnea , Current Smoker and Patient abstained from smoking.,    Pulmonary exam normal        Cardiovascular hypertension, Pt. on medications Normal cardiovascular exam     Neuro/Psych PSYCHIATRIC DISORDERS Depression Bipolar Disorder    GI/Hepatic Neg liver ROS, GERD  ,  Endo/Other  negative endocrine ROS  Renal/GU negative Renal ROS     Musculoskeletal  (+) Arthritis ,   Abdominal   Peds  Hematology  (+) JEHOVAH'S WITNESS  Anesthesia Other Findings   Reproductive/Obstetrics                           Anesthesia Physical Anesthesia Plan  ASA: III  Anesthesia Plan: General   Post-op Pain Management:    Induction: Intravenous  PONV Risk Score and Plan: 3 and Ondansetron, Midazolam and Dexamethasone  Airway Management Planned: Oral ETT  Additional Equipment:   Intra-op Plan:   Post-operative Plan: Extubation in OR  Informed Consent: I have reviewed the patients History and Physical, chart, labs and discussed the procedure including the risks, benefits and alternatives for the proposed anesthesia with the patient or authorized representative who has indicated his/her understanding and acceptance.     Dental advisory given  Plan Discussed with: CRNA and Anesthesiologist  Anesthesia Plan Comments: (PAT note written 11/04/2018 by Myra Gianotti, PA-C. )      Anesthesia Quick Evaluation
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
## 2018-11-05 ENCOUNTER — Other Ambulatory Visit (HOSPITAL_COMMUNITY)
Admission: RE | Admit: 2018-11-05 | Discharge: 2018-11-05 | Disposition: A | Discharge status: Home / Self Care | Payer: Medicare HMO | Source: Ambulatory Visit | Attending: Specialist | Admitting: Specialist

## 2018-11-05 DIAGNOSIS — Z20828 Contact with and (suspected) exposure to other viral communicable diseases: Secondary | ICD-10-CM | POA: Diagnosis not present

## 2018-11-05 DIAGNOSIS — Z01812 Encounter for preprocedural laboratory examination: Secondary | ICD-10-CM | POA: Insufficient documentation

## 2018-11-06 LAB — NOVEL CORONAVIRUS, NAA (HOSP ORDER, SEND-OUT TO REF LAB; TAT 18-24 HRS): SARS-CoV-2, NAA: NOT DETECTED

## 2018-11-08 ENCOUNTER — Other Ambulatory Visit: Payer: Self-pay | Admitting: Family Medicine

## 2018-11-08 DIAGNOSIS — K219 Gastro-esophageal reflux disease without esophagitis: Secondary | ICD-10-CM

## 2018-11-09 ENCOUNTER — Other Ambulatory Visit: Payer: Self-pay

## 2018-11-09 ENCOUNTER — Ambulatory Visit (HOSPITAL_COMMUNITY): Payer: Medicare HMO | Admitting: Anesthesiology

## 2018-11-09 ENCOUNTER — Ambulatory Visit (HOSPITAL_COMMUNITY)
Admission: RE | Admit: 2018-11-09 | Discharge: 2018-11-10 | Disposition: A | Discharge status: Home / Self Care | Payer: Medicare HMO | Attending: Specialist | Admitting: Specialist

## 2018-11-09 ENCOUNTER — Ambulatory Visit (HOSPITAL_COMMUNITY): Payer: Medicare HMO | Admitting: Vascular Surgery

## 2018-11-09 ENCOUNTER — Encounter (HOSPITAL_COMMUNITY)
Admission: RE | Disposition: A | Discharge status: Home / Self Care | Payer: Self-pay | Source: Home / Self Care | Attending: Specialist

## 2018-11-09 ENCOUNTER — Ambulatory Visit (HOSPITAL_COMMUNITY): Payer: Medicare HMO

## 2018-11-09 DIAGNOSIS — M4322 Fusion of spine, cervical region: Secondary | ICD-10-CM | POA: Diagnosis present

## 2018-11-09 DIAGNOSIS — Z96652 Presence of left artificial knee joint: Secondary | ICD-10-CM | POA: Insufficient documentation

## 2018-11-09 DIAGNOSIS — K219 Gastro-esophageal reflux disease without esophagitis: Secondary | ICD-10-CM | POA: Insufficient documentation

## 2018-11-09 DIAGNOSIS — M50022 Cervical disc disorder at C5-C6 level with myelopathy: Secondary | ICD-10-CM | POA: Diagnosis not present

## 2018-11-09 DIAGNOSIS — R69 Illness, unspecified: Secondary | ICD-10-CM | POA: Diagnosis not present

## 2018-11-09 DIAGNOSIS — M5010 Cervical disc disorder with radiculopathy, unspecified cervical region: Secondary | ICD-10-CM | POA: Diagnosis present

## 2018-11-09 DIAGNOSIS — Z419 Encounter for procedure for purposes other than remedying health state, unspecified: Secondary | ICD-10-CM

## 2018-11-09 DIAGNOSIS — M50122 Cervical disc disorder at C5-C6 level with radiculopathy: Secondary | ICD-10-CM | POA: Insufficient documentation

## 2018-11-09 DIAGNOSIS — J45909 Unspecified asthma, uncomplicated: Secondary | ICD-10-CM | POA: Insufficient documentation

## 2018-11-09 DIAGNOSIS — I10 Essential (primary) hypertension: Secondary | ICD-10-CM | POA: Diagnosis not present

## 2018-11-09 DIAGNOSIS — Z79899 Other long term (current) drug therapy: Secondary | ICD-10-CM | POA: Insufficient documentation

## 2018-11-09 DIAGNOSIS — G4730 Sleep apnea, unspecified: Secondary | ICD-10-CM | POA: Insufficient documentation

## 2018-11-09 DIAGNOSIS — F17210 Nicotine dependence, cigarettes, uncomplicated: Secondary | ICD-10-CM | POA: Diagnosis not present

## 2018-11-09 DIAGNOSIS — Z7951 Long term (current) use of inhaled steroids: Secondary | ICD-10-CM | POA: Diagnosis not present

## 2018-11-09 DIAGNOSIS — F329 Major depressive disorder, single episode, unspecified: Secondary | ICD-10-CM | POA: Diagnosis not present

## 2018-11-09 DIAGNOSIS — M542 Cervicalgia: Secondary | ICD-10-CM | POA: Diagnosis present

## 2018-11-09 DIAGNOSIS — G4733 Obstructive sleep apnea (adult) (pediatric): Secondary | ICD-10-CM | POA: Diagnosis not present

## 2018-11-09 DIAGNOSIS — E785 Hyperlipidemia, unspecified: Secondary | ICD-10-CM | POA: Diagnosis not present

## 2018-11-09 HISTORY — PX: ANTERIOR CERVICAL DECOMP/DISCECTOMY FUSION: SHX1161

## 2018-11-09 SURGERY — ANTERIOR CERVICAL DECOMPRESSION/DISCECTOMY FUSION 1 LEVEL
Anesthesia: General | Site: Spine Cervical

## 2018-11-09 MED ORDER — ALBUTEROL SULFATE (2.5 MG/3ML) 0.083% IN NEBU: 3.0000 mL | INHALATION_SOLUTION | Freq: Four times a day (QID) | RESPIRATORY_TRACT | Status: DC | PRN

## 2018-11-09 MED ORDER — BUPIVACAINE HCL (PF) 0.5 % IJ SOLN
INTRAMUSCULAR | Status: AC
  Filled 2018-11-09: qty 30

## 2018-11-09 MED ORDER — ROCURONIUM BROMIDE 50 MG/5ML IV SOSY
PREFILLED_SYRINGE | INTRAVENOUS | Status: DC | PRN
  Administered 2018-11-09: 20 mg via INTRAVENOUS
  Administered 2018-11-09: 30 mg via INTRAVENOUS
  Administered 2018-11-09: 70 mg via INTRAVENOUS

## 2018-11-09 MED ORDER — GABAPENTIN 100 MG PO CAPS
100.0000 mg | ORAL_CAPSULE | Freq: Two times a day (BID) | ORAL | Status: DC
  Administered 2018-11-09 – 2018-11-10 (×2): 100 mg via ORAL
  Filled 2018-11-09 (×2): qty 1

## 2018-11-09 MED ORDER — DEXAMETHASONE SODIUM PHOSPHATE 10 MG/ML IJ SOLN
INTRAMUSCULAR | Status: DC | PRN
  Administered 2018-11-09: 10 mg via INTRAVENOUS

## 2018-11-09 MED ORDER — ATORVASTATIN CALCIUM 40 MG PO TABS
40.0000 mg | ORAL_TABLET | Freq: Every day | ORAL | Status: DC
  Administered 2018-11-10: 40 mg via ORAL
  Filled 2018-11-09: qty 1

## 2018-11-09 MED ORDER — LOSARTAN POTASSIUM 50 MG PO TABS
50.0000 mg | ORAL_TABLET | Freq: Every day | ORAL | Status: DC
  Administered 2018-11-09: 50 mg via ORAL
  Filled 2018-11-09: qty 1

## 2018-11-09 MED ORDER — ADULT MULTIVITAMIN W/MINERALS CH
1.0000 | ORAL_TABLET | Freq: Every day | ORAL | Status: DC
  Administered 2018-11-10: 1 via ORAL
  Filled 2018-11-09: qty 1

## 2018-11-09 MED ORDER — EPHEDRINE SULFATE-NACL 50-0.9 MG/10ML-% IV SOSY
PREFILLED_SYRINGE | INTRAVENOUS | Status: DC | PRN
  Administered 2018-11-09: 5 mg via INTRAVENOUS

## 2018-11-09 MED ORDER — PANTOPRAZOLE SODIUM 40 MG IV SOLR: 40.0000 mg | Freq: Every day | INTRAVENOUS | Status: DC

## 2018-11-09 MED ORDER — DOCUSATE SODIUM 100 MG PO CAPS
100.0000 mg | ORAL_CAPSULE | Freq: Two times a day (BID) | ORAL | Status: DC
  Administered 2018-11-09 – 2018-11-10 (×2): 100 mg via ORAL
  Filled 2018-11-09 (×2): qty 1

## 2018-11-09 MED ORDER — ACETAMINOPHEN 500 MG PO TABS
1000.0000 mg | ORAL_TABLET | Freq: Once | ORAL | Status: AC
  Administered 2018-11-09: 07:00:00 1000 mg via ORAL

## 2018-11-09 MED ORDER — KETAMINE HCL 50 MG/5ML IJ SOSY
PREFILLED_SYRINGE | INTRAMUSCULAR | Status: AC
  Filled 2018-11-09: qty 5

## 2018-11-09 MED ORDER — ONDANSETRON HCL 4 MG/2ML IJ SOLN: 4.0000 mg | Freq: Four times a day (QID) | INTRAMUSCULAR | Status: DC | PRN

## 2018-11-09 MED ORDER — SODIUM CHLORIDE 0.9 % IV SOLN
INTRAVENOUS | Status: DC | PRN
  Administered 2018-11-09: 35 ug/min via INTRAVENOUS
  Administered 2018-11-09: 25 ug/min via INTRAVENOUS

## 2018-11-09 MED ORDER — LACTATED RINGERS IV SOLN
INTRAVENOUS | Status: DC | PRN
  Administered 2018-11-09 (×2): via INTRAVENOUS

## 2018-11-09 MED ORDER — CEFAZOLIN SODIUM-DEXTROSE 2-4 GM/100ML-% IV SOLN
INTRAVENOUS | Status: AC
  Filled 2018-11-09: qty 100

## 2018-11-09 MED ORDER — SODIUM CHLORIDE 0.9% FLUSH: 3.0000 mL | INTRAVENOUS | Status: DC | PRN

## 2018-11-09 MED ORDER — LORATADINE 10 MG PO TABS
10.0000 mg | ORAL_TABLET | Freq: Every day | ORAL | Status: DC
  Administered 2018-11-10: 10 mg via ORAL
  Filled 2018-11-09: qty 1

## 2018-11-09 MED ORDER — LIDOCAINE 2% (20 MG/ML) 5 ML SYRINGE
INTRAMUSCULAR | Status: DC | PRN
  Administered 2018-11-09: 40 mg via INTRAVENOUS

## 2018-11-09 MED ORDER — BUDESONIDE 0.5 MG/2ML IN SUSP
0.5000 mg | Freq: Two times a day (BID) | RESPIRATORY_TRACT | Status: DC
  Administered 2018-11-10: 0.5 mg via RESPIRATORY_TRACT
  Filled 2018-11-09 (×4): qty 2

## 2018-11-09 MED ORDER — BACLOFEN 10 MG PO TABS: 10.0000 mg | ORAL_TABLET | Freq: Two times a day (BID) | ORAL | Status: DC

## 2018-11-09 MED ORDER — CELECOXIB 200 MG PO CAPS
ORAL_CAPSULE | ORAL | Status: AC
  Filled 2018-11-09: qty 1

## 2018-11-09 MED ORDER — ACETAMINOPHEN 325 MG PO TABS: 650.0000 mg | ORAL_TABLET | ORAL | Status: DC | PRN

## 2018-11-09 MED ORDER — PHENOL 1.4 % MT LIQD
1.0000 | OROMUCOSAL | Status: DC | PRN
  Administered 2018-11-10: 1 via OROMUCOSAL
  Filled 2018-11-09: qty 177

## 2018-11-09 MED ORDER — 0.9 % SODIUM CHLORIDE (POUR BTL) OPTIME
TOPICAL | Status: DC | PRN
  Administered 2018-11-09 (×2): 1000 mL

## 2018-11-09 MED ORDER — FLUTICASONE PROPIONATE 50 MCG/ACT NA SUSP
2.0000 | Freq: Every day | NASAL | Status: DC | PRN
  Filled 2018-11-09: qty 16

## 2018-11-09 MED ORDER — CELECOXIB 200 MG PO CAPS
400.0000 mg | ORAL_CAPSULE | Freq: Once | ORAL | Status: AC
  Administered 2018-11-09: 07:00:00 400 mg via ORAL

## 2018-11-09 MED ORDER — PHENYLEPHRINE 40 MCG/ML (10ML) SYRINGE FOR IV PUSH (FOR BLOOD PRESSURE SUPPORT)
PREFILLED_SYRINGE | INTRAVENOUS | Status: AC
  Filled 2018-11-09: qty 10

## 2018-11-09 MED ORDER — FENTANYL CITRATE (PF) 250 MCG/5ML IJ SOLN
INTRAMUSCULAR | Status: AC
  Filled 2018-11-09: qty 5

## 2018-11-09 MED ORDER — ACETAMINOPHEN 650 MG RE SUPP: 650.0000 mg | RECTAL | Status: DC | PRN

## 2018-11-09 MED ORDER — FLEET ENEMA 7-19 GM/118ML RE ENEM: 1.0000 | ENEMA | Freq: Once | RECTAL | Status: DC | PRN

## 2018-11-09 MED ORDER — CELECOXIB 200 MG PO CAPS
ORAL_CAPSULE | ORAL | Status: AC
  Administered 2018-11-09: 400 mg via ORAL
  Filled 2018-11-09: qty 1

## 2018-11-09 MED ORDER — BISACODYL 5 MG PO TBEC: 5.0000 mg | DELAYED_RELEASE_TABLET | Freq: Every day | ORAL | Status: DC | PRN

## 2018-11-09 MED ORDER — FENTANYL CITRATE (PF) 100 MCG/2ML IJ SOLN
INTRAMUSCULAR | Status: DC | PRN
  Administered 2018-11-09: 100 ug via INTRAVENOUS
  Administered 2018-11-09: 25 ug via INTRAVENOUS
  Administered 2018-11-09: 50 ug via INTRAVENOUS
  Administered 2018-11-09: 25 ug via INTRAVENOUS
  Administered 2018-11-09: 50 ug via INTRAVENOUS

## 2018-11-09 MED ORDER — CHLORHEXIDINE GLUCONATE 4 % EX LIQD: 60.0000 mL | Freq: Once | CUTANEOUS | Status: DC

## 2018-11-09 MED ORDER — ROCURONIUM BROMIDE 10 MG/ML (PF) SYRINGE
PREFILLED_SYRINGE | INTRAVENOUS | Status: AC
  Filled 2018-11-09: qty 10

## 2018-11-09 MED ORDER — THROMBIN 20000 UNITS EX SOLR
CUTANEOUS | Status: AC
  Filled 2018-11-09: qty 20000

## 2018-11-09 MED ORDER — TOPIRAMATE 100 MG PO TABS: 200.0000 mg | ORAL_TABLET | Freq: Every day | ORAL | Status: DC | PRN

## 2018-11-09 MED ORDER — TIZANIDINE HCL 4 MG PO TABS
4.0000 mg | ORAL_TABLET | Freq: Four times a day (QID) | ORAL | Status: DC | PRN
  Administered 2018-11-09: 4 mg via ORAL
  Filled 2018-11-09 (×2): qty 1

## 2018-11-09 MED ORDER — HYDROCODONE-ACETAMINOPHEN 7.5-325 MG PO TABS
1.0000 | ORAL_TABLET | Freq: Four times a day (QID) | ORAL | Status: DC
  Administered 2018-11-09 (×2): 1 via ORAL
  Filled 2018-11-09 (×3): qty 1

## 2018-11-09 MED ORDER — NALTREXONE HCL 50 MG PO TABS: 50.0000 mg | ORAL_TABLET | Freq: Every day | ORAL | Status: DC

## 2018-11-09 MED ORDER — MIDAZOLAM HCL 2 MG/2ML IJ SOLN
INTRAMUSCULAR | Status: AC
  Filled 2018-11-09: qty 2

## 2018-11-09 MED ORDER — CEFAZOLIN SODIUM-DEXTROSE 2-4 GM/100ML-% IV SOLN
2.0000 g | Freq: Three times a day (TID) | INTRAVENOUS | Status: AC
  Administered 2018-11-09 (×2): 2 g via INTRAVENOUS
  Filled 2018-11-09 (×2): qty 100

## 2018-11-09 MED ORDER — PROPOFOL 10 MG/ML IV BOLUS
INTRAVENOUS | Status: AC
  Filled 2018-11-09: qty 20

## 2018-11-09 MED ORDER — CEFAZOLIN SODIUM-DEXTROSE 2-4 GM/100ML-% IV SOLN
2.0000 g | INTRAVENOUS | Status: AC
  Administered 2018-11-09: 2 g via INTRAVENOUS

## 2018-11-09 MED ORDER — OLANZAPINE 2.5 MG PO TABS
2.5000 mg | ORAL_TABLET | Freq: Every day | ORAL | Status: DC
  Administered 2018-11-09: 2.5 mg via ORAL
  Filled 2018-11-09 (×2): qty 1

## 2018-11-09 MED ORDER — KETAMINE HCL 10 MG/ML IJ SOLN
INTRAMUSCULAR | Status: DC | PRN
  Administered 2018-11-09: 10 mg via INTRAVENOUS

## 2018-11-09 MED ORDER — LIDOCAINE 2% (20 MG/ML) 5 ML SYRINGE
INTRAMUSCULAR | Status: AC
  Filled 2018-11-09: qty 5

## 2018-11-09 MED ORDER — FENTANYL CITRATE (PF) 100 MCG/2ML IJ SOLN: 25.0000 ug | INTRAMUSCULAR | Status: DC | PRN

## 2018-11-09 MED ORDER — SODIUM CHLORIDE 0.9% FLUSH
3.0000 mL | Freq: Two times a day (BID) | INTRAVENOUS | Status: DC
  Administered 2018-11-09 (×2): 3 mL via INTRAVENOUS

## 2018-11-09 MED ORDER — MIDAZOLAM HCL 5 MG/5ML IJ SOLN
INTRAMUSCULAR | Status: DC | PRN
  Administered 2018-11-09: 2 mg via INTRAVENOUS

## 2018-11-09 MED ORDER — TRAZODONE HCL 100 MG PO TABS
100.0000 mg | ORAL_TABLET | Freq: Every evening | ORAL | Status: DC | PRN
  Administered 2018-11-09: 100 mg via ORAL
  Filled 2018-11-09 (×2): qty 1

## 2018-11-09 MED ORDER — SODIUM CHLORIDE 0.9 % IV SOLN: INTRAVENOUS | Status: DC

## 2018-11-09 MED ORDER — SODIUM CHLORIDE 0.9 % IV SOLN
10.0000 ug/kg/min | INTRAVENOUS | Status: AC
  Administered 2018-11-09: 10 ug/kg/min via INTRAVENOUS
  Filled 2018-11-09 (×2): qty 2

## 2018-11-09 MED ORDER — ONDANSETRON HCL 4 MG PO TABS: 4.0000 mg | ORAL_TABLET | Freq: Four times a day (QID) | ORAL | Status: DC | PRN

## 2018-11-09 MED ORDER — ALBUMIN HUMAN 5 % IV SOLN
INTRAVENOUS | Status: DC | PRN
  Administered 2018-11-09: 09:00:00 via INTRAVENOUS

## 2018-11-09 MED ORDER — MENTHOL 3 MG MT LOZG: 1.0000 | LOZENGE | OROMUCOSAL | Status: DC | PRN

## 2018-11-09 MED ORDER — ACETAMINOPHEN 500 MG PO TABS
ORAL_TABLET | ORAL | Status: AC
  Administered 2018-11-09: 1000 mg via ORAL
  Filled 2018-11-09: qty 2

## 2018-11-09 MED ORDER — AMLODIPINE BESYLATE 5 MG PO TABS
5.0000 mg | ORAL_TABLET | Freq: Every day | ORAL | Status: DC
  Administered 2018-11-09: 5 mg via ORAL
  Filled 2018-11-09: qty 1

## 2018-11-09 MED ORDER — BUPIVACAINE HCL 0.5 % IJ SOLN
INTRAMUSCULAR | Status: DC | PRN
  Administered 2018-11-09: 10 mL

## 2018-11-09 MED ORDER — LABETALOL HCL 5 MG/ML IV SOLN
INTRAVENOUS | Status: AC
  Filled 2018-11-09: qty 4

## 2018-11-09 MED ORDER — LABETALOL HCL 5 MG/ML IV SOLN
10.0000 mg | INTRAVENOUS | Status: DC | PRN
  Administered 2018-11-09: 10 mg via INTRAVENOUS

## 2018-11-09 MED ORDER — HYDROCODONE-ACETAMINOPHEN 7.5-325 MG PO TABS
1.0000 | ORAL_TABLET | ORAL | Status: DC | PRN
  Administered 2018-11-10: 1 via ORAL
  Filled 2018-11-09: qty 1

## 2018-11-09 MED ORDER — PANTOPRAZOLE SODIUM 40 MG PO TBEC
40.0000 mg | DELAYED_RELEASE_TABLET | Freq: Every day | ORAL | Status: DC
  Administered 2018-11-09 – 2018-11-10 (×2): 40 mg via ORAL
  Filled 2018-11-09 (×2): qty 1

## 2018-11-09 MED ORDER — ONDANSETRON HCL 4 MG/2ML IJ SOLN
INTRAMUSCULAR | Status: DC | PRN
  Administered 2018-11-09: 4 mg via INTRAVENOUS

## 2018-11-09 MED ORDER — HYDROCODONE-ACETAMINOPHEN 7.5-325 MG PO TABS
2.0000 | ORAL_TABLET | ORAL | Status: DC | PRN
  Administered 2018-11-09: 2 via ORAL
  Filled 2018-11-09: qty 2

## 2018-11-09 MED ORDER — PROPOFOL 1000 MG/100ML IV EMUL
INTRAVENOUS | Status: AC
  Filled 2018-11-09: qty 100

## 2018-11-09 MED ORDER — BUPIVACAINE LIPOSOME 1.3 % IJ SUSP
20.0000 mL | INTRAMUSCULAR | Status: AC
  Administered 2018-11-09: 20 mL
  Filled 2018-11-09: qty 20

## 2018-11-09 MED ORDER — FLUOXETINE HCL 20 MG PO CAPS
20.0000 mg | ORAL_CAPSULE | Freq: Every day | ORAL | Status: DC
  Administered 2018-11-09: 20 mg via ORAL
  Filled 2018-11-09: qty 1

## 2018-11-09 MED ORDER — MORPHINE SULFATE (PF) 2 MG/ML IV SOLN: 1.0000 mg | INTRAVENOUS | Status: DC | PRN

## 2018-11-09 MED ORDER — SUGAMMADEX SODIUM 200 MG/2ML IV SOLN
INTRAVENOUS | Status: DC | PRN
  Administered 2018-11-09: 200 mg via INTRAVENOUS

## 2018-11-09 MED ORDER — PROPOFOL 10 MG/ML IV BOLUS
INTRAVENOUS | Status: DC | PRN
  Administered 2018-11-09: 150 mg via INTRAVENOUS

## 2018-11-09 MED ORDER — PROMETHAZINE HCL 25 MG/ML IJ SOLN: 6.2500 mg | INTRAMUSCULAR | Status: DC | PRN

## 2018-11-09 MED ORDER — ALUM & MAG HYDROXIDE-SIMETH 200-200-20 MG/5ML PO SUSP: 30.0000 mL | Freq: Four times a day (QID) | ORAL | Status: DC | PRN

## 2018-11-09 MED ORDER — THROMBIN 20000 UNITS EX SOLR
CUTANEOUS | Status: DC | PRN
  Administered 2018-11-09: 20 mL

## 2018-11-09 MED ORDER — DEXAMETHASONE SODIUM PHOSPHATE 10 MG/ML IJ SOLN
INTRAMUSCULAR | Status: AC
  Filled 2018-11-09: qty 1

## 2018-11-09 MED ORDER — POLYETHYLENE GLYCOL 3350 17 G PO PACK: 17.0000 g | PACK | Freq: Every day | ORAL | Status: DC | PRN

## 2018-11-09 SURGICAL SUPPLY — 63 items
ADH SKN CLS APL DERMABOND .7 (GAUZE/BANDAGES/DRESSINGS) ×1
APL SKNCLS STERI-STRIP NONHPOA (GAUZE/BANDAGES/DRESSINGS) ×1
BENZOIN TINCTURE PRP APPL 2/3 (GAUZE/BANDAGES/DRESSINGS) ×2 IMPLANT
BIT DRILL SKYLINE 16MM (BIT) ×1 IMPLANT
BLADE CLIPPER SURG (BLADE) IMPLANT
BONE VIVIGEN FORMABLE 1.3CC (Bone Implant) ×2 IMPLANT
BUR MATCHSTICK NEURO 3.0 LAGG (BURR) IMPLANT
BUR RND FLUTED 2.5 (BURR) ×1 IMPLANT
BUR SABER RD CUTTING 3.0 (BURR) IMPLANT
CANISTER SUCT 3000ML PPV (MISCELLANEOUS) ×2 IMPLANT
COLLAR CERV LO CONTOUR FIRM DE (SOFTGOODS) ×1 IMPLANT
DERMABOND ADVANCED (GAUZE/BANDAGES/DRESSINGS) ×1
DERMABOND ADVANCED .7 DNX12 (GAUZE/BANDAGES/DRESSINGS) ×1 IMPLANT
DRAIN TLS ROUND 10FR (DRAIN) IMPLANT
DRAPE C-ARM 42X72 X-RAY (DRAPES) ×1 IMPLANT
DRAPE MICROSCOPE LEICA (MISCELLANEOUS) ×2 IMPLANT
DRAPE POUCH INSTRU U-SHP 10X18 (DRAPES) ×2 IMPLANT
DRAPE SURG 17X23 STRL (DRAPES) ×6 IMPLANT
DRILL SKYLINE 16MM (BIT) ×2
DRSG MEPILEX BORDER 4X4 (GAUZE/BANDAGES/DRESSINGS) ×1 IMPLANT
DURAPREP 6ML APPLICATOR 50/CS (WOUND CARE) ×2 IMPLANT
ELECT BLADE 4.0 EZ CLEAN MEGAD (MISCELLANEOUS)
ELECT COATED BLADE 2.86 ST (ELECTRODE) ×2 IMPLANT
ELECT REM PT RETURN 9FT ADLT (ELECTROSURGICAL) ×2
ELECTRODE BLDE 4.0 EZ CLN MEGD (MISCELLANEOUS) IMPLANT
ELECTRODE REM PT RTRN 9FT ADLT (ELECTROSURGICAL) ×1 IMPLANT
GLOVE BIOGEL PI IND STRL 8 (GLOVE) ×1 IMPLANT
GLOVE BIOGEL PI INDICATOR 8 (GLOVE) ×1
GLOVE ECLIPSE 9.0 STRL (GLOVE) ×2 IMPLANT
GLOVE ORTHO TXT STRL SZ7.5 (GLOVE) ×2 IMPLANT
GLOVE SURG 8.5 LATEX PF (GLOVE) ×2 IMPLANT
GOWN STRL REUS W/ TWL LRG LVL3 (GOWN DISPOSABLE) ×1 IMPLANT
GOWN STRL REUS W/TWL 2XL LVL3 (GOWN DISPOSABLE) ×4 IMPLANT
GOWN STRL REUS W/TWL LRG LVL3 (GOWN DISPOSABLE) ×2
GRAFT BNE MATRIX VG FRMBL SM 1 (Bone Implant) IMPLANT
HALTER HD/CHIN CERV TRACTION D (MISCELLANEOUS) ×2 IMPLANT
KIT BASIN OR (CUSTOM PROCEDURE TRAY) ×2 IMPLANT
KIT TURNOVER KIT B (KITS) ×2 IMPLANT
NDL SPNL 20GX3.5 QUINCKE YW (NEEDLE) ×2 IMPLANT
NEEDLE SPNL 20GX3.5 QUINCKE YW (NEEDLE) ×4 IMPLANT
NS IRRIG 1000ML POUR BTL (IV SOLUTION) ×3 IMPLANT
PACK ORTHO CERVICAL (CUSTOM PROCEDURE TRAY) ×2 IMPLANT
PAD ARMBOARD 7.5X6 YLW CONV (MISCELLANEOUS) ×4 IMPLANT
PATTIES SURGICAL .5 X.5 (GAUZE/BANDAGES/DRESSINGS) ×2 IMPLANT
PIN DISTRACTION 14 (PIN) ×2 IMPLANT
PIN DISTRACTION 14MM (PIN) ×2 IMPLANT
PLATE ONE LEVEL SKYLINE 14MM (Plate) ×1 IMPLANT
SCREW SELF TAP VARIABLE 11MM (Screw) ×4 IMPLANT
SPACER ADV ACF 9MM (Bone Implant) ×1 IMPLANT
SPONGE INTESTINAL PEANUT (DISPOSABLE) ×2 IMPLANT
SPONGE LAP 4X18 RFD (DISPOSABLE) IMPLANT
SPONGE SURGIFOAM ABS GEL 100 (HEMOSTASIS) ×2 IMPLANT
STRIP CLOSURE SKIN 1/2X4 (GAUZE/BANDAGES/DRESSINGS) ×2 IMPLANT
SUT ETHILON 4 0 PS 2 18 (SUTURE) IMPLANT
SUT VIC AB 2-0 CT1 27 (SUTURE) ×2
SUT VIC AB 2-0 CT1 TAPERPNT 27 (SUTURE) ×1 IMPLANT
SUT VIC AB 3-0 X1 27 (SUTURE) IMPLANT
SUT VICRYL 4-0 PS2 18IN ABS (SUTURE) ×2 IMPLANT
SYR 20ML LL LF (SYRINGE) ×2 IMPLANT
SYSTEM CHEST DRAIN TLS 7FR (DRAIN) IMPLANT
TOWEL GREEN STERILE (TOWEL DISPOSABLE) ×2 IMPLANT
TOWEL GREEN STERILE FF (TOWEL DISPOSABLE) ×2 IMPLANT
WATER STERILE IRR 1000ML POUR (IV SOLUTION) ×2 IMPLANT

## 2018-11-09 NOTE — Brief Op Note (Signed)
11/09/2018  10:02 AM  PATIENT:  Gregory [name]  58 y.o. male  PRE-OPERATIVE DIAGNOSIS:  Cervical Five-Cervical Six herniated disc  POST-OPERATIVE DIAGNOSIS:  Cervical Five-Cervical Six herniated disc  PROCEDURE:  Procedure(s) with comments: ANTERIOR CERVICAL DISCECTOMY FUSION CERVICAL FIVE-CERVICAL SIX (N/A) - ANTERIOR CERVICAL DISCECTOMY FUSION CERVICAL FIVE-CERVICAL SIX  SURGEON:  Surgeon(s) and Role:    Jessy Oto, MD - Primary  PHYSICIAN ASSISTANT: Benjiman Core, PA-C  ANESTHESIA:   local and general, Theodoro Grist, CRNA   EBL:  50 mL   BLOOD ADMINISTERED:none  DRAINS: none   LOCAL MEDICATIONS USED:  MARCAINE 0.5% 1:1 EXPAREL 1.3% Amount: 10 ml  SPECIMEN:  No Specimen  DISPOSITION OF SPECIMEN:  N/A  COUNTS:  YES  TOURNIQUET:  * No tourniquets in log *  DICTATION: .Dragon Dictation  PLAN OF CARE: Admit for overnight observation  PATIENT DISPOSITION:  PACU - hemodynamically stable.   Delay start of Pharmacological VTE agent (>24hrs) due to surgical blood loss or risk of bleeding: yes

## 2018-11-09 NOTE — Transfer of Care (Signed)
Immediate Anesthesia Transfer of Care Note  [name]  Procedure(s) Performed: ANTERIOR CERVICAL DISCECTOMY FUSION CERVICAL FIVE-CERVICAL SIX (N/A Spine Cervical)  Patient Location: PACU  Anesthesia Type:General  Level of Consciousness: awake, alert , sedated, drowsy, patient cooperative and responds to stimulation  Airway & Oxygen Therapy: Patient connected to face mask oxygen  Post-op Assessment: Post -op Vital signs reviewed and stable  Post vital signs: stable  Last Vitals:  Vitals Value Taken Time  BP 171/116 11/09/18 1034  Temp    Pulse 91 11/09/18 1035  Resp 22 11/09/18 1035  SpO2 99 % 11/09/18 1035  Vitals shown include unvalidated device data.  Last Pain:  Vitals:   11/09/18 0627  TempSrc: Oral  PainSc: 0-No pain         Complications: No apparent anesthesia complications

## 2018-11-09 NOTE — Interval H&P Note (Signed)
History and Physical Interval Note:  11/09/2018 7:29 AM  Gregory [name]  has presented today for surgery, with the diagnosis of C5-6 herniated disc.  The various methods of treatment have been discussed with the patient and family. After consideration of risks, benefits and other options for treatment, the patient has consented to  Procedure(s): ANTERIOR CERVICAL DISCECTOMY FUSION C5-6 (N/A) as a surgical intervention.  The patient's history has been reviewed, patient examined, no change in status, stable for surgery.  I have reviewed the patient's chart and labs.  Questions were answered to the patient's satisfaction.     [name]
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
## 2018-11-09 NOTE — Op Note (Signed)
11/09/2018  10:05 AM  PATIENT:  Gregory [name]  58 y.o. male  MRN: [id number]  OPERATIVE REPORT  PRE-OPERATIVE DIAGNOSIS:  Cervical Five-Cervical Six herniated disc  POST-OPERATIVE DIAGNOSIS:  Cervical Five-Cervical Six herniated disc  PROCEDURE:  Procedure(s): ANTERIOR CERVICAL DISCECTOMY FUSION CERVICAL FIVE-CERVICAL SIX    SURGEON:  James E. Nitka, MD     ASSISTANT:  James Owens, PA-C  (Present throughout the entire procedure and necessary for completion of procedure in a timely manner)     ANESTHESIA:  General,    COMPLICATIONS:  None.   EBL: 50CC  DRAINS: none    COMPONENTS:  Implant Name Type Inv. Item Serial No. Manufacturer Lot No. LRB No. Used Action  BONE VIVIGEN FORMABLE 1.3CC - [id number] Bone Implant BONE VIVIGEN FORMABLE 1.3CC [id number] [employer]  N/A 1 Implanted  advanced ACF, Lordotic 9mm Cage  [id number]  [id number] N/A 1 Implanted  SCREW SELF TAP VARIABLE 11MM - [id number] Screw SCREW SELF TAP VARIABLE 11MM  [employer]  N/A 4 Implanted  PLATE ONE LEVEL SKYLINE 14MM - [id number] Plate PLATE ONE LEVEL SKYLINE 14MM  [employer]  N/A 1 Implanted    PROCEDURE:The patient was met in the holding area, and the appropriate  left C5.6 cervical level identified and marked with an "x" and my initials. The patient was then transported to OR and was placed on the operative table in a supine position head supported on the well padded Mayfield horseshoe. The patient was then placed under  general anesthesia without difficulty intubated atraumaticly.      Cervical spine was positioned with a Mayfield horseshoe and 5 pound cervical halter traction. All pressure points well padded and semi-beach chair position. Arm holder both arms. Standard prep with DuraPrep solution the anterior cervical spine chest. Draped in the usual manner. Iodine vi drape was used. Standard timeout protocol was carried out identifying the patient  procedure side of the procedure and level. The skin the left neck was infiltrated with Marcaine 0.5% 1:1 Exparel 1.3% total of 10 cc. This at the level of expected C5-6 incision and also along a skin crease in line with the patients lines of Langer. Incision transverse at the C6 level and carried down to the level of the platysma. Then was carried down to the anterior aspect of the sternocleidomastoid muscle. The interval between the trachea and esophagus medially and the carotid sheath laterally was developed as a Metzenbaum scissors and blunt dissection exposing the anterior aspect of cervical spine at the C6 level.  The prevertebral fascia anterior to the cervical was cauterized with bipolar and teased across the midline with a Kitner dissector. An 18-gauge spinal needle was placed with sheath intact allowing only a centimeter to extend into the C5-6 disc and observed on lateral radiogragh  at the C5-6 level. Unfortunately the spinal needle removed or came away without localization of the disc and second cross table lateral radiograph was necessary to reconfirm the C5-6 level.  Handheld Cloward retraction of the soft tissues while identifying the level at C5-6 and also while removing a portion of the anterior aspect of the disc with15 blade scalpel and pituitary. Medial border of the longus collie muscles was carefully elevated bilaterally and self-retaining retractors were introduced the foot of the blade beneath the medial border of the longus colli muscles. Soft tissue overlying the anterior borders of the disc space at C5-6 level carefully debridement of soft tissue back to bony edges. The anterior   lip osteophytes were then resected using rongeur. This bone was preserved for later bone grafting purposes. The disc space was then first prepared using loupe magnification and headlight with resection of degenerative disc annulus anteriorly and posteriorly and cartilaginous endplates using micro-curettes  pituitaries and a high-speed bur. The operating room microscope was draped sterilely and brought into the field. Under the operating room microscope and posterior aspect of the disc was excised using micro-curettes pituitary rongeur and times per. Posterior lip osteophytes were drilled using a high-speed bur and a carefully resected with 1 and 2 mm Kerrison foraminotomy performed over both C6 nerve roots using 1 and 2 mm Kerrisons disc herniation material was noted centrally and into the right foramen some of which represented reflected portion of the patient's annulus into the neuroforamen. Following decompression of the spinal cord and both C6 nerve roots, irrigation was carried out. The endplates of the inferior aspect of C5 and superior aspect of C6 were carefully prepared using a high-speed bur to parallel. The disc space was then sounded utilized and a precontoured sounder for the transgraft implant. Surrounding was carried up to a 73m implant.  9 mm ACDF allograft spacer was felt to provide best fit for the C5-6 disc space. The allograft permanent lordotic allograft implant was then brought onto the field. It was then packed with local bone graft that had been harvested previously and vivigen 1cc. The implant was then carefully placed over the intervertebral disc space at C5-6 level. Care taken to ensure that no bone or soft tissue debris within the disc space that could be retropulsed with insertion of the cage and bone graft. The implant was then impacted into place with the head placed in longitudinal cervical traction.  The implant was felt to be in excellent position alignment. Cervical distraction instrumentation was removed and the screw post holes coated with wax for hemostasis. Length of the plate was then chosen using bone wax applied to a cottonoid string and measuring from the edge of the lower cervical vertebral border of C5 the upper cervical border of C6  A  14 millimeter plate was chosen. 16  mm screws were then placed into the C5  locking the plate in place. Additional 16 mm screws were then placed in the C6 level  cervical traction had been released prior to screw placement. Intraoperative lateral radiograph demonstrated the plates and screws in good position alignment no sign of impingement upon the cervical canal. The graft appeared in good position alignment.  Upper extremity longitudinal traction using wrist restraints was necessary to obtain visualization of the lower cervical level.  This point irrigation was carried out at cervical incision site.The esophagus examined at the cervical level  and found to be normal. Irrigation was again carried out there was no active bleeding present. The incisions were then closed by approximating the deep subcutaneous layers the platysma layer with interrupted 3-0 Vicryl suture and the superficial fascia overlying the sternocleidomastoid muscle with interrupted 3-0 Vicryl sutures. The subcutaneous layers were approximated with interrupted 3-0 Vicryl sutures as were the superficial layers. The skin was closed with a running subcutaneous stitch of 4-0 Vicryl at the operative C6 transverse incision site. Skin was approximated with Dermabond. Mepilex bandage was applied. A Philadelphia collar was then applied to the cervical spine released on the cervical spine and drapes were removed. Physician assistant's responsibilities:James ORicard Dillon PA-C perform the duties of assistant physician and surgeon during this case present from the beginning of the case to the  end of the case. He assisted with careful retraction of neural structures suctioning about her elements including cervical cord and C6 nerve root. Performed closure of the incision the platysma to the skin and application of dressing. He assisted in positioning the patient had removal the patient from the OR table to her stretcher.     James Nitka 11/09/2018, 10:05 AM  

## 2018-11-09 NOTE — Progress Notes (Signed)
Orthopedic Tech Progress Note Patient Details:  Gregory [name] 09-Nov-1960 [id number] RN said patient has on hard collar Patient ID: Gregory [name], male   DOB: [birth year], 59 y.o.   MRN: [id number]   Janit Pagan 11/09/2018, 2:08 PM
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
## 2018-11-09 NOTE — Progress Notes (Signed)
NCM received consult for home health /TOC needs. Per PT evaluation/recommendations: no followup needed. No needs identified per NCM. Whitman Hero RN,BSN,CM [phone]

## 2018-11-09 NOTE — Discharge Instructions (Addendum)
No lifting greater than 10 lbs. No overhead use of arms. °Avoid bending,and twisting neck. °Walk in house for first week them may start to get out slowly increasing distance up to one mile by 3 weeks post op. °Keep incision dry for 3 days, may then bathe and wet incision using a Philadelphia collar when showering. °Call if any fevers >101, chills, or increasing numbness or weakness or increased swelling or drainage. ° °

## 2018-11-09 NOTE — Anesthesia Postprocedure Evaluation (Signed)
Anesthesia Post Note  [name]  Procedure(s) Performed: ANTERIOR CERVICAL DISCECTOMY FUSION CERVICAL FIVE-CERVICAL SIX (N/A Spine Cervical)     Patient location during evaluation: PACU Anesthesia Type: General Level of consciousness: sedated Pain management: pain level controlled Vital Signs Assessment: post-procedure vital signs reviewed and stable Respiratory status: spontaneous breathing and respiratory function stable Cardiovascular status: stable Postop Assessment: no apparent nausea or vomiting Anesthetic complications: no    Last Vitals:  Vitals:   11/09/18 1155 11/09/18 1156  BP: (!) 132/102   Pulse: 72 70  Resp: (!) 9 11  Temp:    SpO2: 97% 97%    Last Pain:  Vitals:   11/09/18 1145  TempSrc:   PainSc: 0-No pain                 Baldwin Racicot DANIEL

## 2018-11-09 NOTE — Anesthesia Procedure Notes (Signed)
Procedure Name: Intubation Date/Time: 11/09/2018 7:47 AM Performed by: Lavell Luster, CRNA Pre-anesthesia Checklist: Patient identified, Emergency Drugs available, Suction available, Patient being monitored and Timeout performed Patient Re-evaluated:Patient Re-evaluated prior to induction Oxygen Delivery Method: Circle system utilized Preoxygenation: Pre-oxygenation with 100% oxygen Induction Type: IV induction Ventilation: Mask ventilation without difficulty Laryngoscope Size: Mac and 4 Grade View: Grade I Tube type: Oral Tube size: 7.5 mm Number of attempts: 1 Placement Confirmation: ETT inserted through vocal cords under direct vision,  positive ETCO2 and breath sounds checked- equal and bilateral Secured at: 22 cm Tube secured with: Tape Dental Injury: Teeth and Oropharynx as per pre-operative assessment

## 2018-11-10 ENCOUNTER — Encounter (HOSPITAL_COMMUNITY): Payer: Self-pay | Admitting: Specialist

## 2018-11-10 ENCOUNTER — Telehealth: Payer: Self-pay | Admitting: Specialist

## 2018-11-10 DIAGNOSIS — R2689 Other abnormalities of gait and mobility: Secondary | ICD-10-CM | POA: Diagnosis not present

## 2018-11-10 DIAGNOSIS — M50122 Cervical disc disorder at C5-C6 level with radiculopathy: Secondary | ICD-10-CM | POA: Diagnosis not present

## 2018-11-10 MED ORDER — DOCUSATE SODIUM 100 MG PO CAPS: 100.0000 mg | ORAL_CAPSULE | Freq: Two times a day (BID) | ORAL | 0 refills | Status: DC

## 2018-11-10 MED ORDER — HYDROCODONE-ACETAMINOPHEN 7.5-325 MG PO TABS: 1.0000 | ORAL_TABLET | Freq: Four times a day (QID) | ORAL | 0 refills | Status: DC | PRN

## 2018-11-10 NOTE — Plan of Care (Signed)
Patient alert and oriented, mae's well, voiding adequate amount of urine, swallowing without difficulty, no c/o pain at time of discharge. Patient discharged home with family. Script and discharged instructions given to patient. Patient and family stated understanding of instructions given. Patient has an appointment with Dr. Nitka 

## 2018-11-10 NOTE — Progress Notes (Signed)
     Subjective: 1 Day Post-Op Procedure(s) (LRB): ANTERIOR CERVICAL DISCECTOMY FUSION CERVICAL FIVE-CERVICAL SIX (N/A) Awake, alert and oriented x 4. Paresthesias right arm is gone, left arm is near normal also, some residual numbness tip of right index and long finger. Voiding well. Standing and walking.   Patient reports pain as moderate.    Objective:   VITALS:  Temp:  [97.3 F (36.3 C)-98.7 F (37.1 C)] 97.7 F (36.5 C) (09/23 0725) Pulse Rate:  [68-102] 85 (09/23 0725) Resp:  [8-22] 18 (09/23 0725) BP: (106-175)/(56-111) 123/70 (09/23 0725) SpO2:  [87 %-100 %] 98 % (09/23 0725)  Neurologically intact ABD soft Neurovascular intact Intact pulses distally Dorsiflexion/Plantar flexion intact Incision: dressing C/D/I, no drainage and No significant neck swelling.  Compartment soft   LABS No results for input(s): HGB, WBC, PLT in the last 72 hours. No results for input(s): NA, K, CL, CO2, BUN, CREATININE, GLUCOSE in the last 72 hours. No results for input(s): LABPT, INR in the last 72 hours.   Assessment/Plan: 1 Day Post-Op Procedure(s) (LRB): ANTERIOR CERVICAL DISCECTOMY FUSION CERVICAL FIVE-CERVICAL SIX (N/A)  Advance diet Up with therapy D/C IV fluids Discharge home with home health  [name] 11/10/2018, 8:36 AM Patient ID: Gregory [name], male   DOB: 31-Dec-1960, 58 y.o.   MRN: [id number]
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
## 2018-11-10 NOTE — Telephone Encounter (Signed)
Patient's wife called. He will not take the Hydrocodone. Would like to know if you could call in Tramadol. Normally he would take 2 Tramadol's. Her call back number is [phone]
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
## 2018-11-15 ENCOUNTER — Other Ambulatory Visit: Payer: Self-pay | Admitting: Specialist

## 2018-11-15 ENCOUNTER — Encounter (HOSPITAL_COMMUNITY): Payer: Self-pay | Admitting: Specialist

## 2018-11-15 MED ORDER — TRAMADOL HCL 50 MG PO TABS: 100.0000 mg | ORAL_TABLET | Freq: Four times a day (QID) | ORAL | 0 refills | Status: AC | PRN

## 2018-11-15 NOTE — Telephone Encounter (Signed)
Rx sent to his pharmacy

## 2018-11-15 NOTE — Telephone Encounter (Signed)
Patient's wife called. He will not take the Hydrocodone. Would like to know if you could call in Tramadol. Normally he would take 2 Tramadol's.

## 2018-11-17 ENCOUNTER — Ambulatory Visit (INDEPENDENT_AMBULATORY_CARE_PROVIDER_SITE_OTHER): Payer: Medicare HMO | Admitting: Specialist

## 2018-11-17 ENCOUNTER — Ambulatory Visit (INDEPENDENT_AMBULATORY_CARE_PROVIDER_SITE_OTHER): Payer: Medicare HMO

## 2018-11-17 ENCOUNTER — Encounter: Payer: Self-pay | Admitting: Specialist

## 2018-11-17 VITALS — BP 141/61 | HR 74 | Ht 67.0 in | Wt 204.0 lb

## 2018-11-17 DIAGNOSIS — Z981 Arthrodesis status: Secondary | ICD-10-CM

## 2018-11-17 DIAGNOSIS — R69 Illness, unspecified: Secondary | ICD-10-CM | POA: Diagnosis not present
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
## 2018-11-18 ENCOUNTER — Encounter: Payer: Self-pay | Admitting: Family Medicine

## 2018-11-18 ENCOUNTER — Ambulatory Visit (INDEPENDENT_AMBULATORY_CARE_PROVIDER_SITE_OTHER): Payer: Medicare HMO | Admitting: Family Medicine

## 2018-11-18 ENCOUNTER — Other Ambulatory Visit: Payer: Self-pay

## 2018-11-18 VITALS — BP 136/68 | HR 86 | Ht 67.0 in | Wt 192.0 lb

## 2018-11-18 DIAGNOSIS — R197 Diarrhea, unspecified: Secondary | ICD-10-CM | POA: Diagnosis not present

## 2018-11-18 DIAGNOSIS — Z789 Other specified health status: Secondary | ICD-10-CM

## 2018-11-18 DIAGNOSIS — Z720 Tobacco use: Secondary | ICD-10-CM | POA: Diagnosis not present

## 2018-11-18 DIAGNOSIS — J302 Other seasonal allergic rhinitis: Secondary | ICD-10-CM | POA: Diagnosis not present

## 2018-11-18 DIAGNOSIS — G43009 Migraine without aura, not intractable, without status migrainosus: Secondary | ICD-10-CM

## 2018-11-18 DIAGNOSIS — Z23 Encounter for immunization: Secondary | ICD-10-CM | POA: Diagnosis not present

## 2018-11-18 NOTE — Progress Notes (Signed)
    Subjective:  Gregory [name] is a 58 y.o. male who presents to the [facility] today with a chief complaint of chronic recurring diarrhea.   HPI: Alcohol consumption heavy Patient still reporting approximately 2 beers per night.  These are cans.Noted that if he is complaining of chronic diarrhea, decreasing beer intake would be likely very helpful.  Diarrhea Patient complaining of chronic diarrhea.  Not getting dehydrated.  Just multiple times per month he will have bouts of diarrhea that he thinks might be related to IBS.  No melena or blood in stool, no significant abdominal pain during this.  I did discuss with him that I think that it is likely his high alcohol intake is heavily influencing this but he would like to discuss with a gastroenterologist.  Referral placed  Need for vaccination against Streptococcus pneumoniae using pneumococcal conjugate vaccine 13 Patient consents to vaccine  Objective:  Physical Exam: BP 136/68   Pulse 86   Ht 5\' 7"  (1.702 m)   Wt 192 lb (87.1 kg)   SpO2 98%   BMI 30.07 kg/m   Gen: NAD, in neck brace from recent neck surgery.  Conversing comfortably CV: RRR with no murmurs appreciated Pulm: NWOB, CTAB with no crackles, wheezes, or rhonchi GI: Normal bowel sounds present. Soft, Nontender, Nondistended. MSK: no edema, cyanosis, or clubbing noted Skin: warm, dry Neuro: grossly normal, moves all extremities, still has some minor tingling sensation in his fingers but all motor control intact. Psych: Normal affect and thought content  No results found for this or any previous visit (from the past 72 hour(s)).   Assessment/Plan:  Alcohol consumption heavy Patient still reporting approximately 2 beers per night.  These are cans.  Noted that if he is complaining of chronic diarrhea, decreasing beer intake would be likely very helpful.  Diarrhea Patient complaining of chronic diarrhea.  Not getting dehydrated.  Just multiple times per month he will  have bouts of diarrhea that he thinks might be related to IBS.  No melena or blood in stool, no significant abdominal pain during this.  I did discuss with him that I think that it is likely his high alcohol intake is heavily influencing this but he would like to discuss with a gastroenterologist.  Referral placed  Need for vaccination against Streptococcus pneumoniae using pneumococcal conjugate vaccine 13 Patient consents to vaccine   Sherene Sires, [place] - PGY3 11/19/2018 8:32 AM

## 2018-11-19 DIAGNOSIS — Z23 Encounter for immunization: Secondary | ICD-10-CM | POA: Insufficient documentation

## 2018-11-19 MED ORDER — TOPIRAMATE 200 MG PO TABS: 200.0000 mg | ORAL_TABLET | Freq: Every day | ORAL | Status: DC | PRN

## 2018-11-19 MED ORDER — CETIRIZINE HCL 10 MG PO TABS: 10.0000 mg | ORAL_TABLET | Freq: Every day | ORAL | Status: DC | PRN

## 2018-11-19 NOTE — Assessment & Plan Note (Signed)
Patient still smoking, a pack to 1/2 pack/day.  We discussed again that it would be helpful for him to quit.  He declines assistance at this time.

## 2018-11-19 NOTE — Assessment & Plan Note (Addendum)
Patient complaining of chronic diarrhea.  Not getting dehydrated.  Just multiple times per month he will have bouts of diarrhea that he thinks might be related to IBS.  No melena or blood in stool, no significant abdominal pain during this.  I did discuss with him that I think that it is likely his high alcohol intake is heavily influencing this but he would like to discuss with a gastroenterologist.  Referral placed

## 2018-11-19 NOTE — Assessment & Plan Note (Signed)
Patient consents to vaccine

## 2018-11-19 NOTE — Assessment & Plan Note (Signed)
Patient still reporting approximately 2 beers per night.  These are cans.  Noted that if he is complaining of chronic diarrhea, decreasing beer intake would be likely very helpful.

## 2018-11-26 ENCOUNTER — Encounter: Payer: Self-pay | Admitting: Nurse Practitioner

## 2018-11-26 ENCOUNTER — Ambulatory Visit (INDEPENDENT_AMBULATORY_CARE_PROVIDER_SITE_OTHER): Payer: Medicare HMO | Admitting: Nurse Practitioner

## 2018-11-26 VITALS — BP 130/80 | HR 78 | Temp 98.2°F | Ht 67.0 in | Wt 195.0 lb

## 2018-11-26 DIAGNOSIS — R197 Diarrhea, unspecified: Secondary | ICD-10-CM

## 2018-11-26 DIAGNOSIS — R1310 Dysphagia, unspecified: Secondary | ICD-10-CM | POA: Diagnosis not present

## 2018-11-26 NOTE — Progress Notes (Signed)
Assessment and plan reviewed 
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
## 2018-11-26 NOTE — Patient Instructions (Signed)
If you are age 60 or older, your body mass index should be between 23-30. Your Body mass index is 30.54 kg/m. If this is out of the aforementioned range listed, please consider follow up with your Primary Care Provider.  If you are age 65 or younger, your body mass index should be between 19-25. Your Body mass index is 30.54 kg/m. If this is out of the aformentioned range listed, please consider follow up with your Primary Care Provider.   You have been scheduled for a Barium Esophogram at [facility] (1st floor of the hospital) on 12/03/18 at 10:30 am. Please arrive 15 minutes prior to your appointment for registration. Make certain not to have anything to eat or drink 3 hours prior to your test. If you need to reschedule for any reason, please contact radiology at [phone] to do so. __________________________________________________________________ A barium swallow is an examination that concentrates on views of the esophagus. This tends to be a double contrast exam (barium and two liquids which, when combined, create a gas to distend the wall of the oesophagus) or single contrast (non-ionic iodine based). The study is usually tailored to your symptoms so a good history is essential. Attention is paid during the study to the form, structure and configuration of the esophagus, looking for functional disorders (such as aspiration, dysphagia, achalasia, motility and reflux) EXAMINATION You may be asked to change into a gown, depending on the type of swallow being performed. A radiologist and radiographer will perform the procedure. The radiologist will advise you of the type of contrast selected for your procedure and direct you during the exam. You will be asked to stand, sit or lie in several different positions and to hold a small amount of fluid in your mouth before being asked to swallow while the imaging is performed .In some instances you may be asked to swallow barium coated  marshmallows to assess the motility of a solid food bolus. The exam can be recorded as a digital or video fluoroscopy procedure. POST PROCEDURE It will take 1-2 days for the barium to pass through your system. To facilitate this, it is important, unless otherwise directed, to increase your fluids for the next 24-48hrs and to resume your normal diet.  This test typically takes about 30 minutes to perform. _____________________________________________________________________  Keep a food journal.  Follow up with me on 12/15/18 at 11 am.  Thank you for choosing me and [facility].   Tye Savoy, NP

## 2018-12-03 ENCOUNTER — Ambulatory Visit (HOSPITAL_COMMUNITY)
Admission: RE | Admit: 2018-12-03 | Discharge: 2018-12-03 | Disposition: A | Discharge status: Home / Self Care | Payer: Medicare HMO | Source: Ambulatory Visit | Attending: Nurse Practitioner | Admitting: Nurse Practitioner

## 2018-12-03 ENCOUNTER — Other Ambulatory Visit: Payer: Self-pay

## 2018-12-03 DIAGNOSIS — R1310 Dysphagia, unspecified: Secondary | ICD-10-CM | POA: Diagnosis not present

## 2018-12-03 DIAGNOSIS — K219 Gastro-esophageal reflux disease without esophagitis: Secondary | ICD-10-CM | POA: Diagnosis not present

## 2018-12-06 ENCOUNTER — Telehealth: Payer: Self-pay

## 2018-12-06 NOTE — Telephone Encounter (Signed)
Patient informed of the results. He is pleased that the results were basically normal.  He continues to have difficulty swallowing drier meats like hamburger, pork chops, and vegetables like corn. He is interested in what Dr Henrene Pastor suggests.

## 2018-12-06 NOTE — Telephone Encounter (Signed)
-----   Message from Willia Craze, NP sent at 12/03/2018  3:53 PM EDT ----- Eustaquio Maize, please let patient know that his swallowing study was basically normal.  No strictures.  The tablet passed easily.  He has mild GERD.  If persistent problems I can discuss with Dr Henrene Pastor about empiric dilation. Thanks

## 2018-12-08 ENCOUNTER — Other Ambulatory Visit: Payer: Self-pay | Admitting: Specialist

## 2018-12-08 NOTE — Telephone Encounter (Signed)
Sent request to Dr. Nitka 

## 2018-12-08 NOTE — Telephone Encounter (Signed)
Based on the previous upper endoscopy which was normal as well as a swallowing study which showed no restrictive abnormality, I would recommend that he keep the solid foods, that are troublesome, moist.  Smaller portions with plenty of water or other beverage.  If problems persist despite these simple and noninvasive measures, we could consider repeat endoscopy in the [facility] with empiric esophageal dilation (understanding that this may or may not help).

## 2018-12-08 NOTE — Telephone Encounter (Signed)
Patient called. He is out of Tramadol. Would like to have some called in to his pharmacy. His call back number is [phone]

## 2018-12-08 NOTE — Telephone Encounter (Signed)
Discussed with the patient. He agrees to this plan of care.

## 2018-12-08 NOTE — Telephone Encounter (Signed)
I will forward to Dr. Henrene Pastor

## 2018-12-08 NOTE — Addendum Note (Signed)
Addended by: Minda Ditto, Alyse Low N on: 12/08/2018 11:58 AM   Modules accepted: Orders

## 2018-12-09 ENCOUNTER — Other Ambulatory Visit: Payer: Self-pay | Admitting: Specialist

## 2018-12-09 MED ORDER — TRAMADOL HCL 50 MG PO TABS: 100.0000 mg | ORAL_TABLET | Freq: Four times a day (QID) | ORAL | 0 refills | Status: DC | PRN

## 2018-12-15 ENCOUNTER — Ambulatory Visit: Payer: Medicare HMO | Admitting: Nurse Practitioner

## 2018-12-15 ENCOUNTER — Ambulatory Visit: Payer: Medicare HMO | Admitting: Specialist

## 2018-12-17 ENCOUNTER — Ambulatory Visit (INDEPENDENT_AMBULATORY_CARE_PROVIDER_SITE_OTHER): Payer: Medicare HMO | Admitting: Specialist

## 2018-12-17 ENCOUNTER — Other Ambulatory Visit: Payer: Self-pay

## 2018-12-17 ENCOUNTER — Ambulatory Visit: Payer: Self-pay

## 2018-12-17 ENCOUNTER — Encounter: Payer: Self-pay | Admitting: Specialist

## 2018-12-17 VITALS — BP 138/87 | HR 73 | Ht 67.0 in | Wt 204.0 lb

## 2018-12-17 DIAGNOSIS — Z981 Arthrodesis status: Secondary | ICD-10-CM

## 2018-12-17 MED ORDER — METHOCARBAMOL 500 MG PO TABS: 500.0000 mg | ORAL_TABLET | Freq: Three times a day (TID) | ORAL | 0 refills | Status: DC | PRN

## 2018-12-17 NOTE — Progress Notes (Signed)
58 year old black male who is about 5 and half weeks status post C5-6 ACDF returns.  Patient admits to not being fully compliant wearing his hard cervical collar.  He has been taking this off at night and sleeping without it.  States that he continues to have some numbness in his right arm.  Left arm is better.  Patient has had ongoing issues with dysphagia that is chronic.  Recently was seen by his GI doctor.  Exam Surgical incision is well-healed.  No much better with swelling.  Plan I reviewed x-rays with patient and his wife who is present.  I obtained flexion and extension views today and it appears that he may have about 2 to 3 mm of motion.  I recommend that he continue the hard collar at least for another couple weeks and told him the importance of being compliant with my instructions of keeping it on at all times.  He will follow-up with me in 2 weeks for recheck on a day that Dr. Louanne Skye is in clinic.  We will repeat x-rays at that time.  He will bring soft collar in with him just in case Dr. Louanne Skye decides to transition him to that.

## 2018-12-30 ENCOUNTER — Ambulatory Visit (INDEPENDENT_AMBULATORY_CARE_PROVIDER_SITE_OTHER): Payer: Medicare HMO | Admitting: Surgery

## 2018-12-30 ENCOUNTER — Encounter: Payer: Self-pay | Admitting: Surgery

## 2018-12-30 ENCOUNTER — Ambulatory Visit: Payer: Self-pay

## 2018-12-30 ENCOUNTER — Ambulatory Visit: Payer: Medicare HMO | Admitting: Nurse Practitioner

## 2018-12-30 ENCOUNTER — Other Ambulatory Visit: Payer: Self-pay

## 2018-12-30 DIAGNOSIS — Z981 Arthrodesis status: Secondary | ICD-10-CM

## 2018-12-30 MED ORDER — TRAMADOL HCL 50 MG PO TABS: 50.0000 mg | ORAL_TABLET | Freq: Four times a day (QID) | ORAL | 0 refills | Status: DC | PRN

## 2018-12-30 NOTE — Progress Notes (Signed)
58 year old black male who is almost 8 weeks out from C5-6 ACDF returns.  States that he has been more compliant with wearing his hard collar.  Comes in for repeat x-ray today.  Needs refill of Ultram.  Plan Dr. Louanne Skye reviewed cervical spine x-rays from today.  Says it is okay for him to transition out of hard collar to soft collar.  Patient can come out of the soft collar when he is sleeping.  Still no aggressive activity.  Refilled Ultram.  Follow-up in 4 weeks with Dr. Louanne Skye for recheck

## 2019-01-10 ENCOUNTER — Ambulatory Visit (INDEPENDENT_AMBULATORY_CARE_PROVIDER_SITE_OTHER)
Admission: EM | Admit: 2019-01-10 | Discharge: 2019-01-10 | Disposition: A | Discharge status: Home / Self Care | Payer: Medicare HMO | Source: Home / Self Care | Attending: Family Medicine | Admitting: Family Medicine

## 2019-01-10 ENCOUNTER — Emergency Department (HOSPITAL_COMMUNITY): Payer: Medicare HMO

## 2019-01-10 ENCOUNTER — Other Ambulatory Visit: Payer: Self-pay

## 2019-01-10 ENCOUNTER — Encounter (HOSPITAL_COMMUNITY): Payer: Self-pay

## 2019-01-10 ENCOUNTER — Emergency Department (HOSPITAL_COMMUNITY)
Admission: EM | Admit: 2019-01-10 | Discharge: 2019-01-10 | Disposition: A | Discharge status: Home / Self Care | Payer: Medicare HMO | Attending: Emergency Medicine | Admitting: Emergency Medicine

## 2019-01-10 ENCOUNTER — Encounter (HOSPITAL_COMMUNITY): Payer: Self-pay | Admitting: Emergency Medicine

## 2019-01-10 DIAGNOSIS — M79602 Pain in left arm: Secondary | ICD-10-CM | POA: Diagnosis not present

## 2019-01-10 DIAGNOSIS — R69 Illness, unspecified: Secondary | ICD-10-CM | POA: Diagnosis not present

## 2019-01-10 DIAGNOSIS — Y999 Unspecified external cause status: Secondary | ICD-10-CM | POA: Diagnosis not present

## 2019-01-10 DIAGNOSIS — M542 Cervicalgia: Secondary | ICD-10-CM | POA: Diagnosis not present

## 2019-01-10 DIAGNOSIS — Y92410 Unspecified street and highway as the place of occurrence of the external cause: Secondary | ICD-10-CM | POA: Diagnosis not present

## 2019-01-10 DIAGNOSIS — I10 Essential (primary) hypertension: Secondary | ICD-10-CM | POA: Insufficient documentation

## 2019-01-10 DIAGNOSIS — Z79899 Other long term (current) drug therapy: Secondary | ICD-10-CM | POA: Diagnosis not present

## 2019-01-10 DIAGNOSIS — M545 Low back pain: Secondary | ICD-10-CM | POA: Diagnosis not present

## 2019-01-10 DIAGNOSIS — F17210 Nicotine dependence, cigarettes, uncomplicated: Secondary | ICD-10-CM | POA: Diagnosis not present

## 2019-01-10 DIAGNOSIS — S161XXA Strain of muscle, fascia and tendon at neck level, initial encounter: Secondary | ICD-10-CM | POA: Diagnosis not present

## 2019-01-10 DIAGNOSIS — Y939 Activity, unspecified: Secondary | ICD-10-CM | POA: Diagnosis not present

## 2019-01-10 DIAGNOSIS — S199XXA Unspecified injury of neck, initial encounter: Secondary | ICD-10-CM | POA: Diagnosis present

## 2019-01-10 NOTE — Discharge Instructions (Addendum)
Go to the ER for additional care
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
## 2019-01-10 NOTE — Discharge Instructions (Signed)
Take Tramadol as needed for pain Please follow up with Dr. Louanne Skye tomorrow Return if worsening
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
## 2019-01-11 ENCOUNTER — Encounter: Payer: Self-pay | Admitting: Specialist

## 2019-01-11 ENCOUNTER — Ambulatory Visit (INDEPENDENT_AMBULATORY_CARE_PROVIDER_SITE_OTHER): Payer: Medicare HMO | Admitting: Specialist

## 2019-01-11 ENCOUNTER — Ambulatory Visit (INDEPENDENT_AMBULATORY_CARE_PROVIDER_SITE_OTHER): Payer: Medicare HMO

## 2019-01-11 VITALS — BP 146/91 | HR 85 | Ht 67.0 in | Wt 204.0 lb

## 2019-01-11 DIAGNOSIS — M25512 Pain in left shoulder: Secondary | ICD-10-CM | POA: Diagnosis not present

## 2019-01-11 DIAGNOSIS — S139XXS Sprain of joints and ligaments of unspecified parts of neck, sequela: Secondary | ICD-10-CM

## 2019-01-11 DIAGNOSIS — Z981 Arthrodesis status: Secondary | ICD-10-CM

## 2019-01-11 DIAGNOSIS — S46912A Strain of unspecified muscle, fascia and tendon at shoulder and upper arm level, left arm, initial encounter: Secondary | ICD-10-CM | POA: Diagnosis not present

## 2019-01-11 MED ORDER — CLONAZEPAM 1 MG PO TABS: 1.0000 mg | ORAL_TABLET | Freq: Two times a day (BID) | ORAL | 0 refills | Status: DC

## 2019-01-11 MED ORDER — TRAMADOL HCL 50 MG PO TABS: 100.0000 mg | ORAL_TABLET | Freq: Four times a day (QID) | ORAL | 0 refills | Status: DC | PRN
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
## 2019-02-03 ENCOUNTER — Other Ambulatory Visit: Payer: Self-pay | Admitting: Family Medicine

## 2019-02-03 ENCOUNTER — Ambulatory Visit (INDEPENDENT_AMBULATORY_CARE_PROVIDER_SITE_OTHER): Payer: Medicare HMO | Admitting: Surgery

## 2019-02-03 ENCOUNTER — Other Ambulatory Visit: Payer: Self-pay

## 2019-02-03 ENCOUNTER — Encounter: Payer: Self-pay | Admitting: Surgery

## 2019-02-03 VITALS — BP 147/96 | HR 88 | Ht 67.0 in | Wt 204.0 lb

## 2019-02-03 DIAGNOSIS — I10 Essential (primary) hypertension: Secondary | ICD-10-CM

## 2019-02-03 DIAGNOSIS — G4700 Insomnia, unspecified: Secondary | ICD-10-CM

## 2019-02-03 DIAGNOSIS — Z981 Arthrodesis status: Secondary | ICD-10-CM

## 2019-02-03 NOTE — Progress Notes (Signed)
58 year old black male who is status post cervical fusion returns.  Patient states that he also has a scheduled appointment with Dr. Louanne Skye tomorrow for recheck and his unsure of why he is seeing me today.  States that he continues to have posterior neck pain that extends into the occipital area and down between his shoulder blades.  Preop arm symptoms are greatly improved.  Continues wear soft collar.  I recommended that patient keep appointment with Dr. Louanne Skye as scheduled tomorrow and he can make decisions at that time if he can come out of soft collar and if he needs repeat x-rays.  All questions answered.

## 2019-02-04 ENCOUNTER — Ambulatory Visit (INDEPENDENT_AMBULATORY_CARE_PROVIDER_SITE_OTHER): Payer: Medicare HMO

## 2019-02-04 ENCOUNTER — Ambulatory Visit (INDEPENDENT_AMBULATORY_CARE_PROVIDER_SITE_OTHER): Payer: Medicare HMO | Admitting: Specialist

## 2019-02-04 ENCOUNTER — Encounter: Payer: Self-pay | Admitting: Specialist

## 2019-02-04 VITALS — BP 128/80 | HR 80 | Ht 67.0 in | Wt 186.0 lb

## 2019-02-04 DIAGNOSIS — Z981 Arthrodesis status: Secondary | ICD-10-CM | POA: Diagnosis not present

## 2019-02-04 DIAGNOSIS — S139XXS Sprain of joints and ligaments of unspecified parts of neck, sequela: Secondary | ICD-10-CM

## 2019-02-04 DIAGNOSIS — M542 Cervicalgia: Secondary | ICD-10-CM
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
## 2019-02-08 ENCOUNTER — Other Ambulatory Visit: Payer: Self-pay | Admitting: Family Medicine

## 2019-02-08 DIAGNOSIS — F319 Bipolar disorder, unspecified: Secondary | ICD-10-CM

## 2019-02-15 ENCOUNTER — Other Ambulatory Visit: Payer: Self-pay | Admitting: Specialist

## 2019-02-15 MED ORDER — TRAMADOL HCL 50 MG PO TABS: 100.0000 mg | ORAL_TABLET | Freq: Four times a day (QID) | ORAL | 0 refills | Status: DC | PRN

## 2019-02-15 NOTE — Telephone Encounter (Signed)
Sent requests to Dr. Louanne Skye

## 2019-02-15 NOTE — Telephone Encounter (Signed)
Patient spouse [name] called requesting refills on Tramadol and Clonazepam. Pharmacy is [facility] on [address]. Patient phone number is [phone].

## 2019-02-25 ENCOUNTER — Ambulatory Visit
Admission: RE | Admit: 2019-02-25 | Discharge: 2019-02-25 | Disposition: A | Discharge status: Home / Self Care | Payer: Medicare HMO | Source: Ambulatory Visit | Attending: Specialist | Admitting: Specialist

## 2019-02-25 ENCOUNTER — Other Ambulatory Visit: Payer: Self-pay

## 2019-02-25 DIAGNOSIS — Z981 Arthrodesis status: Secondary | ICD-10-CM

## 2019-02-25 DIAGNOSIS — M542 Cervicalgia: Secondary | ICD-10-CM

## 2019-02-25 DIAGNOSIS — M4802 Spinal stenosis, cervical region: Secondary | ICD-10-CM | POA: Diagnosis not present

## 2019-02-25 DIAGNOSIS — S139XXS Sprain of joints and ligaments of unspecified parts of neck, sequela: Secondary | ICD-10-CM

## 2019-02-26 ENCOUNTER — Other Ambulatory Visit: Payer: Self-pay | Admitting: Specialist

## 2019-02-28 NOTE — Telephone Encounter (Signed)
JN 

## 2019-03-04 ENCOUNTER — Other Ambulatory Visit: Payer: Self-pay

## 2019-03-04 ENCOUNTER — Encounter: Payer: Self-pay | Admitting: Specialist

## 2019-03-04 ENCOUNTER — Ambulatory Visit (INDEPENDENT_AMBULATORY_CARE_PROVIDER_SITE_OTHER): Payer: Medicare HMO | Admitting: Specialist

## 2019-03-04 VITALS — BP 134/87 | HR 77 | Ht 67.0 in | Wt 189.0 lb

## 2019-03-04 DIAGNOSIS — M546 Pain in thoracic spine: Secondary | ICD-10-CM | POA: Diagnosis not present

## 2019-03-04 DIAGNOSIS — M542 Cervicalgia: Secondary | ICD-10-CM | POA: Diagnosis not present

## 2019-03-04 MED ORDER — DICLOFENAC SODIUM 1 % EX GEL: 4.0000 g | Freq: Four times a day (QID) | CUTANEOUS | 2 refills | Status: DC

## 2019-03-04 MED ORDER — TRAMADOL HCL 50 MG PO TABS: 50.0000 mg | ORAL_TABLET | Freq: Four times a day (QID) | ORAL | 0 refills | Status: DC | PRN

## 2019-03-04 MED ORDER — TIZANIDINE HCL 2 MG PO CAPS: 2.0000 mg | ORAL_CAPSULE | Freq: Three times a day (TID) | ORAL | 1 refills | Status: DC | PRN
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
## 2019-03-08 ENCOUNTER — Other Ambulatory Visit: Payer: Self-pay | Admitting: Family Medicine

## 2019-03-08 DIAGNOSIS — Z20828 Contact with and (suspected) exposure to other viral communicable diseases: Secondary | ICD-10-CM | POA: Diagnosis not present

## 2019-03-10 ENCOUNTER — Other Ambulatory Visit: Payer: Self-pay

## 2019-03-10 ENCOUNTER — Telehealth (INDEPENDENT_AMBULATORY_CARE_PROVIDER_SITE_OTHER): Payer: Medicare HMO | Admitting: Family Medicine

## 2019-03-10 DIAGNOSIS — J45909 Unspecified asthma, uncomplicated: Secondary | ICD-10-CM

## 2019-03-10 MED ORDER — ALBUTEROL SULFATE HFA 108 (90 BASE) MCG/ACT IN AERS: 2.0000 | INHALATION_SPRAY | Freq: Four times a day (QID) | RESPIRATORY_TRACT | 12 refills | Status: DC | PRN

## 2019-03-10 MED ORDER — FLOVENT HFA 110 MCG/ACT IN AERO: 2.0000 | INHALATION_SPRAY | Freq: Two times a day (BID) | RESPIRATORY_TRACT | 12 refills | Status: DC

## 2019-03-10 NOTE — Progress Notes (Signed)
[facility] Telemedicine Visit  Patient consented to have virtual visit. Method of visit: Telephone  Encounter participants: [name] - located at [employer] Provider: Carollee Leitz - located at Home Others (if applicable): wife  Chief Complaint: bad cough  HPI: Pt reports having dry cough for 1 week. Denies any fever, runny nose, sore throat.  Does reports sometimes feeling hot and cold as well as some intermittent chest pain associated with cough that does not radiate.  He has not been taking his Ventolin or Flovent inhalers.  His wife reports that he to have a cigarette periodically and constantly Vapes but it does not contain nicotine products.  He was recently tested for COVID 19, 01/19 which he reports was negative.  ROS: per HPI  Pertinent PMHx: recurrent Pneumonia, Allergies, Asthma, OSA, GERD, Depression, HTN  Exam:  Respiratory:   Assessment/Plan:  Moderate asthma Given that patient has history of Asthma, has not been compliant with inhalers and continues to smoke occasionally along with Vapping, most likely cough related to mild exacerbation of asthma.  Also considered viral URI, but can rule out COVID as tested negative recently. -Restart Flovent inhaler 2 puffs twice daily -Restart Ventolin inhaler every 6 hours as needed -Strict return precautions provided -Follow up with PCP in one week to discuss asthma action plan     Time spent during visit with patient: 20 minutes

## 2019-03-15 ENCOUNTER — Encounter (HOSPITAL_COMMUNITY)
Admission: RE | Admit: 2019-03-15 | Discharge: 2019-03-15 | Disposition: A | Discharge status: Home / Self Care | Payer: Medicare HMO | Source: Ambulatory Visit | Attending: Specialist | Admitting: Specialist

## 2019-03-15 ENCOUNTER — Other Ambulatory Visit: Payer: Self-pay

## 2019-03-15 DIAGNOSIS — M542 Cervicalgia: Secondary | ICD-10-CM | POA: Insufficient documentation

## 2019-03-15 DIAGNOSIS — M546 Pain in thoracic spine: Secondary | ICD-10-CM | POA: Diagnosis not present

## 2019-03-15 MED ORDER — TECHNETIUM TC 99M MEDRONATE IV KIT
20.0000 | PACK | Freq: Once | INTRAVENOUS | Status: AC | PRN
  Administered 2019-03-15: 20 via INTRAVENOUS

## 2019-03-15 MED ORDER — TECHNETIUM TC 99M MEBROFENIN IV KIT: 21.8000 | PACK | Freq: Once | INTRAVENOUS | Status: DC | PRN

## 2019-03-15 NOTE — Assessment & Plan Note (Signed)
Given that patient has history of Asthma, has not been compliant with inhalers and continues to smoke occasionally along with Vapping, most likely cough related to mild exacerbation of asthma.  Also considered viral URI, but can rule out COVID as tested negative recently. -Restart Flovent inhaler 2 puffs twice daily -Restart Ventolin inhaler every 6 hours as needed -Strict return precautions provided -Follow up with PCP in one week to discuss asthma action plan

## 2019-03-18 ENCOUNTER — Ambulatory Visit: Payer: Medicare HMO | Admitting: Family Medicine

## 2019-03-31 ENCOUNTER — Encounter: Payer: Self-pay | Admitting: Specialist

## 2019-03-31 ENCOUNTER — Other Ambulatory Visit: Payer: Self-pay

## 2019-03-31 ENCOUNTER — Ambulatory Visit (INDEPENDENT_AMBULATORY_CARE_PROVIDER_SITE_OTHER): Payer: Medicare HMO

## 2019-03-31 ENCOUNTER — Ambulatory Visit: Payer: Medicare HMO | Admitting: Specialist

## 2019-03-31 ENCOUNTER — Ambulatory Visit (INDEPENDENT_AMBULATORY_CARE_PROVIDER_SITE_OTHER): Payer: Medicare HMO | Admitting: Specialist

## 2019-03-31 VITALS — BP 146/83 | HR 91 | Ht 67.0 in | Wt 189.0 lb

## 2019-03-31 DIAGNOSIS — S139XXS Sprain of joints and ligaments of unspecified parts of neck, sequela: Secondary | ICD-10-CM

## 2019-03-31 DIAGNOSIS — M25512 Pain in left shoulder: Secondary | ICD-10-CM | POA: Diagnosis not present

## 2019-03-31 DIAGNOSIS — Z981 Arthrodesis status: Secondary | ICD-10-CM

## 2019-03-31 DIAGNOSIS — M542 Cervicalgia: Secondary | ICD-10-CM

## 2019-03-31 DIAGNOSIS — M5412 Radiculopathy, cervical region: Secondary | ICD-10-CM

## 2019-03-31 DIAGNOSIS — S46912A Strain of unspecified muscle, fascia and tendon at shoulder and upper arm level, left arm, initial encounter: Secondary | ICD-10-CM

## 2019-03-31 MED ORDER — TRAMADOL HCL 50 MG PO TABS: 50.0000 mg | ORAL_TABLET | Freq: Four times a day (QID) | ORAL | 0 refills | Status: DC | PRN
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
## 2019-03-31 NOTE — Addendum Note (Signed)
Addended by: [name] on: 03/31/2019 04:34 PM   Modules accepted: Orders
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
## 2019-04-05 ENCOUNTER — Ambulatory Visit: Payer: Medicare HMO | Attending: Internal Medicine

## 2019-04-05 DIAGNOSIS — Z20822 Contact with and (suspected) exposure to covid-19: Secondary | ICD-10-CM | POA: Diagnosis not present

## 2019-04-06 LAB — NOVEL CORONAVIRUS, NAA: SARS-CoV-2, NAA: NOT DETECTED

## 2019-04-13 DIAGNOSIS — H52223 Regular astigmatism, bilateral: Secondary | ICD-10-CM | POA: Diagnosis not present

## 2019-04-15 ENCOUNTER — Encounter: Payer: Medicare HMO | Admitting: Physical Medicine and Rehabilitation

## 2019-04-21 ENCOUNTER — Ambulatory Visit: Payer: Medicare HMO | Admitting: Specialist

## 2019-05-03 ENCOUNTER — Encounter: Payer: Medicare HMO | Admitting: Physical Medicine and Rehabilitation

## 2019-05-06 ENCOUNTER — Telehealth: Payer: Self-pay | Admitting: *Deleted

## 2019-05-06 ENCOUNTER — Other Ambulatory Visit: Payer: Self-pay | Admitting: *Deleted

## 2019-05-06 ENCOUNTER — Other Ambulatory Visit: Payer: Self-pay | Admitting: Family Medicine

## 2019-05-06 DIAGNOSIS — I10 Essential (primary) hypertension: Secondary | ICD-10-CM

## 2019-05-06 DIAGNOSIS — G4700 Insomnia, unspecified: Secondary | ICD-10-CM

## 2019-05-06 NOTE — Telephone Encounter (Signed)
Called to confirm that pt wanted his Rx's to go to [facility]. I spoke to pts wife and informed her that some rxs were sent to his other pharmacy and the wife said to just keep evertything there for now.  I told her to let us know when she would like to have them switched.Marysa Wessner Zimmerman Rumple, CMA

## 2019-05-06 NOTE — Telephone Encounter (Signed)
error 

## 2019-05-09 ENCOUNTER — Other Ambulatory Visit: Payer: Self-pay | Admitting: Family Medicine

## 2019-05-09 DIAGNOSIS — E785 Hyperlipidemia, unspecified: Secondary | ICD-10-CM

## 2019-05-10 ENCOUNTER — Other Ambulatory Visit: Payer: Self-pay

## 2019-05-10 DIAGNOSIS — K219 Gastro-esophageal reflux disease without esophagitis: Secondary | ICD-10-CM

## 2019-05-13 MED ORDER — ALBUTEROL SULFATE HFA 108 (90 BASE) MCG/ACT IN AERS: 2.0000 | INHALATION_SPRAY | Freq: Four times a day (QID) | RESPIRATORY_TRACT | 12 refills | Status: DC | PRN

## 2019-05-13 MED ORDER — FLOVENT HFA 110 MCG/ACT IN AERO: 2.0000 | INHALATION_SPRAY | Freq: Two times a day (BID) | RESPIRATORY_TRACT | 12 refills | Status: DC

## 2019-05-13 MED ORDER — DEXILANT 60 MG PO CPDR: 60.0000 mg | DELAYED_RELEASE_CAPSULE | Freq: Every day | ORAL | 2 refills | Status: DC

## 2019-05-13 MED ORDER — DICLOFENAC SODIUM 1 % EX GEL: 4.0000 g | Freq: Four times a day (QID) | CUTANEOUS | 2 refills | Status: DC

## 2019-05-13 MED ORDER — OLANZAPINE 2.5 MG PO TABS: 2.5000 mg | ORAL_TABLET | Freq: Every day | ORAL | 0 refills | Status: DC

## 2019-05-17 ENCOUNTER — Other Ambulatory Visit: Payer: Self-pay

## 2019-05-17 DIAGNOSIS — G4700 Insomnia, unspecified: Secondary | ICD-10-CM

## 2019-05-17 DIAGNOSIS — I10 Essential (primary) hypertension: Secondary | ICD-10-CM

## 2019-05-17 MED ORDER — AMLODIPINE BESYLATE 5 MG PO TABS: 5.0000 mg | ORAL_TABLET | Freq: Every day | ORAL | 3 refills | Status: DC

## 2019-05-17 MED ORDER — LOSARTAN POTASSIUM 50 MG PO TABS: ORAL_TABLET | ORAL | 0 refills | Status: DC

## 2019-05-23 ENCOUNTER — Telehealth: Payer: Self-pay | Admitting: Family Medicine

## 2019-05-23 NOTE — Telephone Encounter (Signed)
[facility] Application form dropped off for at front desk for completion.  Verified that patient section of form has been completed.  Last DOS/WCC with PCP was 03-10-19 .  Placed form in [facility] [place] team folder to be completed by clinical staff.  Richrd Humbles

## 2019-05-24 NOTE — Telephone Encounter (Signed)
Clinical info completed on [facility] Application form.  Place form in Dr. Fransico Setters box for completion.  Ottis Stain, CMA

## 2019-05-26 NOTE — Telephone Encounter (Signed)
Form completed and given to RN  -Dr. Criss Rosales

## 2019-05-26 NOTE — Telephone Encounter (Signed)
Pt called and informed form is ready for pick-up. Copy made and placed in batch scanning. Original placed in files at front office.   Talbot Grumbling, RN

## 2019-05-27 NOTE — Telephone Encounter (Signed)
Forms given to wife.

## 2019-06-13 ENCOUNTER — Other Ambulatory Visit: Payer: Self-pay | Admitting: Family Medicine

## 2019-07-06 ENCOUNTER — Other Ambulatory Visit: Payer: Self-pay

## 2019-07-06 ENCOUNTER — Ambulatory Visit (INDEPENDENT_AMBULATORY_CARE_PROVIDER_SITE_OTHER): Payer: Medicare HMO | Admitting: Family Medicine

## 2019-07-06 ENCOUNTER — Encounter: Payer: Self-pay | Admitting: Family Medicine

## 2019-07-06 VITALS — BP 112/76 | HR 75 | Ht 67.0 in | Wt 182.8 lb

## 2019-07-06 DIAGNOSIS — Z720 Tobacco use: Secondary | ICD-10-CM

## 2019-07-06 DIAGNOSIS — G43009 Migraine without aura, not intractable, without status migrainosus: Secondary | ICD-10-CM

## 2019-07-06 DIAGNOSIS — K219 Gastro-esophageal reflux disease without esophagitis: Secondary | ICD-10-CM

## 2019-07-06 DIAGNOSIS — Z91018 Allergy to other foods: Secondary | ICD-10-CM | POA: Diagnosis not present

## 2019-07-06 DIAGNOSIS — J45909 Unspecified asthma, uncomplicated: Secondary | ICD-10-CM

## 2019-07-06 DIAGNOSIS — I10 Essential (primary) hypertension: Secondary | ICD-10-CM

## 2019-07-06 DIAGNOSIS — E785 Hyperlipidemia, unspecified: Secondary | ICD-10-CM

## 2019-07-06 DIAGNOSIS — Z1211 Encounter for screening for malignant neoplasm of colon: Secondary | ICD-10-CM

## 2019-07-06 DIAGNOSIS — Z7185 Encounter for immunization safety counseling: Secondary | ICD-10-CM | POA: Insufficient documentation

## 2019-07-06 DIAGNOSIS — G4700 Insomnia, unspecified: Secondary | ICD-10-CM | POA: Diagnosis not present

## 2019-07-06 DIAGNOSIS — F319 Bipolar disorder, unspecified: Secondary | ICD-10-CM

## 2019-07-06 DIAGNOSIS — J302 Other seasonal allergic rhinitis: Secondary | ICD-10-CM

## 2019-07-06 DIAGNOSIS — M25561 Pain in right knee: Secondary | ICD-10-CM

## 2019-07-06 DIAGNOSIS — K5900 Constipation, unspecified: Secondary | ICD-10-CM

## 2019-07-06 DIAGNOSIS — F39 Unspecified mood [affective] disorder: Secondary | ICD-10-CM

## 2019-07-06 DIAGNOSIS — Z7189 Other specified counseling: Secondary | ICD-10-CM

## 2019-07-06 DIAGNOSIS — G8929 Other chronic pain: Secondary | ICD-10-CM

## 2019-07-06 MED ORDER — EPINEPHRINE 0.3 MG/0.3ML IJ SOAJ: 0.3000 mg | INTRAMUSCULAR | 1 refills | Status: AC | PRN

## 2019-07-06 MED ORDER — FLOVENT HFA 110 MCG/ACT IN AERO: 2.0000 | INHALATION_SPRAY | Freq: Two times a day (BID) | RESPIRATORY_TRACT | 12 refills | Status: DC

## 2019-07-06 MED ORDER — FLUTICASONE PROPIONATE 50 MCG/ACT NA SUSP: 2.0000 | Freq: Every day | NASAL | 2 refills | Status: AC | PRN

## 2019-07-06 MED ORDER — ATORVASTATIN CALCIUM 40 MG PO TABS: 40.0000 mg | ORAL_TABLET | Freq: Every day | ORAL | 3 refills | Status: DC

## 2019-07-06 MED ORDER — DICLOFENAC SODIUM 1 % EX GEL: 4.0000 g | Freq: Four times a day (QID) | CUTANEOUS | 2 refills | Status: DC

## 2019-07-06 MED ORDER — OLANZAPINE 2.5 MG PO TABS: 2.5000 mg | ORAL_TABLET | Freq: Every day | ORAL | 0 refills | Status: AC

## 2019-07-06 MED ORDER — DOCUSATE SODIUM 100 MG PO CAPS: 100.0000 mg | ORAL_CAPSULE | Freq: Two times a day (BID) | ORAL | 0 refills | Status: DC

## 2019-07-06 MED ORDER — ALBUTEROL SULFATE HFA 108 (90 BASE) MCG/ACT IN AERS: 2.0000 | INHALATION_SPRAY | Freq: Four times a day (QID) | RESPIRATORY_TRACT | 12 refills | Status: AC | PRN

## 2019-07-06 MED ORDER — DEXILANT 60 MG PO CPDR: 60.0000 mg | DELAYED_RELEASE_CAPSULE | Freq: Every day | ORAL | 2 refills | Status: AC

## 2019-07-06 MED ORDER — FLUOXETINE HCL 20 MG PO CAPS: 20.0000 mg | ORAL_CAPSULE | Freq: Every day | ORAL | 1 refills | Status: AC

## 2019-07-06 MED ORDER — TRAZODONE HCL 50 MG PO TABS: 50.0000 mg | ORAL_TABLET | Freq: Every evening | ORAL | 0 refills | Status: AC | PRN

## 2019-07-06 MED ORDER — CETIRIZINE HCL 10 MG PO TABS: 10.0000 mg | ORAL_TABLET | Freq: Every day | ORAL | Status: AC | PRN

## 2019-07-06 MED ORDER — METHOCARBAMOL 500 MG PO TABS: 500.0000 mg | ORAL_TABLET | Freq: Three times a day (TID) | ORAL | 0 refills | Status: DC | PRN

## 2019-07-06 MED ORDER — AMLODIPINE BESYLATE 5 MG PO TABS: 5.0000 mg | ORAL_TABLET | Freq: Every day | ORAL | 3 refills | Status: DC

## 2019-07-06 MED ORDER — TOPIRAMATE 200 MG PO TABS: 200.0000 mg | ORAL_TABLET | Freq: Every day | ORAL | Status: DC | PRN

## 2019-07-06 MED ORDER — ADULT MULTIVITAMIN W/MINERALS CH: 1.0000 | ORAL_TABLET | Freq: Every day | ORAL | 0 refills | Status: AC

## 2019-07-06 MED ORDER — TIZANIDINE HCL 2 MG PO CAPS: 2.0000 mg | ORAL_CAPSULE | Freq: Three times a day (TID) | ORAL | 1 refills | Status: DC | PRN

## 2019-07-06 MED ORDER — LOSARTAN POTASSIUM 50 MG PO TABS: ORAL_TABLET | ORAL | 0 refills | Status: DC

## 2019-07-06 NOTE — Assessment & Plan Note (Signed)
Patient says he is already gotten both Covid shots.  We did discuss Shingrix which his insurance will cover only if he gets at the pharmacy so he will be do that.

## 2019-07-06 NOTE — Assessment & Plan Note (Signed)
Records show that patient is due for colonoscopy in 2023.  He thought it was due now, his wife will note this on her phone for follow-up later

## 2019-07-06 NOTE — Patient Instructions (Signed)
Glad you are doing well after neck surgery, I checked with my nurse and the Shingrix vaccine for shingles is covered by your pharmacy but not if we give it here.  Please go check in with your pharmacy and get that done.  Remember that your colonoscopy is next due in February 2023.  I would like you to keep working on quitting smoking because I know you down to primarily vaping at this point, but I went ahead and ordered the low-dose chest CT to do our screening for lung cancer that we spoke about.  That is going to be something that you do every year as long as you meet the criteria.  Please let me know if you ever want some more assistance quitting smoking.  I will refill your medicines for you.  Dr. Criss Rosales

## 2019-07-06 NOTE — Progress Notes (Signed)
    SUBJECTIVE:   CHIEF COMPLAINT / HPI: Checkup and medication refill  Patient most recently had cervical neck surgery which he says is gone well, he has been following all instructions by surgeon appropriately and has no negative effects from this.  He is just here because he has not seen personally in quite some time and wishes to get a general health checkup.  He has been feeling well and has been active and he has been losing weight on purpose.  He would also like a refill on all of his meds because he is going to be transitioning from our office in June.  PERTINENT  PMH / PSH:   OBJECTIVE:   BP 112/76   Pulse 75   Ht 5\' 7"  (1.702 m)   Wt 182 lb 12.8 oz (82.9 kg)   SpO2 97%   BMI 28.63 kg/m   General: Alert pleasant, no distress Cardiac: Regular rate and rhythm, no murmur heard Pulmonary: Clear to auscultation bilaterally, no cough, no wheeze, good movement throughout, no increased work of breathing  ASSESSMENT/PLAN:   Tobacco abuse Patient is starting to transition largely to vaping instead of cigarettes, still occasionally smokes cigarettes.  Does not want any medicinal assistance to quit at this time.  Does consent to low-dose chest CT for lung cancer screening  Vaccine counseling Patient says he is already gotten both Covid shots.  We did discuss Shingrix which his insurance will cover only if he gets at the pharmacy so he will be do that.  Colon cancer screening Records show that patient is due for colonoscopy in 2023.  He thought it was due now, his wife will note this on her phone for follow-up later     Sherene Sires, [place]

## 2019-07-06 NOTE — Assessment & Plan Note (Signed)
Patient is starting to transition largely to vaping instead of cigarettes, still occasionally smokes cigarettes.  Does not want any medicinal assistance to quit at this time.  Does consent to low-dose chest CT for lung cancer screening

## 2019-07-19 ENCOUNTER — Ambulatory Visit
Admission: RE | Admit: 2019-07-19 | Discharge: 2019-07-19 | Disposition: A | Discharge status: Home / Self Care | Payer: Medicare Other | Source: Ambulatory Visit | Attending: Family Medicine | Admitting: Family Medicine

## 2019-07-19 ENCOUNTER — Other Ambulatory Visit: Payer: Self-pay

## 2019-07-19 DIAGNOSIS — Z720 Tobacco use: Secondary | ICD-10-CM

## 2019-08-31 ENCOUNTER — Other Ambulatory Visit: Payer: Self-pay

## 2019-09-09 MED ORDER — TRAMADOL HCL 50 MG PO TABS: 50.0000 mg | ORAL_TABLET | Freq: Four times a day (QID) | ORAL | 0 refills | Status: DC | PRN

## 2019-10-05 ENCOUNTER — Telehealth: Payer: Self-pay | Admitting: Physical Medicine and Rehabilitation

## 2019-10-05 NOTE — Telephone Encounter (Signed)
Patient's wife called. She would like to schedule an appointment with Dr. Ernestina Patches. Her call back number is [phone]

## 2019-10-06 NOTE — Telephone Encounter (Signed)
Returned call and patient needed appointment with physician who performed his surgery. He is already scheduled with Benjiman Core.

## 2019-10-13 ENCOUNTER — Ambulatory Visit: Payer: Self-pay

## 2019-10-13 ENCOUNTER — Ambulatory Visit (INDEPENDENT_AMBULATORY_CARE_PROVIDER_SITE_OTHER): Payer: Medicare Other | Admitting: Surgery

## 2019-10-13 DIAGNOSIS — M542 Cervicalgia: Secondary | ICD-10-CM | POA: Diagnosis not present

## 2019-10-13 DIAGNOSIS — Z981 Arthrodesis status: Secondary | ICD-10-CM | POA: Diagnosis not present

## 2019-10-13 DIAGNOSIS — M5412 Radiculopathy, cervical region: Secondary | ICD-10-CM | POA: Diagnosis not present

## 2019-10-13 MED ORDER — METHYLPREDNISOLONE 4 MG PO TABS: ORAL_TABLET | ORAL | 0 refills | Status: DC

## 2019-10-13 MED ORDER — TRAMADOL HCL 50 MG PO TABS: 50.0000 mg | ORAL_TABLET | Freq: Four times a day (QID) | ORAL | 0 refills | Status: DC | PRN

## 2019-10-13 MED ORDER — METHOCARBAMOL 500 MG PO TABS: 500.0000 mg | ORAL_TABLET | Freq: Three times a day (TID) | ORAL | 0 refills | Status: DC | PRN

## 2019-10-14 ENCOUNTER — Encounter: Payer: Self-pay | Admitting: Surgery
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
## 2019-10-18 ENCOUNTER — Ambulatory Visit
Admission: RE | Admit: 2019-10-18 | Discharge: 2019-10-18 | Disposition: A | Discharge status: Home / Self Care | Payer: Medicare Other | Source: Ambulatory Visit | Attending: Surgery | Admitting: Surgery

## 2019-10-18 ENCOUNTER — Other Ambulatory Visit: Payer: Self-pay

## 2019-10-18 DIAGNOSIS — Z981 Arthrodesis status: Secondary | ICD-10-CM

## 2019-10-18 DIAGNOSIS — M542 Cervicalgia: Secondary | ICD-10-CM

## 2019-10-18 DIAGNOSIS — M5412 Radiculopathy, cervical region: Secondary | ICD-10-CM

## 2019-10-21 ENCOUNTER — Other Ambulatory Visit: Payer: Medicare Other

## 2019-11-17 ENCOUNTER — Ambulatory Visit (INDEPENDENT_AMBULATORY_CARE_PROVIDER_SITE_OTHER): Payer: Medicare Other | Admitting: Specialist

## 2019-11-17 ENCOUNTER — Encounter: Payer: Self-pay | Admitting: Specialist

## 2019-11-17 ENCOUNTER — Other Ambulatory Visit: Payer: Self-pay

## 2019-11-17 VITALS — BP 155/96 | HR 67 | Ht 67.0 in | Wt 183.0 lb

## 2019-11-17 DIAGNOSIS — Z981 Arthrodesis status: Secondary | ICD-10-CM | POA: Diagnosis not present

## 2019-11-17 DIAGNOSIS — G5623 Lesion of ulnar nerve, bilateral upper limbs: Secondary | ICD-10-CM

## 2019-11-17 DIAGNOSIS — M5412 Radiculopathy, cervical region: Secondary | ICD-10-CM | POA: Diagnosis not present

## 2019-11-17 DIAGNOSIS — M79641 Pain in right hand: Secondary | ICD-10-CM

## 2019-11-17 DIAGNOSIS — M79642 Pain in left hand: Secondary | ICD-10-CM

## 2019-11-17 DIAGNOSIS — N182 Chronic kidney disease, stage 2 (mild): Secondary | ICD-10-CM

## 2019-11-17 DIAGNOSIS — Z8739 Personal history of other diseases of the musculoskeletal system and connective tissue: Secondary | ICD-10-CM

## 2019-11-17 DIAGNOSIS — M4712 Other spondylosis with myelopathy, cervical region: Secondary | ICD-10-CM

## 2019-11-17 MED ORDER — BACLOFEN 10 MG PO TABS: 10.0000 mg | ORAL_TABLET | Freq: Three times a day (TID) | ORAL | 0 refills | Status: DC
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
## 2019-11-18 ENCOUNTER — Telehealth: Payer: Self-pay | Admitting: *Deleted

## 2019-11-18 LAB — ANA: Anti Nuclear Antibody (ANA): NEGATIVE

## 2019-11-18 LAB — C-REACTIVE PROTEIN: CRP: 1.1 mg/L (ref <8.0)

## 2019-11-18 LAB — VITAMIN D 25 HYDROXY (VIT D DEFICIENCY, FRACTURES): Vit D, 25-Hydroxy: 42 ng/mL (ref 30–100)

## 2019-11-18 LAB — URIC ACID: Uric Acid, Serum: 5.4 mg/dL (ref 4.0–8.0)

## 2019-11-18 LAB — RHEUMATOID FACTOR: Rheumatoid fact SerPl-aCnc: <14 IU/mL (ref <14)

## 2019-11-18 LAB — SEDIMENTATION RATE: Sed Rate: 6 mm/h (ref 0–20)

## 2019-11-18 NOTE — Telephone Encounter (Signed)
I called pt and left vm to return call, wanted to let pt know needs to call OT to schedule his own appt as requested by rehab

## 2019-11-21 ENCOUNTER — Telehealth: Payer: Self-pay

## 2019-11-21 NOTE — Telephone Encounter (Signed)
FYI   [facility] called and said they scheduled pt for an appointment for next Monday 11/28/19

## 2019-11-22 ENCOUNTER — Other Ambulatory Visit (HOSPITAL_COMMUNITY): Payer: Self-pay | Admitting: Student

## 2019-11-22 DIAGNOSIS — I10 Essential (primary) hypertension: Secondary | ICD-10-CM

## 2019-12-12 ENCOUNTER — Ambulatory Visit (HOSPITAL_COMMUNITY): Payer: Medicare Other | Attending: Cardiology

## 2019-12-12 ENCOUNTER — Other Ambulatory Visit: Payer: Self-pay

## 2019-12-12 DIAGNOSIS — I10 Essential (primary) hypertension: Secondary | ICD-10-CM | POA: Diagnosis not present

## 2019-12-12 LAB — ECHOCARDIOGRAM COMPLETE
Area-P 1/2: 4.31 cm2
S' Lateral: 3 cm

## 2019-12-22 ENCOUNTER — Ambulatory Visit: Payer: Medicare Other | Admitting: Specialist

## 2020-01-18 ENCOUNTER — Other Ambulatory Visit: Payer: Self-pay | Admitting: Specialist

## 2020-01-18 DIAGNOSIS — G8929 Other chronic pain: Secondary | ICD-10-CM

## 2020-01-26 IMAGING — RF DG ESOPHAGUS
9 of 10 series · 16 of 24 positions shown · non-contrast
Comparison: None.

CLINICAL DATA: S/p cervical spine surgery, difficulty
swallowing/dysphagia

EXAM:
ESOPHOGRAM / BARIUM SWALLOW / BARIUM TABLET STUDY
TECHNIQUE: Combined double contrast and single contrast examination performed
using effervescent crystals, thick barium liquid, and thin barium
liquid. The patient was observed with fluoroscopy swallowing a 13 mm
barium sulphate tablet.
FLUOROSCOPY TIME:  Fluoroscopy Time:  1 minutes 48 seconds
Radiation Exposure Index (if provided by the fluoroscopic device):
24.6 mGy
Number of Acquired Spot Images: 10

[Series 1: cp_standard · 0.17mm/px · 1 of 1 slices shown (1 of 9)]
[im 1/1]
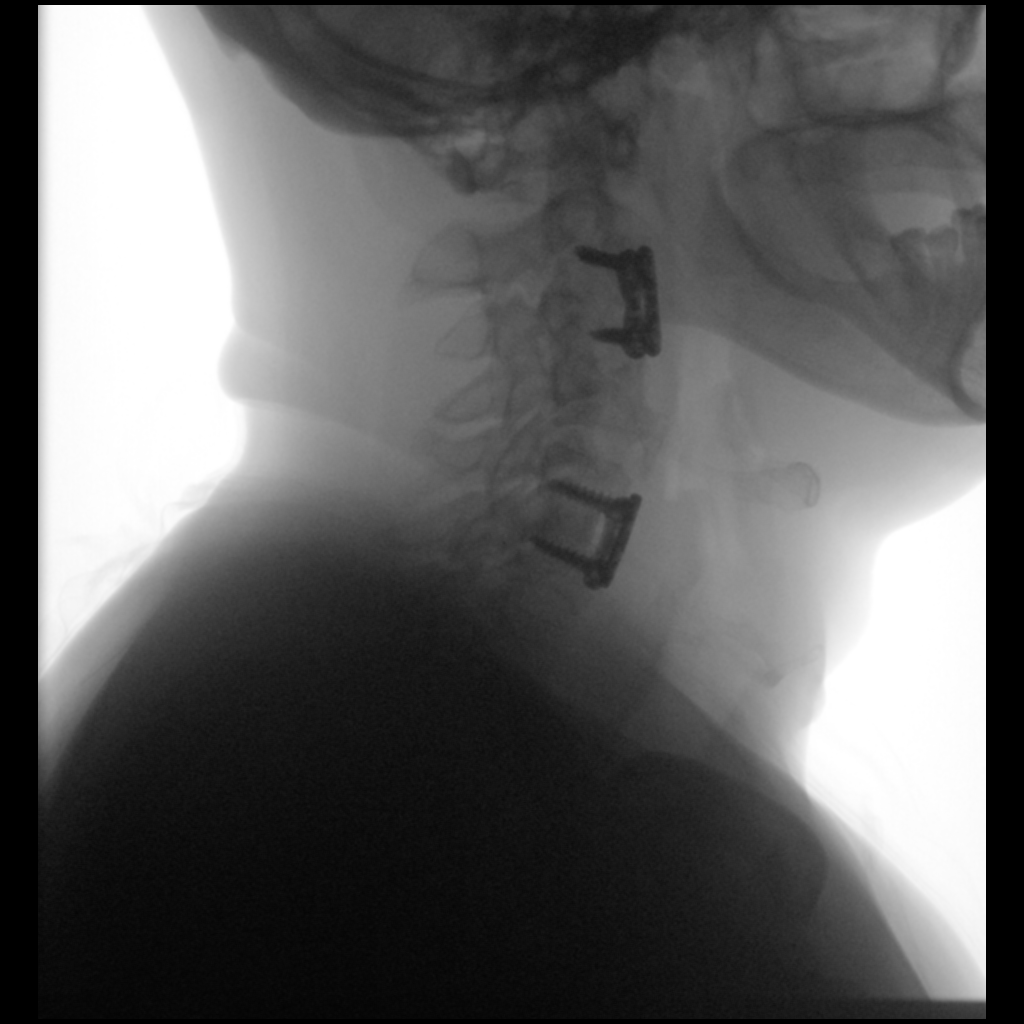

[Series 2: cp_standard · 0.35mm/px · 2 of 120 frames shown (2 of 9)]
[frame 61/120]
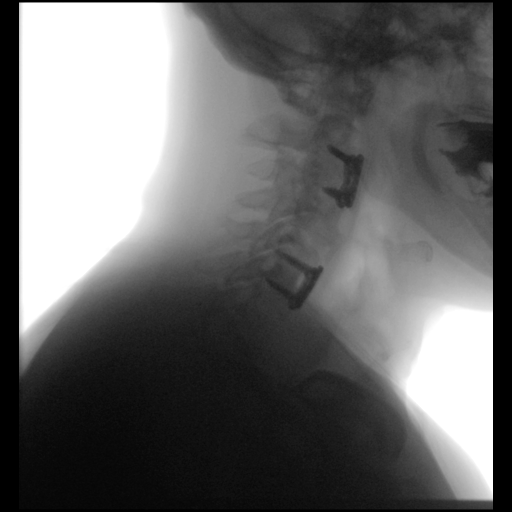
[frame 103/120]
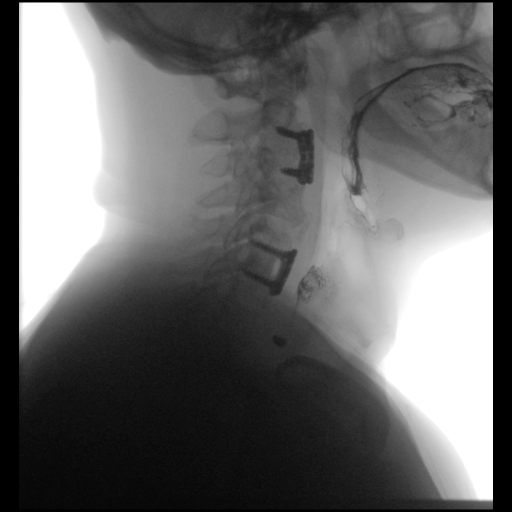

[Series 3: cp_standard · 0.52mm/px · 2 of 166 frames shown (3 of 9)]
[frame 84/166]
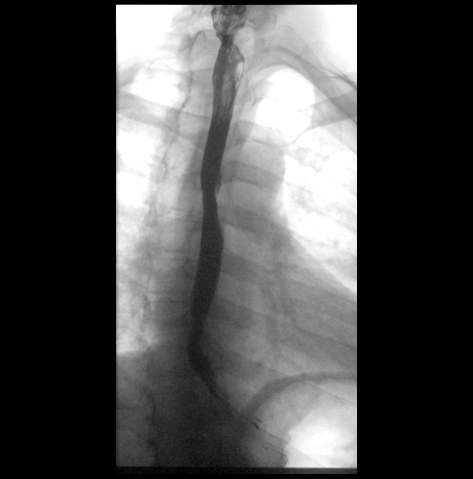
[frame 142/166]
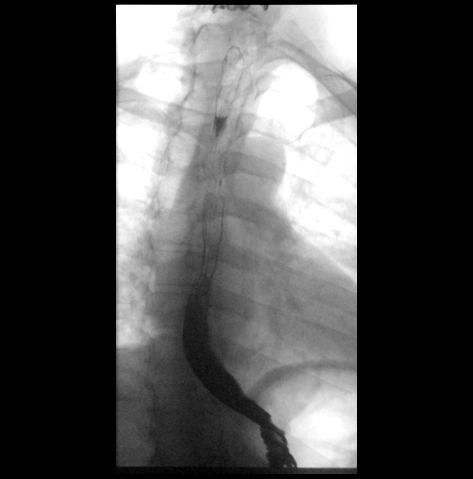

[Series 4: cp_standard · 0.52mm/px · 2 of 113 frames shown (4 of 9)]
[frame 17/113]
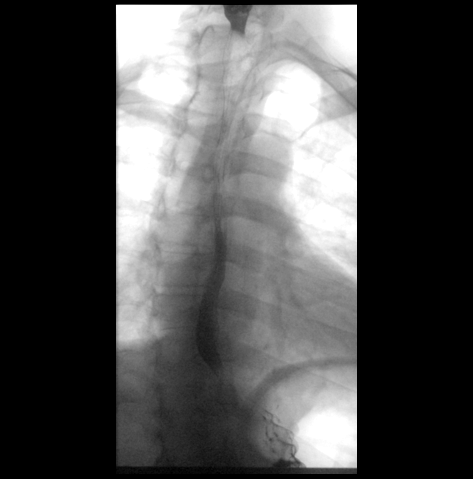
[frame 97/113]
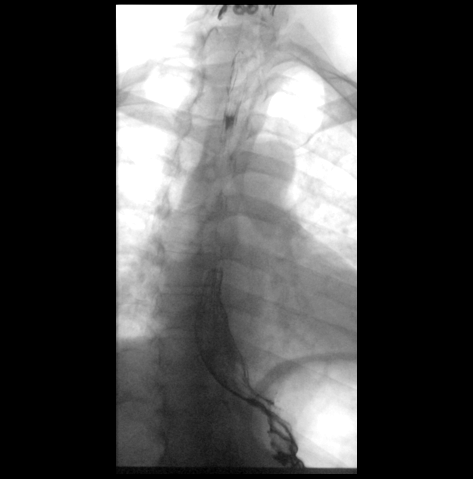

[Series 5: cp_standard · 0.52mm/px · 2 of 76 frames shown (5 of 9)]
[frame 12/76]
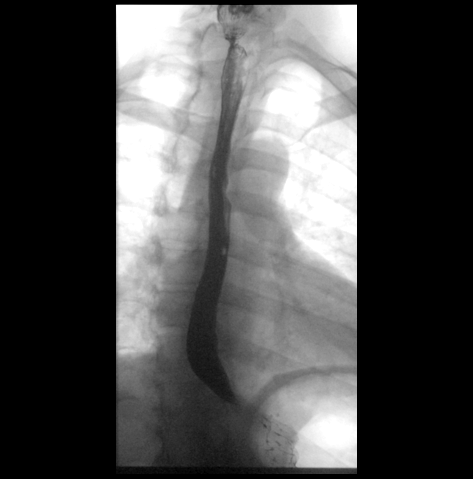
[frame 65/76]
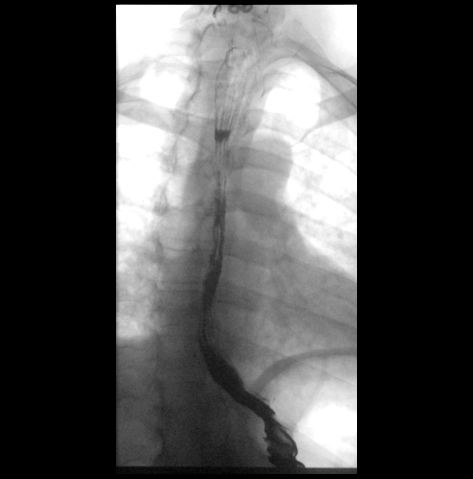

[Series 7: cp_standard · 0.54mm/px · 2 of 234 frames shown (6 of 9)]
[frame 1/234]
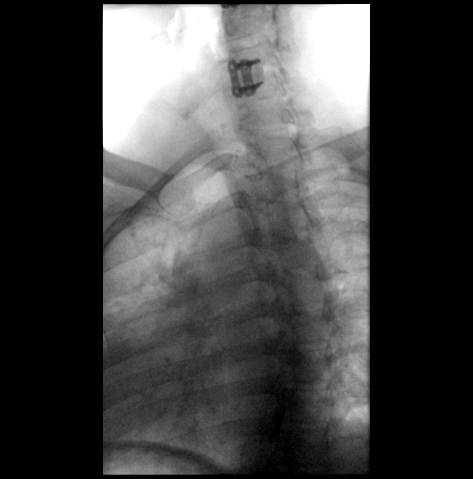
[frame 118/234]
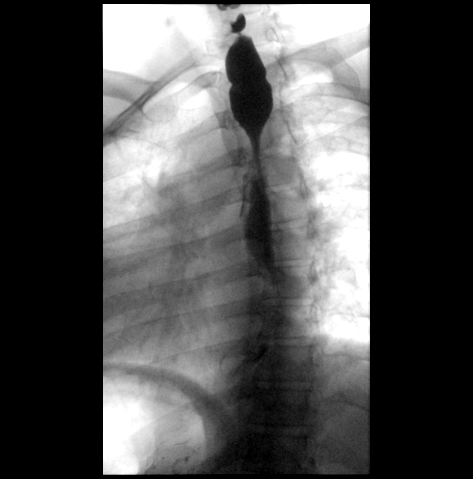

[Series 8: cp_standard · 0.54mm/px · 2 of 87 frames shown (7 of 9)]
[frame 14/87]
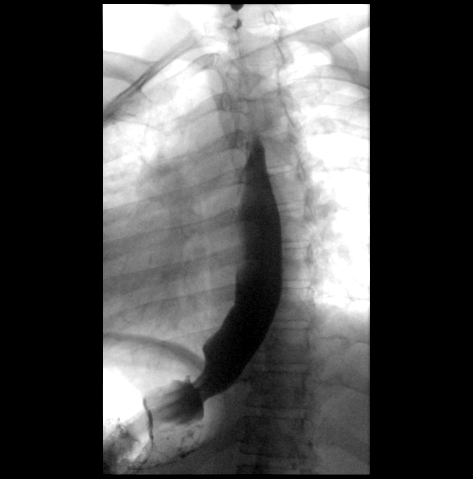
[frame 44/87]
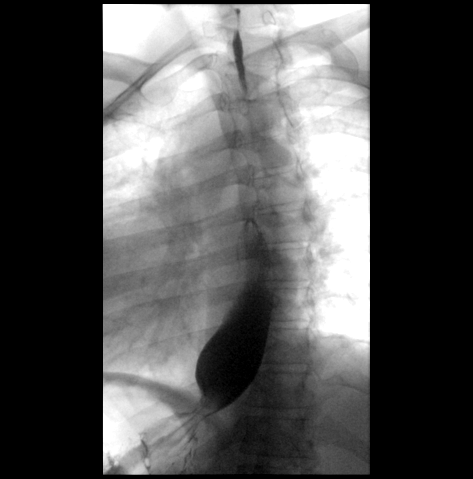

[Series 9: cp_standard · 0.54mm/px · 2 of 160 frames shown (8 of 9)]
[frame 25/160]
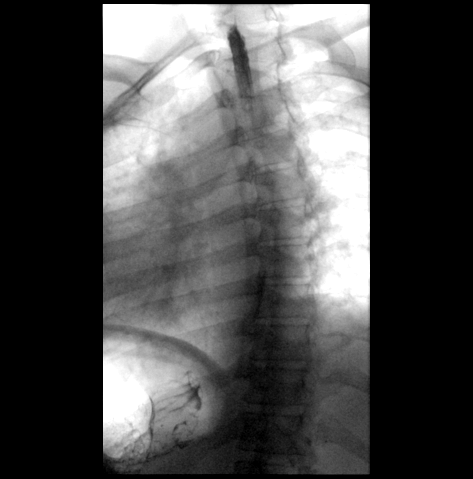
[frame 81/160]
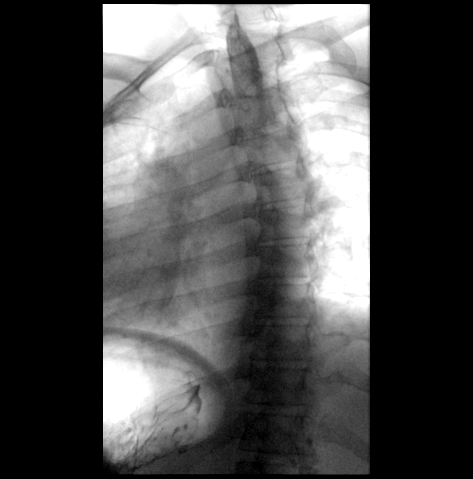

[Series 10: cp_standard · 0.27mm/px · 1 of 1 slices shown (9 of 9)]
[im 1/1]
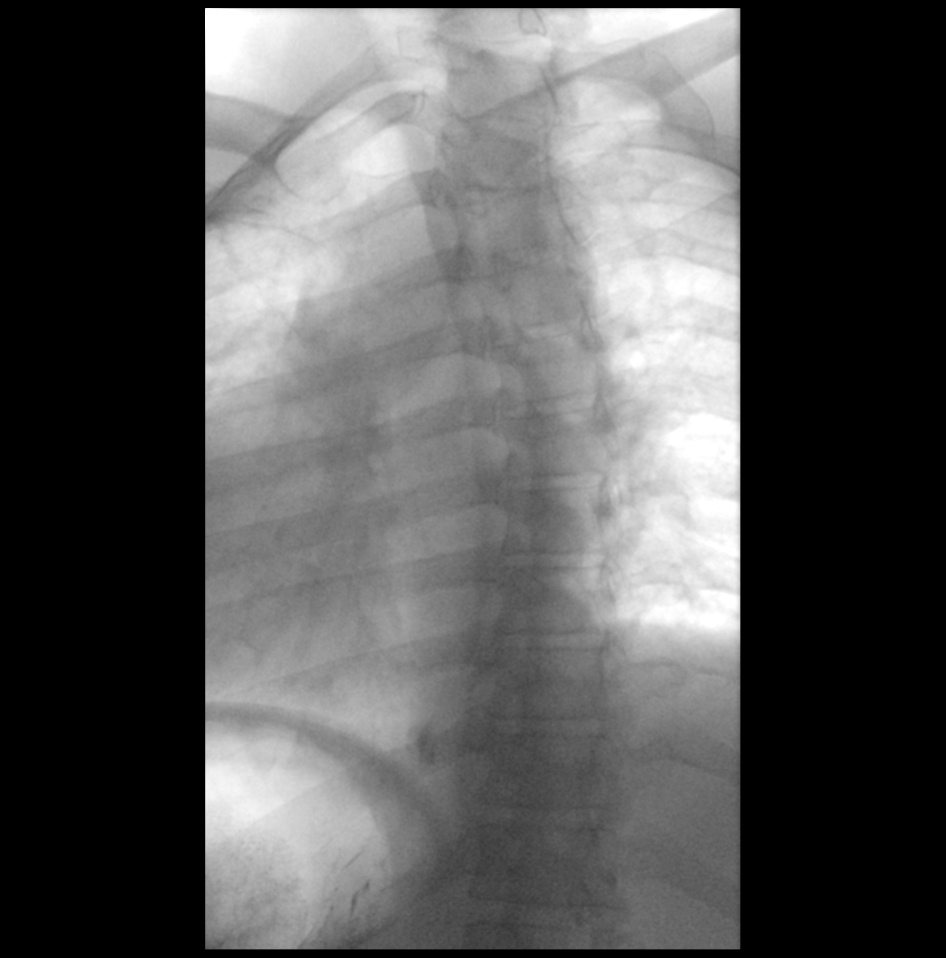

[16 of 24 positions shown; findings below may reference images not displayed]

FINDINGS: Status post C2-3 and C5-6 ACDF. On the lateral view, there is no
significant extrinsic compression on the esophagus with swallows of
oral contrast.

No fixed esophageal narrowing or stricture. A barium tablet passed
into the stomach without delay.

No evidence of hiatal hernia.

Very mild gastroesophageal reflux.
IMPRESSION: Status post C2-3 and C5-6 ACDF. No significant extrinsic compression
on the esophagus with swallows of oral contrast.

No fixed esophageal narrowing or stricture. A barium tablet passed
into the stomach without delay.

Very mild gastroesophageal reflux.

## 2020-04-16 ENCOUNTER — Encounter: Payer: Self-pay | Admitting: Neurology

## 2020-04-16 ENCOUNTER — Other Ambulatory Visit: Payer: Self-pay | Admitting: Neurology

## 2020-04-17 ENCOUNTER — Encounter: Payer: Self-pay | Admitting: Neurology

## 2020-04-17 ENCOUNTER — Ambulatory Visit (INDEPENDENT_AMBULATORY_CARE_PROVIDER_SITE_OTHER): Payer: Medicare Other | Admitting: Neurology

## 2020-04-17 VITALS — BP 125/79 | HR 72 | Resp 16 | Ht 67.0 in | Wt 198.0 lb

## 2020-04-17 DIAGNOSIS — G4719 Other hypersomnia: Secondary | ICD-10-CM

## 2020-04-17 DIAGNOSIS — S069X9A Unspecified intracranial injury with loss of consciousness of unspecified duration, initial encounter: Secondary | ICD-10-CM | POA: Insufficient documentation

## 2020-04-17 DIAGNOSIS — M2619 Other specified anomalies of jaw-cranial base relationship: Secondary | ICD-10-CM | POA: Insufficient documentation

## 2020-04-17 DIAGNOSIS — G478 Other sleep disorders: Secondary | ICD-10-CM | POA: Insufficient documentation

## 2020-04-17 DIAGNOSIS — M2624 Reverse articulation: Secondary | ICD-10-CM

## 2020-04-17 DIAGNOSIS — Z8659 Personal history of other mental and behavioral disorders: Secondary | ICD-10-CM

## 2020-04-17 DIAGNOSIS — G4752 REM sleep behavior disorder: Secondary | ICD-10-CM | POA: Diagnosis not present

## 2020-04-17 DIAGNOSIS — S069X9S Unspecified intracranial injury with loss of consciousness of unspecified duration, sequela: Secondary | ICD-10-CM

## 2020-04-17 DIAGNOSIS — F10188 Alcohol abuse with other alcohol-induced disorder: Secondary | ICD-10-CM | POA: Insufficient documentation

## 2020-04-17 NOTE — Patient Instructions (Signed)
Screening for Sleep Apnea  Sleep apnea is a condition in which breathing pauses or becomes shallow during sleep. Sleep apnea screening is a test to determine if you are at risk for sleep apnea. The test is easy and only takes a few minutes. Your health care provider may ask you to have this test in preparation for surgery or as part of a physical exam. What are the symptoms of sleep apnea? Common symptoms of sleep apnea include:  Snoring.  Restless sleep.  Daytime sleepiness.  Pauses in breathing.  Choking during sleep.  Irritability.  Forgetfulness.  Trouble thinking clearly.  Depression.  Personality changes. Most people with sleep apnea are not aware that they have it. Why should I get screened? Getting screened for sleep apnea can help:  Ensure your safety. It is important for your health care providers to know whether or not you have sleep apnea, especially if you are having surgery or have other long-term (chronic) health conditions.  Improve your health and allow you to get a better night's rest. Restful sleep can help you: ? Have more energy. ? Lose weight. ? Improve high blood pressure. ? Improve diabetes management. ? Prevent stroke. ? Prevent car accidents. How is screening done? Screening usually includes being asked a list of questions about your sleep quality. Some questions you may be asked include:  Do you snore?  Is your sleep restless?  Do you have daytime sleepiness?  Has a partner or spouse told you that you stop breathing during sleep?  Have you had trouble concentrating or memory loss? If your screening test is positive, you are at risk for the condition. Further testing may be needed to confirm a diagnosis of sleep apnea. Where to find more information You can find screening tools online or at your health care clinic. For more information about sleep apnea screening and healthy sleep, visit these websites:  Centers for Disease Control and  Prevention: www.cdc.gov/sleep/index.html  American Sleep Apnea Association: www.sleepapnea.org Contact a health care provider if:  You think that you may have sleep apnea. Summary  Sleep apnea screening can help determine if you are at risk for sleep apnea.  It is important for your health care providers to know whether or not you have sleep apnea, especially if you are having surgery or have other chronic health conditions.  You may be asked to take a screening test for sleep apnea in preparation for surgery or as part of a physical exam. This information is not intended to replace advice given to you by your health care provider. Make sure you discuss any questions you have with your health care provider. Document Revised: 11/20/2017 Document Reviewed: 05/16/2016 Elsevier Patient Education  2021 Elsevier Inc. Quality Sleep Information, Adult Quality sleep is important for your mental and physical health. It also improves your quality of life. Quality sleep means you:  Are asleep for most of the time you are in bed.  Fall asleep within 30 minutes.  Wake up no more than once a night.  Are awake for no longer than 20 minutes if you do wake up during the night. Most adults need 7-8 hours of quality sleep each night. How can poor sleep affect me? If you do not get enough quality sleep, you may have:  Mood swings.  Daytime sleepiness.  Confusion.  Decreased reaction time.  Sleep disorders, such as insomnia and sleep apnea.  Difficulty with: ? Solving problems. ? Coping with stress. ? Paying attention. These issues may   affect your performance and productivity at work, school, and at home. Lack of sleep may also put you at higher risk for accidents, suicide, and risky behaviors. If you do not get quality sleep you may also be at higher risk for several health problems, including:  Infections.  Type 2 diabetes.  Heart disease.  High blood  pressure.  Obesity.  Worsening of long-term conditions, like arthritis, kidney disease, depression, Parkinson's disease, and epilepsy. What actions can I take to get more quality sleep?  Stick to a sleep schedule. Go to sleep and wake up at about the same time each day. Do not try to sleep less on weekdays and make up for lost sleep on weekends. This does not work.  Try to get about 30 minutes of exercise on most days. Do not exercise 2-3 hours before going to bed.  Limit naps during the day to 30 minutes or less.  Do not use any products that contain nicotine or tobacco, such as cigarettes or e-cigarettes. If you need help quitting, ask your health care provider.  Do not drink caffeinated beverages for at least 8 hours before going to bed. Coffee, tea, and some sodas contain caffeine.  Do not drink alcohol close to bedtime.  Do not eat large meals close to bedtime.  Do not take naps in the late afternoon.  Try to get at least 30 minutes of sunlight every day. Morning sunlight is best.  Make time to relax before bed. Reading, listening to music, or taking a hot bath promotes quality sleep.  Make your bedroom a place that promotes quality sleep. Keep your bedroom dark, quiet, and at a comfortable room temperature. Make sure your bed is comfortable. Take out sleep distractions like TV, a computer, smartphone, and bright lights.  If you are lying awake in bed for longer than 20 minutes, get up and do a relaxing activity until you feel sleepy.  Work with your health care provider to treat medical conditions that may affect sleeping, such as: ? Nasal obstruction. ? Snoring. ? Sleep apnea and other sleep disorders.  Talk to your health care provider if you think any of your prescription medicines may cause you to have difficulty falling or staying asleep.  If you have sleep problems, talk with a sleep consultant. If you think you have a sleep disorder, talk with your health care  provider about getting evaluated by a specialist.      Where to find more information  National Sleep Foundation website: https://sleepfoundation.org  National Heart, Lung, and Blood Institute (NHLBI): www.nhlbi.nih.gov/files/docs/public/sleep/healthy_sleep.pdf  Centers for Disease Control and Prevention (CDC): www.cdc.gov/sleep/index.html Contact a health care provider if you:  Have trouble getting to sleep or staying asleep.  Often wake up very early in the morning and cannot get back to sleep.  Have daytime sleepiness.  Have daytime sleep attacks of suddenly falling asleep and sudden muscle weakness (narcolepsy).  Have a tingling sensation in your legs with a strong urge to move your legs (restless legs syndrome).  Stop breathing briefly during sleep (sleep apnea).  Think you have a sleep disorder or are taking a medicine that is affecting your quality of sleep. Summary  Most adults need 7-8 hours of quality sleep each night.  Getting enough quality sleep is an important part of health and well-being.  Make your bedroom a place that promotes quality sleep and avoid things that may cause you to have poor sleep, such as alcohol, caffeine, smoking, and large meals.  Talk   to your health care provider if you have trouble falling asleep or staying asleep. This information is not intended to replace advice given to you by your health care provider. Make sure you discuss any questions you have with your health care provider. Document Revised: 05/13/2017 Document Reviewed: 05/13/2017 Elsevier Patient Education  2021 Elsevier Inc.  
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
## 2020-05-07 ENCOUNTER — Ambulatory Visit (INDEPENDENT_AMBULATORY_CARE_PROVIDER_SITE_OTHER): Payer: Medicare Other | Admitting: Neurology

## 2020-05-07 ENCOUNTER — Other Ambulatory Visit: Payer: Self-pay

## 2020-05-07 DIAGNOSIS — Z789 Other specified health status: Secondary | ICD-10-CM

## 2020-05-07 DIAGNOSIS — G478 Other sleep disorders: Secondary | ICD-10-CM

## 2020-05-07 DIAGNOSIS — Z8659 Personal history of other mental and behavioral disorders: Secondary | ICD-10-CM

## 2020-05-07 DIAGNOSIS — F10188 Alcohol abuse with other alcohol-induced disorder: Secondary | ICD-10-CM

## 2020-05-07 DIAGNOSIS — G4719 Other hypersomnia: Secondary | ICD-10-CM

## 2020-05-07 DIAGNOSIS — G4733 Obstructive sleep apnea (adult) (pediatric): Secondary | ICD-10-CM

## 2020-05-07 DIAGNOSIS — S069X9S Unspecified intracranial injury with loss of consciousness of unspecified duration, sequela: Secondary | ICD-10-CM

## 2020-05-07 DIAGNOSIS — M2619 Other specified anomalies of jaw-cranial base relationship: Secondary | ICD-10-CM

## 2020-05-07 DIAGNOSIS — G4752 REM sleep behavior disorder: Secondary | ICD-10-CM

## 2020-05-07 DIAGNOSIS — M2624 Reverse articulation: Secondary | ICD-10-CM

## 2020-05-08 ENCOUNTER — Telehealth: Payer: Self-pay

## 2020-05-08 ENCOUNTER — Other Ambulatory Visit: Payer: Self-pay

## 2020-05-08 DIAGNOSIS — G4733 Obstructive sleep apnea (adult) (pediatric): Secondary | ICD-10-CM

## 2020-05-08 NOTE — Telephone Encounter (Signed)
I called patient to discuss his sleep study results.  No answer, left a voicemail asking him to call me back.

## 2020-05-08 NOTE — Addendum Note (Signed)
Addended by: Lester Lower Burrell A on: 05/08/2020 03:11 PM   Modules accepted: Orders

## 2020-05-08 NOTE — Telephone Encounter (Signed)
-----   Message from Larey Seat, MD sent at 05/08/2020  8:49 AM EDT ----- IMPRESSION:  1. SEVERE Obstructive Sleep Apnea (OSA) with critical hypoxemia, Nadir at 64% 196minutes of desaturation time required addition of 2 liters of oxygen by nasal canula.  2. Patient refused CPAP trial with non FFM. He is CPAP intolerant.    RECOMMENDATIONS:  1. I would advise a full night, attended, PAP titration study to optimize therapy but the patient is categorically opposed.  2. His remaining treatment chances are with an inspire device or central device, but none will treat hypoxemia component. His BMI is under 33 and he would be a candidate given his apnea is all obstructive in origin. He will likely continue to need oxygen with inspire. I am referring him to ENT for evaluation.
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
## 2020-05-08 NOTE — Addendum Note (Signed)
Addended by: Larey Seat on: 05/08/2020 08:50 AM   Modules accepted: Orders

## 2020-05-08 NOTE — Telephone Encounter (Signed)
Patient's wife, [name], per DPR returned my call.  I discussed patient's sleep study results and recommendations.  Patient's wife would prefer that patient undergo a CPAP titration study due to patient's severe OSA with critical hypoxemia.  If patient cannot tolerate CPAP during this study they would be agreeable to an ENT referral.  Patient's wife reports that patient is claustrophobic but would be willing to try a CPAP.  I advised her that her sleep I will work with patient's insurance to get a CPAP titration study approved.  Our sleep I will be in touch with him to schedule the study.  Patient's wife would like this done as soon as possible.  I will cancel the ENT referral for now.  Patient's wife verbalized understanding of results.  Patient's wife did not have any further questions or concerns.

## 2020-05-08 NOTE — Progress Notes (Signed)
IMPRESSION:  1. SEVERE Obstructive Sleep Apnea (OSA) with critical hypoxemia, Nadir at 64% 15minutes of desaturation time required addition of 2 liters of oxygen by nasal canula.  2. Patient refused CPAP trial with non FFM. He is CPAP intolerant.    RECOMMENDATIONS:  1. I would advise a full night, attended, PAP titration study to optimize therapy but the patient is categorically opposed.  2. His remaining treatment chances are with an inspire device or central device, but none will treat hypoxemia component. His BMI is under 33 and he would be a candidate given his apnea is all obstructive in origin. He will likely continue to need oxygen with inspire. I am referring him to ENT for evaluation.

## 2020-05-09 NOTE — Progress Notes (Signed)
My concern is that he doesnt try CPAP and thinks it is not for him- I really want him to give it a try- a desensitization - nasal pillow ,etc- and if not working out ,  he should consider INspire

## 2020-05-29 NOTE — Progress Notes (Signed)
His hypoxia is not treatable with INSPIRE- he would not wear CPAP but he would wear a cannula with oxygen? He needs to understand that inspire is not be treating all aspects of his sleep disorder. He reused to try CPAP in the PSG  SPLIT night , which could have been his chance to try BiPAP , etc.  He would be best treated by PULMONOLOGY with this constellation of oxygen desaturation.   [place] [place] provider.

## 2020-05-31 ENCOUNTER — Ambulatory Visit (INDEPENDENT_AMBULATORY_CARE_PROVIDER_SITE_OTHER): Payer: Medicare Other | Admitting: Neurology

## 2020-05-31 ENCOUNTER — Other Ambulatory Visit: Payer: Self-pay

## 2020-05-31 DIAGNOSIS — F1910 Other psychoactive substance abuse, uncomplicated: Secondary | ICD-10-CM

## 2020-05-31 DIAGNOSIS — G4733 Obstructive sleep apnea (adult) (pediatric): Secondary | ICD-10-CM

## 2020-05-31 DIAGNOSIS — Z789 Other specified health status: Secondary | ICD-10-CM

## 2020-05-31 DIAGNOSIS — G4734 Idiopathic sleep related nonobstructive alveolar hypoventilation: Secondary | ICD-10-CM

## 2020-05-31 DIAGNOSIS — F10188 Alcohol abuse with other alcohol-induced disorder: Secondary | ICD-10-CM

## 2020-05-31 DIAGNOSIS — Z8659 Personal history of other mental and behavioral disorders: Secondary | ICD-10-CM

## 2020-05-31 DIAGNOSIS — S069X9S Unspecified intracranial injury with loss of consciousness of unspecified duration, sequela: Secondary | ICD-10-CM

## 2020-06-10 DIAGNOSIS — G4733 Obstructive sleep apnea (adult) (pediatric): Secondary | ICD-10-CM | POA: Insufficient documentation

## 2020-06-10 DIAGNOSIS — G4734 Idiopathic sleep related nonobstructive alveolar hypoventilation: Secondary | ICD-10-CM | POA: Insufficient documentation

## 2020-06-10 NOTE — Progress Notes (Signed)
DIAGNOSIS 1. Severe Obstructive Sleep Apnea did partially respond to CPAP at 12 cm water (AHI was 6.3/h over 19 minutes- resolution of hypoxia- all sleep supine) but snoring was still present, which had improved under BiPAP-  2. Notably was the finding that there was no apnea seen as long as the patient was not sleeping supine. 3. No central sleep apnea was seen. All Apnea was obstructive.  4. Sleep Related Hypoxemia had resolved under BiPAP therapy.  PLANS/RECOMMENDATIONS:  My main concern is the strong positional component- if there is any way for this patient to sleep non -supine, the apnea is controlled at 12 cm water CPAP or 19/14 cm BiPAP pressure. In order to allow non supine sleep, we should try a refitting of the mask and use of a chinstrap.   1. PAP therapy compliance is defined as 4 hours or more of nightly use.  2. Any apnea patient should avoid sedatives, hypnotics, and alcohol consumption at bedtime.   DISCUSSION: Can we get DME to treat him gently and allow a CPAP 8-16 cm water with 3 cm EPR  setting and with a nasal mask and chin strap. ENCOURAGE side sleeping.  Please encourage the patient to call back if this setting doesn't seem to work, he can exchange the CPAP/ BiPAP interface at no costs within 30 days.  Non compliant CPAP users who do not communicate with the sleep clinic or DME will be discharged.

## 2020-06-10 NOTE — Addendum Note (Signed)
Addended by: Larey Seat on: 06/10/2020 08:02 PM   Modules accepted: Orders
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
## 2020-06-10 NOTE — Progress Notes (Signed)
DIAGNOSIS 1. Severe Obstructive Sleep Apnea did partially respond to CPAP at 12 cm water (AHI was 6.3/h over 19 minutes- resolution of hypoxia- all sleep supine) but snoring was still present, which had improved under BiPAP-  2. Notably was the finding that there was no apnea seen as long as the patient was not sleeping supine. 3. No central sleep apnea was seen. All Apnea was obstructive.  4. Sleep Related Hypoxemia had resolved under BiPAP therapy.  PLANS/RECOMMENDATIONS:  My main concern is the strong positional component- if there is any way for this patient to sleep non -supine, the apnea is controlled at 12 cm water CPAP or 19/14 cm BiPAP pressure. In order to allow non supine sleep, we should try a refitting of the mask and use of a chinstrap.   1. PAP therapy compliance is defined as 4 hours or more of nightly use.  2. Any apnea patient should avoid sedatives, hypnotics, and alcohol consumption at bedtime.   DISCUSSION: Can we get DME to treat him gently and allow a CPAP 8-16 cm water with 3 cm EPR  setting and with a nasal mask and chin strap. ENCOURAGE side sleeping.  Please encourage the patient to call back if this setting doesn't seem to work, he can exchange the CPAP/ BiPAP interface at no costs within 30 days.

## 2020-06-12 ENCOUNTER — Telehealth: Payer: Self-pay | Admitting: Neurology

## 2020-06-12 NOTE — Telephone Encounter (Signed)
Called the patient to review the sleep study results.  As I was reviewing the study, patient interrupted stating that after he left the he was sick as a dog for 2 days.  When I questioned his definition of sick as a dog, he states that he was nauseous.  His wife stepped down on the conversation and stated that he was just "blah" for two days.  I advised the patient that he should not have gotten sick from the sleep study. I questioned if possibly he swallowed air that may have caused him to feel that way. He states he doesn't know. At this point the wife feels that given history of not doing well in the past he should come in and discuss alternative options for treatment. Was able to schedule 5/24 at 8:30 am for the patient. Wife and pt verbalized understanding.

## 2020-06-12 NOTE — Telephone Encounter (Signed)
-----   Message from Larey Seat, MD sent at 06/10/2020  8:02 PM EDT ----- DIAGNOSIS 1. Severe Obstructive Sleep Apnea did partially respond to CPAP at 12 cm water (AHI was 6.3/h over 19 minutes- resolution of hypoxia- all sleep supine) but snoring was still present, which had improved under BiPAP-  2. Notably was the finding that there was no apnea seen as long as the patient was not sleeping supine. 3. No central sleep apnea was seen. All Apnea was obstructive.  4. Sleep Related Hypoxemia had resolved under BiPAP therapy.  PLANS/RECOMMENDATIONS:  My main concern is the strong positional component- if there is any way for this patient to sleep non -supine, the apnea is controlled at 12 cm water CPAP or 19/14 cm BiPAP pressure. In order to allow non supine sleep, we should try a refitting of the mask and use of a chinstrap.   1. PAP therapy compliance is defined as 4 hours or more of nightly use.  2. Any apnea patient should avoid sedatives, hypnotics, and alcohol consumption at bedtime.   DISCUSSION: Can we get DME to treat him gently and allow a CPAP 8-16 cm water with 3 cm EPR  setting and with a nasal mask and chin strap. ENCOURAGE side sleeping.  Please encourage the patient to call back if this setting doesn't seem to work, he can exchange the CPAP/ BiPAP interface at no costs within 30 days.

## 2020-07-10 ENCOUNTER — Other Ambulatory Visit: Payer: Self-pay

## 2020-07-10 ENCOUNTER — Ambulatory Visit (INDEPENDENT_AMBULATORY_CARE_PROVIDER_SITE_OTHER): Payer: Medicare Other | Admitting: Neurology

## 2020-07-10 ENCOUNTER — Encounter: Payer: Self-pay | Admitting: Neurology

## 2020-07-10 VITALS — BP 138/78 | HR 85 | Ht 67.0 in | Wt 194.5 lb

## 2020-07-10 DIAGNOSIS — Z8659 Personal history of other mental and behavioral disorders: Secondary | ICD-10-CM | POA: Diagnosis not present

## 2020-07-10 DIAGNOSIS — Z789 Other specified health status: Secondary | ICD-10-CM

## 2020-07-10 DIAGNOSIS — G4733 Obstructive sleep apnea (adult) (pediatric): Secondary | ICD-10-CM | POA: Diagnosis not present

## 2020-07-10 DIAGNOSIS — G4719 Other hypersomnia: Secondary | ICD-10-CM

## 2020-07-10 DIAGNOSIS — G4736 Sleep related hypoventilation in conditions classified elsewhere: Secondary | ICD-10-CM

## 2020-07-10 DIAGNOSIS — G478 Other sleep disorders: Secondary | ICD-10-CM

## 2020-07-10 DIAGNOSIS — S069X9S Unspecified intracranial injury with loss of consciousness of unspecified duration, sequela: Secondary | ICD-10-CM

## 2020-07-10 NOTE — Patient Instructions (Signed)
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
## 2020-07-24 ENCOUNTER — Other Ambulatory Visit: Payer: Self-pay | Admitting: Surgery

## 2020-07-25 NOTE — Telephone Encounter (Signed)
JN pt

## 2020-07-27 ENCOUNTER — Other Ambulatory Visit: Payer: Self-pay

## 2020-07-27 ENCOUNTER — Ambulatory Visit (INDEPENDENT_AMBULATORY_CARE_PROVIDER_SITE_OTHER): Payer: Medicare Other | Admitting: Neurology

## 2020-07-27 ENCOUNTER — Other Ambulatory Visit: Payer: Self-pay | Admitting: Surgery

## 2020-07-27 DIAGNOSIS — G4733 Obstructive sleep apnea (adult) (pediatric): Secondary | ICD-10-CM

## 2020-07-27 DIAGNOSIS — S069X9S Unspecified intracranial injury with loss of consciousness of unspecified duration, sequela: Secondary | ICD-10-CM

## 2020-07-27 DIAGNOSIS — Z789 Other specified health status: Secondary | ICD-10-CM

## 2020-07-27 DIAGNOSIS — G4719 Other hypersomnia: Secondary | ICD-10-CM

## 2020-07-27 DIAGNOSIS — G4736 Sleep related hypoventilation in conditions classified elsewhere: Secondary | ICD-10-CM

## 2020-07-27 DIAGNOSIS — Z8659 Personal history of other mental and behavioral disorders: Secondary | ICD-10-CM

## 2020-07-30 NOTE — Procedures (Signed)
PATIENT'S NAME:  Anais, Koenen DOB:      November 29, 1960      MR#:    [id number]     DATE OF RECORDING: 07/27/2020 Jerrye Bushy REFERRING M.D.:  Cipriano Mile, NP Study Performed:   Attempted BiPAP Titration HISTORY:  Vyom D. Valbuena is a 60 y.o. year old 32 or [place] American male patient seen here as a referral on 04/17/2020 from [facility], NP-  Mr. Adkison returned after a PSG from 05-07-2020 documented severe OSA (AHI of 56, mostly NREM apnea, an oxygen nadir of 62% and total hypoxemia time of 168 minutes. He refused PAP trial during this SPLIT night order, so we billed for PSG only. He then returned for CPAP/ partial BiPAP titration polysomnogram performed on 05/31/2020 which revealed: Severe Obstructive Sleep Apnea did partially respond to CPAP at 12 cm water (AHI was 6.3/h over 19 minutes- resolution of hypoxia- all sleep supine) but snoring was still present, which had improved under BiPAP- Notably was the finding that there was no apnea seen as long as the patient was not sleeping supine. Sleep Related Hypoxemia had resolved under BiPAP therapy, but an optimal pressure was not found. He now returned to have a full night BiPAP titration on 07-27-2020: The patient endorsed the Epworth Sleepiness Scale at 15 points.   The patient's weight 194 pounds with a height of 67 (inches), resulting in a BMI of 30.4 kg/m2. The patient's neck circumference measured 18 inches.  CURRENT MEDICATIONS: Ventolin HFA, Norvasc, Lipitor, Zyrtec, Dexilant, Epinephrine, Prozac, Flonase, Flovent, Cozaar, Protonix, Topamax, Ultram, Desyrel, Zyprexa  PROCEDURE:  This is a multichannel digital polysomnogram utilizing the SomnoStar 11.2 system.  Electrodes and sensors were applied and monitored per AASM Specifications.   EEG, EOG, Chin and Limb EMG, were sampled at 200 Hz.  ECG, Snore and Nasal Pressure, Thermal Airflow, Respiratory Effort, CPAP Flow and Pressure, Oximetry was sampled at 50 Hz. Digital video and audio were  recorded.      BiPAP was initiated at 10/5 cmH20 with heated humidity per AASM split night standards and pressure was not advanced as the patient did sign out against medical advice.  Lights Out was at 22:23 and Lights On at 22:56. Total recording time (TRT) was 0 minutes, with a total sleep time (TST) of 0 minutes.  The sleep efficiency was 0 %.    DISCUSSION: This is the second time Mr. Belger refused to stay for titration and there will be no more attempts from our side. The patient will return to his PCP.   NO follow up appointments will be scheduled at [facility].    Larey Seat, M.D. Diplomat, Tax adviser of Psychiatry and Neurology  Diplomat, Tax adviser of Sleep Medicine Market researcher, [employer] [facility] at [employer]

## 2020-07-30 NOTE — Progress Notes (Signed)
DISCUSSION: This is the second time Gregory [name] refused to stay for titration and there will be no more attempts from our side. The patient will return to his PCP.

## 2020-07-31 ENCOUNTER — Telehealth: Payer: Self-pay | Admitting: Neurology

## 2020-07-31 NOTE — Telephone Encounter (Signed)
-----   Message from Larey Seat, MD sent at 07/30/2020  5:31 PM EDT -----  DISCUSSION: This is the second time Gregory [name] refused to stay for titration and there will be no more attempts from our side. The patient will return to his PCP.

## 2020-07-31 NOTE — Telephone Encounter (Signed)
Patient's wife returned call.  Before I could review the sleep study results she expressed her concerns from what she read on my chart. She states that she lives with the patient the whole time.  She states that he had fell asleep with the bed and woke up choking uncontrollably.  At that time she states the technician came in and asked the patient if he could continue the study at which point the patient stated he did not think he could.  Per the wife the technician stated that may be he needed to see ENT and she states that the technician told him that they could leave.  When advising the wife that the patient signed the AMA form her responses he must not have known what he was signing.  I advised that whenever someone signed an AMA form we have to explain what that means before they sign.  I asked if the wife was in there as the [place] form was being explained and she states that she must not have been paying attention. She proceeded to state "I know my husband and he would not have signed a AMA form unless he didn't know what he was signing. He would not have agreed to completing this study if he didn't want to pursue treatment. He was not refusing treatment" Informed the patient's wife that we would discuss with Dr. Brett Fairy in the sleep lab manager and that I would call her back with what their thoughts are.  I called the patient's wife back and have discussed that we reviewed the footage from the sleep study that night.  I informed her that we were able to see the conversation between the technician and patient discussing his inability to tolerate the mask.  Advised that I could see where the AMA form was discussed.  I informed the wife after further discussion with Dr. Brett Fairy she would be willing to order the auto CPAP as originally discussed.  We would send that order off to the company to have them get set up through them.  I informed of CPAP compliance and how he has to use the machine greater than 4  hours each night in order to get insurance coverage on the machine and supplies.  The wife verbalized understanding on all of this information and stated that he does want to be able to use the machine.  Scheduled a follow-up appointment for September and advised a letter will be sent with instructions. Pt verbalized understanding.

## 2020-07-31 NOTE — Telephone Encounter (Signed)
Called the pt's wife back. There was no answer LVM asking for a call back.

## 2020-07-31 NOTE — Telephone Encounter (Signed)
Pt's wife, Ebb Carelock (on [place]) called want to discuss events that took place at the sleep study. Would like a call from the nurse.

## 2020-08-02 NOTE — Progress Notes (Signed)
The patient left after signing an [place] form. I reviewed the Video/ audio and he stated he can't go through with the BiPAP test- he was gentle, no aggression, no frustration.

## 2020-09-13 ENCOUNTER — Ambulatory Visit: Payer: Medicare Other | Admitting: Neurology

## 2020-10-18 ENCOUNTER — Other Ambulatory Visit: Payer: Self-pay | Admitting: Specialist

## 2020-10-18 DIAGNOSIS — G8929 Other chronic pain: Secondary | ICD-10-CM

## 2020-10-18 DIAGNOSIS — M25561 Pain in right knee: Secondary | ICD-10-CM

## 2020-11-12 ENCOUNTER — Ambulatory Visit: Payer: Self-pay | Admitting: Neurology

## 2020-12-11 ENCOUNTER — Ambulatory Visit: Payer: Self-pay | Admitting: Neurology

## 2020-12-19 ENCOUNTER — Ambulatory Visit: Payer: Medicare Other | Admitting: Neurology

## 2021-01-20 ENCOUNTER — Other Ambulatory Visit: Payer: Self-pay | Admitting: Specialist

## 2021-01-20 DIAGNOSIS — G8929 Other chronic pain: Secondary | ICD-10-CM

## 2021-01-20 DIAGNOSIS — M25561 Pain in right knee: Secondary | ICD-10-CM

## 2021-01-22 ENCOUNTER — Encounter: Payer: Self-pay | Admitting: Internal Medicine

## 2021-01-22 ENCOUNTER — Ambulatory Visit (INDEPENDENT_AMBULATORY_CARE_PROVIDER_SITE_OTHER): Payer: Medicare Other | Admitting: Internal Medicine

## 2021-01-22 VITALS — BP 112/74 | HR 69 | Ht 67.0 in | Wt 192.1 lb

## 2021-01-22 DIAGNOSIS — K625 Hemorrhage of anus and rectum: Secondary | ICD-10-CM | POA: Diagnosis not present

## 2021-01-22 DIAGNOSIS — R194 Change in bowel habit: Secondary | ICD-10-CM | POA: Diagnosis not present

## 2021-01-22 DIAGNOSIS — K219 Gastro-esophageal reflux disease without esophagitis: Secondary | ICD-10-CM

## 2021-01-22 DIAGNOSIS — R1310 Dysphagia, unspecified: Secondary | ICD-10-CM | POA: Diagnosis not present

## 2021-01-22 MED ORDER — SUTAB 1479-225-188 MG PO TABS: 1.0000 | ORAL_TABLET | Freq: Once | ORAL | 0 refills | Status: AC

## 2021-01-22 NOTE — Patient Instructions (Signed)
If you are age 61 or older, your body mass index should be between 23-30. Your Body mass index is 30.09 kg/m. If this is out of the aforementioned range listed, please consider follow up with your Primary Care Provider.  If you are age 51 or younger, your body mass index should be between 19-25. Your Body mass index is 30.09 kg/m. If this is out of the aformentioned range listed, please consider follow up with your Primary Care Provider.   ________________________________________________________  The [place] [facility] providers would like to encourage you to use [facility] to communicate with providers for non-urgent requests or questions.  Due to long hold times on the telephone, sending your provider a message by [facility] may be a faster and more efficient way to get a response.  Please allow 48 business hours for a response.  Please remember that this is for non-urgent requests.  _______________________________________________________  Gregory [name] have been scheduled for an endoscopy and colonoscopy. Please follow the written instructions given to you at your visit today. Please pick up your prep supplies at the pharmacy within the next 1-3 days. If you use inhalers (even only as needed), please bring them with you on the day of your procedure.
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
## 2021-02-04 ENCOUNTER — Telehealth: Payer: Self-pay | Admitting: Neurology

## 2021-02-04 NOTE — Telephone Encounter (Signed)
Called and spoke w/ wife. Relayed message. No neeed for pt to f/u w/ neuro. advised to continue f/u with PCP at this point. Avoid supine sleep.  Does not qualify for inspire/dental device.

## 2021-02-04 NOTE — Telephone Encounter (Signed)
°  No need for appointment from my side - see my last note :  Please cancel the visit.  There is no alternative treatment for PAP to treat this apnea effectively- we can only ask him to not sleep supine as this reduced his apnea index.  We don't have to meet, this patient has hypoxia and remains neither a candidate for dental device nor inspire. \.me Larey Seat, MD

## 2021-02-07 ENCOUNTER — Ambulatory Visit: Payer: Medicare Other | Admitting: Neurology

## 2021-02-12 ENCOUNTER — Other Ambulatory Visit: Payer: Self-pay

## 2021-02-12 ENCOUNTER — Ambulatory Visit
Admission: RE | Admit: 2021-02-12 | Discharge: 2021-02-12 | Disposition: A | Discharge status: Home / Self Care | Payer: Medicare Other | Source: Ambulatory Visit | Attending: Registered Nurse | Admitting: Registered Nurse

## 2021-02-12 ENCOUNTER — Other Ambulatory Visit: Payer: Self-pay | Admitting: Registered Nurse

## 2021-02-12 DIAGNOSIS — J189 Pneumonia, unspecified organism: Secondary | ICD-10-CM

## 2021-02-27 ENCOUNTER — Other Ambulatory Visit: Payer: Self-pay | Admitting: Radiology

## 2021-02-27 ENCOUNTER — Other Ambulatory Visit: Payer: Self-pay

## 2021-02-27 ENCOUNTER — Ambulatory Visit: Payer: Self-pay

## 2021-02-27 ENCOUNTER — Encounter: Payer: Self-pay | Admitting: Specialist

## 2021-02-27 ENCOUNTER — Ambulatory Visit (INDEPENDENT_AMBULATORY_CARE_PROVIDER_SITE_OTHER): Payer: Medicare Other | Admitting: Specialist

## 2021-02-27 VITALS — BP 144/90 | HR 75 | Ht 67.0 in | Wt 192.0 lb

## 2021-02-27 DIAGNOSIS — M25641 Stiffness of right hand, not elsewhere classified: Secondary | ICD-10-CM | POA: Diagnosis not present

## 2021-02-27 DIAGNOSIS — M542 Cervicalgia: Secondary | ICD-10-CM | POA: Diagnosis not present

## 2021-02-27 DIAGNOSIS — R200 Anesthesia of skin: Secondary | ICD-10-CM | POA: Diagnosis not present

## 2021-02-27 DIAGNOSIS — M25642 Stiffness of left hand, not elsewhere classified: Secondary | ICD-10-CM

## 2021-02-27 MED ORDER — METHYLPREDNISOLONE 4 MG PO TBPK: ORAL_TABLET | ORAL | 0 refills | Status: DC
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
## 2021-02-27 NOTE — Addendum Note (Signed)
Addended by: Minda Ditto, Alyse Low N on: 02/27/2021 10:40 AM   Modules accepted: Orders

## 2021-02-27 NOTE — Addendum Note (Signed)
Addended by: Minda Ditto, Geoffery Spruce on: 02/27/2021 10:46 AM   Modules accepted: Orders

## 2021-03-01 LAB — COMPREHENSIVE METABOLIC PANEL
AG Ratio: 1.6 (calc) (ref 1.0–2.5)
ALT: 25 U/L (ref 9–46)
AST: 24 U/L (ref 10–35)
Albumin: 4.6 g/dL (ref 3.6–5.1)
Alkaline phosphatase (APISO): 80 U/L (ref 35–144)
BUN: 20 mg/dL (ref 7–25)
CO2: 29 mmol/L (ref 20–32)
Calcium: 9.7 mg/dL (ref 8.6–10.3)
Chloride: 102 mmol/L (ref 98–110)
Creat: 1.14 mg/dL (ref 0.70–1.35)
Globulin: 2.9 g/dL (calc) (ref 1.9–3.7)
Glucose, Bld: 85 mg/dL (ref 65–99)
Potassium: 4.4 mmol/L (ref 3.5–5.3)
Sodium: 138 mmol/L (ref 135–146)
Total Bilirubin: 0.4 mg/dL (ref 0.2–1.2)
Total Protein: 7.5 g/dL (ref 6.1–8.1)

## 2021-03-01 LAB — RHEUMATOID FACTOR: Rheumatoid fact SerPl-aCnc: <14 IU/mL (ref <14)

## 2021-03-01 LAB — CBC WITH DIFFERENTIAL/PLATELET
Absolute Monocytes: 969 cells/uL — ABNORMAL HIGH (ref 200–950)
Basophils Absolute: 41 cells/uL (ref 0–200)
Basophils Relative: 0.8 %
Eosinophils Absolute: 393 cells/uL (ref 15–500)
Eosinophils Relative: 7.7 %
HCT: 40.9 % (ref 38.5–50.0)
Hemoglobin: 13.5 g/dL (ref 13.2–17.1)
Lymphs Abs: 898 cells/uL (ref 850–3900)
MCH: 29.5 pg (ref 27.0–33.0)
MCHC: 33 g/dL (ref 32.0–36.0)
MCV: 89.3 fL (ref 80.0–100.0)
MPV: 10.6 fL (ref 7.5–12.5)
Monocytes Relative: 19 %
Neutro Abs: 2800 cells/uL (ref 1500–7800)
Neutrophils Relative %: 54.9 %
Platelets: 254 10*3/uL (ref 140–400)
RBC: 4.58 10*6/uL (ref 4.20–5.80)
RDW: 13.1 % (ref 11.0–15.0)
Total Lymphocyte: 17.6 %
WBC: 5.1 10*3/uL (ref 3.8–10.8)

## 2021-03-01 LAB — ANA: Anti Nuclear Antibody (ANA): NEGATIVE

## 2021-03-01 LAB — C-REACTIVE PROTEIN: CRP: 0.8 mg/L (ref <8.0)

## 2021-03-06 ENCOUNTER — Telehealth: Payer: Self-pay | Admitting: Internal Medicine
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
## 2021-03-07 NOTE — Telephone Encounter (Signed)
Spoke to patients wife and told her I would leave a Sutab sample up front for them to pick up.  She agreed.

## 2021-03-11 ENCOUNTER — Ambulatory Visit (AMBULATORY_SURGERY_CENTER): Payer: Medicare Other | Admitting: Internal Medicine

## 2021-03-11 ENCOUNTER — Encounter: Payer: Self-pay | Admitting: Internal Medicine

## 2021-03-11 VITALS — BP 136/93 | HR 63 | Temp 98.0°F | Resp 16 | Ht 67.0 in | Wt 192.0 lb

## 2021-03-11 DIAGNOSIS — K219 Gastro-esophageal reflux disease without esophagitis: Secondary | ICD-10-CM

## 2021-03-11 DIAGNOSIS — K625 Hemorrhage of anus and rectum: Secondary | ICD-10-CM

## 2021-03-11 DIAGNOSIS — Z86010 Personal history of colonic polyps: Secondary | ICD-10-CM

## 2021-03-11 DIAGNOSIS — R1310 Dysphagia, unspecified: Secondary | ICD-10-CM | POA: Diagnosis not present

## 2021-03-11 DIAGNOSIS — R194 Change in bowel habit: Secondary | ICD-10-CM

## 2021-03-11 MED ORDER — SODIUM CHLORIDE 0.9 % IV SOLN: 500.0000 mL | Freq: Once | INTRAVENOUS | Status: DC

## 2021-03-11 NOTE — Op Note (Signed)
[place] Patient Name: Gregory [name] Procedure Date: 03/11/2021 2:21 PM MRN: [id number] Endoscopist: Docia Chuck. Henrene Pastor , MD Age: 61 Referring MD:  Date of Birth: 15-Sep-1960 Gender: Male Account #: [id number] Procedure:                Colonoscopy Indications:              High risk colon cancer surveillance: Personal                            history of non-advanced adenoma. Previous                            examination February 2018 Medicines:                Monitored Anesthesia Care Procedure:                Pre-Anesthesia Assessment:                           - Prior to the procedure, a History and Physical                            was performed, and patient medications and                            allergies were reviewed. The patient's tolerance of                            previous anesthesia was also reviewed. The risks                            and benefits of the procedure and the sedation                            options and risks were discussed with the patient.                            All questions were answered, and informed consent                            was obtained. Prior Anticoagulants: The patient has                            taken no previous anticoagulant or antiplatelet                            agents. ASA Grade Assessment: II - A patient with                            mild systemic disease. After reviewing the risks                            and benefits, the patient was deemed in  satisfactory condition to undergo the procedure.                           After obtaining informed consent, the colonoscope                            was passed under direct vision. Throughout the                            procedure, the patient's blood pressure, pulse, and                            oxygen saturations were monitored continuously. The                            CF [id number] #[id number] was introduced through the anus                             and advanced to the the cecum, identified by                            appendiceal orifice and ileocecal valve. The                            ileocecal valve, appendiceal orifice, and rectum                            were photographed. The quality of the bowel                            preparation was excellent. The colonoscopy was                            performed without difficulty. The patient tolerated                            the procedure well. The bowel preparation used was                            SUPREP via split dose instruction. Scope In: 2:33:07 PM Scope Out: 2:45:19 PM Scope Withdrawal Time: 0 hours 8 minutes 49 seconds  Total Procedure Duration: 0 hours 12 minutes 12 seconds  Findings:                 The entire examined colon appeared normal on direct                            and retroflexion views. Complications:            No immediate complications. Estimated blood loss:                            None. Estimated Blood Loss:     Estimated blood loss: none. Impression:               - The  entire examined colon is normal on direct and                            retroflexion views.                           - No specimens collected. Recommendation:           - Repeat colonoscopy in 10 years for surveillance.                           - Patient has a contact number available for                            emergencies. The signs and symptoms of potential                            delayed complications were discussed with the                            patient. Return to normal activities tomorrow.                            Written discharge instructions were provided to the                            patient.                           - Resume previous diet.                           - Continue present medications. Docia Chuck. Henrene Pastor, MD 03/11/2021 2:49:51 PM This report has been signed electronically.

## 2021-03-11 NOTE — Op Note (Signed)
[place] Patient Name: Gregory [name] Procedure Date: 03/11/2021 2:20 PM MRN: [id number] Endoscopist: Docia Chuck. Henrene Pastor , MD Age: 61 Referring MD:  Date of Birth: Oct 21, 1960 Gender: Male Account #: [id number] Procedure:                Upper GI endoscopy Indications:              Dysphagia. Oropharyngeal type with complaints of                            nasal regurgitation Medicines:                Monitored Anesthesia Care Procedure:                Pre-Anesthesia Assessment:                           - Prior to the procedure, a History and Physical                            was performed, and patient medications and                            allergies were reviewed. The patient's tolerance of                            previous anesthesia was also reviewed. The risks                            and benefits of the procedure and the sedation                            options and risks were discussed with the patient.                            All questions were answered, and informed consent                            was obtained. Prior Anticoagulants: The patient has                            taken no previous anticoagulant or antiplatelet                            agents. ASA Grade Assessment: II - A patient with                            mild systemic disease. After reviewing the risks                            and benefits, the patient was deemed in                            satisfactory condition to undergo the procedure.  After obtaining informed consent, the endoscope was                            passed under direct vision. Throughout the                            procedure, the patient's blood pressure, pulse, and                            oxygen saturations were monitored continuously. The                            GIF [id number] #[id number] was introduced through the                            mouth, and advanced to the second part of  duodenum.                            The upper GI endoscopy was accomplished without                            difficulty. The patient tolerated the procedure                            well. Scope In: Scope Out: Findings:                 The esophagus was normal.                           The stomach was normal.                           The examined duodenum was normal.                           The cardia and gastric fundus were normal on                            retroflexion. Complications:            No immediate complications. Estimated Blood Loss:     Estimated blood loss: none. Impression:               1. Normal EGD                           2. Oropharyngeal dysphagia with nasal regurgitation. Recommendation:           1. See colonoscopy report                           2. Resume previous diet and previous medications                           3. PLEASE SCHEDULE MODIFIED barium swallow WITH  SPEECH PATHOLOGY "oropharyngeal dysphagia with                            nasal regurgitation, evaluate". Docia Chuck. Henrene Pastor, MD 03/11/2021 2:57:48 PM This report has been signed electronically.
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
## 2021-03-11 NOTE — Patient Instructions (Signed)
YOU HAD AN ENDOSCOPIC PROCEDURE TODAY AT THE [facility]:   Refer to the procedure report that was given to you for any specific questions about what was found during the examination.  If the procedure report does not answer your questions, please call your gastroenterologist to clarify.  If you requested that your care partner not be given the details of your procedure findings, then the procedure report has been included in a sealed envelope for you to review at your convenience later.  YOU SHOULD EXPECT: Some feelings of bloating in the abdomen. Passage of more gas than usual.  Walking can help get rid of the air that was put into your GI tract during the procedure and reduce the bloating. If you had a lower endoscopy (such as a colonoscopy or flexible sigmoidoscopy) you may notice spotting of blood in your stool or on the toilet paper. If you underwent a bowel prep for your procedure, you may not have a normal bowel movement for a few days.  Please Note:  You might notice some irritation and congestion in your nose or some drainage.  This is from the oxygen used during your procedure.  There is no need for concern and it should clear up in a day or so.  SYMPTOMS TO REPORT IMMEDIATELY:   Following lower endoscopy (colonoscopy or flexible sigmoidoscopy):  Excessive amounts of blood in the stool  Significant tenderness or worsening of abdominal pains  Swelling of the abdomen that is new, acute  Fever of 100F or higher   Following upper endoscopy (EGD)  Vomiting of blood or coffee ground material  New chest pain or pain under the shoulder blades  Painful or persistently difficult swallowing  New shortness of breath  Fever of 100F or higher  Black, tarry-looking stools  For urgent or emergent issues, a gastroenterologist can be reached at any hour by calling [phone]. Do not use MyChart messaging for urgent concerns.    DIET:  We do recommend a small meal at first, but  then you may proceed to your regular diet.  Drink plenty of fluids but you should avoid alcoholic beverages for 24 hours.  ACTIVITY:  You should plan to take it easy for the rest of today and you should NOT DRIVE or use heavy machinery until tomorrow (because of the sedation medicines used during the test).    FOLLOW UP: Our staff will call the number listed on your records 48-72 hours following your procedure to check on you and address any questions or concerns that you may have regarding the information given to you following your procedure. If we do not reach you, we will leave a message.  We will attempt to reach you two times.  During this call, we will ask if you have developed any symptoms of COVID 19. If you develop any symptoms (ie: fever, flu-like symptoms, shortness of breath, cough etc.) before then, please call [phone].  If you test positive for Covid 19 in the 2 weeks post procedure, please call and report this information to us.    If any biopsies were taken you will be contacted by phone or by letter within the next 1-3 weeks.  Please call us at [phone] if you have not heard about the biopsies in 3 weeks.    SIGNATURES/CONFIDENTIALITY: You and/or your care partner have signed paperwork which will be entered into your electronic medical record.  These signatures attest to the fact that that the information above on   your After Visit Summary has been reviewed and is understood.  Full responsibility of the confidentiality of this discharge information lies with you and/or your care-partner. 

## 2021-03-11 NOTE — Progress Notes (Signed)
Pt in recovery with monitors in place, VSS. Report given to receiving RN. Bite guard was placed with pt awake to ensure comfort. No dental or soft tissue damage noted. 

## 2021-03-11 NOTE — Progress Notes (Signed)
VS-DT 

## 2021-03-12 ENCOUNTER — Other Ambulatory Visit: Payer: Self-pay

## 2021-03-12 ENCOUNTER — Other Ambulatory Visit (HOSPITAL_COMMUNITY): Payer: Self-pay | Admitting: *Deleted

## 2021-03-12 DIAGNOSIS — R1310 Dysphagia, unspecified: Secondary | ICD-10-CM

## 2021-03-12 DIAGNOSIS — K219 Gastro-esophageal reflux disease without esophagitis: Secondary | ICD-10-CM

## 2021-03-13 ENCOUNTER — Telehealth: Payer: Self-pay

## 2021-03-13 NOTE — Telephone Encounter (Signed)
°  Follow up Call-  Call back number 03/11/2021  Post procedure Call Back phone  # [phone]  Permission to leave phone message Yes  Some recent data might be hidden     Patient questions:  Do you have a fever, pain , or abdominal swelling? No. Pain Score  0 *  Have you tolerated food without any problems? Yes.    Have you been able to return to your normal activities? Yes.    Do you have any questions about your discharge instructions: Diet   No. Medications  No. Follow up visit  No.  Do you have questions or concerns about your Care? No.  Actions: * If pain score is 4 or above: No action needed, pain <4.

## 2021-03-14 ENCOUNTER — Other Ambulatory Visit: Payer: Self-pay

## 2021-03-14 ENCOUNTER — Ambulatory Visit (HOSPITAL_COMMUNITY)
Admission: RE | Admit: 2021-03-14 | Discharge: 2021-03-14 | Disposition: A | Discharge status: Home / Self Care | Payer: Medicare Other | Source: Ambulatory Visit | Attending: Student | Admitting: Student

## 2021-03-14 DIAGNOSIS — K219 Gastro-esophageal reflux disease without esophagitis: Secondary | ICD-10-CM | POA: Insufficient documentation

## 2021-03-14 DIAGNOSIS — R1310 Dysphagia, unspecified: Secondary | ICD-10-CM | POA: Insufficient documentation

## 2021-03-14 NOTE — Progress Notes (Signed)
Speech-language pathology Modified barium swallow  - outpatient  Full evaluation can be found in Imaging section of chart.  Clinical Impression:  Pt presents with a normal oropharyngeal swallow with adequate mastication, timely swallow trigger, reliable laryngeal vestibule closure, no penetration/aspiration, adequate UES opening. Attempted to push the limits of his swallowing mechanics to elicit the symptomology he describes.  There was one event of explosive coughing that was difficult to subdue and NOT triggered by airway intrusion. There were no incidents of nasal regurgitation and velopharyngeal closure was normal - we discussed the possibilty that these coughing attacks when eating may precipitate expulsion of POs through the nasal cavity, rather than the reverse. After review and discussion,  Mr. and Mrs. Thum agreed to follow-up with Dr. Henrene Pastor and request a pulmonary consultation to investigate chronic cough, cough trigger sensitivity, and perhaps allergy w/u.  We discussed a behavioral strategy to initiate nasal inhalation, pursed lip slow exhalations at the onset of a coughing attack.  If the pulmonary testing is negative, the SLPs at [facility] are treating cough suppression techniques as another option.    Amara Manalang L. Tivis Ringer, [place] Office number [phone] Pager [phone]

## 2021-03-20 ENCOUNTER — Other Ambulatory Visit: Payer: Self-pay

## 2021-03-20 ENCOUNTER — Ambulatory Visit (INDEPENDENT_AMBULATORY_CARE_PROVIDER_SITE_OTHER): Payer: Medicare Other | Admitting: Physical Medicine and Rehabilitation

## 2021-03-20 ENCOUNTER — Encounter: Payer: Self-pay | Admitting: Physical Medicine and Rehabilitation

## 2021-03-20 DIAGNOSIS — R202 Paresthesia of skin: Secondary | ICD-10-CM

## 2021-03-20 NOTE — Progress Notes (Signed)
Gregory [name] - 61 y.o. male MRN [id number]  Date of birth: Oct 23, 1960  Office Visit Note: Visit Date: 03/20/2021 PCP: Cipriano Mile, NP Referred by: Jessy Oto, MD  Subjective: Chief Complaint  Patient presents with   Right Hand - Numbness   Left Hand - Numbness   HPI:  Gregory [name] is a 61 y.o. male who comes in today at the request of Dr. [name] for electrodiagnostic study of the Bilateral upper extremities.  Patient is Left hand dominant.  Patient complains of chronic worsening severe bilateral hand pain and numbness and tingling in all the digits in a nondermatomal fashion.  We really talked him the left hand is somewhat more ulnar digits.  The right hand is globally in all the digits.  He reports carrying a diagnosis of neuropathy.  He is not diabetic and I have not seen any electrodiagnostic studies confirming neuropathy.  He had prior electrodiagnostic study in 2020 that showed very mild bilateral median nerve neuropathy at the wrist.  He has a history of ACDF x2 as well as carpal tunnel release x2 and ulnar nerve transposition x2.  Dr. Louanne Skye noted some weakness of the left tricep muscle.  ROS Otherwise per HPI.  Assessment & Plan: Visit Diagnoses:    ICD-10-CM   1. Paresthesia of skin  R20.2 NCV with EMG (electromyography)      Plan: Impression: The above electrodiagnostic study is ABNORMAL and reveals evidence of:  a moderate left ulnar nerve entrapment at the elbow (carpal tunnel syndrome) affecting sensory and motor components.   a very mild bilateral median nerve entrapment at the wrist (carpal tunnel syndrome) affecting sensory components.   There is no significant electrodiagnostic evidence of any other focal nerve entrapment, brachial plexopathy or cervical radiculopathy.   Recommendations: 1.  Follow-up with referring physician. 2.  Continue current management of symptoms.  Meds & Orders: No orders of the defined types were placed in this  encounter.   Orders Placed This Encounter  Procedures   NCV with EMG (electromyography)    Follow-up: Return for [name], MD.   Procedures: No procedures performed  EMG & NCV Findings: Evaluation of the left ulnar motor nerve showed decreased conduction velocity (A Elbow-B Elbow, 40 m/s).  The left median (across palm) sensory nerve showed no response (Palm) and prolonged distal peak latency (4.4 ms).  The right median (across palm) sensory nerve showed prolonged distal peak latency (Wrist, 3.8 ms), reduced amplitude (9.3 V), and prolonged distal peak latency (Palm, 13.5 ms).  The left ulnar sensory nerve showed no response (Wrist).  All remaining nerves (as indicated in the following tables) were within normal limits.  All left vs. right side differences were within normal limits.    All examined muscles (as indicated in the following table) showed no evidence of electrical instability.    Impression: The above electrodiagnostic study is ABNORMAL and reveals evidence of:  a moderate left ulnar nerve entrapment at the elbow (carpal tunnel syndrome) affecting sensory and motor components.   a very mild bilateral median nerve entrapment at the wrist (carpal tunnel syndrome) affecting sensory components.   There is no significant electrodiagnostic evidence of any other focal nerve entrapment, brachial plexopathy or cervical radiculopathy.   Recommendations: 1.  Follow-up with referring physician. 2.  Continue current management of symptoms.  ___________________________ Wonda Olds Board Certified, American Board of Physical Medicine and Rehabilitation    Nerve Conduction Studies Anti Sensory Summary Table   Stim  Site NR Peak (ms) Norm Peak (ms) P-T Amp (V) Norm P-T Amp Site1 Site2 Delta-P (ms) Dist (cm) Vel (m/s) Norm Vel (m/s)  Left Median Acr Palm Anti Sensory (2nd Digit)  31.1C  Wrist    *4.4 <3.6 22.4 >10 Wrist Palm  0.0    Palm *NR  <2.0          Right Median  Acr Palm Anti Sensory (2nd Digit)  30.7C  Wrist    *3.8 <3.6 *9.3 >10 Wrist Palm 9.7 0.0    Palm    *13.5 <2.0 4.0         Left Radial Anti Sensory (Base 1st Digit)  31.2C  Wrist    2.3 <3.1 22.0  Wrist Base 1st Digit 2.3 0.0    Left Ulnar Anti Sensory (5th Digit)  31.4C  Wrist *NR  <3.7  >15.0 Wrist 5th Digit  14.0  >38   Motor Summary Table   Stim Site NR Onset (ms) Norm Onset (ms) O-P Amp (mV) Norm O-P Amp Site1 Site2 Delta-0 (ms) Dist (cm) Vel (m/s) Norm Vel (m/s)  Left Median Motor (Abd Poll Brev)  31.1C  Wrist    4.1 <4.2 7.9 >5 Elbow Wrist 4.6 24.0 52 >50  Elbow    8.7  7.4         Right Median Motor (Abd Poll Brev)  30.9C  Wrist    3.8 <4.2 7.4 >5 Elbow Wrist 4.4 22.5 51 >50  Elbow    8.2  6.8         Left Ulnar Motor (Abd Dig Min)  31.4C  Wrist    3.3 <4.2 14.0 >3 B Elbow Wrist 4.0 21.0 53 >53  B Elbow    7.3  11.1  A Elbow B Elbow 2.5 10.0 *40 >53  A Elbow    9.8  11.3          EMG   Side Muscle Nerve Root Ins Act Fibs Psw Amp Dur Poly Recrt Int Fraser Din Comment  Left Abd Poll Brev Median C8-T1 Nml Nml Nml Nml Nml 0 Nml Nml   Left 1stDorInt Ulnar C8-T1 Nml Nml Nml Nml Nml 0 Nml Nml   Left PronatorTeres Median C6-7 Nml Nml Nml Nml Nml 0 Nml Nml   Left Biceps Musculocut C5-6 Nml Nml Nml Nml Nml 0 Nml Nml   Left Deltoid Axillary C5-6 Nml Nml Nml Nml Nml 0 Nml Nml     Nerve Conduction Studies Anti Sensory Left/Right Comparison   Stim Site L Lat (ms) R Lat (ms) L-R Lat (ms) L Amp (V) R Amp (V) L-R Amp (%) Site1 Site2 L Vel (m/s) R Vel (m/s) L-R Vel (m/s)  Median Acr Palm Anti Sensory (2nd Digit)  31.1C  Wrist *4.4 *3.8 0.6 22.4 *9.3 58.5 Wrist Palm     Palm  *13.5   4.0        Radial Anti Sensory (Base 1st Digit)  31.2C  Wrist 2.3   22.0   Wrist Base 1st Digit     Ulnar Anti Sensory (5th Digit)  31.4C  Wrist       Wrist 5th Digit      Motor Left/Right Comparison   Stim Site L Lat (ms) R Lat (ms) L-R Lat (ms) L Amp (mV) R Amp (mV) L-R Amp (%) Site1 Site2 L Vel  (m/s) R Vel (m/s) L-R Vel (m/s)  Median Motor (Abd Poll Brev)  31.1C  Wrist 4.1 3.8 0.3 7.9 7.4 6.3 Elbow Wrist 52 51  1  Elbow 8.7 8.2 0.5 7.4 6.8 8.1       Ulnar Motor (Abd Dig Min)  31.4C  Wrist 3.3   14.0   B Elbow Wrist 53    B Elbow 7.3   11.1   A Elbow B Elbow *40    A Elbow 9.8   11.3            Waveforms:                 Clinical History: 07/30/18 EMG/NCS  Impression: The above electrodiagnostic study is ABNORMAL and reveals evidence of a mild bilateral median nerve entrapment at the wrist (carpal tunnel syndrome) affecting sensory and motor components.    There is no significant electrodiagnostic evidence of any other focal nerve entrapment, brachial plexopathy or cervical radiculopathy.    Recommendations: 1.  Follow-up with referring physician. 2.  Continue current management of symptoms. 3.  Continue use of resting splint at night-time and as needed during the day.   ___________________________ Wonda Olds Board Certified, American Board of Physical Medicine and Rehabilitation     Objective:  VS:  HT:     WT:    BMI:      BP:    HR: bpm   TEMP: ( )   RESP:  Physical Exam Musculoskeletal:        General: No tenderness.     Comments: Inspection reveals no atrophy of the bilateral APB or FDI or hand intrinsics.  There are well-healed surgical scars of ulnar transposition and carpal tunnel release on both arms.  There is no swelling, color changes, allodynia or dystrophic changes. There is 5 out of 5 strength in the bilateral wrist extension, finger abduction and long finger flexion. There is intact sensation to light touch in all dermatomal and peripheral nerve distributions.  There is a negative Hoffmann's test bilaterally.  Skin:    General: Skin is warm and dry.     Findings: No erythema or rash.  Neurological:     General: No focal deficit present.     Mental Status: He is alert and oriented to person, place, and time.     Sensory: No sensory  deficit.     Motor: No weakness or abnormal muscle tone.     Coordination: Coordination normal.     Gait: Gait normal.  Psychiatric:        Mood and Affect: Mood normal.        Behavior: Behavior normal.        Thought Content: Thought content normal.     Imaging: No results found.

## 2021-03-20 NOTE — Progress Notes (Signed)
Pt state both hands and all his fingers has numbness. Pt state he work with his hands and the numbness os always there. Pt state he has had 2x CTS surgery. Pt state he takes over the counter pain meds and uses heat / ice to help ease the numbness. Pt state he is left handed.  Numeric Pain Rating Scale and Functional Assessment Average Pain 8   In the last MONTH (on 0-10 scale) has pain interfered with the following?  1. General activity like being  able to carry out your everyday physical activities such as walking, climbing stairs, carrying groceries, or moving a chair?  Rating(10)

## 2021-03-21 ENCOUNTER — Encounter: Payer: Self-pay | Admitting: Specialist

## 2021-03-21 ENCOUNTER — Ambulatory Visit (INDEPENDENT_AMBULATORY_CARE_PROVIDER_SITE_OTHER): Payer: Medicare Other | Admitting: Specialist

## 2021-03-21 VITALS — BP 113/75 | HR 72 | Ht 67.0 in | Wt 192.0 lb

## 2021-03-21 DIAGNOSIS — M19041 Primary osteoarthritis, right hand: Secondary | ICD-10-CM

## 2021-03-21 DIAGNOSIS — M25641 Stiffness of right hand, not elsewhere classified: Secondary | ICD-10-CM | POA: Diagnosis not present

## 2021-03-21 DIAGNOSIS — R200 Anesthesia of skin: Secondary | ICD-10-CM

## 2021-03-21 DIAGNOSIS — M79642 Pain in left hand: Secondary | ICD-10-CM

## 2021-03-21 DIAGNOSIS — M79641 Pain in right hand: Secondary | ICD-10-CM | POA: Diagnosis not present

## 2021-03-21 DIAGNOSIS — Z7689 Persons encountering health services in other specified circumstances: Secondary | ICD-10-CM

## 2021-03-21 DIAGNOSIS — M4712 Other spondylosis with myelopathy, cervical region: Secondary | ICD-10-CM

## 2021-03-21 DIAGNOSIS — N182 Chronic kidney disease, stage 2 (mild): Secondary | ICD-10-CM

## 2021-03-21 DIAGNOSIS — Z8739 Personal history of other diseases of the musculoskeletal system and connective tissue: Secondary | ICD-10-CM

## 2021-03-21 DIAGNOSIS — M19042 Primary osteoarthritis, left hand: Secondary | ICD-10-CM

## 2021-03-21 DIAGNOSIS — M25642 Stiffness of left hand, not elsewhere classified: Secondary | ICD-10-CM

## 2021-03-21 NOTE — Patient Instructions (Signed)
Plan: Hands are  suffering from osteoarthritis, only real proven treatments are Weight loss, tylenol, gabapentin and exercise. Well padded shoes help. Weight loss and exercise. Well padded shoes help. Hot showers in the AM.  Injection with steroid may be of benefit. Hemp CBD capsules, [email or web address] 5,000-7,000 mg per bottle, 60 capsules per bottle, take one capsule twice a day.  Follow-Up Instructions: No follow-ups on file.
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
## 2021-03-21 NOTE — Procedures (Signed)
EMG & NCV Findings: Evaluation of the left ulnar motor nerve showed decreased conduction velocity (A Elbow-B Elbow, 40 m/s).  The left median (across palm) sensory nerve showed no response (Palm) and prolonged distal peak latency (4.4 ms).  The right median (across palm) sensory nerve showed prolonged distal peak latency (Wrist, 3.8 ms), reduced amplitude (9.3 V), and prolonged distal peak latency (Palm, 13.5 ms).  The left ulnar sensory nerve showed no response (Wrist).  All remaining nerves (as indicated in the following tables) were within normal limits.  All left vs. right side differences were within normal limits.    All examined muscles (as indicated in the following table) showed no evidence of electrical instability.    Impression: The above electrodiagnostic study is ABNORMAL and reveals evidence of:  a moderate left ulnar nerve entrapment at the elbow (carpal tunnel syndrome) affecting sensory and motor components.   a very mild bilateral median nerve entrapment at the wrist (carpal tunnel syndrome) affecting sensory components.   There is no significant electrodiagnostic evidence of any other focal nerve entrapment, brachial plexopathy or cervical radiculopathy.   Recommendations: 1.  Follow-up with referring physician. 2.  Continue current management of symptoms.  ___________________________ Laurence Spates FAAPMR Board Certified, American Board of Physical Medicine and Rehabilitation    Nerve Conduction Studies Anti Sensory Summary Table   Stim Site NR Peak (ms) Norm Peak (ms) P-T Amp (V) Norm P-T Amp Site1 Site2 Delta-P (ms) Dist (cm) Vel (m/s) Norm Vel (m/s)  Left Median Acr Palm Anti Sensory (2nd Digit)  31.1C  Wrist    *4.4 <3.6 22.4 >10 Wrist Palm  0.0    Palm *NR  <2.0          Right Median Acr Palm Anti Sensory (2nd Digit)  30.7C  Wrist    *3.8 <3.6 *9.3 >10 Wrist Palm 9.7 0.0    Palm    *13.5 <2.0 4.0         Left Radial Anti Sensory (Base 1st Digit)  31.2C   Wrist    2.3 <3.1 22.0  Wrist Base 1st Digit 2.3 0.0    Left Ulnar Anti Sensory (5th Digit)  31.4C  Wrist *NR  <3.7  >15.0 Wrist 5th Digit  14.0  >38   Motor Summary Table   Stim Site NR Onset (ms) Norm Onset (ms) O-P Amp (mV) Norm O-P Amp Site1 Site2 Delta-0 (ms) Dist (cm) Vel (m/s) Norm Vel (m/s)  Left Median Motor (Abd Poll Brev)  31.1C  Wrist    4.1 <4.2 7.9 >5 Elbow Wrist 4.6 24.0 52 >50  Elbow    8.7  7.4         Right Median Motor (Abd Poll Brev)  30.9C  Wrist    3.8 <4.2 7.4 >5 Elbow Wrist 4.4 22.5 51 >50  Elbow    8.2  6.8         Left Ulnar Motor (Abd Dig Min)  31.4C  Wrist    3.3 <4.2 14.0 >3 B Elbow Wrist 4.0 21.0 53 >53  B Elbow    7.3  11.1  A Elbow B Elbow 2.5 10.0 *40 >53  A Elbow    9.8  11.3          EMG   Side Muscle Nerve Root Ins Act Fibs Psw Amp Dur Poly Recrt Int Fraser Din Comment  Left Abd Poll Brev Median C8-T1 Nml Nml Nml Nml Nml 0 Nml Nml   Left 1stDorInt Ulnar C8-T1 Nml Nml Nml  Nml Nml 0 Nml Nml   Left PronatorTeres Median C6-7 Nml Nml Nml Nml Nml 0 Nml Nml   Left Biceps Musculocut C5-6 Nml Nml Nml Nml Nml 0 Nml Nml   Left Deltoid Axillary C5-6 Nml Nml Nml Nml Nml 0 Nml Nml     Nerve Conduction Studies Anti Sensory Left/Right Comparison   Stim Site L Lat (ms) R Lat (ms) L-R Lat (ms) L Amp (V) R Amp (V) L-R Amp (%) Site1 Site2 L Vel (m/s) R Vel (m/s) L-R Vel (m/s)  Median Acr Palm Anti Sensory (2nd Digit)  31.1C  Wrist *4.4 *3.8 0.6 22.4 *9.3 58.5 Wrist Palm     Palm  *13.5   4.0        Radial Anti Sensory (Base 1st Digit)  31.2C  Wrist 2.3   22.0   Wrist Base 1st Digit     Ulnar Anti Sensory (5th Digit)  31.4C  Wrist       Wrist 5th Digit      Motor Left/Right Comparison   Stim Site L Lat (ms) R Lat (ms) L-R Lat (ms) L Amp (mV) R Amp (mV) L-R Amp (%) Site1 Site2 L Vel (m/s) R Vel (m/s) L-R Vel (m/s)  Median Motor (Abd Poll Brev)  31.1C  Wrist 4.1 3.8 0.3 7.9 7.4 6.3 Elbow Wrist 52 51 1  Elbow 8.7 8.2 0.5 7.4 6.8 8.1       Ulnar Motor  (Abd Dig Min)  31.4C  Wrist 3.3   14.0   B Elbow Wrist 53    B Elbow 7.3   11.1   A Elbow B Elbow *40    A Elbow 9.8   11.3            Waveforms:

## 2021-03-27 ENCOUNTER — Ambulatory Visit: Payer: Medicare Other | Attending: Specialist | Admitting: Occupational Therapy

## 2021-03-27 ENCOUNTER — Other Ambulatory Visit: Payer: Self-pay

## 2021-03-27 DIAGNOSIS — M79641 Pain in right hand: Secondary | ICD-10-CM | POA: Diagnosis present

## 2021-03-27 DIAGNOSIS — M79642 Pain in left hand: Secondary | ICD-10-CM | POA: Diagnosis not present

## 2021-03-27 DIAGNOSIS — M6281 Muscle weakness (generalized): Secondary | ICD-10-CM | POA: Diagnosis present

## 2021-03-27 DIAGNOSIS — R208 Other disturbances of skin sensation: Secondary | ICD-10-CM | POA: Diagnosis present

## 2021-03-27 DIAGNOSIS — M25642 Stiffness of left hand, not elsewhere classified: Secondary | ICD-10-CM | POA: Diagnosis present

## 2021-03-27 DIAGNOSIS — M25641 Stiffness of right hand, not elsewhere classified: Secondary | ICD-10-CM | POA: Diagnosis present
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
## 2021-03-30 LAB — VITAMIN B12: Vitamin B-12: 436 pg/mL (ref 200–1100)

## 2021-03-30 LAB — VITAMIN B1: Vitamin B1 (Thiamine): <6 nmol/L — ABNORMAL LOW (ref 8–30)

## 2021-04-01 ENCOUNTER — Other Ambulatory Visit: Payer: Self-pay | Admitting: Specialist

## 2021-04-01 ENCOUNTER — Other Ambulatory Visit: Payer: Self-pay

## 2021-04-01 ENCOUNTER — Ambulatory Visit: Payer: Medicare Other | Admitting: Occupational Therapy

## 2021-04-01 DIAGNOSIS — M25642 Stiffness of left hand, not elsewhere classified: Secondary | ICD-10-CM

## 2021-04-01 DIAGNOSIS — M79641 Pain in right hand: Secondary | ICD-10-CM

## 2021-04-01 DIAGNOSIS — M6281 Muscle weakness (generalized): Secondary | ICD-10-CM

## 2021-04-01 DIAGNOSIS — M25641 Stiffness of right hand, not elsewhere classified: Secondary | ICD-10-CM

## 2021-04-01 DIAGNOSIS — M79642 Pain in left hand: Secondary | ICD-10-CM | POA: Diagnosis not present

## 2021-04-01 DIAGNOSIS — R208 Other disturbances of skin sensation: Secondary | ICD-10-CM
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
## 2021-04-02 ENCOUNTER — Ambulatory Visit (INDEPENDENT_AMBULATORY_CARE_PROVIDER_SITE_OTHER): Payer: Medicare Other | Admitting: Pulmonary Disease

## 2021-04-02 ENCOUNTER — Encounter: Payer: Self-pay | Admitting: Pulmonary Disease

## 2021-04-02 ENCOUNTER — Other Ambulatory Visit: Payer: Self-pay

## 2021-04-02 VITALS — BP 122/68 | HR 65 | Temp 98.2°F | Ht 67.0 in | Wt 200.3 lb

## 2021-04-02 DIAGNOSIS — R053 Chronic cough: Secondary | ICD-10-CM | POA: Diagnosis not present

## 2021-04-02 MED ORDER — BREO ELLIPTA 200-25 MCG/ACT IN AEPB: 1.0000 | INHALATION_SPRAY | Freq: Every day | RESPIRATORY_TRACT | 11 refills | Status: AC

## 2021-04-02 MED ORDER — ALBUTEROL SULFATE HFA 108 (90 BASE) MCG/ACT IN AERS: 2.0000 | INHALATION_SPRAY | Freq: Four times a day (QID) | RESPIRATORY_TRACT | 6 refills | Status: AC | PRN

## 2021-04-02 MED ORDER — MONTELUKAST SODIUM 10 MG PO TABS: 10.0000 mg | ORAL_TABLET | Freq: Every day | ORAL | 11 refills | Status: DC

## 2021-04-02 NOTE — Patient Instructions (Addendum)
Nice to meet you  Based on your CT scan, your cough, and shortness of breath, I am worried that something like asthma is contributing to your cough and your shortness of breath.  To treat this, I recommend using Breo 1 puff daily.  Rinse your mouth after every use.  If this is too expensive, please ask the pharmacist what the cheaper option in this category would be.  If they cannot help you, please call me and we will try to find a better alternative.  When you start the new inhaler you can stop the Flovent that is currently on your medication list if you are still taking this.  In addition, I prescribed albuterol inhaler to be used as needed.  This is a rescue inhaler.  Use 2 puffs every 4-6 hours as needed for chest tightness, cough, shortness of breath.  Lastly, to start treating some of the nasal congestion, use montelukast 1 pill nightly.  In addition, use the sinus rinse twice a day for a week then once a day.  You must use distilled or purified water.  You can get the salt packets to go in it over-the-counter.  Go to the sinus or cough aisle at the drugstore.  Return to clinic in 3 months or sooner as needed with Dr. Silas Flood

## 2021-04-03 ENCOUNTER — Ambulatory Visit: Payer: Medicare Other | Admitting: Occupational Therapy

## 2021-04-03 DIAGNOSIS — M25641 Stiffness of right hand, not elsewhere classified: Secondary | ICD-10-CM

## 2021-04-03 DIAGNOSIS — M79641 Pain in right hand: Secondary | ICD-10-CM

## 2021-04-03 DIAGNOSIS — M79642 Pain in left hand: Secondary | ICD-10-CM | POA: Diagnosis not present
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
## 2021-04-03 NOTE — Patient Instructions (Signed)
° °  Your Splint This splint should initially be fitted by a healthcare practitioner.  The healthcare practitioner is responsible for providing wearing instructions and precautions to the patient, other healthcare practitioners and care provider involved in the patient's care.  This splint was custom made for you. Please read the following instructions to learn about wearing and caring for your splint.  Precautions Should your splint cause any of the following problems, remove the splint immediately and contact your therapist/physician. Swelling Severe Pain Pressure Areas Stiffness Numbness  Do not wear your splint while operating machinery unless it has been fabricated for that purpose.  When To Wear Your Splint Where your splint according to your therapist/physician instructions. Nights and rest periods only  (however begin by wearing during the day increasing by an hour each time and monitor skin for pressure sores/blisters. Once you can tolerate 4 consecutive hours with no issues, then ok to switch to night time)  Care and Cleaning of Your Splint Keep your splint away from open flames and heat sources.  Keep away from pets! Your splint will lose its shape in temperatures over 135 degrees Farenheit, ( in car windows, near radiators, ovens or in hot water).  Never make any adjustments to your splint, if the splint needs adjusting remove it and make an appointment to see your therapist. Your splint may be cleaned with rubbing alcohol.  Do not immerse in hot water over 135 degrees Farenheit.

## 2021-04-08 ENCOUNTER — Encounter: Payer: Self-pay | Admitting: Occupational Therapy

## 2021-04-08 ENCOUNTER — Other Ambulatory Visit: Payer: Self-pay

## 2021-04-08 ENCOUNTER — Ambulatory Visit: Payer: Medicare Other | Admitting: Occupational Therapy

## 2021-04-08 DIAGNOSIS — M79641 Pain in right hand: Secondary | ICD-10-CM

## 2021-04-08 DIAGNOSIS — M79642 Pain in left hand: Secondary | ICD-10-CM

## 2021-04-08 DIAGNOSIS — M6281 Muscle weakness (generalized): Secondary | ICD-10-CM

## 2021-04-08 DIAGNOSIS — M25641 Stiffness of right hand, not elsewhere classified: Secondary | ICD-10-CM

## 2021-04-08 DIAGNOSIS — R208 Other disturbances of skin sensation: Secondary | ICD-10-CM

## 2021-04-08 DIAGNOSIS — M25642 Stiffness of left hand, not elsewhere classified: Secondary | ICD-10-CM
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
## 2021-04-10 ENCOUNTER — Ambulatory Visit: Payer: Medicare Other | Admitting: Occupational Therapy

## 2021-04-15 ENCOUNTER — Other Ambulatory Visit: Payer: Self-pay

## 2021-04-15 ENCOUNTER — Ambulatory Visit: Payer: Medicare Other | Admitting: Occupational Therapy

## 2021-04-15 VITALS — HR 69

## 2021-04-15 DIAGNOSIS — M25641 Stiffness of right hand, not elsewhere classified: Secondary | ICD-10-CM

## 2021-04-15 DIAGNOSIS — R208 Other disturbances of skin sensation: Secondary | ICD-10-CM

## 2021-04-15 DIAGNOSIS — M25642 Stiffness of left hand, not elsewhere classified: Secondary | ICD-10-CM

## 2021-04-15 DIAGNOSIS — M6281 Muscle weakness (generalized): Secondary | ICD-10-CM

## 2021-04-15 DIAGNOSIS — M79642 Pain in left hand: Secondary | ICD-10-CM | POA: Diagnosis not present

## 2021-04-15 NOTE — Patient Instructions (Signed)
°  1. Grip Strengthening (Resistive Putty)   Squeeze putty using thumb and all fingers. Repeat _20___ times. Do __1-2__ sessions per day for BOTH hands. Use green putty   2. Roll putty into tube on table and pinch between first two fingers and thumb x 10 reps. Do 1-2 sessions per day, Rt hand. Use red putty
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
## 2021-04-17 ENCOUNTER — Ambulatory Visit: Payer: Medicare Other | Admitting: Occupational Therapy

## 2021-04-20 ENCOUNTER — Other Ambulatory Visit: Payer: Self-pay | Admitting: Specialist

## 2021-04-20 DIAGNOSIS — M25561 Pain in right knee: Secondary | ICD-10-CM

## 2021-04-20 DIAGNOSIS — G8929 Other chronic pain: Secondary | ICD-10-CM

## 2021-04-22 ENCOUNTER — Ambulatory Visit: Payer: Medicare Other | Admitting: Occupational Therapy

## 2021-04-24 ENCOUNTER — Ambulatory Visit: Payer: Medicare Other | Attending: Student | Admitting: Occupational Therapy

## 2021-04-24 ENCOUNTER — Other Ambulatory Visit: Payer: Self-pay

## 2021-04-24 DIAGNOSIS — M25642 Stiffness of left hand, not elsewhere classified: Secondary | ICD-10-CM | POA: Diagnosis present

## 2021-04-24 DIAGNOSIS — R296 Repeated falls: Secondary | ICD-10-CM | POA: Diagnosis present

## 2021-04-24 DIAGNOSIS — M25641 Stiffness of right hand, not elsewhere classified: Secondary | ICD-10-CM | POA: Diagnosis not present

## 2021-04-24 DIAGNOSIS — R2689 Other abnormalities of gait and mobility: Secondary | ICD-10-CM | POA: Diagnosis present

## 2021-04-24 DIAGNOSIS — M6281 Muscle weakness (generalized): Secondary | ICD-10-CM
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
## 2021-04-29 ENCOUNTER — Ambulatory Visit: Payer: Medicare Other | Admitting: Occupational Therapy

## 2021-05-01 ENCOUNTER — Ambulatory Visit: Payer: Medicare Other | Admitting: Occupational Therapy

## 2021-05-02 ENCOUNTER — Ambulatory Visit (INDEPENDENT_AMBULATORY_CARE_PROVIDER_SITE_OTHER): Payer: Medicare Other | Admitting: Specialist

## 2021-05-02 ENCOUNTER — Encounter: Payer: Self-pay | Admitting: Specialist

## 2021-05-02 ENCOUNTER — Other Ambulatory Visit: Payer: Self-pay

## 2021-05-02 VITALS — BP 109/70 | HR 68 | Ht 67.0 in | Wt 192.0 lb

## 2021-05-02 DIAGNOSIS — R200 Anesthesia of skin: Secondary | ICD-10-CM | POA: Diagnosis not present

## 2021-05-02 DIAGNOSIS — Z981 Arthrodesis status: Secondary | ICD-10-CM

## 2021-05-02 DIAGNOSIS — N182 Chronic kidney disease, stage 2 (mild): Secondary | ICD-10-CM

## 2021-05-02 DIAGNOSIS — M4712 Other spondylosis with myelopathy, cervical region: Secondary | ICD-10-CM | POA: Diagnosis not present

## 2021-05-02 DIAGNOSIS — M19042 Primary osteoarthritis, left hand: Secondary | ICD-10-CM

## 2021-05-02 DIAGNOSIS — G5623 Lesion of ulnar nerve, bilateral upper limbs: Secondary | ICD-10-CM

## 2021-05-02 DIAGNOSIS — F1096 Alcohol use, unspecified with alcohol-induced persisting amnestic disorder: Secondary | ICD-10-CM

## 2021-05-02 DIAGNOSIS — Z8739 Personal history of other diseases of the musculoskeletal system and connective tissue: Secondary | ICD-10-CM
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
## 2021-05-02 NOTE — Patient Instructions (Addendum)
Plan: Hands are  suffering from osteoarthritis, only real proven treatments are ?Weight loss, tylenol, gabapentin and exercise. ?Well padded shoes help. ?Weight loss and exercise. ?Well padded shoes help. ?Hot showers in the AM.  ?Injection with steroid may be of benefit. ?Hemp CBD capsules, amazon.com 5,000-7,000 mg per bottle, 60 capsules per bottle, take one capsule twice a day. ?Continue with PT and OT, starting PT for ataxia due to [facility] ?

## 2021-05-07 ENCOUNTER — Ambulatory Visit: Payer: Medicare Other | Admitting: Occupational Therapy

## 2021-05-07 ENCOUNTER — Other Ambulatory Visit: Payer: Self-pay

## 2021-05-07 DIAGNOSIS — M25642 Stiffness of left hand, not elsewhere classified: Secondary | ICD-10-CM

## 2021-05-07 DIAGNOSIS — M25641 Stiffness of right hand, not elsewhere classified: Secondary | ICD-10-CM

## 2021-05-07 NOTE — Patient Instructions (Signed)
?  Memory Compensation Strategies  Use "WARM" strategy. W= write it down A=  associate it R=  repeat it M=  make a mental picture  You can keep a Memory Notebook. Use a 3-ring notebook with sections for the following:  calendar, important names and phone numbers, medications, doctors' names/phone numbers, "to do list"/reminders, and a section to journal what you did each day  Use a calendar to write appointments down.  Write yourself a schedule for the day.  This can be placed on the calendar or in a separate section of the Memory Notebook.  Keeping a regular schedule can help memory.  Use medication organizer with sections for each day or morning/evening pills  You may need help loading it  Keep a basket, or pegboard by the door.   Place items that you need to take out with you in the basket or on the pegboard.  You may also want to include a message board for reminders.  Use sticky notes. Place sticky notes with reminders in a place where the task is performed.  For example:  "turn off the stove" placed by the stove, "lock the door" placed on the door at eye level, "take your medications" on the bathroom mirror or by the place where you normally take your medications  Use alarms, timers, and/or a reminder app. Use while cooking to remind yourself to check on food or as a reminder to take your medicine, or as a reminder to make a call, or as a reminder to perform another task, etc.  Use a voice recorder app or small tape recorder to record important information and notes for yourself. Go back at the end of the day and listen to these.  
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
## 2021-05-09 ENCOUNTER — Other Ambulatory Visit: Payer: Self-pay

## 2021-05-09 ENCOUNTER — Ambulatory Visit: Payer: Medicare Other | Admitting: Physical Therapy

## 2021-05-09 DIAGNOSIS — R296 Repeated falls: Secondary | ICD-10-CM

## 2021-05-09 DIAGNOSIS — R2689 Other abnormalities of gait and mobility: Secondary | ICD-10-CM

## 2021-05-09 DIAGNOSIS — M6281 Muscle weakness (generalized): Secondary | ICD-10-CM

## 2021-05-09 DIAGNOSIS — M25641 Stiffness of right hand, not elsewhere classified: Secondary | ICD-10-CM | POA: Diagnosis not present
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
## 2021-05-10 ENCOUNTER — Ambulatory Visit (INDEPENDENT_AMBULATORY_CARE_PROVIDER_SITE_OTHER): Payer: Medicare Other | Admitting: Family Medicine

## 2021-05-10 DIAGNOSIS — Z720 Tobacco use: Secondary | ICD-10-CM | POA: Diagnosis not present

## 2021-05-10 DIAGNOSIS — Z789 Other specified health status: Secondary | ICD-10-CM | POA: Diagnosis not present

## 2021-05-10 DIAGNOSIS — M2170 Unequal limb length (acquired), unspecified site: Secondary | ICD-10-CM

## 2021-05-10 NOTE — Assessment & Plan Note (Signed)
Discontinued smoking ?

## 2021-05-10 NOTE — Assessment & Plan Note (Signed)
New temporary insoles with build up given today ?Recommend f/u 6 m ?

## 2021-05-10 NOTE — Progress Notes (Signed)
?  Gregory [name] - 61 y.o. male MRN [id number]  Date of birth: 31-Dec-1960 ? ? ? ?SUBJECTIVE:    ?  ?Chief Complaint:/ HPI:  ? Needs new insoles for abnormal gait with leg length discrepancy ?Working well for him ?Has had current pair > 2.5 years. ? ?Still having difficulty with left knee flexion but has full extension (s/p TKR). Only concern from his is inability to ride stationary bike. 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 ?Recently completed Chef course and now has graduated. ? ? ? ?OBJECTIVE: BP 126/84   Ht '5\' 7"'$  (1.702 m)   BMI 30.07 kg/m?   ?Physical Exam:  Vital signs are reviewed. ?WD WN NAD ?Left knee full extension ?Flexion 30 degrees only. Well healed anterior scar ?Distally NV intact ? ?ASSESSMENT & PLAN: ? ?See problem based charting & AVS for pt instructions. ?UNEQUAL LEG LENGTH ?New temporary insoles with build up given today ?Recommend f/u 6 m ? ?Tobacco abuse, QUIT ?Discontinued smoking ? ?

## 2021-05-15 ENCOUNTER — Ambulatory Visit: Payer: Medicare Other | Admitting: Physical Therapy

## 2021-05-22 ENCOUNTER — Ambulatory Visit: Payer: Medicare Other | Admitting: Physical Therapy

## 2021-05-30 ENCOUNTER — Ambulatory Visit: Payer: Medicare Other | Admitting: Physical Therapy

## 2021-06-06 ENCOUNTER — Ambulatory Visit: Payer: Medicare Other | Attending: Student | Admitting: Physical Therapy

## 2021-06-12 ENCOUNTER — Ambulatory Visit: Payer: Medicare Other | Admitting: Physical Therapy

## 2021-06-13 ENCOUNTER — Ambulatory Visit: Payer: Medicare Other | Admitting: Specialist

## 2021-06-20 ENCOUNTER — Ambulatory Visit: Payer: Medicare Other | Admitting: Physical Therapy

## 2021-06-27 ENCOUNTER — Ambulatory Visit: Payer: Medicare Other | Admitting: Physical Therapy

## 2021-07-01 ENCOUNTER — Ambulatory Visit: Payer: Medicare Other | Admitting: Pulmonary Disease

## 2021-07-02 ENCOUNTER — Encounter: Payer: Self-pay | Admitting: Pulmonary Disease

## 2021-07-02 ENCOUNTER — Ambulatory Visit (INDEPENDENT_AMBULATORY_CARE_PROVIDER_SITE_OTHER): Payer: Medicare Other | Admitting: Pulmonary Disease

## 2021-07-02 VITALS — BP 126/68 | HR 66 | Temp 98.2°F | Ht 67.0 in | Wt 172.0 lb

## 2021-07-02 DIAGNOSIS — J302 Other seasonal allergic rhinitis: Secondary | ICD-10-CM | POA: Diagnosis not present

## 2021-07-02 DIAGNOSIS — J4540 Moderate persistent asthma, uncomplicated: Secondary | ICD-10-CM

## 2021-07-02 DIAGNOSIS — F17210 Nicotine dependence, cigarettes, uncomplicated: Secondary | ICD-10-CM

## 2021-07-02 DIAGNOSIS — Z716 Tobacco abuse counseling: Secondary | ICD-10-CM

## 2021-07-02 MED ORDER — MONTELUKAST SODIUM 10 MG PO TABS: 10.0000 mg | ORAL_TABLET | Freq: Every day | ORAL | 11 refills | Status: AC

## 2021-07-02 MED ORDER — AZELASTINE HCL 0.05 % OP SOLN: 1.0000 [drp] | Freq: Two times a day (BID) | OPHTHALMIC | 2 refills | Status: AC

## 2021-07-02 NOTE — Patient Instructions (Addendum)
Nice to see you ? ?Use new eye drop 1 drop each eye twice a day for itching ? ?Continue the Breo and montelukast pill ? ?Decrease by 1 cigarette each week or two! Cut down to 3 a day on Monday! ? ?RTC in 6 months or sooner as needed ?
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
## 2021-07-04 ENCOUNTER — Ambulatory Visit: Payer: Medicare Other | Admitting: Specialist

## 2021-07-04 ENCOUNTER — Ambulatory Visit: Payer: Medicare Other | Admitting: Physical Therapy

## 2021-07-11 ENCOUNTER — Ambulatory Visit: Payer: Medicare Other | Admitting: Physical Therapy

## 2021-08-07 ENCOUNTER — Other Ambulatory Visit: Payer: Self-pay | Admitting: Specialist

## 2021-08-07 DIAGNOSIS — G8929 Other chronic pain: Secondary | ICD-10-CM

## 2021-08-08 ENCOUNTER — Ambulatory Visit (INDEPENDENT_AMBULATORY_CARE_PROVIDER_SITE_OTHER): Payer: Medicare Other | Admitting: Specialist

## 2021-08-08 ENCOUNTER — Encounter: Payer: Self-pay | Admitting: Specialist

## 2021-08-08 ENCOUNTER — Ambulatory Visit (INDEPENDENT_AMBULATORY_CARE_PROVIDER_SITE_OTHER): Payer: Medicare Other

## 2021-08-08 VITALS — BP 147/76 | HR 76 | Ht 67.0 in | Wt 177.0 lb

## 2021-08-08 DIAGNOSIS — S46912A Strain of unspecified muscle, fascia and tendon at shoulder and upper arm level, left arm, initial encounter: Secondary | ICD-10-CM

## 2021-08-08 DIAGNOSIS — M542 Cervicalgia: Secondary | ICD-10-CM | POA: Diagnosis not present

## 2021-08-08 MED ORDER — CYCLOBENZAPRINE HCL 10 MG PO TABS: 10.0000 mg | ORAL_TABLET | Freq: Three times a day (TID) | ORAL | 0 refills | Status: DC | PRN

## 2021-08-08 MED ORDER — HYDROCODONE-ACETAMINOPHEN 5-325 MG PO TABS: 1.0000 | ORAL_TABLET | ORAL | 0 refills | Status: DC | PRN

## 2021-08-08 MED ORDER — IBUPROFEN 800 MG PO TABS: 800.0000 mg | ORAL_TABLET | Freq: Three times a day (TID) | ORAL | 0 refills | Status: DC | PRN

## 2021-08-08 NOTE — Patient Instructions (Signed)
Avoid overhead lifting and overhead use of the arms. Do not lift greater than 10 lbs. Hydrocodone  one every 4-5 hours for pain and inflamation. Motrin 800 mg every 8 hours with meal or snack for joint and muscle pain Flexeril for muscle spasm Ice locally for 30-40 min at a time 3-4 times a day  Out of work till you are seen back in one week on Wednesday. Sling left shoulder.

## 2021-08-15 ENCOUNTER — Encounter: Payer: Self-pay | Admitting: Specialist

## 2021-08-15 ENCOUNTER — Ambulatory Visit (INDEPENDENT_AMBULATORY_CARE_PROVIDER_SITE_OTHER): Payer: Medicare Other | Admitting: Specialist

## 2021-08-15 VITALS — BP 102/68 | Ht 67.0 in | Wt 177.0 lb

## 2021-08-15 DIAGNOSIS — R200 Anesthesia of skin: Secondary | ICD-10-CM | POA: Diagnosis not present

## 2021-08-15 DIAGNOSIS — M6281 Muscle weakness (generalized): Secondary | ICD-10-CM

## 2021-08-15 DIAGNOSIS — R202 Paresthesia of skin: Secondary | ICD-10-CM

## 2021-08-15 DIAGNOSIS — S46912A Strain of unspecified muscle, fascia and tendon at shoulder and upper arm level, left arm, initial encounter: Secondary | ICD-10-CM

## 2021-08-15 DIAGNOSIS — M542 Cervicalgia: Secondary | ICD-10-CM | POA: Diagnosis not present

## 2021-08-15 DIAGNOSIS — M5412 Radiculopathy, cervical region: Secondary | ICD-10-CM

## 2021-08-15 MED ORDER — OXYCODONE-ACETAMINOPHEN 5-325 MG PO TABS: 1.0000 | ORAL_TABLET | ORAL | 0 refills | Status: DC | PRN
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
## 2021-08-17 ENCOUNTER — Ambulatory Visit
Admission: RE | Admit: 2021-08-17 | Discharge: 2021-08-17 | Disposition: A | Discharge status: Home / Self Care | Payer: Medicare Other | Source: Ambulatory Visit | Attending: Specialist | Admitting: Specialist

## 2021-08-17 DIAGNOSIS — R200 Anesthesia of skin: Secondary | ICD-10-CM

## 2021-08-17 DIAGNOSIS — S46912A Strain of unspecified muscle, fascia and tendon at shoulder and upper arm level, left arm, initial encounter: Secondary | ICD-10-CM

## 2021-08-17 DIAGNOSIS — M6281 Muscle weakness (generalized): Secondary | ICD-10-CM

## 2021-08-17 DIAGNOSIS — M542 Cervicalgia: Secondary | ICD-10-CM

## 2021-08-17 DIAGNOSIS — R202 Paresthesia of skin: Secondary | ICD-10-CM

## 2021-09-02 ENCOUNTER — Encounter: Payer: Self-pay | Admitting: Specialist

## 2021-09-02 ENCOUNTER — Ambulatory Visit (INDEPENDENT_AMBULATORY_CARE_PROVIDER_SITE_OTHER): Payer: Medicare Other | Admitting: Specialist

## 2021-09-02 VITALS — BP 115/74 | HR 75 | Ht 67.0 in | Wt 177.0 lb

## 2021-09-02 DIAGNOSIS — Z981 Arthrodesis status: Secondary | ICD-10-CM

## 2021-09-02 DIAGNOSIS — M5412 Radiculopathy, cervical region: Secondary | ICD-10-CM | POA: Diagnosis not present

## 2021-09-02 DIAGNOSIS — S46912A Strain of unspecified muscle, fascia and tendon at shoulder and upper arm level, left arm, initial encounter: Secondary | ICD-10-CM | POA: Diagnosis not present

## 2021-09-02 DIAGNOSIS — M4712 Other spondylosis with myelopathy, cervical region: Secondary | ICD-10-CM | POA: Diagnosis not present
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
## 2021-09-04 ENCOUNTER — Encounter: Payer: Self-pay | Admitting: Specialist

## 2021-09-11 ENCOUNTER — Telehealth: Payer: Self-pay

## 2021-09-11 NOTE — Telephone Encounter (Signed)
Surgery sheet done 07/19.  I sent for clearance.  Dr. Louanne Skye will not be able to do the surgery by 08/01.  How does Dr. Louanne Skye want to proceed with this case?  Wife is calling to find out status.

## 2021-09-26 ENCOUNTER — Ambulatory Visit: Payer: Medicare Other | Admitting: Specialist

## 2021-10-01 ENCOUNTER — Telehealth: Payer: Self-pay | Admitting: *Deleted

## 2021-10-01 NOTE — Telephone Encounter (Signed)
   Pre-operative Risk Assessment    Patient Name: Gregory [name]  DOB: 1961/01/13 MRN: [id number]      Request for Surgical Clearance    Procedure:   CERVICAL FORAMINOTOMIES   Date of Surgery:  Clearance TBD                                 Surgeon:  DR. [name] Surgeon's Group or Practice Name:  [facility] AT [facility] Phone number:  [phone] Fax number:  [phone] ATTN: SHERRIE   Type of Clearance Requested:   - Medical ; NO MEDICATIONS ARE LISTED AS NEEDING TO BE HELD   Type of Anesthesia:  General    Additional requests/questions:    Jiles Prows   10/01/2021, 1:40 PM

## 2021-10-01 NOTE — Telephone Encounter (Signed)
   Name: Gregory [name]  DOB: [birth year]  MRN: [id number]  Primary Cardiologist: None  Chart reviewed as part of pre-operative protocol coverage. Because of [name]'s past medical history and time since last visit, he will require a follow-up in-office visit in order to better assess preoperative cardiovascular risk.  Pre-op covering staff: - Please schedule appointment and call patient to inform them. If patient already had an upcoming appointment within acceptable timeframe, please add "pre-op clearance" to the appointment notes so provider is aware. - Please contact requesting surgeon's office via preferred method (i.e, phone, fax) to inform them of need for appointment prior to surgery.   Lenna Sciara, NP  10/01/2021, 4:23 PM

## 2021-10-01 NOTE — Telephone Encounter (Signed)
Our office received a referral for new pt appt for pre op clearance and abnormal EKG. I reviewed the notes sent to our office though did not see any clearance notes.   I saw in epic that the pt saw Dr. [name] in 08/2021. I reached out to surgery scheduler Almedia Balls by secure chat asking if Dr. Louanne Skye was doing surgery on this pt and if not I will then reach out to the pt. I will wait to hear back from [facility] before calling the pt.

## 2021-10-02 NOTE — Telephone Encounter (Signed)
Pt has new pt appt with DR. Phineas Inches 10/08/21
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
## 2021-10-08 ENCOUNTER — Other Ambulatory Visit: Payer: Self-pay | Admitting: Orthopaedic Surgery

## 2021-10-08 ENCOUNTER — Ambulatory Visit (INDEPENDENT_AMBULATORY_CARE_PROVIDER_SITE_OTHER): Payer: Medicare Other | Admitting: Internal Medicine

## 2021-10-08 ENCOUNTER — Encounter: Payer: Self-pay | Admitting: Internal Medicine

## 2021-10-08 VITALS — BP 98/68 | HR 69 | Ht 67.0 in | Wt 179.2 lb

## 2021-10-08 DIAGNOSIS — Z01810 Encounter for preprocedural cardiovascular examination: Secondary | ICD-10-CM

## 2021-10-08 NOTE — Patient Instructions (Signed)

## 2021-10-22 ENCOUNTER — Other Ambulatory Visit: Payer: Self-pay

## 2021-10-23 ENCOUNTER — Telehealth: Payer: Self-pay | Admitting: Pulmonary Disease

## 2021-10-23 NOTE — Telephone Encounter (Signed)
OV notes and clearance form have been faxed back to [facility]. Nothing further needed at this time.

## 2021-10-23 NOTE — Telephone Encounter (Signed)
Pulmonary Medicine does not provide pulmonary clearance but rather a pre-operative risk evaluation. Based on ARISCAT model, patient is low or 1.3% risk of post operative pulmonary complication if duration of surgery is less than 3 hours or intermediate or 13.3% risk of postoperative pulmonary complications duration of surgery is greater than 3.  There are no modifiable risk factors to address prior to surgery.  Please see below for recommendations: --Please provide DuoNebs in the preoperative area and PACU --Check intraoperative blood gas and if retaining CO2 extubate to BiPAP

## 2021-10-23 NOTE — Telephone Encounter (Signed)
Fax received from Dr. Lorin Mercy with Gregory [name] at [facility] to perform a Cervical Foraminotomies on patient.  Patient needs surgery clearance. Surgery is 11/01/2021. Patient was seen on 07/02/2021. Office protocol is a risk assessment can be sent to surgeon if patient has been seen in 60 days or less.   Sending to Dr Silas Flood for risk assessment or recommendations if patient needs to be seen in office prior to surgical procedure.

## 2021-10-24 ENCOUNTER — Ambulatory Visit (INDEPENDENT_AMBULATORY_CARE_PROVIDER_SITE_OTHER): Payer: Medicare Other | Admitting: Surgery

## 2021-10-24 ENCOUNTER — Encounter: Payer: Self-pay | Admitting: Surgery

## 2021-10-24 VITALS — BP 138/77 | HR 66 | Ht 67.0 in | Wt 179.2 lb

## 2021-10-24 DIAGNOSIS — M5412 Radiculopathy, cervical region: Secondary | ICD-10-CM

## 2021-10-24 DIAGNOSIS — M7542 Impingement syndrome of left shoulder: Secondary | ICD-10-CM | POA: Diagnosis not present

## 2021-10-24 DIAGNOSIS — M4712 Other spondylosis with myelopathy, cervical region: Secondary | ICD-10-CM

## 2021-10-24 MED ORDER — METHYLPREDNISOLONE ACETATE 40 MG/ML IJ SUSP
40.0000 mg | INTRAMUSCULAR | Status: AC | PRN
  Administered 2021-10-24: 40 mg via INTRA_ARTICULAR

## 2021-10-24 MED ORDER — LIDOCAINE HCL 1 % IJ SOLN
3.0000 mL | INTRAMUSCULAR | Status: AC | PRN
  Administered 2021-10-24: 3 mL

## 2021-10-24 MED ORDER — BUPIVACAINE HCL 0.5 % IJ SOLN
6.0000 mL | INTRAMUSCULAR | Status: AC | PRN
  Administered 2021-10-24: 6 mL via INTRA_ARTICULAR
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
## 2021-10-25 NOTE — Pre-Procedure Instructions (Signed)
Surgical Instructions    Your procedure is scheduled on Friday, September 15.  Report to [facility] Main Entrance "A" at 5:30 A.M., then check in with the Admitting office.  Call this number if you have problems the morning of surgery:  [phone]   If you have any questions prior to your surgery date call [phone]: Open Monday-Friday 8am-4pm    Remember:  Do not eat after midnight the night before your surgery  You may drink clear liquids until 4:30AM the morning of your surgery.   Clear liquids allowed are: Water, Non-Citrus Juices (without pulp), Carbonated Beverages, Clear Tea, Black Coffee ONLY (NO MILK, CREAM OR POWDERED CREAMER of any kind), and Gatorade  Patient Instructions  The night before surgery:  No food after midnight. ONLY clear liquids after midnight  The day of surgery (if you do NOT have diabetes):  Drink ONE (1) Pre-Surgery Clear Ensure by 4:30AM the morning of surgery. Drink in one sitting. Do not sip.  This drink was given to you during your hospital  pre-op appointment visit.  Nothing else to drink after completing the  Pre-Surgery Clear Ensure.           If you have questions, please contact your surgeon's office.     Take these medicines the morning of surgery with A SIP OF WATER:   amLODipine (NORVASC)  dexlansoprazole (DEXILANT) FLUoxetine (PROZAC) topiramate (TOPAMAX) albuterol (VENTOLIN HFA) 108 (90 Base)  if needed fluticasone furoate-vilanterol (BREO ELLIPTA)  if needed cetirizine (ZYRTEC) if needed fluticasone (FLONASE)  if needed traMADol (ULTRAM) if needed  Please bring all inhalers with you the day of surgery.    As of today, STOP taking any Aspirin (unless otherwise instructed by your surgeon), diclofenac Sodium (VOLTAREN) 1 % GEL, Aleve, Naproxen, Ibuprofen, Motrin, Advil, Goody's, BC's, all herbal medications, fish oil, and all vitamins.   [place] is not responsible for any belongings or valuables.    Do NOT Smoke  (Tobacco/Vaping)  24 hours prior to your procedure  If you use a CPAP at night, you may bring your mask for your overnight stay.   Contacts, glasses, hearing aids, dentures or partials may not be worn into surgery, please bring cases for these belongings   For patients admitted to the hospital, discharge time will be determined by your treatment team.   Patients discharged the day of surgery will not be allowed to drive home, and someone needs to stay with them for 24 hours.   SURGICAL WAITING ROOM VISITATION Patients having surgery or a procedure may have no more than 2 support people in the waiting area - these visitors may rotate.   Children under the age of 80 must have an adult with them who is not the patient. If the patient needs to stay at the hospital during part of their recovery, the visitor guidelines for inpatient rooms apply. Pre-op nurse will coordinate an appropriate time for 1 support person to accompany patient in pre-op.  This support person may not rotate.   Please refer to the [facility] website for the visitor guidelines for Inpatients (after your surgery is over and you are in a regular room).    Special instructions:    Oral Hygiene is also important to reduce your risk of infection.  Remember - BRUSH YOUR TEETH THE MORNING OF SURGERY WITH YOUR REGULAR TOOTHPASTE   [facility]- Preparing For Surgery  Before surgery, you can play an important role. Because skin is not sterile, your skin needs to  be as free of germs as possible. You can reduce the number of germs on your skin by washing with CHG (chlorahexidine gluconate) Soap before surgery.  CHG is an antiseptic cleaner which kills germs and bonds with the skin to continue killing germs even after washing.     Please do not use if you have an allergy to CHG or antibacterial soaps. If your skin becomes reddened/irritated stop using the CHG.  Do not shave (including legs and underarms) for at least 48 hours prior  to first CHG shower. It is OK to shave your face.  Please follow these instructions carefully.     Shower the NIGHT BEFORE SURGERY and the MORNING OF SURGERY with CHG Soap.   If you chose to wash your hair, wash your hair first as usual with your normal shampoo. After you shampoo, rinse your hair and body thoroughly to remove the shampoo.  Then [employer] and genitals (private parts) with your normal soap and rinse thoroughly to remove soap.  After that Use CHG Soap as you would any other liquid soap. You can apply CHG directly to the skin and wash gently with a scrungie or a clean washcloth.   Apply the CHG Soap to your body ONLY FROM THE NECK DOWN.  Do not use on open wounds or open sores. Avoid contact with your eyes, ears, mouth and genitals (private parts). Wash Face and genitals (private parts)  with your normal soap.   Wash thoroughly, paying special attention to the area where your surgery will be performed.  Thoroughly rinse your body with warm water from the neck down.  DO NOT shower/wash with your normal soap after using and rinsing off the CHG Soap.  Pat yourself dry with a CLEAN TOWEL.  Wear CLEAN PAJAMAS to bed the night before surgery  Place CLEAN SHEETS on your bed the night before your surgery  DO NOT SLEEP WITH PETS.   Day of Surgery:  Take a shower with CHG soap. Wear Clean/Comfortable clothing the morning of surgery Do not wear jewelry or makeup. Do not wear lotions, powders, perfumes/cologne or deodorant. Do not shave 48 hours prior to surgery.  Men may shave face and neck. Do not bring valuables to the hospital. Do not wear nail polish, gel polish, artificial nails, or any other type of covering on natural nails (fingers and toes) If you have artificial nails or gel coating that need to be removed by a nail salon, please have this removed prior to surgery. Artificial nails or gel coating may interfere with anesthesia's ability to adequately monitor your vital  signs. Remember to brush your teeth WITH YOUR REGULAR TOOTHPASTE.    If you received a COVID test during your pre-op visit, it is requested that you wear a mask when out in public, stay away from anyone that may not be feeling well, and notify your surgeon if you develop symptoms. If you have been in contact with anyone that has tested positive in the last 10 days, please notify your surgeon.    Please read over the following fact sheets that you were given.

## 2021-10-28 ENCOUNTER — Encounter (HOSPITAL_COMMUNITY)
Admission: RE | Admit: 2021-10-28 | Discharge: 2021-10-28 | Disposition: A | Discharge status: Home / Self Care | Payer: Medicare Other | Source: Ambulatory Visit | Attending: Orthopaedic Surgery | Admitting: Orthopaedic Surgery

## 2021-10-28 ENCOUNTER — Other Ambulatory Visit: Payer: Self-pay

## 2021-10-28 ENCOUNTER — Encounter (HOSPITAL_COMMUNITY): Payer: Self-pay

## 2021-10-28 VITALS — BP 129/78 | HR 64 | Temp 98.2°F | Resp 17 | Ht 67.0 in | Wt 178.9 lb

## 2021-10-28 DIAGNOSIS — G4733 Obstructive sleep apnea (adult) (pediatric): Secondary | ICD-10-CM | POA: Diagnosis not present

## 2021-10-28 DIAGNOSIS — Z01812 Encounter for preprocedural laboratory examination: Secondary | ICD-10-CM | POA: Insufficient documentation

## 2021-10-28 DIAGNOSIS — I10 Essential (primary) hypertension: Secondary | ICD-10-CM | POA: Diagnosis not present

## 2021-10-28 DIAGNOSIS — Z01818 Encounter for other preprocedural examination: Secondary | ICD-10-CM

## 2021-10-28 LAB — CBC
HCT: 41.1 % (ref 39.0–52.0)
Hemoglobin: 13.4 g/dL (ref 13.0–17.0)
MCH: 29.9 pg (ref 26.0–34.0)
MCHC: 32.6 g/dL (ref 30.0–36.0)
MCV: 91.7 fL (ref 80.0–100.0)
Platelets: 305 10*3/uL (ref 150–400)
RBC: 4.48 MIL/uL (ref 4.22–5.81)
RDW: 12.8 % (ref 11.5–15.5)
WBC: 7.7 10*3/uL (ref 4.0–10.5)
nRBC: 0 % (ref 0.0–0.2)

## 2021-10-28 LAB — BASIC METABOLIC PANEL
Anion gap: 7 (ref 5–15)
BUN: 9 mg/dL (ref 8–23)
CO2: 26 mmol/L (ref 22–32)
Calcium: 9.5 mg/dL (ref 8.9–10.3)
Chloride: 107 mmol/L (ref 98–111)
Creatinine, Ser: 1.09 mg/dL (ref 0.61–1.24)
GFR, Estimated: >60 mL/min (ref >60)
Glucose, Bld: 94 mg/dL (ref 70–99)
Potassium: 4.5 mmol/L (ref 3.5–5.1)
Sodium: 140 mmol/L (ref 135–145)

## 2021-10-28 LAB — SURGICAL PCR SCREEN
MRSA, PCR: NEGATIVE
Staphylococcus aureus: NEGATIVE

## 2021-10-28 LAB — NO BLOOD PRODUCTS

## 2021-10-28 NOTE — Progress Notes (Signed)
PCP - Cipriano Mile NP Cardiologist - Dr. Phineas Inches  PPM/ICD - Denies  Chest x-ray - N/A EKG - 10/08/21 Stress Test - 03/09/09 ECHO - 12/12/19 Cardiac Cath - Denies  Sleep Study - Yes, OSA CPAP - No  Diabetes: Denies  Blood Thinner Instructions: N/A Aspirin Instructions: N/A  ERAS Protcol - Yes, PRE-SURGERY Ensure   COVID TEST- N/A   Anesthesia review: Yes, cardiac hx  Patient denies shortness of breath, fever, cough and chest pain at PAT appointment   All instructions explained to the patient, with a verbal understanding of the material. Patient agrees to go over the instructions while at home for a better understanding. Patient also instructed to self quarantine after being tested for COVID-19. The opportunity to ask questions was provided.

## 2021-10-29 NOTE — Progress Notes (Signed)
Anesthesia Chart Review:  Follows with cardiology for history of HTN and OSA intolerant to CPAP.  Echo 11/2019 showed normal biventricular function, no significant valvular abnormalities.  Seen by Dr. Phineas Inches 10/09/2018 for preop restratification.  Per note, "Pre-Op Assessment: He has no cardiac dx hx. He can complete > 4 METS without symptoms. Based on his NSQIP risk assessment, risk of serious complications is 2.2% which is below the average risk. He is acceptable cardiac risk for cervical foraminotomies."  History of COPD, maintained on Breo Ellipta and as needed albuterol.  Patient is Jehovah's Witness, refuses blood transfusion.  Preop labs reviewed, WNL.  EKG 10/09/2021: NSR. Rate 69. Moderate voltage criteria for LVH, may be normal variant.   TTE 12/12/2019:  1. Left ventricular ejection fraction, by estimation, is 65 to 70%. The  left ventricle has normal function. The left ventricle has no regional  wall motion abnormalities. Left ventricular diastolic parameters were  normal. The average left ventricular  global longitudinal strain is -25.1 %. The global longitudinal strain is  normal.   2. Right ventricular systolic function is normal. The right ventricular  size is normal.   3. The mitral valve is normal in structure. Trivial mitral valve  regurgitation. No evidence of mitral stenosis.   4. The aortic valve is normal in structure. Aortic valve regurgitation is  not visualized. No aortic stenosis is present.   5. The inferior vena cava is normal in size with greater than 50%  respiratory variability, suggesting right atrial pressure of 3 mmHg.     Wynonia Musty [facility] Short Stay Center/Anesthesiology Phone [phone] 10/29/2021 1:25 PM

## 2021-10-29 NOTE — Anesthesia Preprocedure Evaluation (Addendum)
Anesthesia Evaluation  Patient identified by MRN, date of birth, ID band Patient awake    Reviewed: Allergy & Precautions, NPO status , Patient's Chart, lab work & pertinent test results  History of Anesthesia Complications Negative for: history of anesthetic complications  Airway Mallampati: II  TM Distance: >3 FB Neck ROM: Full    Dental  (+) Dental Advisory Given, Upper Dentures, Partial Lower   Pulmonary asthma , sleep apnea , COPD,  COPD inhaler, former smoker,    Pulmonary exam normal        Cardiovascular hypertension, Pt. on medications Normal cardiovascular exam     Neuro/Psych  Headaches, PSYCHIATRIC DISORDERS Anxiety Depression Bipolar Disorder    GI/Hepatic Neg liver ROS, GERD  Controlled and Medicated,  Endo/Other  negative endocrine ROS  Renal/GU negative Renal ROS     Musculoskeletal  (+) Arthritis ,   Abdominal   Peds  Hematology  (+) REFUSES BLOOD PRODUCTS, JEHOVAH'S WITNESS  Anesthesia Other Findings   Reproductive/Obstetrics                           Anesthesia Physical Anesthesia Plan  ASA: 3  Anesthesia Plan: General   Post-op Pain Management: Tylenol PO (pre-op)* and Celebrex PO (pre-op)*   Induction: Intravenous  PONV Risk Score and Plan: 2 and Treatment may vary due to age or medical condition, Ondansetron, Dexamethasone and Midazolam  Airway Management Planned: Oral ETT  Additional Equipment: None  Intra-op Plan:   Post-operative Plan: Extubation in OR  Informed Consent: I have reviewed the patients History and Physical, chart, labs and discussed the procedure including the risks, benefits and alternatives for the proposed anesthesia with the patient or authorized representative who has indicated his/her understanding and acceptance.     Dental advisory given  Plan Discussed with: CRNA and Anesthesiologist  Anesthesia Plan Comments:        Anesthesia Quick Evaluation

## 2021-11-01 ENCOUNTER — Ambulatory Visit (HOSPITAL_COMMUNITY): Payer: Medicare Other | Admitting: Physician Assistant

## 2021-11-01 ENCOUNTER — Encounter (HOSPITAL_COMMUNITY): Payer: Self-pay | Admitting: Orthopaedic Surgery

## 2021-11-01 ENCOUNTER — Other Ambulatory Visit: Payer: Self-pay

## 2021-11-01 ENCOUNTER — Ambulatory Visit (HOSPITAL_BASED_OUTPATIENT_CLINIC_OR_DEPARTMENT_OTHER): Payer: Medicare Other | Admitting: Certified Registered Nurse Anesthetist

## 2021-11-01 ENCOUNTER — Observation Stay (HOSPITAL_COMMUNITY)
Admission: RE | Admit: 2021-11-01 | Discharge: 2021-11-02 | Disposition: A | Discharge status: Home / Self Care | Payer: Medicare Other | Attending: Orthopaedic Surgery | Admitting: Orthopaedic Surgery

## 2021-11-01 ENCOUNTER — Ambulatory Visit (HOSPITAL_COMMUNITY): Payer: Medicare Other

## 2021-11-01 ENCOUNTER — Encounter (HOSPITAL_COMMUNITY)
Admission: RE | Disposition: A | Discharge status: Home / Self Care | Payer: Self-pay | Source: Home / Self Care | Attending: Orthopaedic Surgery

## 2021-11-01 DIAGNOSIS — M7542 Impingement syndrome of left shoulder: Secondary | ICD-10-CM | POA: Diagnosis not present

## 2021-11-01 DIAGNOSIS — I10 Essential (primary) hypertension: Secondary | ICD-10-CM

## 2021-11-01 DIAGNOSIS — G4730 Sleep apnea, unspecified: Secondary | ICD-10-CM

## 2021-11-01 DIAGNOSIS — F17210 Nicotine dependence, cigarettes, uncomplicated: Secondary | ICD-10-CM | POA: Diagnosis not present

## 2021-11-01 DIAGNOSIS — F418 Other specified anxiety disorders: Secondary | ICD-10-CM | POA: Diagnosis not present

## 2021-11-01 DIAGNOSIS — M4712 Other spondylosis with myelopathy, cervical region: Secondary | ICD-10-CM | POA: Diagnosis not present

## 2021-11-01 DIAGNOSIS — Z96652 Presence of left artificial knee joint: Secondary | ICD-10-CM | POA: Insufficient documentation

## 2021-11-01 DIAGNOSIS — M4802 Spinal stenosis, cervical region: Secondary | ICD-10-CM | POA: Diagnosis not present

## 2021-11-01 DIAGNOSIS — M4723 Other spondylosis with radiculopathy, cervicothoracic region: Secondary | ICD-10-CM | POA: Diagnosis present

## 2021-11-01 DIAGNOSIS — J449 Chronic obstructive pulmonary disease, unspecified: Secondary | ICD-10-CM | POA: Diagnosis not present

## 2021-11-01 DIAGNOSIS — Z01818 Encounter for other preprocedural examination: Secondary | ICD-10-CM

## 2021-11-01 DIAGNOSIS — Z87891 Personal history of nicotine dependence: Secondary | ICD-10-CM

## 2021-11-01 DIAGNOSIS — M4722 Other spondylosis with radiculopathy, cervical region: Secondary | ICD-10-CM | POA: Diagnosis not present

## 2021-11-01 HISTORY — PX: POSTERIOR CERVICAL FUSION/FORAMINOTOMY: SHX5038

## 2021-11-01 SURGERY — POSTERIOR CERVICAL FUSION/FORAMINOTOMY LEVEL 2
Anesthesia: General | Site: Spine Cervical | Laterality: Left

## 2021-11-01 MED ORDER — FENTANYL CITRATE (PF) 100 MCG/2ML IJ SOLN
INTRAMUSCULAR | Status: AC
  Filled 2021-11-01: qty 2

## 2021-11-01 MED ORDER — SODIUM CHLORIDE 0.9 % IV SOLN: 250.0000 mL | INTRAVENOUS | Status: DC

## 2021-11-01 MED ORDER — LACTATED RINGERS IV SOLN: INTRAVENOUS | Status: DC

## 2021-11-01 MED ORDER — BUPIVACAINE-EPINEPHRINE 0.5% -1:200000 IJ SOLN
INTRAMUSCULAR | Status: DC | PRN
  Administered 2021-11-01: 5 mL
  Administered 2021-11-01: 4 mL

## 2021-11-01 MED ORDER — OLANZAPINE 2.5 MG PO TABS
2.5000 mg | ORAL_TABLET | Freq: Every day | ORAL | Status: DC
  Administered 2021-11-01: 2.5 mg via ORAL
  Filled 2021-11-01: qty 1

## 2021-11-01 MED ORDER — LORATADINE 10 MG PO TABS
10.0000 mg | ORAL_TABLET | Freq: Every day | ORAL | Status: DC
  Administered 2021-11-01: 10 mg via ORAL
  Filled 2021-11-01: qty 1

## 2021-11-01 MED ORDER — EPHEDRINE SULFATE-NACL 50-0.9 MG/10ML-% IV SOSY
PREFILLED_SYRINGE | INTRAVENOUS | Status: DC | PRN
  Administered 2021-11-01 (×2): 5 mg via INTRAVENOUS

## 2021-11-01 MED ORDER — THROMBIN 20000 UNITS EX SOLR
CUTANEOUS | Status: DC | PRN
  Administered 2021-11-01: 20 mL

## 2021-11-01 MED ORDER — CHLORHEXIDINE GLUCONATE 0.12 % MT SOLN
15.0000 mL | Freq: Once | OROMUCOSAL | Status: AC
  Administered 2021-11-01: 15 mL via OROMUCOSAL
  Filled 2021-11-01: qty 15

## 2021-11-01 MED ORDER — ONDANSETRON HCL 4 MG/2ML IJ SOLN
INTRAMUSCULAR | Status: AC
  Filled 2021-11-01: qty 2

## 2021-11-01 MED ORDER — PHENOL 1.4 % MT LIQD: 1.0000 | OROMUCOSAL | Status: DC | PRN

## 2021-11-01 MED ORDER — SODIUM CHLORIDE 0.45 % IV SOLN: INTRAVENOUS | Status: DC

## 2021-11-01 MED ORDER — BUPIVACAINE-EPINEPHRINE (PF) 0.5% -1:200000 IJ SOLN
INTRAMUSCULAR | Status: AC
  Filled 2021-11-01: qty 30

## 2021-11-01 MED ORDER — METHOCARBAMOL 1000 MG/10ML IJ SOLN: 500.0000 mg | Freq: Four times a day (QID) | INTRAVENOUS | Status: DC | PRN

## 2021-11-01 MED ORDER — SODIUM CHLORIDE 0.9% FLUSH: 3.0000 mL | INTRAVENOUS | Status: DC | PRN

## 2021-11-01 MED ORDER — DEXAMETHASONE SODIUM PHOSPHATE 10 MG/ML IJ SOLN
INTRAMUSCULAR | Status: DC | PRN
  Administered 2021-11-01: 10 mg via INTRAVENOUS

## 2021-11-01 MED ORDER — CEFAZOLIN SODIUM-DEXTROSE 2-4 GM/100ML-% IV SOLN
2.0000 g | INTRAVENOUS | Status: AC
  Administered 2021-11-01: 2 g via INTRAVENOUS
  Filled 2021-11-01: qty 100

## 2021-11-01 MED ORDER — ROCURONIUM BROMIDE 10 MG/ML (PF) SYRINGE
PREFILLED_SYRINGE | INTRAVENOUS | Status: DC | PRN
  Administered 2021-11-01: 50 mg via INTRAVENOUS
  Administered 2021-11-01: 30 mg via INTRAVENOUS
  Administered 2021-11-01 (×2): 20 mg via INTRAVENOUS

## 2021-11-01 MED ORDER — MIDAZOLAM HCL 2 MG/2ML IJ SOLN
INTRAMUSCULAR | Status: DC | PRN
  Administered 2021-11-01: 2 mg via INTRAVENOUS

## 2021-11-01 MED ORDER — BUPIVACAINE LIPOSOME 1.3 % IJ SUSP
INTRAMUSCULAR | Status: AC
  Filled 2021-11-01: qty 20

## 2021-11-01 MED ORDER — LIDOCAINE 2% (20 MG/ML) 5 ML SYRINGE
INTRAMUSCULAR | Status: DC | PRN
  Administered 2021-11-01: 60 mg via INTRAVENOUS

## 2021-11-01 MED ORDER — ALBUTEROL SULFATE HFA 108 (90 BASE) MCG/ACT IN AERS: 2.0000 | INHALATION_SPRAY | Freq: Four times a day (QID) | RESPIRATORY_TRACT | Status: DC | PRN

## 2021-11-01 MED ORDER — TOPIRAMATE 100 MG PO TABS
200.0000 mg | ORAL_TABLET | Freq: Two times a day (BID) | ORAL | Status: DC
  Administered 2021-11-01 (×2): 200 mg via ORAL
  Filled 2021-11-01 (×2): qty 2

## 2021-11-01 MED ORDER — MONTELUKAST SODIUM 10 MG PO TABS
10.0000 mg | ORAL_TABLET | Freq: Every day | ORAL | Status: DC
  Administered 2021-11-01: 10 mg via ORAL
  Filled 2021-11-01: qty 1

## 2021-11-01 MED ORDER — ALBUTEROL SULFATE (2.5 MG/3ML) 0.083% IN NEBU: 3.0000 mL | INHALATION_SOLUTION | Freq: Four times a day (QID) | RESPIRATORY_TRACT | Status: DC | PRN

## 2021-11-01 MED ORDER — CEFAZOLIN SODIUM-DEXTROSE 1-4 GM/50ML-% IV SOLN
1.0000 g | Freq: Three times a day (TID) | INTRAVENOUS | Status: AC
  Administered 2021-11-01 (×2): 1 g via INTRAVENOUS
  Filled 2021-11-01 (×2): qty 50

## 2021-11-01 MED ORDER — ONDANSETRON HCL 4 MG/2ML IJ SOLN: 4.0000 mg | Freq: Four times a day (QID) | INTRAMUSCULAR | Status: DC | PRN

## 2021-11-01 MED ORDER — TRAMADOL HCL 50 MG PO TABS
50.0000 mg | ORAL_TABLET | Freq: Four times a day (QID) | ORAL | Status: DC | PRN
  Administered 2021-11-01: 50 mg via ORAL
  Filled 2021-11-01: qty 1

## 2021-11-01 MED ORDER — THIAMINE HCL 100 MG PO TABS
100.0000 mg | ORAL_TABLET | Freq: Every day | ORAL | Status: DC
  Filled 2021-11-01 (×2): qty 1

## 2021-11-01 MED ORDER — PROPOFOL 10 MG/ML IV BOLUS
INTRAVENOUS | Status: AC
  Filled 2021-11-01: qty 20

## 2021-11-01 MED ORDER — PHENYLEPHRINE HCL-NACL 20-0.9 MG/250ML-% IV SOLN
INTRAVENOUS | Status: DC | PRN
  Administered 2021-11-01: 20 ug/min via INTRAVENOUS

## 2021-11-01 MED ORDER — ORAL CARE MOUTH RINSE: 15.0000 mL | Freq: Once | OROMUCOSAL | Status: AC

## 2021-11-01 MED ORDER — ONDANSETRON HCL 4 MG PO TABS: 4.0000 mg | ORAL_TABLET | Freq: Four times a day (QID) | ORAL | Status: DC | PRN

## 2021-11-01 MED ORDER — KETAMINE HCL 50 MG/5ML IJ SOSY
PREFILLED_SYRINGE | INTRAMUSCULAR | Status: AC
  Filled 2021-11-01: qty 5

## 2021-11-01 MED ORDER — SUGAMMADEX SODIUM 200 MG/2ML IV SOLN
INTRAVENOUS | Status: DC | PRN
  Administered 2021-11-01: 200 mg via INTRAVENOUS

## 2021-11-01 MED ORDER — KETAMINE HCL 10 MG/ML IJ SOLN
INTRAMUSCULAR | Status: DC | PRN
  Administered 2021-11-01: 30 mg via INTRAVENOUS
  Administered 2021-11-01: 10 mg via INTRAVENOUS

## 2021-11-01 MED ORDER — ADULT MULTIVITAMIN W/MINERALS CH
1.0000 | ORAL_TABLET | Freq: Every day | ORAL | Status: DC
  Administered 2021-11-01: 1 via ORAL
  Filled 2021-11-01: qty 1

## 2021-11-01 MED ORDER — CELECOXIB 200 MG PO CAPS
200.0000 mg | ORAL_CAPSULE | Freq: Two times a day (BID) | ORAL | Status: DC
  Administered 2021-11-01 (×2): 200 mg via ORAL
  Filled 2021-11-01 (×2): qty 1

## 2021-11-01 MED ORDER — PROMETHAZINE HCL 25 MG/ML IJ SOLN: 6.2500 mg | INTRAMUSCULAR | Status: DC | PRN

## 2021-11-01 MED ORDER — AMLODIPINE BESYLATE 5 MG PO TABS
10.0000 mg | ORAL_TABLET | Freq: Every day | ORAL | Status: DC
  Administered 2021-11-01: 10 mg via ORAL
  Filled 2021-11-01: qty 2

## 2021-11-01 MED ORDER — FENTANYL CITRATE (PF) 250 MCG/5ML IJ SOLN
INTRAMUSCULAR | Status: DC | PRN
  Administered 2021-11-01: 50 ug via INTRAVENOUS
  Administered 2021-11-01: 100 ug via INTRAVENOUS

## 2021-11-01 MED ORDER — LOSARTAN POTASSIUM 50 MG PO TABS
100.0000 mg | ORAL_TABLET | Freq: Every day | ORAL | Status: DC
  Administered 2021-11-01: 100 mg via ORAL
  Filled 2021-11-01: qty 2

## 2021-11-01 MED ORDER — TRAZODONE HCL 50 MG PO TABS: 50.0000 mg | ORAL_TABLET | Freq: Every evening | ORAL | Status: DC | PRN

## 2021-11-01 MED ORDER — CYCLOBENZAPRINE HCL 10 MG PO TABS: 10.0000 mg | ORAL_TABLET | Freq: Three times a day (TID) | ORAL | Status: DC | PRN

## 2021-11-01 MED ORDER — OXYCODONE HCL 5 MG PO TABS: 5.0000 mg | ORAL_TABLET | ORAL | Status: DC | PRN

## 2021-11-01 MED ORDER — 0.9 % SODIUM CHLORIDE (POUR BTL) OPTIME
TOPICAL | Status: DC | PRN
  Administered 2021-11-01: 1000 mL

## 2021-11-01 MED ORDER — OXYCODONE HCL 5 MG PO TABS
10.0000 mg | ORAL_TABLET | ORAL | Status: DC | PRN
  Administered 2021-11-01: 10 mg via ORAL
  Filled 2021-11-01: qty 2

## 2021-11-01 MED ORDER — THROMBIN 20000 UNITS EX SOLR
CUTANEOUS | Status: AC
  Filled 2021-11-01: qty 20000

## 2021-11-01 MED ORDER — POTASSIUM CHLORIDE IN NACL 20-0.45 MEQ/L-% IV SOLN
INTRAVENOUS | Status: DC
  Filled 2021-11-01 (×2): qty 1000

## 2021-11-01 MED ORDER — PANTOPRAZOLE SODIUM 40 MG PO TBEC
40.0000 mg | DELAYED_RELEASE_TABLET | Freq: Every day | ORAL | Status: DC
  Administered 2021-11-01: 40 mg via ORAL
  Filled 2021-11-01: qty 1

## 2021-11-01 MED ORDER — KETOTIFEN FUMARATE 0.025 % OP SOLN
1.0000 [drp] | Freq: Two times a day (BID) | OPHTHALMIC | Status: DC
  Administered 2021-11-01: 1 [drp] via OPHTHALMIC
  Filled 2021-11-01: qty 5

## 2021-11-01 MED ORDER — MIDAZOLAM HCL 2 MG/2ML IJ SOLN
INTRAMUSCULAR | Status: AC
  Filled 2021-11-01: qty 2

## 2021-11-01 MED ORDER — ACETAMINOPHEN 500 MG PO TABS
1000.0000 mg | ORAL_TABLET | Freq: Once | ORAL | Status: AC
  Administered 2021-11-01: 1000 mg via ORAL
  Filled 2021-11-01: qty 2

## 2021-11-01 MED ORDER — EPINEPHRINE 0.3 MG/0.3ML IJ SOAJ
0.3000 mg | INTRAMUSCULAR | Status: DC | PRN
  Filled 2021-11-01: qty 0.6

## 2021-11-01 MED ORDER — FENTANYL CITRATE (PF) 250 MCG/5ML IJ SOLN
INTRAMUSCULAR | Status: AC
  Filled 2021-11-01: qty 5

## 2021-11-01 MED ORDER — ATORVASTATIN CALCIUM 80 MG PO TABS
80.0000 mg | ORAL_TABLET | Freq: Every day | ORAL | Status: DC
  Administered 2021-11-01: 80 mg via ORAL
  Filled 2021-11-01: qty 1

## 2021-11-01 MED ORDER — LACTATED RINGERS IV SOLN: INTRAVENOUS | Status: DC | PRN

## 2021-11-01 MED ORDER — CELECOXIB 200 MG PO CAPS
200.0000 mg | ORAL_CAPSULE | Freq: Once | ORAL | Status: AC
  Administered 2021-11-01: 200 mg via ORAL
  Filled 2021-11-01: qty 1

## 2021-11-01 MED ORDER — EPHEDRINE 5 MG/ML INJ
INTRAVENOUS | Status: AC
  Filled 2021-11-01: qty 5

## 2021-11-01 MED ORDER — FENTANYL CITRATE (PF) 100 MCG/2ML IJ SOLN
25.0000 ug | INTRAMUSCULAR | Status: DC | PRN
  Administered 2021-11-01 (×2): 50 ug via INTRAVENOUS

## 2021-11-01 MED ORDER — FLUTICASONE PROPIONATE 50 MCG/ACT NA SUSP
2.0000 | Freq: Every day | NASAL | Status: DC | PRN
  Filled 2021-11-01: qty 16

## 2021-11-01 MED ORDER — ACETAMINOPHEN 325 MG PO TABS: 650.0000 mg | ORAL_TABLET | ORAL | Status: DC | PRN

## 2021-11-01 MED ORDER — BUPIVACAINE LIPOSOME 1.3 % IJ SUSP
INTRAMUSCULAR | Status: DC | PRN
  Administered 2021-11-01: 4 mL
  Administered 2021-11-01: 5 mL

## 2021-11-01 MED ORDER — ONDANSETRON HCL 4 MG/2ML IJ SOLN
INTRAMUSCULAR | Status: DC | PRN
  Administered 2021-11-01: 4 mg via INTRAVENOUS

## 2021-11-01 MED ORDER — OXYCODONE HCL 5 MG PO TABS: 5.0000 mg | ORAL_TABLET | Freq: Once | ORAL | Status: DC | PRN

## 2021-11-01 MED ORDER — METHOCARBAMOL 500 MG PO TABS
500.0000 mg | ORAL_TABLET | Freq: Four times a day (QID) | ORAL | Status: DC | PRN
  Administered 2021-11-01: 500 mg via ORAL
  Filled 2021-11-01: qty 1

## 2021-11-01 MED ORDER — MENTHOL 3 MG MT LOZG: 1.0000 | LOZENGE | OROMUCOSAL | Status: DC | PRN

## 2021-11-01 MED ORDER — PROPOFOL 10 MG/ML IV BOLUS
INTRAVENOUS | Status: DC | PRN
  Administered 2021-11-01: 140 mg via INTRAVENOUS

## 2021-11-01 MED ORDER — OXYCODONE HCL 5 MG/5ML PO SOLN: 5.0000 mg | Freq: Once | ORAL | Status: DC | PRN

## 2021-11-01 MED ORDER — SODIUM CHLORIDE 0.9% FLUSH
3.0000 mL | Freq: Two times a day (BID) | INTRAVENOUS | Status: DC
  Administered 2021-11-01: 3 mL via INTRAVENOUS

## 2021-11-01 MED ORDER — FLUTICASONE FUROATE-VILANTEROL 200-25 MCG/ACT IN AEPB
1.0000 | INHALATION_SPRAY | Freq: Every day | RESPIRATORY_TRACT | Status: DC
  Administered 2021-11-02: 1 via RESPIRATORY_TRACT
  Filled 2021-11-01: qty 28

## 2021-11-01 MED ORDER — FLUOXETINE HCL 20 MG PO CAPS
20.0000 mg | ORAL_CAPSULE | Freq: Every day | ORAL | Status: DC
  Administered 2021-11-01: 20 mg via ORAL
  Filled 2021-11-01: qty 1

## 2021-11-01 MED ORDER — ACETAMINOPHEN 650 MG RE SUPP: 650.0000 mg | RECTAL | Status: DC | PRN

## 2021-11-01 MED ORDER — DICLOFENAC SODIUM 1 % EX GEL: 4.0000 g | Freq: Four times a day (QID) | CUTANEOUS | Status: DC | PRN

## 2021-11-01 SURGICAL SUPPLY — 59 items
ADH SKN CLS APL DERMABOND .7 (GAUZE/BANDAGES/DRESSINGS) ×1
BAG COUNTER SPONGE SURGICOUNT (BAG) ×1 IMPLANT
BAG SPNG CNTER NS LX DISP (BAG) ×1
BLADE CLIPPER SURG (BLADE) IMPLANT
BLADE SURG 15 STRL LF DISP TIS (BLADE) ×1 IMPLANT
BLADE SURG 15 STRL SS (BLADE) ×1
BUR ROUND FLUTED 4 SOFT TCH (BURR) ×1 IMPLANT
BUR STRYKER TAPERED RND 2.0M (BURR) IMPLANT
CNTNR URN SCR LID CUP LEK RST (MISCELLANEOUS) IMPLANT
CONT SPEC 4OZ STRL OR WHT (MISCELLANEOUS) ×1
CORD BIPOLAR FORCEPS 12FT (ELECTRODE) ×1 IMPLANT
COVER SURGICAL LIGHT HANDLE (MISCELLANEOUS) ×1 IMPLANT
DERMABOND ADVANCED .7 DNX12 (GAUZE/BANDAGES/DRESSINGS) ×1 IMPLANT
DRAPE HALF SHEET 40X57 (DRAPES) ×1 IMPLANT
DRAPE SURG 17X23 STRL (DRAPES) ×5 IMPLANT
DRSG MEPILEX BORDER 4X4 (GAUZE/BANDAGES/DRESSINGS) ×1 IMPLANT
DRSG MEPILEX BORDER 4X8 (GAUZE/BANDAGES/DRESSINGS) ×1 IMPLANT
DURAPREP 26ML APPLICATOR (WOUND CARE) ×1 IMPLANT
ELECT REM PT RETURN 9FT ADLT (ELECTROSURGICAL) ×1
ELECTRODE REM PT RTRN 9FT ADLT (ELECTROSURGICAL) ×1 IMPLANT
EVACUATOR 1/8 PVC DRAIN (DRAIN) IMPLANT
GAUZE XEROFORM 5X9 LF (GAUZE/BANDAGES/DRESSINGS) ×1 IMPLANT
GLOVE BIOGEL PI IND STRL 7.5 (GLOVE) IMPLANT
GLOVE BIOGEL PI IND STRL 8 (GLOVE) ×2 IMPLANT
GLOVE BIOGEL PI IND STRL 8.5 (GLOVE) IMPLANT
GLOVE ECLIPSE 9.0 STRL (GLOVE) IMPLANT
GLOVE ORTHO TXT STRL SZ7.5 (GLOVE) ×2 IMPLANT
GOWN STRL REUS W/ TWL LRG LVL3 (GOWN DISPOSABLE) ×1 IMPLANT
GOWN STRL REUS W/ TWL XL LVL3 (GOWN DISPOSABLE) ×1 IMPLANT
GOWN STRL REUS W/TWL 2XL LVL3 (GOWN DISPOSABLE) ×1 IMPLANT
GOWN STRL REUS W/TWL LRG LVL3 (GOWN DISPOSABLE) ×1
GOWN STRL REUS W/TWL XL LVL3 (GOWN DISPOSABLE) ×1
KIT BASIN OR (CUSTOM PROCEDURE TRAY) ×1 IMPLANT
KIT TURNOVER KIT B (KITS) ×1 IMPLANT
MANIFOLD NEPTUNE II (INSTRUMENTS) ×1 IMPLANT
NDL 1/2 CIR MAYO (NEEDLE) IMPLANT
NEEDLE 1/2 CIR MAYO (NEEDLE) IMPLANT
NS IRRIG 1000ML POUR BTL (IV SOLUTION) ×1 IMPLANT
PACK ORTHO CERVICAL (CUSTOM PROCEDURE TRAY) ×1 IMPLANT
PAD ARMBOARD 7.5X6 YLW CONV (MISCELLANEOUS) ×2 IMPLANT
PATTIES SURGICAL .25X.25 (GAUZE/BANDAGES/DRESSINGS) IMPLANT
SPONGE SURGIFOAM ABS GEL 100 (HEMOSTASIS) IMPLANT
SPONGE T-LAP 4X18 ~~LOC~~+RFID (SPONGE) ×3 IMPLANT
STAPLER VISISTAT 35W (STAPLE) IMPLANT
SUT BONE WAX W31G (SUTURE) ×1 IMPLANT
SUT ETHIBOND CT1 BRD #0 30IN (SUTURE) IMPLANT
SUT STEEL 1 (SUTURE) IMPLANT
SUT STEEL 2 (SUTURE) IMPLANT
SUT VIC AB 0 CT1 27 (SUTURE) ×2
SUT VIC AB 0 CT1 27XBRD ANBCTR (SUTURE) ×1 IMPLANT
SUT VIC AB 2-0 CT1 27 (SUTURE) ×1
SUT VIC AB 2-0 CT1 TAPERPNT 27 (SUTURE) ×2 IMPLANT
SUT VIC AB 3-0 X1 27 (SUTURE) IMPLANT
SUT VICRYL 4-0 PS2 18IN ABS (SUTURE) IMPLANT
TOWEL GREEN STERILE (TOWEL DISPOSABLE) ×1 IMPLANT
TOWEL GREEN STERILE FF (TOWEL DISPOSABLE) ×1 IMPLANT
TRAY FOLEY MTR SLVR 16FR STAT (SET/KITS/TRAYS/PACK) IMPLANT
WATER STERILE IRR 1000ML POUR (IV SOLUTION) ×1 IMPLANT
YANKAUER SUCT BULB TIP NO VENT (SUCTIONS) ×1 IMPLANT

## 2021-11-01 NOTE — Transfer of Care (Signed)
Immediate Anesthesia Transfer of Care Note  [name]  Procedure(s) Performed: LEFT C4-5 AND LEFT C7-T1 FORAMINOTOMIES (Left: Spine Cervical)  Patient Location: PACU  Anesthesia Type:General  Level of Consciousness: drowsy  Airway & Oxygen Therapy: Patient Spontanous Breathing and Patient connected to face mask oxygen  Post-op Assessment: Report given to RN and Post -op Vital signs reviewed and stable  Post vital signs: Reviewed and stable  Last Vitals:  Vitals Value Taken Time  BP 135/86 11/01/21 1023  Temp    Pulse 75 11/01/21 1030  Resp 11 11/01/21 1030  SpO2 96 % 11/01/21 1030  Vitals shown include unvalidated device data.  Last Pain:  Vitals:   11/01/21 0607  TempSrc:   PainSc: 5          Complications: No notable events documented.

## 2021-11-01 NOTE — Brief Op Note (Signed)
11/01/2021  10:10 AM  PATIENT:  Gregory [name]  61 y.o. male  PRE-OPERATIVE DIAGNOSIS:  cervical spondylosis with radiculopathy left C4-5 and left C7-T1  POST-OPERATIVE DIAGNOSIS:  cervical spondylosis with radiculopathy left C4-5 and left C7-T1  PROCEDURE:  Procedure(s): LEFT C4-5 AND LEFT C7-T1 FORAMINOTOMIES (Left)  SURGEON:  Surgeon(s) and Role:    * Marybelle Killings, MD - Primary    * Jessy Oto, MD  ASSISTANTS: [name], MD   ANESTHESIA:   local and general  EBL:  200 mL   BLOOD ADMINISTERED:none  DRAINS: none   LOCAL MEDICATIONS USED:  MARCAINE 0.5% 1:1 EXPAREL Amount: 20 ml  SPECIMEN:  No Specimen  DISPOSITION OF SPECIMEN:  N/A  COUNTS:  YES  TOURNIQUET:  * No tourniquets in log *  DICTATION: .Dragon Dictation  PLAN OF CARE: Admit for overnight observation  PATIENT DISPOSITION:  PACU - hemodynamically stable.   Delay start of Pharmacological VTE agent (>24hrs) due to surgical blood loss or risk of bleeding: yes

## 2021-11-01 NOTE — Anesthesia Postprocedure Evaluation (Signed)
Anesthesia Post Note  [name]  Procedure(s) Performed: LEFT C4-5 AND LEFT C7-T1 FORAMINOTOMIES (Left: Spine Cervical)     Patient location during evaluation: PACU Anesthesia Type: General Level of consciousness: awake and alert Pain management: pain level controlled Vital Signs Assessment: post-procedure vital signs reviewed and stable Respiratory status: spontaneous breathing, nonlabored ventilation and respiratory function stable Cardiovascular status: stable and blood pressure returned to baseline Anesthetic complications: no   No notable events documented.  Last Vitals:  Vitals:   11/01/21 1115 11/01/21 1130  BP: 137/88 133/84  Pulse: 76 70  Resp: (!) 35 13  Temp:    SpO2: 94% 94%    Last Pain:  Vitals:   11/01/21 1130  TempSrc:   PainSc: [place]

## 2021-11-01 NOTE — Interval H&P Note (Signed)
History and Physical Interval Note:  11/01/2021 7:30 AM  Gregory [name]  has presented today for surgery, with the diagnosis of cervical spondylosis with radiculopathy left C4-5 and left C7-T1.  The various methods of treatment have been discussed with the patient and family. After consideration of risks, benefits and other options for treatment, the patient has consented to  Procedure(s): LEFT C4-5 AND LEFT C7-T1 FORAMINOTOMIES (N/A) as a surgical intervention.  The patient's history has been reviewed, patient examined, no change in status, stable for surgery.  I have reviewed the patient's chart and labs.  Questions were answered to the patient's satisfaction.     Marybelle Killings

## 2021-11-01 NOTE — Op Note (Signed)
11/01/2021  10:12 AM  PATIENT:  Gregory [name]  61 y.o. male  MRN: [id number]  OPERATIVE REPORT  PRE-OPERATIVE DIAGNOSIS:  cervical spondylosis with radiculopathy left C4-5 and left C7-T1  POST-OPERATIVE DIAGNOSIS:  cervical spondylosis with radiculopathy left C4-5 and left C7-T1  PROCEDURE:  Procedure(s): LEFT C4-5 AND LEFT C7-T1 FORAMINOTOMIES    SURGEON:  Mark C. Lorin Mercy, MD     ASSISTANT:  [name], MD  (Present throughout the entire procedure and necessary for completion of procedure in a timely manner)     ANESTHESIA:  General,supplemented with local marcain 0.5% 1:1 exparel 1.3% total 20cc, Dr. Ola Spurr.    COMPLICATIONS:  None.     EBL: 200CC  PROCEDURE:The patient was met in the holding area, and the appropriate left C4-5 and left C7-T1 cervical level identified and marked with "x" and my initials. All questions were answered and informed consent signed. The patient was then transported to OR. The patient was then placed under general anesthesia without difficulty. Transferred to the operating room table prone position [place]field horseshoe with Chest rolls All pressure points well-padded PAS stockings.Shoulders taped down and skin over the posterior inferior aspect of the neck place in traction to decrease skin folds. The patient received appropriate preoperative antibiotic 2 grams ancef. prophylaxis.Time-out procedure was called and correct.  Sterile prep with DuraPrep and draped in the usual manner the shoulders were taped downwards and skin traction over the skin of the neck. Following DuraPrep draped in the usual manner. After timeout protocol. Spinal needle placed left side  At the expected C4-5 and extended down to the C7 spinous process levels, C-arm identified the Kocher clamp posterior to the C4-5interspinous process level and the lower towel clip directed at the inferior aspect of the C7 spinous process. The skin incision was made approximately C4 to T1 about 3.5 inch  in length  in the midline. This following infiltration of skin and subcutaneous layers with marcaine 0.5% with 1:1 exparel 1.3% total of 20 cc. Incision carried through skin and subcutaneous layers using 10 blade scalpel and electrocautery down to the level ligamentum nuchae. Incision made along the left lateral spinous process of C7 extending downwards to the T1 spinous process and at the superior incision site exposing the spinous process of C4. Clamp then placed at the interspinous process of C4-5 and a towel clip placed over the lower aspect of the C7 spinous process and intraoperative C-arm fluoroscopy identified the clamp at the C4-5 interspinous process level and the lower towel at the lower lamina of C7. A marking pen was used to mark the spinous process of C4-5. Electrocautery then used to carefully incise the cervical muscles off the left lateral aspect of the spinous process of C7 and T1. Dividing the spinous muscles off of the inferior aspect lamina at C7 exposing the C7-T1 posterior aspect of the interlaminar space. The magnification headlamp were used during this portion procedure. Boss McCollough retractor was inserted. High-speed bur was used to remove a small portion of bone from the inferior aspect of lamina of C7 and the medial 20% of the inferior articular process of C7. Further thinning the superior aspect of the lamina left T1. A 1 mm Kerrison was then used to remove him from superior aspect of the lamina T1 and the medial aspect of the infra-articular process of C7 the 20%, exposing the superior articular process of T1. A 1 mm Kerrison was removed bone off the medial aspect of the superior articular process of T1  resecting 20% of the medial aspect of the superior articular process of T1. Ligamentum flavum then easily lifted superiorly electrocautery unit cauterizing epidural veins deep to the ligamentum flavum then resecting the ligamentum flavum. The operating room microscope was draped  sterilely and brought into the field. Under the operating room microscope the epidural vein layer overlying the posterior aspect of the thecal sac and the C8 nerve root was then carefully lifted using a micro-titanium then cauterized using bipolar electrocautery a # 15 blade scalpel then used to incise this overlying the C8 nerve root releasing the vascular leash a backward angle 3-0 microcurette then used to remove a small portion of bone off the superior and medial aspect of the pedicle further mobilizing the C8 nerve root bipolar electrocautery to control all bleeding within the axillary area of the C8 nerve. Bone wax was applied to bleeding cancellus bone surfaces are excellent hemostasis obtained The disc explored using a Penfield 4 found to be protruded. No significant disc material found to be herniated and uncovertebral joint found to be prominent resulting in left C8 foramenal narrowing. Following this then hemostasis was obtained using thrombin-soaked Gelfoam and micro-pledgettes. When complete hemostasis was obtained all gel foam was removed I nerve hook could be easily passed out the neuroforamen without the lateral aspect of the T1 pedicle demonstrate the C8 neuroforamen completely decompressed. Irrigation was carried out no active bleeding was present. Electrocautery then used to carefully incise the cervical muscles off the left lateral aspect of the spinous process of C4 and C5. Dividing the spinous muscles off of the inferior aspect lamina at C4 exposing the C4-5 posterior aspect of the interlaminar space. The magnification headlamp were used during this portion procedure. MIS retractor was inserted. High-speed bur was used to remove a small portion of bone from the inferior aspect of lamina of C4 and the medial 20% of the inferior articular process of C4. Further thinning the superior aspect of the lamina left C5. A 1 mm Kerrison was then used to remove him from superior aspect of the lamina C5 and  the medial aspect of the infra-articular process of C4 the 20%, exposing the superior articular process of C5. A 1 mm Kerrison was removed bone off the medial aspect of the superior articular process of C5 resecting 20% of the medial aspect of the superior articular process of C5. Ligamentum flavum then easily lifted superiorly electrocautery unit cauterizing epidural veins deep to the ligamentum flavum then resecting the ligamentum flavum. The operating room microscope was draped sterilely and brought into the field. Under the operating room microscope the epidural vein layer overlying the posterior aspect of the thecal sac and the C5 nerve root was then carefully lifted using a micro-titanium then cauterized using bipolar electrocautery a # 15 blade scalpel then used to incise this overlying the C5 nerve root releasing the vascular leash a backward angle 3-0 microcurette then used to remove a small portion of bone off the superior and medial aspect of the pedicle further mobilizing the C5 nerve root bipolar electrocautery to control all bleeding within the axillary area of the C5 nerve. Bone wax was applied to bleeding cancellus bone surfaces are excellent hemostasis obtained The disc explored using a Penfield 4 found to be protruded. No significant disc material found to be herniated and uncovertebral joint found to be prominent resulting in left C5 foramenal narrowing. Following this then hemostasis was obtained using thrombin-soaked Gelfoam and micro-pledgettes. When complete hemostasis was obtained all gel foam was  removed I nerve hook could be easily passed out the neuroforamen without the lateral aspect of the C5 pedicle demonstrate the C5 neuroforamen completely decompressed. Irrigation was carried out no active bleeding was present. The incision was closed by approximating the ligamentum nuchae with 0 ethylbond sutures. The subcutaneous layers approximated with interrupted 0 Vicryl suture more superficial  layers with interrupted 2-0 Vicryl sutures and the skin closed with interrupted stainless steel staples then MedPlex dressing was applied.   Soft cervical collar placed.  All instrument and sponge counts were correct. Patient was then returned to supine position on her stretcher. Returned to recovery room in satisfactory condition.  Physician assistant's responsibilities: [name] MD perform the duties of assistant physician and surgeon during this case present from the beginning of the case to the end of the case. He assisted with careful retraction of neural structures suctioning about her elements including cervical cord and C5 and C8 nerve roots. Performed closure of the incision on the ligamentum nuchae to the skin and application of dressing. He assisted in positioning the patient had removal the patient from the OR table to his stretcher.     [name]  11/01/2021, 10:12 AM

## 2021-11-02 DIAGNOSIS — M4723 Other spondylosis with radiculopathy, cervicothoracic region: Secondary | ICD-10-CM | POA: Diagnosis not present

## 2021-11-02 MED ORDER — OXYCODONE HCL 5 MG PO TABS: 5.0000 mg | ORAL_TABLET | ORAL | 0 refills | Status: DC | PRN

## 2021-11-02 MED ORDER — CELECOXIB 200 MG PO CAPS: 200.0000 mg | ORAL_CAPSULE | Freq: Two times a day (BID) | ORAL | 1 refills | Status: DC

## 2021-11-02 MED ORDER — METHOCARBAMOL 500 MG PO TABS: 500.0000 mg | ORAL_TABLET | Freq: Four times a day (QID) | ORAL | 2 refills | Status: DC | PRN

## 2021-11-02 NOTE — Plan of Care (Signed)
  Problem: Education: Goal: Ability to verbalize activity precautions or restrictions will improve Outcome: Adequate for Discharge Goal: Knowledge of the prescribed therapeutic regimen will improve Outcome: Adequate for Discharge Goal: Understanding of discharge needs will improve Outcome: Adequate for Discharge   Problem: Activity: Goal: Ability to avoid complications of mobility impairment will improve Outcome: Adequate for Discharge Goal: Ability to tolerate increased activity will improve Outcome: Adequate for Discharge Goal: Will remain free from falls Outcome: Adequate for Discharge   Problem: Bowel/Gastric: Goal: Gastrointestinal status for postoperative course will improve Outcome: Adequate for Discharge   Problem: Clinical Measurements: Goal: Ability to maintain clinical measurements within normal limits will improve Outcome: Adequate for Discharge Goal: Postoperative complications will be avoided or minimized Outcome: Adequate for Discharge Goal: Diagnostic test results will improve Outcome: Adequate for Discharge   Problem: Pain Management: Goal: Pain level will decrease Outcome: Adequate for Discharge   Problem: Skin Integrity: Goal: Will show signs of wound healing Outcome: Adequate for Discharge   Problem: Health Behavior/Discharge Planning: Goal: Identification of resources available to assist in meeting health care needs will improve Outcome: Adequate for Discharge   Problem: Bladder/Genitourinary: Goal: Urinary functional status for postoperative course will improve Outcome: Adequate for Discharge   Problem: Education: Goal: Knowledge of General Education information will improve Description: Including pain rating scale, medication(s)/side effects and non-pharmacologic comfort measures Outcome: Adequate for Discharge   Problem: Health Behavior/Discharge Planning: Goal: Ability to manage health-related needs will improve Outcome: Adequate for  Discharge   Problem: Clinical Measurements: Goal: Ability to maintain clinical measurements within normal limits will improve Outcome: Adequate for Discharge Goal: Will remain free from infection Outcome: Adequate for Discharge Goal: Diagnostic test results will improve Outcome: Adequate for Discharge Goal: Respiratory complications will improve Outcome: Adequate for Discharge Goal: Cardiovascular complication will be avoided Outcome: Adequate for Discharge   Problem: Activity: Goal: Risk for activity intolerance will decrease Outcome: Adequate for Discharge   Problem: Nutrition: Goal: Adequate nutrition will be maintained Outcome: Adequate for Discharge   Problem: Coping: Goal: Level of anxiety will decrease Outcome: Adequate for Discharge   Problem: Elimination: Goal: Will not experience complications related to bowel motility Outcome: Adequate for Discharge Goal: Will not experience complications related to urinary retention Outcome: Adequate for Discharge   Problem: Pain Managment: Goal: General experience of comfort will improve Outcome: Adequate for Discharge   Problem: Safety: Goal: Ability to remain free from injury will improve Outcome: Adequate for Discharge   Problem: Skin Integrity: Goal: Risk for impaired skin integrity will decrease Outcome: Adequate for Discharge   

## 2021-11-02 NOTE — Progress Notes (Signed)
Patient is doing well this morning. Therapy went well. Surgical dressing to be changed just before discharge. NVI BUE. Pain controlled.  Azucena Cecil, MD [facility] 9:11 AM

## 2021-11-02 NOTE — Progress Notes (Signed)
     Subjective: 1 Day Post-Op Procedure(s) (LRB): LEFT C4-5 AND LEFT C7-T1 FORAMINOTOMIES (Left) Awake, alert and sitting up at bedside. Complains of neck pain but is ambulating in hallway. Dr. Erlinda Hong saw earlier and discharge orders placed.  I appreciate his help. Voiding without difficulty. Dressing changed. Patient reports pain as marked.    Objective:   VITALS:  Temp:  [97.7 F (36.5 C)-98.7 F (37.1 C)] 98.3 F (36.8 C) (09/16 0719) Pulse Rate:  [62-80] 64 (09/16 0719) Resp:  [11-35] 18 (09/16 0719) BP: (98-145)/(62-95) 114/72 (09/16 0719) SpO2:  [87 %-97 %] 95 % (09/16 0859)  Neurologically intact ABD soft Neurovascular intact Sensation intact distally Intact pulses distally Dorsiflexion/Plantar flexion intact Incision: moderate drainage No cellulitis present Compartment soft Dry blood on dressing, no active bleeding, no fluctuance.   LABS No results for input(s): "HGB", "WBC", "PLT" in the last 72 hours. No results for input(s): "NA", "K", "CL", "CO2", "BUN", "CREATININE", "GLUCOSE" in the last 72 hours. No results for input(s): "LABPT", "INR" in the last 72 hours.   Assessment/Plan: 1 Day Post-Op Procedure(s) (LRB): LEFT C4-5 AND LEFT C7-T1 FORAMINOTOMIES (Left)   Advance diet Up with therapy Discharge home. Gregory [name] 11/02/2021, 10:08 AM Patient ID: Gregory [name], male   DOB: 1960-12-13, 61 y.o.   MRN: [id number]

## 2021-11-02 NOTE — Progress Notes (Signed)
Discharge instructions (including medications) discussed with and copy provided to patient/caregiver 
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
## 2021-11-02 NOTE — Discharge Instructions (Signed)
No lifting greater than 10 lbs. °Avoid bending, stooping and twisting. °Walking in house for first week then may start to get out slowly increasing activity using arms. °Keep incision dry for 3 days, may use tegaderm or similar water impervious dressing. °Avoid overhead use of arms and overhead lifting. °Wear collar for comfort. °Use ice as needed for comfort. °
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
## 2021-11-05 ENCOUNTER — Encounter (HOSPITAL_COMMUNITY): Payer: Self-pay | Admitting: Orthopaedic Surgery

## 2021-11-12 ENCOUNTER — Other Ambulatory Visit: Payer: Self-pay | Admitting: Specialist

## 2021-11-12 DIAGNOSIS — G8929 Other chronic pain: Secondary | ICD-10-CM

## 2021-11-14 ENCOUNTER — Encounter: Payer: Self-pay | Admitting: Surgery

## 2021-11-14 ENCOUNTER — Ambulatory Visit (INDEPENDENT_AMBULATORY_CARE_PROVIDER_SITE_OTHER): Payer: Medicare Other | Admitting: Surgery

## 2021-11-14 VITALS — BP 111/77 | HR 63 | Ht 67.0 in | Wt 178.0 lb

## 2021-11-14 DIAGNOSIS — Z9889 Other specified postprocedural states: Secondary | ICD-10-CM

## 2021-11-14 DIAGNOSIS — M7542 Impingement syndrome of left shoulder: Secondary | ICD-10-CM | POA: Diagnosis not present

## 2021-11-14 MED ORDER — LIDOCAINE HCL 1 % IJ SOLN
3.0000 mL | INTRAMUSCULAR | Status: AC | PRN
  Administered 2021-11-14: 3 mL

## 2021-11-14 MED ORDER — BUPIVACAINE HCL 0.25 % IJ SOLN
8.0000 mL | INTRAMUSCULAR | Status: AC | PRN
  Administered 2021-11-14: 8 mL via INTRA_ARTICULAR
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
## 2021-11-15 ENCOUNTER — Ambulatory Visit: Payer: Medicare Other | Admitting: Sports Medicine

## 2021-11-18 ENCOUNTER — Encounter: Payer: Self-pay | Admitting: Sports Medicine

## 2021-11-18 ENCOUNTER — Ambulatory Visit: Payer: Self-pay

## 2021-11-18 ENCOUNTER — Ambulatory Visit (INDEPENDENT_AMBULATORY_CARE_PROVIDER_SITE_OTHER): Payer: Medicare Other | Admitting: Sports Medicine

## 2021-11-18 VITALS — BP 117/74 | HR 67 | Ht 67.0 in | Wt 179.0 lb

## 2021-11-18 DIAGNOSIS — Z9889 Other specified postprocedural states: Secondary | ICD-10-CM

## 2021-11-18 DIAGNOSIS — M25512 Pain in left shoulder: Secondary | ICD-10-CM | POA: Diagnosis not present

## 2021-11-18 MED ORDER — LIDOCAINE HCL 1 % IJ SOLN
2.0000 mL | INTRAMUSCULAR | Status: AC | PRN
  Administered 2021-11-18: 2 mL

## 2021-11-18 MED ORDER — BUPIVACAINE HCL 0.25 % IJ SOLN
2.0000 mL | INTRAMUSCULAR | Status: AC | PRN
  Administered 2021-11-18: 2 mL via INTRA_ARTICULAR

## 2021-11-18 MED ORDER — METHYLPREDNISOLONE ACETATE 40 MG/ML IJ SUSP
80.0000 mg | INTRAMUSCULAR | Status: AC | PRN
  Administered 2021-11-18: 80 mg via INTRA_ARTICULAR

## 2021-11-18 NOTE — Progress Notes (Signed)
Pain in shoulder for several months; Had two injections last week by another provider (subacromial)  He is taking a MR and oxycodone for pain; it does help  "Nagging pain"
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
## 2021-11-20 ENCOUNTER — Emergency Department (HOSPITAL_COMMUNITY): Payer: Medicare Other

## 2021-11-20 ENCOUNTER — Emergency Department (HOSPITAL_COMMUNITY)
Admission: EM | Admit: 2021-11-20 | Discharge: 2021-11-21 | Disposition: A | Discharge status: Home / Self Care | Payer: Medicare Other | Attending: Physician Assistant | Admitting: Physician Assistant

## 2021-11-20 ENCOUNTER — Encounter (HOSPITAL_COMMUNITY): Payer: Self-pay | Admitting: Emergency Medicine

## 2021-11-20 ENCOUNTER — Telehealth: Payer: Self-pay | Admitting: Radiology

## 2021-11-20 DIAGNOSIS — S50811A Abrasion of right forearm, initial encounter: Secondary | ICD-10-CM | POA: Diagnosis not present

## 2021-11-20 DIAGNOSIS — S50812A Abrasion of left forearm, initial encounter: Secondary | ICD-10-CM | POA: Insufficient documentation

## 2021-11-20 DIAGNOSIS — S0990XA Unspecified injury of head, initial encounter: Secondary | ICD-10-CM | POA: Insufficient documentation

## 2021-11-20 DIAGNOSIS — S59912A Unspecified injury of left forearm, initial encounter: Secondary | ICD-10-CM | POA: Diagnosis present

## 2021-11-20 DIAGNOSIS — Z5321 Procedure and treatment not carried out due to patient leaving prior to being seen by health care provider: Secondary | ICD-10-CM | POA: Insufficient documentation

## 2021-11-20 DIAGNOSIS — Y92410 Unspecified street and highway as the place of occurrence of the external cause: Secondary | ICD-10-CM | POA: Insufficient documentation

## 2021-11-20 NOTE — ED Provider Notes (Signed)
Called by radiology.  Patient possibly has foreign object in right forearm.  No acute bony injury.   Samona Chihuahua A, PA-C 11/20/21 1617    Audley Hose, MD 11/21/21 [id number]
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
## 2021-11-21 NOTE — ED Notes (Signed)
Pt decided he did not want to wait any longer. Pt is leaving without being seen.

## 2021-12-04 ENCOUNTER — Ambulatory Visit (INDEPENDENT_AMBULATORY_CARE_PROVIDER_SITE_OTHER): Payer: Medicare Other | Admitting: Specialist

## 2021-12-04 ENCOUNTER — Encounter: Payer: Self-pay | Admitting: Specialist

## 2021-12-04 VITALS — BP 112/73 | HR 57 | Ht 67.0 in | Wt 179.0 lb

## 2021-12-04 DIAGNOSIS — G5622 Lesion of ulnar nerve, left upper limb: Secondary | ICD-10-CM | POA: Diagnosis not present

## 2021-12-04 DIAGNOSIS — G5603 Carpal tunnel syndrome, bilateral upper limbs: Secondary | ICD-10-CM

## 2021-12-04 DIAGNOSIS — Z9889 Other specified postprocedural states: Secondary | ICD-10-CM | POA: Diagnosis not present

## 2021-12-04 MED ORDER — OXYCODONE HCL 5 MG PO TABS: 5.0000 mg | ORAL_TABLET | Freq: Four times a day (QID) | ORAL | 0 refills | Status: DC | PRN

## 2021-12-04 MED ORDER — METHOCARBAMOL 500 MG PO TABS: 500.0000 mg | ORAL_TABLET | Freq: Four times a day (QID) | ORAL | 2 refills | Status: DC | PRN

## 2021-12-04 NOTE — Addendum Note (Signed)
Addended by: [name] on: 12/04/2021 03:21 PM   Modules accepted: Orders
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
## 2021-12-05 ENCOUNTER — Encounter: Payer: Self-pay | Admitting: Sports Medicine

## 2021-12-05 ENCOUNTER — Ambulatory Visit (INDEPENDENT_AMBULATORY_CARE_PROVIDER_SITE_OTHER): Payer: Medicare Other | Admitting: Sports Medicine

## 2021-12-05 ENCOUNTER — Ambulatory Visit: Payer: Self-pay

## 2021-12-05 DIAGNOSIS — S50351A Superficial foreign body of right elbow, initial encounter: Secondary | ICD-10-CM

## 2021-12-05 DIAGNOSIS — M25512 Pain in left shoulder: Secondary | ICD-10-CM

## 2021-12-05 MED ORDER — CEPHALEXIN 500 MG PO CAPS: 500.0000 mg | ORAL_CAPSULE | Freq: Two times a day (BID) | ORAL | 0 refills | Status: AC
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
## 2021-12-06 NOTE — Telephone Encounter (Signed)
Done

## 2021-12-19 ENCOUNTER — Ambulatory Visit (INDEPENDENT_AMBULATORY_CARE_PROVIDER_SITE_OTHER): Payer: Medicare Other | Admitting: Sports Medicine

## 2021-12-19 ENCOUNTER — Encounter: Payer: Self-pay | Admitting: Sports Medicine

## 2021-12-19 DIAGNOSIS — S50351D Superficial foreign body of right elbow, subsequent encounter: Secondary | ICD-10-CM | POA: Diagnosis not present

## 2021-12-19 DIAGNOSIS — M25512 Pain in left shoulder: Secondary | ICD-10-CM | POA: Diagnosis not present
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
## 2021-12-19 NOTE — Progress Notes (Signed)
He states the shoulder with injection is doing much better; he got great relief with injection.  The elbow, he finished antibiotic and not having any issues with that.

## 2022-02-18 ENCOUNTER — Ambulatory Visit (INDEPENDENT_AMBULATORY_CARE_PROVIDER_SITE_OTHER): Payer: Medicare Other | Admitting: Sports Medicine

## 2022-02-18 ENCOUNTER — Ambulatory Visit: Payer: Self-pay

## 2022-02-18 ENCOUNTER — Encounter: Payer: Self-pay | Admitting: Sports Medicine

## 2022-02-18 DIAGNOSIS — M25512 Pain in left shoulder: Secondary | ICD-10-CM

## 2022-02-18 DIAGNOSIS — M19012 Primary osteoarthritis, left shoulder: Secondary | ICD-10-CM

## 2022-02-18 DIAGNOSIS — Z9889 Other specified postprocedural states: Secondary | ICD-10-CM

## 2022-02-18 DIAGNOSIS — G8929 Other chronic pain: Secondary | ICD-10-CM

## 2022-02-18 DIAGNOSIS — M25562 Pain in left knee: Secondary | ICD-10-CM

## 2022-02-18 DIAGNOSIS — M2170 Unequal limb length (acquired), unspecified site: Secondary | ICD-10-CM

## 2022-02-18 DIAGNOSIS — Z96652 Presence of left artificial knee joint: Secondary | ICD-10-CM

## 2022-02-18 MED ORDER — METHYLPREDNISOLONE ACETATE 40 MG/ML IJ SUSP
80.0000 mg | INTRAMUSCULAR | Status: AC | PRN
  Administered 2022-02-18: 80 mg via INTRA_ARTICULAR

## 2022-02-18 MED ORDER — BUPIVACAINE HCL 0.25 % IJ SOLN
2.0000 mL | INTRAMUSCULAR | Status: AC | PRN
  Administered 2022-02-18: 2 mL via INTRA_ARTICULAR

## 2022-02-18 MED ORDER — CELECOXIB 100 MG PO CAPS: 100.0000 mg | ORAL_CAPSULE | Freq: Two times a day (BID) | ORAL | 1 refills | Status: DC | PRN

## 2022-02-18 MED ORDER — CELECOXIB 100 MG PO CAPS: 200.0000 mg | ORAL_CAPSULE | Freq: Two times a day (BID) | ORAL | 1 refills | Status: DC | PRN

## 2022-02-18 MED ORDER — LIDOCAINE HCL 1 % IJ SOLN
2.0000 mL | INTRAMUSCULAR | Status: AC | PRN
  Administered 2022-02-18: 2 mL

## 2022-02-18 NOTE — Progress Notes (Signed)
Doing ok today; elbow is a lot better  For the shoulder; the numbness down his arm has come back
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
## 2022-03-04 ENCOUNTER — Ambulatory Visit (INDEPENDENT_AMBULATORY_CARE_PROVIDER_SITE_OTHER): Payer: 59

## 2022-03-04 ENCOUNTER — Ambulatory Visit (INDEPENDENT_AMBULATORY_CARE_PROVIDER_SITE_OTHER): Payer: 59 | Admitting: Orthopaedic Surgery

## 2022-03-04 DIAGNOSIS — M25562 Pain in left knee: Secondary | ICD-10-CM

## 2022-03-04 DIAGNOSIS — G8929 Other chronic pain: Secondary | ICD-10-CM
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
## 2022-03-19 ENCOUNTER — Ambulatory Visit: Payer: 59

## 2022-03-25 ENCOUNTER — Ambulatory Visit: Payer: 59 | Admitting: Sports Medicine

## 2022-03-26 ENCOUNTER — Ambulatory Visit: Payer: Self-pay

## 2022-03-26 ENCOUNTER — Ambulatory Visit (INDEPENDENT_AMBULATORY_CARE_PROVIDER_SITE_OTHER): Payer: 59 | Admitting: Sports Medicine

## 2022-03-26 DIAGNOSIS — G8929 Other chronic pain: Secondary | ICD-10-CM

## 2022-03-26 DIAGNOSIS — M75101 Unspecified rotator cuff tear or rupture of right shoulder, not specified as traumatic: Secondary | ICD-10-CM

## 2022-03-26 DIAGNOSIS — M25511 Pain in right shoulder: Secondary | ICD-10-CM

## 2022-03-26 DIAGNOSIS — M25512 Pain in left shoulder: Secondary | ICD-10-CM

## 2022-03-26 DIAGNOSIS — M12811 Other specific arthropathies, not elsewhere classified, right shoulder: Secondary | ICD-10-CM

## 2022-03-26 MED ORDER — CELECOXIB 100 MG PO CAPS: 100.0000 mg | ORAL_CAPSULE | Freq: Two times a day (BID) | ORAL | 1 refills | Status: DC
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
## 2022-04-22 ENCOUNTER — Ambulatory Visit (INDEPENDENT_AMBULATORY_CARE_PROVIDER_SITE_OTHER): Payer: 59 | Admitting: Sports Medicine

## 2022-04-22 ENCOUNTER — Encounter: Payer: Self-pay | Admitting: Sports Medicine

## 2022-04-22 ENCOUNTER — Ambulatory Visit (INDEPENDENT_AMBULATORY_CARE_PROVIDER_SITE_OTHER): Payer: 59

## 2022-04-22 DIAGNOSIS — G8929 Other chronic pain: Secondary | ICD-10-CM

## 2022-04-22 DIAGNOSIS — Z981 Arthrodesis status: Secondary | ICD-10-CM

## 2022-04-22 DIAGNOSIS — M25511 Pain in right shoulder: Secondary | ICD-10-CM

## 2022-04-22 DIAGNOSIS — M25512 Pain in left shoulder: Secondary | ICD-10-CM

## 2022-04-22 NOTE — Progress Notes (Signed)
Bilateral shoulder pain/weakness  States he is scheduled for next Monday for MRI at [facility] to add the right shoulder; Cannot pick anything up with either arm Very weak and complaining of pain
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
## 2022-04-23 LAB — C-REACTIVE PROTEIN: CRP: 0.3 mg/L (ref <8.0)

## 2022-04-23 LAB — SEDIMENTATION RATE: Sed Rate: 6 mm/h (ref 0–20)

## 2022-05-19 ENCOUNTER — Encounter: Payer: Self-pay | Admitting: Sports Medicine

## 2022-05-19 ENCOUNTER — Ambulatory Visit (INDEPENDENT_AMBULATORY_CARE_PROVIDER_SITE_OTHER): Payer: 59 | Admitting: Sports Medicine

## 2022-05-19 ENCOUNTER — Other Ambulatory Visit: Payer: Self-pay

## 2022-05-19 DIAGNOSIS — M19011 Primary osteoarthritis, right shoulder: Secondary | ICD-10-CM

## 2022-05-19 DIAGNOSIS — M75102 Unspecified rotator cuff tear or rupture of left shoulder, not specified as traumatic: Secondary | ICD-10-CM | POA: Diagnosis not present

## 2022-05-19 DIAGNOSIS — M19012 Primary osteoarthritis, left shoulder: Secondary | ICD-10-CM

## 2022-05-19 DIAGNOSIS — M12812 Other specific arthropathies, not elsewhere classified, left shoulder: Secondary | ICD-10-CM

## 2022-05-19 DIAGNOSIS — M25512 Pain in left shoulder: Secondary | ICD-10-CM

## 2022-05-19 DIAGNOSIS — M25511 Pain in right shoulder: Secondary | ICD-10-CM

## 2022-05-19 DIAGNOSIS — G8929 Other chronic pain: Secondary | ICD-10-CM

## 2022-05-19 MED ORDER — METHYLPREDNISOLONE ACETATE 40 MG/ML IJ SUSP
40.0000 mg | INTRAMUSCULAR | Status: AC | PRN
Start: 2022-05-19 — End: 2022-05-19
  Administered 2022-05-19: 40 mg via INTRA_ARTICULAR

## 2022-05-19 MED ORDER — BUPIVACAINE HCL 0.25 % IJ SOLN
2.0000 mL | INTRAMUSCULAR | Status: AC | PRN
Start: 2022-05-19 — End: 2022-05-19
  Administered 2022-05-19: 2 mL via INTRA_ARTICULAR

## 2022-05-19 MED ORDER — LIDOCAINE HCL 1 % IJ SOLN
2.0000 mL | INTRAMUSCULAR | Status: AC | PRN
Start: 2022-05-19 — End: 2022-05-19
  Administered 2022-05-19: 2 mL

## 2022-05-19 MED ORDER — LIDOCAINE HCL 1 % IJ SOLN
0.5000 mL | INTRAMUSCULAR | Status: AC | PRN
Start: 2022-05-19 — End: 2022-05-19
  Administered 2022-05-19: .5 mL
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
## 2022-06-02 ENCOUNTER — Ambulatory Visit (INDEPENDENT_AMBULATORY_CARE_PROVIDER_SITE_OTHER): Payer: 59 | Admitting: Sports Medicine

## 2022-06-02 ENCOUNTER — Encounter: Payer: Self-pay | Admitting: Sports Medicine

## 2022-06-02 DIAGNOSIS — M19011 Primary osteoarthritis, right shoulder: Secondary | ICD-10-CM

## 2022-06-02 DIAGNOSIS — M75102 Unspecified rotator cuff tear or rupture of left shoulder, not specified as traumatic: Secondary | ICD-10-CM | POA: Diagnosis not present

## 2022-06-02 DIAGNOSIS — G8929 Other chronic pain: Secondary | ICD-10-CM

## 2022-06-02 DIAGNOSIS — M19012 Primary osteoarthritis, left shoulder: Secondary | ICD-10-CM | POA: Diagnosis not present

## 2022-06-02 DIAGNOSIS — M25512 Pain in left shoulder: Secondary | ICD-10-CM

## 2022-06-02 DIAGNOSIS — M25511 Pain in right shoulder: Secondary | ICD-10-CM

## 2022-06-02 MED ORDER — CELECOXIB 100 MG PO CAPS: 100.0000 mg | ORAL_CAPSULE | Freq: Two times a day (BID) | ORAL | 1 refills | Status: AC

## 2022-06-02 NOTE — Progress Notes (Signed)
He is doing good; states the injection helped some. Still having some pain; prevents him from sleeping on either shoulder.  Wants to talk about the surgery; and is it something he has to do right now, or can he put It off for some time.  Helping his wife currently recover from TKA
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
## 2022-06-11 ENCOUNTER — Encounter: Payer: Self-pay | Admitting: Physical Therapy

## 2022-06-11 ENCOUNTER — Other Ambulatory Visit: Payer: Self-pay

## 2022-06-11 ENCOUNTER — Ambulatory Visit (INDEPENDENT_AMBULATORY_CARE_PROVIDER_SITE_OTHER): Payer: 59 | Admitting: Physical Therapy

## 2022-06-11 DIAGNOSIS — M6281 Muscle weakness (generalized): Secondary | ICD-10-CM

## 2022-06-11 DIAGNOSIS — M25512 Pain in left shoulder: Secondary | ICD-10-CM

## 2022-06-11 DIAGNOSIS — M25511 Pain in right shoulder: Secondary | ICD-10-CM | POA: Diagnosis not present

## 2022-06-11 DIAGNOSIS — R293 Abnormal posture: Secondary | ICD-10-CM

## 2022-06-11 DIAGNOSIS — G8929 Other chronic pain: Secondary | ICD-10-CM | POA: Diagnosis not present

## 2022-06-19 ENCOUNTER — Encounter: Payer: 59 | Admitting: Rehabilitative and Restorative Service Providers"

## 2022-06-23 ENCOUNTER — Encounter: Payer: 59 | Admitting: Physical Therapy

## 2022-06-23 ENCOUNTER — Telehealth: Payer: Self-pay | Admitting: Physical Therapy

## 2022-06-23 NOTE — Telephone Encounter (Signed)
I called pt to follow up after he missed his physical therapy appointment this morning. Pt stated he overslept. Pt was reminded of his appointment on Wednesday at 9:30.   Narda Amber, PT, MPT 06/23/22 1:02 PM

## 2022-06-25 ENCOUNTER — Encounter: Payer: 59 | Admitting: Physical Therapy

## 2022-06-30 ENCOUNTER — Other Ambulatory Visit: Payer: Self-pay

## 2022-06-30 ENCOUNTER — Ambulatory Visit (INDEPENDENT_AMBULATORY_CARE_PROVIDER_SITE_OTHER): Payer: 59 | Admitting: Sports Medicine

## 2022-06-30 ENCOUNTER — Encounter: Payer: 59 | Admitting: Physical Therapy

## 2022-06-30 ENCOUNTER — Encounter: Payer: Self-pay | Admitting: Sports Medicine

## 2022-06-30 DIAGNOSIS — G8929 Other chronic pain: Secondary | ICD-10-CM

## 2022-06-30 DIAGNOSIS — M19012 Primary osteoarthritis, left shoulder: Secondary | ICD-10-CM

## 2022-06-30 DIAGNOSIS — M25511 Pain in right shoulder: Secondary | ICD-10-CM | POA: Diagnosis not present

## 2022-06-30 DIAGNOSIS — M19011 Primary osteoarthritis, right shoulder: Secondary | ICD-10-CM

## 2022-06-30 DIAGNOSIS — M25512 Pain in left shoulder: Secondary | ICD-10-CM

## 2022-06-30 MED ORDER — BUPIVACAINE HCL 0.25 % IJ SOLN
1.0000 mL | INTRAMUSCULAR | Status: AC | PRN
Start: 2022-06-30 — End: 2022-06-30
  Administered 2022-06-30: 1 mL via INTRA_ARTICULAR

## 2022-06-30 MED ORDER — LIDOCAINE HCL 1 % IJ SOLN
2.0000 mL | INTRAMUSCULAR | Status: AC | PRN
Start: 2022-06-30 — End: 2022-06-30
  Administered 2022-06-30: 2 mL

## 2022-06-30 MED ORDER — LIDOCAINE HCL 1 % IJ SOLN
2.0000 mL | INTRAMUSCULAR | Status: AC | PRN
  Administered 2022-06-30: 2 mL

## 2022-06-30 MED ORDER — BUPIVACAINE HCL 0.25 % IJ SOLN
1.0000 mL | INTRAMUSCULAR | Status: AC | PRN
  Administered 2022-06-30: 1 mL via INTRA_ARTICULAR

## 2022-06-30 MED ORDER — METHYLPREDNISOLONE ACETATE 40 MG/ML IJ SUSP
40.0000 mg | INTRAMUSCULAR | Status: AC | PRN
Start: 2022-06-30 — End: 2022-06-30
  Administered 2022-06-30: 40 mg via INTRA_ARTICULAR

## 2022-06-30 NOTE — Progress Notes (Signed)
Here wanting both shoulders injected In a lot of pain
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
## 2022-07-02 ENCOUNTER — Encounter: Payer: 59 | Admitting: Physical Therapy

## 2022-07-15 ENCOUNTER — Ambulatory Visit: Payer: 59 | Admitting: Sports Medicine

## 2022-08-02 ENCOUNTER — Other Ambulatory Visit: Payer: Self-pay | Admitting: Sports Medicine

## 2022-08-11 ENCOUNTER — Encounter: Payer: Self-pay | Admitting: Sports Medicine

## 2022-08-11 ENCOUNTER — Ambulatory Visit (INDEPENDENT_AMBULATORY_CARE_PROVIDER_SITE_OTHER): Payer: 59 | Admitting: Sports Medicine

## 2022-08-11 ENCOUNTER — Ambulatory Visit: Payer: 59 | Admitting: Sports Medicine

## 2022-08-11 DIAGNOSIS — M12812 Other specific arthropathies, not elsewhere classified, left shoulder: Secondary | ICD-10-CM

## 2022-08-11 DIAGNOSIS — M25511 Pain in right shoulder: Secondary | ICD-10-CM | POA: Diagnosis not present

## 2022-08-11 DIAGNOSIS — M25512 Pain in left shoulder: Secondary | ICD-10-CM

## 2022-08-11 DIAGNOSIS — M75102 Unspecified rotator cuff tear or rupture of left shoulder, not specified as traumatic: Secondary | ICD-10-CM

## 2022-08-11 DIAGNOSIS — Z981 Arthrodesis status: Secondary | ICD-10-CM

## 2022-08-11 DIAGNOSIS — G8929 Other chronic pain: Secondary | ICD-10-CM | POA: Diagnosis not present

## 2022-08-11 DIAGNOSIS — M19012 Primary osteoarthritis, left shoulder: Secondary | ICD-10-CM | POA: Diagnosis not present

## 2022-08-11 MED ORDER — CYCLOBENZAPRINE HCL 5 MG PO TABS: 5.0000 mg | ORAL_TABLET | Freq: Every day | ORAL | 0 refills | Status: DC

## 2022-08-11 MED ORDER — BUPIVACAINE HCL 0.25 % IJ SOLN
2.0000 mL | INTRAMUSCULAR | Status: AC | PRN
Start: 2022-08-11 — End: 2022-08-11
  Administered 2022-08-11: 2 mL via INTRA_ARTICULAR

## 2022-08-11 MED ORDER — METHYLPREDNISOLONE ACETATE 40 MG/ML IJ SUSP
40.0000 mg | INTRAMUSCULAR | Status: AC | PRN
Start: 2022-08-11 — End: 2022-08-11
  Administered 2022-08-11: 40 mg via INTRA_ARTICULAR

## 2022-08-11 MED ORDER — DICLOFENAC SODIUM 50 MG PO TBEC: 50.0000 mg | DELAYED_RELEASE_TABLET | Freq: Three times a day (TID) | ORAL | 0 refills | Status: DC

## 2022-08-11 MED ORDER — LIDOCAINE HCL 1 % IJ SOLN
2.0000 mL | INTRAMUSCULAR | Status: AC | PRN
Start: 2022-08-11 — End: 2022-08-11
  Administered 2022-08-11: 2 mL

## 2022-08-11 NOTE — Progress Notes (Signed)
In a lot of pain today Not doing good with the shoulders
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
## 2022-08-20 ENCOUNTER — Ambulatory Visit: Payer: Self-pay | Admitting: General Surgery

## 2022-08-31 ENCOUNTER — Other Ambulatory Visit: Payer: Self-pay | Admitting: Sports Medicine

## 2022-09-01 NOTE — Pre-Procedure Instructions (Signed)
Surgical Instructions   Your procedure is scheduled on Tuesday, July 23rd. Report to [facility] Main Entrance "A" at 11:30 A.M., then check in with the Admitting office. Any questions or running late day of surgery: call [phone]  Questions prior to your surgery date: call [phone], Monday-Friday, 8am-4pm. If you experience any cold or flu symptoms such as cough, fever, chills, shortness of breath, etc. between now and your scheduled surgery, please notify us at the above number.     Remember:  Do not eat after midnight the night before your surgery   You may drink clear liquids until 10:30 AM the morning of your surgery.   Clear liquids allowed are: Water, Non-Citrus Juices (without pulp), Carbonated Beverages, Clear Tea, Black Coffee Only (NO MILK, CREAM OR POWDERED CREAMER of any kind), and Gatorade.  Patient Instructions  The night before surgery:  No food after midnight. ONLY clear liquids after midnight  The day of surgery (if you do NOT have diabetes):  Drink ONE (1) Pre-Surgery Clear Ensure by 10:30 AM the morning of surgery. Drink in one sitting. Do not sip.  This drink was given to you during your hospital  pre-op appointment visit.  Nothing else to drink after completing the  Pre-Surgery Clear Ensure.          If you have questions, please contact your surgeon's office.      Take these medicines the morning of surgery with A SIP OF WATER  amLODipine (NORVASC)  dexlansoprazole (DEXILANT)  FLUoxetine (PROZAC)      May take these medicines IF NEEDED: albuterol (VENTOLIN HFA)- bring inhaler with you on the day of surgery cyclobenzaprine (FLEXERIL)  EPINEPHrine  fluticasone (FLONASE)  fluticasone furoate-vilanterol (BREO ELLIPTA)  topiramate (TOPAMAX)  traMADol (ULTRAM)    One week prior to surgery, STOP taking any Aspirin (unless otherwise instructed by your surgeon) Aleve, Naproxen, Ibuprofen, Motrin, Advil, diclofenac (VOLTAREN), Goody's, BC's, all  herbal medications, fish oil, and non-prescription vitamins.                     Do NOT Smoke (Tobacco/Vaping) for 24 hours prior to your procedure.  If you use a CPAP at night, you may bring your mask/headgear for your overnight stay.   You will be asked to remove any contacts, glasses, piercing's, hearing aid's, dentures/partials prior to surgery. Please bring cases for these items if needed.    Patients discharged the day of surgery will not be allowed to drive home, and someone needs to stay with them for 24 hours.  SURGICAL WAITING ROOM VISITATION Patients may have no more than 2 support people in the waiting area - these visitors may rotate.   Pre-op nurse will coordinate an appropriate time for 1 ADULT support person, who may not rotate, to accompany patient in pre-op.  Children under the age of 48 must have an adult with them who is not the patient and must remain in the main waiting area with an adult.  If the patient needs to stay at the hospital during part of their recovery, the visitor guidelines for inpatient rooms apply.  Please refer to the [facility] website for the visitor guidelines for any additional information.   If you received a COVID test during your pre-op visit  it is requested that you wear a mask when out in public, stay away from anyone that may not be feeling well and notify your surgeon if you develop symptoms. If you have been in contact with  anyone that has tested positive in the last 10 days please notify you surgeon.      Pre-operative CHG Bathing Instructions   You can play a key role in reducing the risk of infection after surgery. Your skin needs to be as free of germs as possible. You can reduce the number of germs on your skin by washing with CHG (chlorhexidine gluconate) soap before surgery. CHG is an antiseptic soap that kills germs and continues to kill germs even after washing.   DO NOT use if you have an allergy to chlorhexidine/CHG or  antibacterial soaps. If your skin becomes reddened or irritated, stop using the CHG and notify one of our RNs at [phone].                 TAKE A SHOWER THE NIGHT BEFORE SURGERY AND THE DAY OF SURGERY    Please keep in mind the following:  DO NOT shave, including legs and underarms, 48 hours prior to surgery.   You may shave your face before/day of surgery.  Place clean sheets on your bed the night before surgery Use a clean washcloth (not used since being washed) for each shower. DO NOT sleep with pet's night before surgery.  CHG Shower Instructions:  If you choose to wash your hair and private area, wash first with your normal shampoo/soap.  After you use shampoo/soap, rinse your hair and body thoroughly to remove shampoo/soap residue.  Turn the water OFF and apply half the bottle of CHG soap to a CLEAN washcloth.  Apply CHG soap ONLY FROM YOUR NECK DOWN TO YOUR TOES (washing for 3-5 minutes)  DO NOT use CHG soap on face, private areas, open wounds, or sores.  Pay special attention to the area where your surgery is being performed.  If you are having back surgery, having someone wash your back for you may be helpful. Wait 2 minutes after CHG soap is applied, then you may rinse off the CHG soap.  Pat dry with a clean towel  Put on clean clothes/pajamas    Additional instructions for the day of surgery: DO NOT APPLY any lotions, deodorants, cologne, or perfumes.   Do not wear jewelry or makeup Do not wear nail polish, gel polish, artificial nails, or any other type of covering on natural nails (fingers and toes) Do not bring valuables to the hospital. [facility] is not responsible for valuables/personal belongings. Put on clean/comfortable clothes.  Please brush your teeth.  Ask your nurse before applying any prescription medications to the skin.

## 2022-09-02 ENCOUNTER — Other Ambulatory Visit: Payer: Self-pay

## 2022-09-02 ENCOUNTER — Encounter (HOSPITAL_COMMUNITY)
Admission: RE | Admit: 2022-09-02 | Discharge: 2022-09-02 | Disposition: A | Discharge status: Home / Self Care | Payer: 59 | Source: Ambulatory Visit | Attending: General Surgery | Admitting: General Surgery

## 2022-09-02 ENCOUNTER — Encounter (HOSPITAL_COMMUNITY): Payer: Self-pay

## 2022-09-02 VITALS — BP 116/77 | HR 65 | Temp 97.9°F | Resp 17 | Ht 67.0 in | Wt 165.7 lb

## 2022-09-02 DIAGNOSIS — I10 Essential (primary) hypertension: Secondary | ICD-10-CM | POA: Insufficient documentation

## 2022-09-02 DIAGNOSIS — Z01812 Encounter for preprocedural laboratory examination: Secondary | ICD-10-CM | POA: Diagnosis not present

## 2022-09-02 DIAGNOSIS — Z01818 Encounter for other preprocedural examination: Secondary | ICD-10-CM

## 2022-09-02 LAB — BASIC METABOLIC PANEL
Anion gap: 5 (ref 5–15)
BUN: 5 mg/dL — ABNORMAL LOW (ref 8–23)
CO2: 28 mmol/L (ref 22–32)
Calcium: 8.3 mg/dL — ABNORMAL LOW (ref 8.9–10.3)
Chloride: 105 mmol/L (ref 98–111)
Creatinine, Ser: 0.91 mg/dL (ref 0.61–1.24)
GFR, Estimated: >60 mL/min (ref >60)
Glucose, Bld: 105 mg/dL — ABNORMAL HIGH (ref 70–99)
Potassium: 4.2 mmol/L (ref 3.5–5.1)
Sodium: 138 mmol/L (ref 135–145)

## 2022-09-02 LAB — CBC
HCT: 40.9 % (ref 39.0–52.0)
Hemoglobin: 13 g/dL (ref 13.0–17.0)
MCH: 29 pg (ref 26.0–34.0)
MCHC: 31.8 g/dL (ref 30.0–36.0)
MCV: 91.3 fL (ref 80.0–100.0)
Platelets: 306 10*3/uL (ref 150–400)
RBC: 4.48 MIL/uL (ref 4.22–5.81)
RDW: 12.6 % (ref 11.5–15.5)
WBC: 6.6 10*3/uL (ref 4.0–10.5)
nRBC: 0 % (ref 0.0–0.2)

## 2022-09-02 LAB — NO BLOOD PRODUCTS

## 2022-09-02 NOTE — Progress Notes (Signed)
PCP - Hillery Aldo, NP Cardiologist - denies  PPM/ICD - denies   Chest x-ray - 02/12/21 EKG - 10/08/21 Stress Test - 03/09/09 ECHO - 12/12/19 Cardiac Cath - denies  Sleep Study - OSA+ CPAP - denies, Pt states he "threw up in his mask" and could not tolerate  DM- denies  ASA/Blood Thinner Instructions: n/a   ERAS Protcol - yes PRE-SURGERY Ensure given at PAT  COVID TEST- n/a   Anesthesia review: no  Patient denies shortness of breath, fever, cough and chest pain at PAT appointment   All instructions explained to the patient, with a verbal understanding of the material. Patient agrees to go over the instructions while at home for a better understanding.  The opportunity to ask questions was provided.

## 2022-09-04 ENCOUNTER — Ambulatory Visit (INDEPENDENT_AMBULATORY_CARE_PROVIDER_SITE_OTHER): Payer: 59 | Admitting: Orthopedic Surgery

## 2022-09-04 ENCOUNTER — Other Ambulatory Visit: Payer: Self-pay | Admitting: General Surgery

## 2022-09-04 ENCOUNTER — Ambulatory Visit: Payer: Self-pay | Admitting: General Surgery

## 2022-09-04 DIAGNOSIS — M19012 Primary osteoarthritis, left shoulder: Secondary | ICD-10-CM | POA: Diagnosis not present

## 2022-09-04 DIAGNOSIS — Z01818 Encounter for other preprocedural examination: Secondary | ICD-10-CM
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
## 2022-09-05 NOTE — Progress Notes (Signed)
Patient phoned to give updated information on surgery.  [facility] r[facility]s from [facility] to [facility]  Date of Surgery - 09-09-22  Arrival Time - 11:15 and check in at admitting.    NPO Status - patient reminded to not eat solid food after midnight.  From midnight until 10:30 may have clear liquids  Medications morning of surgery - Amlodipine, Dexlansoprazole, Fluoxetine, Topiramate, Tramadol if needed  No change in medical history, allergies per patient.  Transportation home -Patient's wife  All questions answered and patient stated understanding

## 2022-09-06 ENCOUNTER — Encounter: Payer: Self-pay | Admitting: Orthopedic Surgery

## 2022-09-08 NOTE — H&P (Signed)
Chief Complaint: New Consultation   History of Present Illness: Gregory [name] is a 63 y.o. male who is seen today as an office consultation at the request of Dr. Jacinto Reap for evaluation of New Consultation .  Patient is a 62 year old male, who comes in with a history of hypertension, reflux, sleep apnea who comes in secondary to a right inguinal hernia. He states has been there for approximate 2 months. He states is gotten larger. He states that he is having pain discomfort. He is active. He does Aeronautical engineer.  He had no signs or symptoms of incarceration or strangulation.  Review of Systems: A complete review of systems was obtained from the patient. I have reviewed this information and discussed as appropriate with the patient. See HPI as well for other ROS.  Review of Systems  Constitutional: Negative for fever.  HENT: Negative for congestion.  Eyes: Negative for blurred vision.  Respiratory: Negative for cough, shortness of breath and wheezing.  Cardiovascular: Negative for chest pain and palpitations.  Gastrointestinal: Negative for heartburn.  Genitourinary: Negative for dysuria.  Musculoskeletal: Negative for myalgias.  Skin: Negative for rash.  Neurological: Negative for dizziness and headaches.  Psychiatric/Behavioral: Negative for depression and suicidal ideas.  All other systems reviewed and are negative.   Medical History: Past Medical History:  Diagnosis Date  Anxiety  Arthritis  Asthma, unspecified asthma severity, unspecified whether complicated, unspecified whether persistent (HHS-HCC)  COPD (chronic obstructive pulmonary disease) (CMS/HHS-HCC)  GERD (gastroesophageal reflux disease)  Hyperlipidemia  Hypertension  Sleep apnea   There is no problem list on file for this patient.  Past Surgical History:  Procedure Laterality Date  ANTERIOR FUSION CERVICAL SPINE 2005  ARTHROPLASTY TOTAL KNEE 2013  KNEE ARTHROTOMY Left 2014  NEUROPLASTY / TRANSPOSITION ULNAR  NERVE AT ELBOW Bilateral 2019  Anterior cervical decomp/discectomy fusion 2019  Anterior cervical decomp/discectomy fusion 2020  Posterior cervical fusion/foraminotomy 2023  ENDOSCOPIC CARPAL TUNNEL RELEASE Bilateral  2019    Allergies  Allergen Reactions  Peanut Anaphylaxis  Strawberry Extract Hives  Patietn states he breaks out in hives when eating strawberries  Lisinopril Cough  Significant dry cough  Meperidine Unknown  Hydromorphone Itching and Rash  Meperidine Hcl Itching and Rash  delusional  Morphine Itching, Rash and Unknown   Current Outpatient Medications on File Prior to Visit  Medication Sig Dispense Refill  albuterol MDI, PROVENTIL, VENTOLIN, PROAIR, HFA 90 mcg/actuation inhaler Inhale 2 Inhalations into the lungs every 6 (six) hours as needed  amLODIPine (NORVASC) 10 MG tablet Take 10 mg by mouth once daily  atorvastatin (LIPITOR) 80 MG tablet Take 80 mg by mouth at bedtime  azelastine (OPTIVAR) 0.05 % ophthalmic solution Apply 1 drop to eye 2 (two) times daily  celecoxib (CELEBREX) 100 MG capsule Take 100 mg by mouth 2 (two) times daily  chlorhexidine (PERIDEX) 0.12 % solution as directed  dexlansoprazole (DEXILANT) 60 mg DR capsule Take 60 mg by mouth once daily  diclofenac (VOLTAREN) 1 % topical gel APPLY 4 G TOPICALLY 4 (FOUR) TIMES DAILY AS NEEDED (PAIN).  EPINEPHrine (EPIPEN) 0.3 mg/0.3 mL auto-injector Inject 0.3 mg into the muscle once as needed for Anaphylaxis  FLUoxetine (PROZAC) 20 MG capsule Take 20 mg by mouth once daily  fluticasone propionate (FLONASE) 50 mcg/actuation nasal spray INHALE 2 PUFFS BY NASAL ROUTE DAILY  losartan (COZAAR) 100 MG tablet Take 100 mg by mouth once daily  methocarbamoL (ROBAXIN) 500 MG tablet Take 500 mg by mouth every 6 (six) hours as needed  montelukast (SINGULAIR)  10 mg tablet Take 10 mg by mouth every evening  OLANZapine (ZYPREXA) 2.5 MG tablet Take by mouth  oxyCODONE (ROXICODONE) 5 MG immediate release tablet Take by  mouth  thiamine (VITAMIN B-1) 100 MG tablet Take 100 mg by mouth once daily  topiramate (TOPAMAX) 200 MG tablet Take 200 mg by mouth 2 (two) times daily  traZODone (DESYREL) 50 MG tablet TAKE 2 TABLET (50 MG) BY ORAL ROUTE ONCE DAILY AT BEDTIME FOR 90 DAYS   No current facility-administered medications on file prior to visit.   Family History  Problem Relation Age of Onset  Breast cancer Mother  High blood pressure (Hypertension) Father  Diabetes Father    Social History   Tobacco Use  Smoking Status Former  Types: Cigarettes  Smokeless Tobacco Never    Social History   Socioeconomic History  Marital status: Married  Tobacco Use  Smoking status: Former  Types: Cigarettes  Smokeless tobacco: Never  Substance and Sexual Activity  Alcohol use: Never  Drug use: Never   Social Determinants of Health   Food Insecurity: No Food Insecurity (11/02/2021)  Received from [facility]  Hunger Vital Sign  Worried About Running Out of Food in the Last Year: Never true  Ran Out of Food in the Last Year: Never true  Transportation Needs: No Transportation Needs (11/02/2021)  Received from [facility] - Transportation  Lack of Transportation (Medical): No  Lack of Transportation (Non-Medical): No  Received from [facility]  Social Network   Objective:   Vitals:  07/18/22 0949 07/18/22 0956  BP: 119/73  Pulse: 63  Temp: 36.8 C (98.2 F)  SpO2: 98%  Weight: 70.2 kg (154 lb 12.8 oz)  Height: 170.2 cm (5\' 7" )  PainSc: 0-No pain  PainLoc: Abdomen   Body mass index is 24.25 kg/m. Physical Exam Constitutional:  Appearance: Normal appearance.  HENT:  Head: Normocephalic and atraumatic.  Nose: Nose normal. No congestion.  Mouth/Throat:  Mouth: Mucous membranes are moist.  Pharynx: Oropharynx is clear.  Eyes:  Pupils: Pupils are equal, round, and reactive to light.  Cardiovascular:  Rate and Rhythm: Normal rate and regular rhythm.  Pulses: Normal pulses.   Heart sounds: Normal heart sounds. No murmur heard. No friction rub. No gallop.  Pulmonary:  Effort: Pulmonary effort is normal. No respiratory distress.  Breath sounds: Normal breath sounds. No stridor. No wheezing, rhonchi or rales.  Abdominal:  General: Abdomen is flat.  Hernia: A hernia is present. Hernia is present in the right inguinal area.  Musculoskeletal:  General: Normal range of motion.  Cervical back: Normal range of motion.  Skin: General: Skin is warm and dry.  Neurological:  General: No focal deficit present.  Mental Status: He is alert and oriented to person, place, and time.  Psychiatric:  Mood and Affect: Mood normal.  Thought Content: Thought content normal.     Assessment and Plan:  Diagnoses and all orders for this visit:  Unilateral inguinal hernia without obstruction or gangrene, recurrence not specified   Gregory [name] is a 62 y.o. male   We will proceed to the OR for a laparoscopic right inguinal hernia repair with mesh. All risks and benefits were discussed with the patient, to generally include infection, bleeding, damage to surrounding structures, acute and chronic nerve pain, and recurrence. Alternatives were offered and described. All questions were answered and the patient voiced understanding of the procedure and wishes to proceed at this point.  No follow-ups on file.  Axel Filler,  MD, [facility], [place] [facility]

## 2022-09-09 ENCOUNTER — Ambulatory Visit (HOSPITAL_BASED_OUTPATIENT_CLINIC_OR_DEPARTMENT_OTHER): Payer: 59 | Admitting: Certified Registered Nurse Anesthetist

## 2022-09-09 ENCOUNTER — Encounter (HOSPITAL_COMMUNITY)
Admission: RE | Disposition: A | Discharge status: Home / Self Care | Payer: Self-pay | Source: Ambulatory Visit | Attending: General Surgery

## 2022-09-09 ENCOUNTER — Other Ambulatory Visit: Payer: Self-pay

## 2022-09-09 ENCOUNTER — Ambulatory Visit (HOSPITAL_COMMUNITY)
Admission: RE | Admit: 2022-09-09 | Discharge: 2022-09-09 | Disposition: A | Discharge status: Home / Self Care | Payer: 59 | Source: Ambulatory Visit | Attending: General Surgery | Admitting: General Surgery

## 2022-09-09 ENCOUNTER — Encounter (HOSPITAL_COMMUNITY): Payer: Self-pay | Admitting: General Surgery

## 2022-09-09 ENCOUNTER — Ambulatory Visit (HOSPITAL_COMMUNITY): Payer: 59 | Admitting: Certified Registered Nurse Anesthetist

## 2022-09-09 DIAGNOSIS — Z87891 Personal history of nicotine dependence: Secondary | ICD-10-CM | POA: Diagnosis not present

## 2022-09-09 DIAGNOSIS — F319 Bipolar disorder, unspecified: Secondary | ICD-10-CM | POA: Insufficient documentation

## 2022-09-09 DIAGNOSIS — F419 Anxiety disorder, unspecified: Secondary | ICD-10-CM | POA: Diagnosis not present

## 2022-09-09 DIAGNOSIS — G4730 Sleep apnea, unspecified: Secondary | ICD-10-CM | POA: Insufficient documentation

## 2022-09-09 DIAGNOSIS — K4090 Unilateral inguinal hernia, without obstruction or gangrene, not specified as recurrent: Secondary | ICD-10-CM | POA: Diagnosis not present

## 2022-09-09 DIAGNOSIS — E785 Hyperlipidemia, unspecified: Secondary | ICD-10-CM

## 2022-09-09 DIAGNOSIS — K219 Gastro-esophageal reflux disease without esophagitis: Secondary | ICD-10-CM | POA: Diagnosis not present

## 2022-09-09 DIAGNOSIS — I10 Essential (primary) hypertension: Secondary | ICD-10-CM | POA: Diagnosis not present

## 2022-09-09 DIAGNOSIS — M1990 Unspecified osteoarthritis, unspecified site: Secondary | ICD-10-CM | POA: Diagnosis not present

## 2022-09-09 DIAGNOSIS — Z79899 Other long term (current) drug therapy: Secondary | ICD-10-CM | POA: Diagnosis not present

## 2022-09-09 DIAGNOSIS — J4489 Other specified chronic obstructive pulmonary disease: Secondary | ICD-10-CM | POA: Diagnosis not present

## 2022-09-09 HISTORY — PX: INGUINAL HERNIA REPAIR: SHX194

## 2022-09-09 SURGERY — REPAIR, HERNIA, INGUINAL, LAPAROSCOPIC
Anesthesia: General | Laterality: Right

## 2022-09-09 MED ORDER — KETAMINE HCL 50 MG/5ML IJ SOSY
PREFILLED_SYRINGE | INTRAMUSCULAR | Status: AC
  Filled 2022-09-09: qty 5

## 2022-09-09 MED ORDER — ONDANSETRON HCL 4 MG/2ML IJ SOLN
INTRAMUSCULAR | Status: AC
  Filled 2022-09-09: qty 2

## 2022-09-09 MED ORDER — OXYCODONE HCL 5 MG PO TABS: 5.0000 mg | ORAL_TABLET | Freq: Once | ORAL | Status: DC | PRN

## 2022-09-09 MED ORDER — LIDOCAINE HCL (PF) 2 % IJ SOLN
INTRAMUSCULAR | Status: AC
  Filled 2022-09-09: qty 5

## 2022-09-09 MED ORDER — CEFAZOLIN SODIUM-DEXTROSE 2-4 GM/100ML-% IV SOLN
2.0000 g | INTRAVENOUS | Status: AC
  Administered 2022-09-09: 2 g via INTRAVENOUS
  Filled 2022-09-09: qty 100

## 2022-09-09 MED ORDER — CHLORHEXIDINE GLUCONATE CLOTH 2 % EX PADS: 6.0000 | MEDICATED_PAD | Freq: Once | CUTANEOUS | Status: DC

## 2022-09-09 MED ORDER — LACTATED RINGERS IV SOLN: INTRAVENOUS | Status: DC

## 2022-09-09 MED ORDER — HYDROMORPHONE HCL 1 MG/ML IJ SOLN
INTRAMUSCULAR | Status: AC
  Filled 2022-09-09: qty 1

## 2022-09-09 MED ORDER — BUPIVACAINE HCL (PF) 0.25 % IJ SOLN
INTRAMUSCULAR | Status: DC | PRN
  Administered 2022-09-09: 5 mL

## 2022-09-09 MED ORDER — OXYCODONE HCL 5 MG/5ML PO SOLN: 5.0000 mg | Freq: Once | ORAL | Status: DC | PRN

## 2022-09-09 MED ORDER — ORAL CARE MOUTH RINSE: 15.0000 mL | Freq: Once | OROMUCOSAL | Status: AC

## 2022-09-09 MED ORDER — DEXAMETHASONE SODIUM PHOSPHATE 4 MG/ML IJ SOLN
INTRAMUSCULAR | Status: DC | PRN
  Administered 2022-09-09: 8 mg via INTRAVENOUS

## 2022-09-09 MED ORDER — SUGAMMADEX SODIUM 200 MG/2ML IV SOLN
INTRAVENOUS | Status: DC | PRN
  Administered 2022-09-09: 200 mg via INTRAVENOUS
  Administered 2022-09-09: 100 mg via INTRAVENOUS

## 2022-09-09 MED ORDER — ACETAMINOPHEN 500 MG PO TABS
1000.0000 mg | ORAL_TABLET | ORAL | Status: AC
  Administered 2022-09-09: 1000 mg via ORAL
  Filled 2022-09-09: qty 2

## 2022-09-09 MED ORDER — MIDAZOLAM HCL 5 MG/5ML IJ SOLN
INTRAMUSCULAR | Status: DC | PRN
  Administered 2022-09-09 (×2): 1 mg via INTRAVENOUS

## 2022-09-09 MED ORDER — TRAMADOL HCL 50 MG PO TABS: 50.0000 mg | ORAL_TABLET | Freq: Four times a day (QID) | ORAL | 0 refills | Status: DC | PRN

## 2022-09-09 MED ORDER — HYDROMORPHONE HCL 1 MG/ML IJ SOLN
0.2500 mg | INTRAMUSCULAR | Status: DC | PRN
  Administered 2022-09-09: 0.25 mg via INTRAVENOUS

## 2022-09-09 MED ORDER — ONDANSETRON HCL 4 MG/2ML IJ SOLN: 4.0000 mg | Freq: Once | INTRAMUSCULAR | Status: DC | PRN

## 2022-09-09 MED ORDER — KETAMINE HCL 10 MG/ML IJ SOLN
INTRAMUSCULAR | Status: DC | PRN
  Administered 2022-09-09: 10 mg via INTRAVENOUS

## 2022-09-09 MED ORDER — MIDAZOLAM HCL 2 MG/2ML IJ SOLN
INTRAMUSCULAR | Status: AC
  Filled 2022-09-09: qty 2

## 2022-09-09 MED ORDER — PROPOFOL 10 MG/ML IV BOLUS
INTRAVENOUS | Status: DC | PRN
  Administered 2022-09-09: 140 mg via INTRAVENOUS

## 2022-09-09 MED ORDER — ENSURE PRE-SURGERY PO LIQD: 296.0000 mL | Freq: Once | ORAL | Status: DC

## 2022-09-09 MED ORDER — ROCURONIUM BROMIDE 10 MG/ML (PF) SYRINGE
PREFILLED_SYRINGE | INTRAVENOUS | Status: AC
  Filled 2022-09-09: qty 10

## 2022-09-09 MED ORDER — EPHEDRINE SULFATE-NACL 50-0.9 MG/10ML-% IV SOSY
PREFILLED_SYRINGE | INTRAVENOUS | Status: DC | PRN
  Administered 2022-09-09: 5 mg via INTRAVENOUS

## 2022-09-09 MED ORDER — KETOROLAC TROMETHAMINE 30 MG/ML IJ SOLN
INTRAMUSCULAR | Status: AC
  Filled 2022-09-09: qty 1

## 2022-09-09 MED ORDER — ROCURONIUM BROMIDE 10 MG/ML (PF) SYRINGE
PREFILLED_SYRINGE | INTRAVENOUS | Status: DC | PRN
  Administered 2022-09-09: 60 mg via INTRAVENOUS
  Administered 2022-09-09: 30 mg via INTRAVENOUS

## 2022-09-09 MED ORDER — BUPIVACAINE HCL (PF) 0.25 % IJ SOLN
INTRAMUSCULAR | Status: AC
  Filled 2022-09-09: qty 30

## 2022-09-09 MED ORDER — 0.9 % SODIUM CHLORIDE (POUR BTL) OPTIME
TOPICAL | Status: DC | PRN
  Administered 2022-09-09: 1000 mL

## 2022-09-09 MED ORDER — FENTANYL CITRATE (PF) 100 MCG/2ML IJ SOLN
INTRAMUSCULAR | Status: DC | PRN
  Administered 2022-09-09: 50 ug via INTRAVENOUS

## 2022-09-09 MED ORDER — ONDANSETRON HCL 4 MG/2ML IJ SOLN
INTRAMUSCULAR | Status: DC | PRN
Start: 2022-09-09 — End: 2022-09-09
  Administered 2022-09-09: 4 mg via INTRAVENOUS

## 2022-09-09 MED ORDER — LIDOCAINE 2% (20 MG/ML) 5 ML SYRINGE
INTRAMUSCULAR | Status: DC | PRN
  Administered 2022-09-09: 80 mg via INTRAVENOUS

## 2022-09-09 MED ORDER — KETOROLAC TROMETHAMINE 30 MG/ML IJ SOLN
30.0000 mg | Freq: Once | INTRAMUSCULAR | Status: AC | PRN
  Administered 2022-09-09: 30 mg via INTRAVENOUS

## 2022-09-09 MED ORDER — FENTANYL CITRATE (PF) 250 MCG/5ML IJ SOLN
INTRAMUSCULAR | Status: AC
  Filled 2022-09-09: qty 5

## 2022-09-09 MED ORDER — DEXAMETHASONE SODIUM PHOSPHATE 10 MG/ML IJ SOLN
INTRAMUSCULAR | Status: AC
  Filled 2022-09-09: qty 1

## 2022-09-09 MED ORDER — PHENYLEPHRINE 80 MCG/ML (10ML) SYRINGE FOR IV PUSH (FOR BLOOD PRESSURE SUPPORT)
PREFILLED_SYRINGE | INTRAVENOUS | Status: AC
  Filled 2022-09-09: qty 10

## 2022-09-09 MED ORDER — CHLORHEXIDINE GLUCONATE 0.12 % MT SOLN
15.0000 mL | Freq: Once | OROMUCOSAL | Status: AC
  Administered 2022-09-09: 15 mL via OROMUCOSAL

## 2022-09-09 MED ORDER — PHENYLEPHRINE 80 MCG/ML (10ML) SYRINGE FOR IV PUSH (FOR BLOOD PRESSURE SUPPORT)
PREFILLED_SYRINGE | INTRAVENOUS | Status: DC | PRN
  Administered 2022-09-09 (×4): 80 ug via INTRAVENOUS

## 2022-09-09 SURGICAL SUPPLY — 39 items
ADH SKN CLS APL DERMABOND .7 (GAUZE/BANDAGES/DRESSINGS) ×1
APL PRP STRL LF DISP 70% ISPRP (MISCELLANEOUS) ×1
APPLIER CLIP 5 13 M/L LIGAMAX5 (MISCELLANEOUS)
APR CLP MED LRG 5 ANG JAW (MISCELLANEOUS)
CABLE HIGH FREQUENCY MONO STRZ (ELECTRODE) ×1 IMPLANT
CHLORAPREP W/TINT 26 (MISCELLANEOUS) ×1 IMPLANT
CLIP APPLIE 5 13 M/L LIGAMAX5 (MISCELLANEOUS) IMPLANT
DERMABOND ADVANCED .7 DNX12 (GAUZE/BANDAGES/DRESSINGS) ×1 IMPLANT
DISSECTOR BLUNT TIP ENDO 5MM (MISCELLANEOUS) IMPLANT
ELECT REM PT RETURN 15FT ADLT (MISCELLANEOUS) ×1 IMPLANT
ENDOLOOP SUT PDS II 0 18 (SUTURE) IMPLANT
GLOVE BIO SURGEON STRL SZ7.5 (GLOVE) ×1 IMPLANT
GOWN STRL REUS W/ TWL XL LVL3 (GOWN DISPOSABLE) ×2 IMPLANT
GOWN STRL REUS W/TWL XL LVL3 (GOWN DISPOSABLE) ×2
IRRIG SUCT STRYKERFLOW 2 WTIP (MISCELLANEOUS)
IRRIGATION SUCT STRKRFLW 2 WTP (MISCELLANEOUS) IMPLANT
KIT BASIN OR (CUSTOM PROCEDURE TRAY) ×1 IMPLANT
KIT TURNOVER KIT A (KITS) IMPLANT
MARKER SKIN DUAL TIP RULER LAB (MISCELLANEOUS) ×1 IMPLANT
MESH 3DMAX MID 4X6 RT LRG (Mesh General) IMPLANT
NDL INSUFFLATION 14GA 120MM (NEEDLE) IMPLANT
NEEDLE INSUFFLATION 14GA 120MM (NEEDLE)
RELOAD STAPLE 4.0 BLU F/HERNIA (INSTRUMENTS) ×1 IMPLANT
RELOAD STAPLE 4.8 BLK F/HERNIA (STAPLE) IMPLANT
RELOAD STAPLE HERNIA 4.0 BLUE (INSTRUMENTS) ×1
RELOAD STAPLE HERNIA 4.8 BLK (STAPLE)
SCISSORS LAP 5X35 DISP (ENDOMECHANICALS) IMPLANT
SET TUBE SMOKE EVAC HIGH FLOW (TUBING) ×1 IMPLANT
SPIKE FLUID TRANSFER (MISCELLANEOUS) ×1 IMPLANT
STAPLER HERNIA 12 8.5 360D (INSTRUMENTS) ×1 IMPLANT
SUT MNCRL AB 4-0 PS2 18 (SUTURE) ×1 IMPLANT
SUT VIC AB 1 CT1 27 (SUTURE)
SUT VIC AB 1 CT1 27XBRD ANBCTR (SUTURE) IMPLANT
TOWEL OR 17X26 10 PK STRL BLUE (TOWEL DISPOSABLE) ×1 IMPLANT
TOWEL OR NON WOVEN STRL DISP B (DISPOSABLE) ×1 IMPLANT
TRAY LAPAROSCOPIC (CUSTOM PROCEDURE TRAY) ×1 IMPLANT
TROCAR OPTICAL SHORT 5MM (TROCAR) ×1 IMPLANT
TROCAR OPTICAL SLV SHORT 5MM (TROCAR) ×1 IMPLANT
TROCAR Z THREAD OPTICAL 12X100 (TROCAR) ×1 IMPLANT

## 2022-09-09 NOTE — Op Note (Signed)
09/09/2022  2:13 PM  PATIENT:  Gregory [name]  62 y.o. male  PRE-OPERATIVE DIAGNOSIS:  RIGHT INGUINAL HERNIA  POST-OPERATIVE DIAGNOSIS:  RIGHT INGUINAL HERNIA  PROCEDURE:  Procedure(s): LAPAROSCOPIC RIGHT INGUINAL HERNIA REPAIR WITH MESH (Right)  SURGEON:  Surgeons and Role:    * Axel Filler, MD - Primary  PHYSICIAN ASSISTANT:   ASSISTANTS: none   ANESTHESIA:   local and general  EBL:  5 mL   BLOOD ADMINISTERED:none  DRAINS: none   LOCAL MEDICATIONS USED:  BUPIVICAINE   SPECIMEN:  No Specimen  DISPOSITION OF SPECIMEN:  N/A  COUNTS:  YES  TOURNIQUET:  * No tourniquets in log *  DICTATION: .Dragon Dictation  Counts: reported as correct x 2  Findings:  The patient had a small right direct hernia and cord lipoma  Indications for procedure:  The patient is a 62 year old male with a right hernia for several months. Patient complained of symptomatology to his right inguinal area. The patient was taken back for elective inguinal hernia repair.  Details of the procedure: The patient was taken back to the operating room. The patient was placed in supine position with bilateral SCDs in place.  The patient was prepped and draped in the usual sterile fashion.  After appropriate anitbiotics were confirmed, a time-out was confirmed and all facts were verified.  0.25% Marcaine was used to infiltrate the umbilical area. A 11-blade was used to cut down the skin and blunt dissection was used to get the anterior fashion.  The anterior fascia was incised approximately 1 cm and the muscles were retracted laterally. Blunt dissection was then used to create a space in the preperitoneal area. At this time a 10 mm camera was then introduced into the space and advanced the pubic tubercle and a 12 mm trocar was placed over this and insufflation was started.  At this time and space was created from medial to laterally the preperitoneal space.  Cooper's ligament was initially cleaned off.  The  hernia was identified in the direct space. Dissection of the hernia sac was undertaken and it was fully reduced.  The transversalis fascia retracted spontaneously.    The spermatic cord and the vas deferens was identified and protected in all parts of the case. Cremasterics were dissected off from the cord.    The peritoneum was taken down to approximately the umbilicus a Bard 3D MID mesh, size: Large, was  introduced into the preperitoneal space.  The mesh was brought over to cover the direct and indirect hernia spaces.  This was anchored into place and secured to Cooper's ligament with 4.19mm staples from a Coviden hernia stapler. It was anchored to the anterior abdominal wall with 4.8 mm staples. The peritoneum was seen lying posterior to the mesh. There was no staples placed laterally. The insufflation was evacuated and the peritoneum was seen posterior to the mesh. The trochars were removed. The anterior fascia was reapproximated using #1 Vicryl on a UR- 6.  Intra-abdominal air was evacuated and the Veress needle removed. The skin was reapproximated using 4-0 Monocryl subcuticular fashion.  The skin was dressed with Dermabond.  The patient was awakened from general anesthesia and taken to recovery in stable condition.   PLAN OF CARE: Discharge to home after PACU  PATIENT DISPOSITION:  PACU - hemodynamically stable.   Delay start of Pharmacological VTE agent (>24hrs) due to surgical blood loss or risk of bleeding: not applicable

## 2022-09-09 NOTE — Interval H&P Note (Signed)
History and Physical Interval Note:  09/09/2022 1:11 PM  Gregory [name]  has presented today for surgery, with the diagnosis of RIGHT INGUINAL HERNIA.  The various methods of treatment have been discussed with the patient and family. After consideration of risks, benefits and other options for treatment, the patient has consented to  Procedure(s): LAPAROSCOPIC RIGHT INGUINAL HERNIA REPAIR WITH MESH (Right) as a surgical intervention.  The patient's history has been reviewed, patient examined, no change in status, stable for surgery.  I have reviewed the patient's chart and labs.  Questions were answered to the patient's satisfaction.     Axel Filler

## 2022-09-09 NOTE — Anesthesia Preprocedure Evaluation (Addendum)
Anesthesia Evaluation  Patient identified by MRN, date of birth, ID band Patient awake    Reviewed: Allergy & Precautions, NPO status , Patient's Chart, lab work & pertinent test results  History of Anesthesia Complications Negative for: history of anesthetic complications  Airway Mallampati: II  TM Distance: >3 FB Neck ROM: Full    Dental no notable dental hx. (+) Dental Advisory Given, Upper Dentures, Partial Lower, Implants   Pulmonary asthma , sleep apnea , COPD,  COPD inhaler, Patient abstained from smoking., former smoker   Pulmonary exam normal breath sounds clear to auscultation       Cardiovascular hypertension, Pt. on medications Normal cardiovascular exam Rhythm:Regular Rate:Normal  11/2019 Echo  1. Left ventricular ejection fraction, by estimation, is 65 to 70%. The  left ventricle has normal function. The left ventricle has no regional  wall motion abnormalities. Left ventricular diastolic parameters were  normal. The average left ventricular  global longitudinal strain is -25.1 %. The global longitudinal strain is  normal.   2. Right ventricular systolic function is normal. The right ventricular  size is normal.   3. The mitral valve is normal in structure. Trivial mitral valve  regurgitation. No evidence of mitral stenosis.   4. The aortic valve is normal in structure. Aortic valve regurgitation is  not visualized. No aortic stenosis is present.   5. The inferior vena cava is normal in size with greater than 50%  respiratory variability, suggesting right atrial pressure of 3 mmHg.     Neuro/Psych  Headaches PSYCHIATRIC DISORDERS Anxiety Depression Bipolar Disorder      GI/Hepatic Neg liver ROS,GERD  Controlled and Medicated,,  Endo/Other  negative endocrine ROS    Renal/GU negative Renal ROSLab Results      Component                Value               Date                         K                         4.2                 09/02/2022                CO2                      28                  09/02/2022                BUN                      5 (L)               09/02/2022                CREATININE               0.91                09/02/2022                     Musculoskeletal  (+) Arthritis ,    Abdominal   Peds  Hematology  (+) REFUSES BLOOD PRODUCTS, JEHOVAH'S WITNESSLab Results  Component                Value               Date                      WBC                      6.6                 09/02/2022                HGB                      13.0                09/02/2022                HCT                      40.9                09/02/2022                MCV                      91.3                09/02/2022                PLT                      306                 09/02/2022              Anesthesia Other Findings Lisinopril  Reproductive/Obstetrics                             Anesthesia Physical Anesthesia Plan  ASA: 3  Anesthesia Plan: General   Post-op Pain Management: Tylenol PO (pre-op)*, Precedex and Toradol IV (intra-op)*   Induction: Intravenous  PONV Risk Score and Plan: 2 and Treatment may vary due to age or medical condition, Ondansetron, Dexamethasone and Midazolam  Airway Management Planned: Oral ETT  Additional Equipment: None  Intra-op Plan:   Post-operative Plan: Extubation in OR  Informed Consent: I have reviewed the patients History and Physical, chart, labs and discussed the procedure including the risks, benefits and alternatives for the proposed anesthesia with the patient or authorized representative who has indicated his/her understanding and acceptance.     Dental advisory given  Plan Discussed with: CRNA and Anesthesiologist  Anesthesia Plan Comments:         Anesthesia Quick Evaluation

## 2022-09-09 NOTE — Transfer of Care (Signed)
Immediate Anesthesia Transfer of Care Note  [name]  Procedure(s) Performed: Procedure(s): LAPAROSCOPIC RIGHT INGUINAL HERNIA REPAIR WITH MESH (Right)  Patient Location: PACU  Anesthesia Type:General  Level of Consciousness: Patient easily awoken, sedated, comfortable, cooperative, following commands, responds to stimulation.   Airway & Oxygen Therapy: Patient spontaneously breathing, ventilating well, oxygen via simple oxygen mask.  Post-op Assessment: Report given to PACU RN, vital signs reviewed and stable, moving all extremities.   Post vital signs: Reviewed and stable.  Complications: No apparent anesthesia complications Last Vitals:  Vitals Value Taken Time  BP    Temp    Pulse 67 09/09/22 1428  Resp 14 09/09/22 1428  SpO2 100 % 09/09/22 1428  Vitals shown include unfiled device data.  Last Pain:  Vitals:   09/09/22 1210  TempSrc:   PainSc: 5          Complications: No notable events documented.

## 2022-09-09 NOTE — Anesthesia Procedure Notes (Signed)
Procedure Name: Intubation Date/Time: 09/09/2022 1:34 PM  Performed by: Ludwig Lean, CRNAPre-anesthesia Checklist: Patient identified, Emergency Drugs available, Suction available and Patient being monitored Patient Re-evaluated:Patient Re-evaluated prior to induction Oxygen Delivery Method: Circle system utilized Preoxygenation: Pre-oxygenation with 100% oxygen Induction Type: IV induction Ventilation: Mask ventilation without difficulty Laryngoscope Size: Mac and 4 Grade View: Grade I Tube type: Oral Tube size: 7.5 mm Number of attempts: 1 Airway Equipment and Method: Stylet Placement Confirmation: ETT inserted through vocal cords under direct vision, positive ETCO2 and breath sounds checked- equal and bilateral Secured at: 21 cm Tube secured with: Tape Dental Injury: Teeth and Oropharynx as per pre-operative assessment

## 2022-09-09 NOTE — Discharge Instructions (Signed)
[facility] _______[facility] ? ?INGUINAL HERNIA REPAIR: POST OP INSTRUCTIONS ? ?Always review your discharge instruction sheet given to you by the facility where your surgery was performed. ?IF YOU HAVE DISABILITY OR FAMILY LEAVE FORMS, YOU MUST BRING THEM TO THE OFFICE FOR PROCESSING.   ?DO NOT GIVE THEM TO YOUR DOCTOR. ? ?1. A  prescription for pain medication may be given to you upon discharge.  Take your pain medication as prescribed, if needed.  If narcotic pain medicine is not needed, then you may take acetaminophen (Tylenol) or ibuprofen (Advil) as needed. ?2. Take your usually prescribed medications unless otherwise directed. ?If you need a refill on your pain medication, please contact your pharmacy.  They will contact our office to request authorization. Prescriptions will not be filled after 5 pm or on week-ends. ?3. You should follow a light diet the first 24 hours after arrival home, such as soup and crackers, etc.  Be sure to include lots of fluids daily.  Resume your normal diet the day after surgery. ?4.Most patients will experience some swelling and bruising around the umbilicus or in the groin and scrotum.  Ice packs and reclining will help.  Swelling and bruising can take several days to resolve.  ?6. It is common to experience some constipation if taking pain medication after surgery.  Increasing fluid intake and taking a stool softener (such as Colace) will usually help or prevent this problem from occurring.  A mild laxative (Milk of Magnesia or Miralax) should be taken according to package directions if there are no bowel movements after 48 hours. ?7. Unless discharge instructions indicate otherwise, you may remove your bandages 24-48 hours after surgery, and you may shower at that time.  You may have steri-strips (small skin tapes) in place directly over the incision.  These strips should be left on the skin for 7-10 days.  If your surgeon used skin glue on the incision, you may  shower in 24 hours.  The glue will flake off over the next 2-3 weeks.  Any sutures or staples will be removed at the office during your follow-up visit. ?8. ACTIVITIES:  You may resume regular (light) daily activities beginning the next day--such as daily self-care, walking, climbing stairs--gradually increasing activities as tolerated.  You may have sexual intercourse when it is comfortable.  Refrain from any heavy lifting or straining until approved by your doctor. ? ?a.You may drive when you are no longer taking prescription pain medication, you can comfortably wear a seatbelt, and you can safely maneuver your car and apply brakes. ?b.RETURN TO WORK:   ?_____________________________________________ ? ?9.You should see your doctor in the office for a follow-up appointment approximately 2-3 weeks after your surgery.  Make sure that you call for this appointment within a day or two after you arrive home to insure a convenient appointment time. ?10.OTHER INSTRUCTIONS: _________________________ ?   _____________________________________ ? ?WHEN TO CALL YOUR DOCTOR: ?Fever over 101.0 ?Inability to urinate ?Nausea and/or vomiting ?Extreme swelling or bruising ?Continued bleeding from incision. ?Increased pain, redness, or drainage from the incision ? ?The clinic staff is available to answer your questions during regular business hours.  Please don?t hesitate to call and ask to speak to one of the nurses for clinical concerns.  If you have a medical emergency, go to the nearest emergency room or call 911.  A surgeon from [facility] is always on call at the hospital ? ? ?[address], [address], [place], [place]    [address] ? ? [address] [address], [place], [place]   [address] ?[phone] ? [phone] ? FAX [phone] ?Web site: [email or web address] ? ?

## 2022-09-09 NOTE — Anesthesia Postprocedure Evaluation (Signed)
Anesthesia Post Note  [name]  Procedure(s) Performed: LAPAROSCOPIC RIGHT INGUINAL HERNIA REPAIR WITH MESH (Right)     Patient location during evaluation: PACU Anesthesia Type: General Level of consciousness: awake and alert Pain management: pain level controlled Vital Signs Assessment: post-procedure vital signs reviewed and stable Respiratory status: spontaneous breathing, nonlabored ventilation, respiratory function stable and patient connected to nasal cannula oxygen Cardiovascular status: blood pressure returned to baseline and stable Postop Assessment: no apparent nausea or vomiting Anesthetic complications: no   No notable events documented.  Last Vitals:  Vitals:   09/09/22 1445 09/09/22 1500  BP: 137/83 120/87  Pulse: (!) 47 (!) 48  Resp: 14 11  Temp:    SpO2: 95% 90%    Last Pain:  Vitals:   09/09/22 1445  TempSrc:   PainSc: 8                  [name]

## 2022-09-10 ENCOUNTER — Encounter (HOSPITAL_COMMUNITY): Payer: Self-pay | Admitting: General Surgery

## 2022-09-12 ENCOUNTER — Telehealth: Payer: Self-pay | Admitting: Orthopedic Surgery

## 2022-09-12 NOTE — Telephone Encounter (Signed)
Patient seen 09-04-22 and is now calling to schedule left shoulder surgery ASAP.  Please provide surgery order.

## 2022-09-15 NOTE — Telephone Encounter (Signed)
Done thx

## 2022-09-18 ENCOUNTER — Encounter (HOSPITAL_COMMUNITY): Payer: Self-pay | Admitting: General Surgery

## 2022-09-22 ENCOUNTER — Other Ambulatory Visit: Payer: Self-pay

## 2022-09-22 ENCOUNTER — Encounter (HOSPITAL_COMMUNITY): Payer: Self-pay | Admitting: Orthopedic Surgery

## 2022-09-22 NOTE — Anesthesia Preprocedure Evaluation (Signed)
Anesthesia Evaluation  Patient identified by MRN, date of birth, ID band Patient awake    Reviewed: Allergy & Precautions, NPO status , Patient's Chart, lab work & pertinent test results  History of Anesthesia Complications Negative for: history of anesthetic complications  Airway Mallampati: II  TM Distance: >3 FB Neck ROM: Full    Dental no notable dental hx.    Pulmonary asthma , COPD,  COPD inhaler, Patient abstained from smoking., former smoker   Pulmonary exam normal        Cardiovascular hypertension, Pt. on medications Normal cardiovascular exam     Neuro/Psych  Headaches  Anxiety Depression Bipolar Disorder      GI/Hepatic Neg liver ROS,GERD  Controlled,,  Endo/Other  negative endocrine ROS    Renal/GU negative Renal ROS  negative genitourinary   Musculoskeletal  (+) Arthritis  (left shoulder rotator cuff tear, biceps tendinitis, acromioclavicular osteoarthritis),    Abdominal   Peds  Hematology  (+) REFUSES BLOOD PRODUCTS, JEHOVAH'S WITNESS  Anesthesia Other Findings Day of surgery medications reviewed with patient.  Reproductive/Obstetrics negative OB ROS                              Anesthesia Physical Anesthesia Plan  ASA: 2  Anesthesia Plan: General   Post-op Pain Management: Regional block* and Tylenol PO (pre-op)*   Induction: Intravenous  PONV Risk Score and Plan: 2 and Ondansetron, Dexamethasone, Treatment may vary due to age or medical condition and Midazolam  Airway Management Planned: Oral ETT  Additional Equipment: None  Intra-op Plan:   Post-operative Plan: Extubation in OR  Informed Consent: I have reviewed the patients History and Physical, chart, labs and discussed the procedure including the risks, benefits and alternatives for the proposed anesthesia with the patient or authorized representative who has indicated his/her understanding and  acceptance.     Dental advisory given  Plan Discussed with: CRNA  Anesthesia Plan Comments:          Anesthesia Quick Evaluation

## 2022-09-22 NOTE — Progress Notes (Signed)
Spoke with patient's wife Geri Seminole for PAT information and instructions for DOS.  PCP - Hillery Aldo, NP Cardiologist - n/a  Chest x-ray - n/a EKG - 10/08/21 Stress Test - 03/09/09 ECHO - 12/12/19 Cardiac Cath - n/a  ICD Pacemaker/Loop - n/a  Sleep Study -  Yes CPAP - does not use CPAP  Diabetes Type - n/a  ERAS: Clear liquids til 4:30 AM DOS.  STOP now taking any Aspirin (unless otherwise instructed by your surgeon), Aleve, Naproxen, Ibuprofen, Motrin, Advil, Goody's, BC's, all herbal medications, fish oil, and all vitamins.   Coronavirus Screening Do you have any of the following symptoms:  Cough yes/no: No Fever (>100.21F)  yes/no: No Runny nose yes/no: No Sore throat yes/no: No Difficulty breathing/shortness of breath  yes/no: No  Have you traveled in the last 14 days and where? yes/no: No  Patient's Wife Geri Seminole verbalized understanding of instructions that were given via phone.

## 2022-09-23 ENCOUNTER — Encounter (HOSPITAL_COMMUNITY): Payer: Self-pay | Admitting: Orthopedic Surgery

## 2022-09-23 ENCOUNTER — Observation Stay (HOSPITAL_COMMUNITY)
Admission: RE | Admit: 2022-09-23 | Discharge: 2022-09-24 | Disposition: A | Discharge status: Home / Self Care | Payer: 59 | Source: Ambulatory Visit | Attending: Orthopedic Surgery | Admitting: Orthopedic Surgery

## 2022-09-23 ENCOUNTER — Ambulatory Visit (HOSPITAL_BASED_OUTPATIENT_CLINIC_OR_DEPARTMENT_OTHER): Payer: 59 | Admitting: Anesthesiology

## 2022-09-23 ENCOUNTER — Encounter (HOSPITAL_COMMUNITY)
Admission: RE | Disposition: A | Discharge status: Home / Self Care | Payer: Self-pay | Source: Ambulatory Visit | Attending: Orthopedic Surgery

## 2022-09-23 ENCOUNTER — Ambulatory Visit (HOSPITAL_COMMUNITY): Payer: 59 | Admitting: Anesthesiology

## 2022-09-23 ENCOUNTER — Other Ambulatory Visit: Payer: Self-pay

## 2022-09-23 DIAGNOSIS — M65912 Unspecified synovitis and tenosynovitis, left shoulder: Secondary | ICD-10-CM

## 2022-09-23 DIAGNOSIS — M75102 Unspecified rotator cuff tear or rupture of left shoulder, not specified as traumatic: Secondary | ICD-10-CM | POA: Diagnosis not present

## 2022-09-23 DIAGNOSIS — I10 Essential (primary) hypertension: Secondary | ICD-10-CM | POA: Diagnosis not present

## 2022-09-23 DIAGNOSIS — M19012 Primary osteoarthritis, left shoulder: Principal | ICD-10-CM | POA: Insufficient documentation

## 2022-09-23 DIAGNOSIS — S43432A Superior glenoid labrum lesion of left shoulder, initial encounter: Secondary | ICD-10-CM

## 2022-09-23 DIAGNOSIS — Z79899 Other long term (current) drug therapy: Secondary | ICD-10-CM | POA: Insufficient documentation

## 2022-09-23 DIAGNOSIS — M7522 Bicipital tendinitis, left shoulder: Secondary | ICD-10-CM

## 2022-09-23 DIAGNOSIS — M659 Synovitis and tenosynovitis, unspecified: Secondary | ICD-10-CM

## 2022-09-23 DIAGNOSIS — M75122 Complete rotator cuff tear or rupture of left shoulder, not specified as traumatic: Secondary | ICD-10-CM

## 2022-09-23 DIAGNOSIS — Z96652 Presence of left artificial knee joint: Secondary | ICD-10-CM | POA: Insufficient documentation

## 2022-09-23 DIAGNOSIS — X58XXXA Exposure to other specified factors, initial encounter: Secondary | ICD-10-CM | POA: Diagnosis not present

## 2022-09-23 DIAGNOSIS — Z87891 Personal history of nicotine dependence: Secondary | ICD-10-CM | POA: Insufficient documentation

## 2022-09-23 DIAGNOSIS — J449 Chronic obstructive pulmonary disease, unspecified: Secondary | ICD-10-CM | POA: Diagnosis not present

## 2022-09-23 DIAGNOSIS — M19011 Primary osteoarthritis, right shoulder: Secondary | ICD-10-CM

## 2022-09-23 DIAGNOSIS — Z9889 Other specified postprocedural states: Secondary | ICD-10-CM

## 2022-09-23 DIAGNOSIS — Z01818 Encounter for other preprocedural examination: Principal | ICD-10-CM

## 2022-09-23 HISTORY — PX: SHOULDER ARTHROSCOPY WITH OPEN ROTATOR CUFF REPAIR AND DISTAL CLAVICLE ACROMINECTOMY: SHX5683

## 2022-09-23 LAB — NO BLOOD PRODUCTS

## 2022-09-23 LAB — SURGICAL PCR SCREEN
MRSA, PCR: NEGATIVE
Staphylococcus aureus: NEGATIVE

## 2022-09-23 SURGERY — SHOULDER ARTHROSCOPY WITH OPEN ROTATOR CUFF REPAIR AND DISTAL CLAVICLE ACROMINECTOMY
Anesthesia: General | Site: Shoulder | Laterality: Left

## 2022-09-23 MED ORDER — EPHEDRINE SULFATE (PRESSORS) 50 MG/ML IJ SOLN
INTRAMUSCULAR | Status: DC | PRN
  Administered 2022-09-23 (×4): 5 mg via INTRAVENOUS

## 2022-09-23 MED ORDER — ASPIRIN 81 MG PO TBEC
81.0000 mg | DELAYED_RELEASE_TABLET | Freq: Every day | ORAL | Status: DC
  Administered 2022-09-23 – 2022-09-24 (×2): 81 mg via ORAL
  Filled 2022-09-23 (×2): qty 1

## 2022-09-23 MED ORDER — FENTANYL CITRATE (PF) 250 MCG/5ML IJ SOLN
INTRAMUSCULAR | Status: AC
  Filled 2022-09-23: qty 5

## 2022-09-23 MED ORDER — OXYCODONE HCL 5 MG PO TABS: 5.0000 mg | ORAL_TABLET | Freq: Once | ORAL | Status: DC | PRN

## 2022-09-23 MED ORDER — CELECOXIB 100 MG PO CAPS
100.0000 mg | ORAL_CAPSULE | Freq: Two times a day (BID) | ORAL | Status: DC
  Administered 2022-09-23 (×2): 100 mg via ORAL
  Filled 2022-09-23 (×3): qty 1

## 2022-09-23 MED ORDER — DOCUSATE SODIUM 100 MG PO CAPS
100.0000 mg | ORAL_CAPSULE | Freq: Two times a day (BID) | ORAL | Status: DC
  Administered 2022-09-23: 100 mg via ORAL
  Filled 2022-09-23 (×2): qty 1

## 2022-09-23 MED ORDER — ACETAMINOPHEN 500 MG PO TABS
1000.0000 mg | ORAL_TABLET | Freq: Once | ORAL | Status: AC
  Administered 2022-09-23: 1000 mg via ORAL
  Filled 2022-09-23: qty 2

## 2022-09-23 MED ORDER — MENTHOL 3 MG MT LOZG: 1.0000 | LOZENGE | OROMUCOSAL | Status: DC | PRN

## 2022-09-23 MED ORDER — ROCURONIUM BROMIDE 100 MG/10ML IV SOLN
INTRAVENOUS | Status: DC | PRN
  Administered 2022-09-23: 20 mg via INTRAVENOUS
  Administered 2022-09-23: 60 mg via INTRAVENOUS

## 2022-09-23 MED ORDER — ONDANSETRON HCL 4 MG/2ML IJ SOLN: 4.0000 mg | Freq: Four times a day (QID) | INTRAMUSCULAR | Status: DC | PRN

## 2022-09-23 MED ORDER — MIDAZOLAM HCL 2 MG/2ML IJ SOLN
INTRAMUSCULAR | Status: AC
  Filled 2022-09-23: qty 2

## 2022-09-23 MED ORDER — HYDROMORPHONE HCL 1 MG/ML IJ SOLN: 0.5000 mg | INTRAMUSCULAR | Status: DC | PRN

## 2022-09-23 MED ORDER — THIAMINE HCL 100 MG PO TABS
100.0000 mg | ORAL_TABLET | Freq: Every day | ORAL | Status: DC
  Filled 2022-09-23 (×2): qty 1

## 2022-09-23 MED ORDER — TRANEXAMIC ACID-NACL 1000-0.7 MG/100ML-% IV SOLN
1000.0000 mg | INTRAVENOUS | Status: AC
  Administered 2022-09-23: 1000 mg via INTRAVENOUS
  Filled 2022-09-23: qty 100

## 2022-09-23 MED ORDER — OLANZAPINE 2.5 MG PO TABS
2.5000 mg | ORAL_TABLET | Freq: Every day | ORAL | Status: DC
  Administered 2022-09-23: 2.5 mg via ORAL
  Filled 2022-09-23: qty 1

## 2022-09-23 MED ORDER — OXYCODONE HCL 5 MG PO TABS
5.0000 mg | ORAL_TABLET | ORAL | Status: DC | PRN
  Administered 2022-09-23 – 2022-09-24 (×5): 10 mg via ORAL
  Filled 2022-09-23 (×5): qty 2

## 2022-09-23 MED ORDER — CEFAZOLIN SODIUM-DEXTROSE 2-4 GM/100ML-% IV SOLN
2.0000 g | Freq: Three times a day (TID) | INTRAVENOUS | Status: AC
  Administered 2022-09-23 – 2022-09-24 (×3): 2 g via INTRAVENOUS
  Filled 2022-09-23 (×3): qty 100

## 2022-09-23 MED ORDER — EPINEPHRINE PF 1 MG/ML IJ SOLN
INTRAMUSCULAR | Status: AC
  Filled 2022-09-23: qty 2

## 2022-09-23 MED ORDER — EPHEDRINE 5 MG/ML INJ
INTRAVENOUS | Status: AC
  Filled 2022-09-23: qty 5

## 2022-09-23 MED ORDER — EPINEPHRINE PF 1 MG/ML IJ SOLN
INTRAMUSCULAR | Status: AC
  Filled 2022-09-23: qty 1

## 2022-09-23 MED ORDER — AMISULPRIDE (ANTIEMETIC) 5 MG/2ML IV SOLN: 10.0000 mg | Freq: Once | INTRAVENOUS | Status: DC | PRN

## 2022-09-23 MED ORDER — DEXAMETHASONE SODIUM PHOSPHATE 10 MG/ML IJ SOLN
INTRAMUSCULAR | Status: DC | PRN
  Administered 2022-09-23: 10 mg via INTRAVENOUS

## 2022-09-23 MED ORDER — PROPOFOL 10 MG/ML IV BOLUS
INTRAVENOUS | Status: DC | PRN
  Administered 2022-09-23: 140 mg via INTRAVENOUS

## 2022-09-23 MED ORDER — LIDOCAINE 2% (20 MG/ML) 5 ML SYRINGE
INTRAMUSCULAR | Status: DC | PRN
Start: 2022-09-23 — End: 2022-09-23
  Administered 2022-09-23: 5 mL via INTRAVENOUS

## 2022-09-23 MED ORDER — LOSARTAN POTASSIUM 50 MG PO TABS
100.0000 mg | ORAL_TABLET | Freq: Every day | ORAL | Status: DC
  Administered 2022-09-23: 100 mg via ORAL
  Filled 2022-09-23: qty 2

## 2022-09-23 MED ORDER — BUPIVACAINE-EPINEPHRINE (PF) 0.5% -1:200000 IJ SOLN
INTRAMUSCULAR | Status: DC | PRN
  Administered 2022-09-23: 15 mL via PERINEURAL

## 2022-09-23 MED ORDER — ROCURONIUM BROMIDE 10 MG/ML (PF) SYRINGE
PREFILLED_SYRINGE | INTRAVENOUS | Status: AC
  Filled 2022-09-23: qty 10

## 2022-09-23 MED ORDER — METHOCARBAMOL 1000 MG/10ML IJ SOLN: 500.0000 mg | Freq: Four times a day (QID) | INTRAVENOUS | Status: DC | PRN

## 2022-09-23 MED ORDER — SODIUM CHLORIDE 0.9 % IR SOLN
Status: DC | PRN
  Administered 2022-09-23: 1000 mL
  Administered 2022-09-23: 4800 mL

## 2022-09-23 MED ORDER — PANTOPRAZOLE SODIUM 40 MG PO TBEC
40.0000 mg | DELAYED_RELEASE_TABLET | Freq: Every day | ORAL | Status: DC
  Administered 2022-09-23: 40 mg via ORAL
  Filled 2022-09-23: qty 1

## 2022-09-23 MED ORDER — MIDAZOLAM HCL 2 MG/2ML IJ SOLN
INTRAMUSCULAR | Status: DC | PRN
  Administered 2022-09-23: 2 mg via INTRAVENOUS

## 2022-09-23 MED ORDER — ORAL CARE MOUTH RINSE: 15.0000 mL | Freq: Once | OROMUCOSAL | Status: AC

## 2022-09-23 MED ORDER — CHLORHEXIDINE GLUCONATE 0.12 % MT SOLN: 15.0000 mL | Freq: Once | OROMUCOSAL | Status: AC

## 2022-09-23 MED ORDER — METHOCARBAMOL 500 MG PO TABS
500.0000 mg | ORAL_TABLET | Freq: Four times a day (QID) | ORAL | Status: DC | PRN
  Administered 2022-09-23 – 2022-09-24 (×3): 500 mg via ORAL
  Filled 2022-09-23 (×3): qty 1

## 2022-09-23 MED ORDER — LIDOCAINE 2% (20 MG/ML) 5 ML SYRINGE
INTRAMUSCULAR | Status: AC
  Filled 2022-09-23: qty 5

## 2022-09-23 MED ORDER — CHLORHEXIDINE GLUCONATE 0.12 % MT SOLN
OROMUCOSAL | Status: AC
  Administered 2022-09-23: 15 mL via OROMUCOSAL
  Filled 2022-09-23: qty 15

## 2022-09-23 MED ORDER — PHENOL 1.4 % MT LIQD: 1.0000 | OROMUCOSAL | Status: DC | PRN

## 2022-09-23 MED ORDER — PHENYLEPHRINE HCL-NACL 20-0.9 MG/250ML-% IV SOLN
INTRAVENOUS | Status: DC | PRN
Start: 2022-09-23 — End: 2022-09-23
  Administered 2022-09-23: 25 ug/min via INTRAVENOUS

## 2022-09-23 MED ORDER — ONDANSETRON HCL 4 MG/2ML IJ SOLN
INTRAMUSCULAR | Status: DC | PRN
Start: 2022-09-23 — End: 2022-09-23
  Administered 2022-09-23: 4 mg via INTRAVENOUS

## 2022-09-23 MED ORDER — ONDANSETRON HCL 4 MG PO TABS: 4.0000 mg | ORAL_TABLET | Freq: Four times a day (QID) | ORAL | Status: DC | PRN

## 2022-09-23 MED ORDER — CEFAZOLIN SODIUM-DEXTROSE 2-4 GM/100ML-% IV SOLN
2.0000 g | INTRAVENOUS | Status: AC
  Administered 2022-09-23: 2 g via INTRAVENOUS
  Filled 2022-09-23: qty 100

## 2022-09-23 MED ORDER — ALBUTEROL SULFATE HFA 108 (90 BASE) MCG/ACT IN AERS: 2.0000 | INHALATION_SPRAY | Freq: Four times a day (QID) | RESPIRATORY_TRACT | Status: DC | PRN

## 2022-09-23 MED ORDER — METOCLOPRAMIDE HCL 5 MG/ML IJ SOLN: 5.0000 mg | Freq: Three times a day (TID) | INTRAMUSCULAR | Status: DC | PRN

## 2022-09-23 MED ORDER — EPINEPHRINE PF 1 MG/ML IJ SOLN
INTRAMUSCULAR | Status: DC | PRN
  Administered 2022-09-23: .15 mL via INTRAMUSCULAR

## 2022-09-23 MED ORDER — FLUOXETINE HCL 20 MG PO CAPS
20.0000 mg | ORAL_CAPSULE | Freq: Every day | ORAL | Status: DC
  Administered 2022-09-23: 20 mg via ORAL
  Filled 2022-09-23 (×2): qty 1

## 2022-09-23 MED ORDER — SODIUM CHLORIDE (PF) 0.9 % IJ SOLN
INTRAMUSCULAR | Status: DC | PRN
  Administered 2022-09-23: 15 mL

## 2022-09-23 MED ORDER — LACTATED RINGERS IV SOLN: INTRAVENOUS | Status: DC

## 2022-09-23 MED ORDER — FENTANYL CITRATE (PF) 100 MCG/2ML IJ SOLN: 25.0000 ug | INTRAMUSCULAR | Status: DC | PRN

## 2022-09-23 MED ORDER — BUPIVACAINE LIPOSOME 1.3 % IJ SUSP
INTRAMUSCULAR | Status: DC | PRN
Start: 2022-09-23 — End: 2022-09-23
  Administered 2022-09-23: 10 mL via PERINEURAL

## 2022-09-23 MED ORDER — OXYCODONE HCL 5 MG/5ML PO SOLN: 5.0000 mg | Freq: Once | ORAL | Status: DC | PRN

## 2022-09-23 MED ORDER — AMLODIPINE BESYLATE 10 MG PO TABS
10.0000 mg | ORAL_TABLET | Freq: Every day | ORAL | Status: DC
  Administered 2022-09-23: 10 mg via ORAL
  Filled 2022-09-23: qty 1

## 2022-09-23 MED ORDER — FENTANYL CITRATE (PF) 250 MCG/5ML IJ SOLN
INTRAMUSCULAR | Status: DC | PRN
  Administered 2022-09-23: 50 ug via INTRAVENOUS

## 2022-09-23 MED ORDER — ACETAMINOPHEN 325 MG PO TABS: 325.0000 mg | ORAL_TABLET | Freq: Four times a day (QID) | ORAL | Status: DC | PRN

## 2022-09-23 MED ORDER — PHENYLEPHRINE HCL (PRESSORS) 10 MG/ML IV SOLN
INTRAVENOUS | Status: DC | PRN
Start: 2022-09-23 — End: 2022-09-23
  Administered 2022-09-23 (×2): 100 ug via INTRAVENOUS

## 2022-09-23 MED ORDER — SODIUM CHLORIDE 0.9 % IV SOLN: INTRAVENOUS | Status: DC

## 2022-09-23 MED ORDER — ACETAMINOPHEN 500 MG PO TABS
1000.0000 mg | ORAL_TABLET | Freq: Four times a day (QID) | ORAL | Status: AC
  Administered 2022-09-23 – 2022-09-24 (×4): 1000 mg via ORAL
  Filled 2022-09-23 (×4): qty 2

## 2022-09-23 MED ORDER — METOCLOPRAMIDE HCL 5 MG PO TABS: 5.0000 mg | ORAL_TABLET | Freq: Three times a day (TID) | ORAL | Status: DC | PRN

## 2022-09-23 MED ORDER — MONTELUKAST SODIUM 10 MG PO TABS
10.0000 mg | ORAL_TABLET | Freq: Every day | ORAL | Status: DC
  Administered 2022-09-23: 10 mg via ORAL
  Filled 2022-09-23: qty 1

## 2022-09-23 MED ORDER — PHENYLEPHRINE 80 MCG/ML (10ML) SYRINGE FOR IV PUSH (FOR BLOOD PRESSURE SUPPORT)
PREFILLED_SYRINGE | INTRAVENOUS | Status: AC
  Filled 2022-09-23: qty 10

## 2022-09-23 MED ORDER — SUGAMMADEX SODIUM 200 MG/2ML IV SOLN
INTRAVENOUS | Status: DC | PRN
Start: 2022-09-23 — End: 2022-09-23
  Administered 2022-09-23: 200 mg via INTRAVENOUS

## 2022-09-23 MED ORDER — PROPOFOL 10 MG/ML IV BOLUS
INTRAVENOUS | Status: AC
  Filled 2022-09-23: qty 20

## 2022-09-23 MED ORDER — TOPIRAMATE 100 MG PO TABS: 200.0000 mg | ORAL_TABLET | Freq: Two times a day (BID) | ORAL | Status: DC | PRN

## 2022-09-23 MED ORDER — ONDANSETRON HCL 4 MG/2ML IJ SOLN
INTRAMUSCULAR | Status: AC
  Filled 2022-09-23: qty 2

## 2022-09-23 MED ORDER — DEXAMETHASONE SODIUM PHOSPHATE 10 MG/ML IJ SOLN
INTRAMUSCULAR | Status: AC
  Filled 2022-09-23: qty 1

## 2022-09-23 MED ORDER — TRAZODONE HCL 50 MG PO TABS
50.0000 mg | ORAL_TABLET | Freq: Every evening | ORAL | Status: DC | PRN
  Administered 2022-09-23: 50 mg via ORAL
  Filled 2022-09-23: qty 1

## 2022-09-23 MED ORDER — VANCOMYCIN HCL IN DEXTROSE 1-5 GM/200ML-% IV SOLN
1000.0000 mg | Freq: Once | INTRAVENOUS | Status: AC
  Administered 2022-09-23: 1000 mg via INTRAVENOUS
  Filled 2022-09-23: qty 200

## 2022-09-23 SURGICAL SUPPLY — 89 items
AID PSTN UNV HD RSTRNT DISP (MISCELLANEOUS) ×1
ANCH SUT 2 FBRTK KNTLS 1.8 (Anchor) ×1 IMPLANT
ANCH SUT 2 SWLK 19.1 CLS EYLT (Anchor) ×3 IMPLANT
ANCH SUT FBRTK 1.3 2 TPE (Anchor) ×2 IMPLANT
ANCH SUT PUSHLCK 24X4.5 STRL (Orthopedic Implant) IMPLANT
ANCHOR FBRTK 2.6 SUTURETAP 1.3 (Anchor) IMPLANT
ANCHOR SUT 1.8 FIBERTAK SB KL (Anchor) IMPLANT
ANCHOR SWIVELOCK BIO 4.75X19.1 (Anchor) IMPLANT
BAG COUNTER SPONGE SURGICOUNT (BAG) ×1 IMPLANT
BAG SPNG CNTER NS LX DISP (BAG) ×2
BLADE EXCALIBUR 4.0X13 (MISCELLANEOUS) ×1 IMPLANT
BLADE SURG 11 STRL SS (BLADE) ×1 IMPLANT
BLADE SURG 15 STRL LF DISP TIS (BLADE) IMPLANT
BLADE SURG 15 STRL SS (BLADE) ×1
BURR OVAL 8 FLU 4.0X13 (MISCELLANEOUS) IMPLANT
COOLER ICEMAN CLASSIC (MISCELLANEOUS) IMPLANT
COVER SURGICAL LIGHT HANDLE (MISCELLANEOUS) ×1 IMPLANT
DRAPE INCISE IOBAN 66X45 STRL (DRAPES) ×2 IMPLANT
DRAPE STERI 35X30 U-POUCH (DRAPES) ×1 IMPLANT
DRAPE U-SHAPE 47X51 STRL (DRAPES) ×2 IMPLANT
DRSG TEGADERM 4X4.75 (GAUZE/BANDAGES/DRESSINGS) ×4 IMPLANT
DRSG XEROFORM 1X8 (GAUZE/BANDAGES/DRESSINGS) IMPLANT
DURAPREP 26ML APPLICATOR (WOUND CARE) ×1 IMPLANT
DW OUTFLOW CASSETTE/TUBE SET (MISCELLANEOUS) IMPLANT
ELECT REM PT RETURN 9FT ADLT (ELECTROSURGICAL) ×1
ELECTRODE REM PT RTRN 9FT ADLT (ELECTROSURGICAL) ×1 IMPLANT
FILTER STRAW FLUID ASPIR (MISCELLANEOUS) ×1 IMPLANT
GAUZE PAD ABD 8X10 STRL (GAUZE/BANDAGES/DRESSINGS) ×3 IMPLANT
GAUZE SPONGE 4X4 12PLY STRL (GAUZE/BANDAGES/DRESSINGS) IMPLANT
GAUZE SPONGE 4X4 12PLY STRL LF (GAUZE/BANDAGES/DRESSINGS) ×1 IMPLANT
GAUZE XEROFORM 1X8 LF (GAUZE/BANDAGES/DRESSINGS) ×1 IMPLANT
GLOVE BIO SURGEON STRL SZ7.5 (GLOVE) IMPLANT
GLOVE BIOGEL PI IND STRL 8 (GLOVE) ×1 IMPLANT
GLOVE BIOGEL PI MICRO STRL 6 (GLOVE) IMPLANT
GLOVE ECLIPSE 8.0 STRL XLNG CF (GLOVE) ×1 IMPLANT
GLOVE SS BIOGEL STRL SZ 6 (GLOVE) IMPLANT
GOWN STRL REUS W/ TWL LRG LVL3 (GOWN DISPOSABLE) ×3 IMPLANT
GOWN STRL REUS W/TWL LRG LVL3 (GOWN DISPOSABLE) ×3
IV NS IRRIG 3000ML ARTHROMATIC (IV SOLUTION) IMPLANT
KIT BASIN OR (CUSTOM PROCEDURE TRAY) ×1 IMPLANT
KIT STR SPEAR 1.8 FBRTK DISP (KITS) IMPLANT
KIT TURNOVER KIT B (KITS) ×1 IMPLANT
MANIFOLD NEPTUNE II (INSTRUMENTS) ×1 IMPLANT
NDL HD SCORPION MEGA LOADER (NEEDLE) IMPLANT
NDL HYPO 25GX1X1/2 BEV (NEEDLE) IMPLANT
NDL HYPO 25X1 1.5 SAFETY (NEEDLE) ×1 IMPLANT
NDL SCORPION MULTI FIRE (NEEDLE) IMPLANT
NDL SPNL 18GX3.5 QUINCKE PK (NEEDLE) ×1 IMPLANT
NDL SUT 6 .5 CRC .975X.05 MAYO (NEEDLE) IMPLANT
NEEDLE HYPO 25GX1X1/2 BEV (NEEDLE) ×2 IMPLANT
NEEDLE HYPO 25X1 1.5 SAFETY (NEEDLE) ×1 IMPLANT
NEEDLE MAYO TAPER (NEEDLE) ×1
NEEDLE SCORPION MULTI FIRE (NEEDLE) ×1 IMPLANT
NEEDLE SPNL 18GX3.5 QUINCKE PK (NEEDLE) ×1 IMPLANT
NS IRRIG 1000ML POUR BTL (IV SOLUTION) ×1 IMPLANT
PACK SHOULDER (CUSTOM PROCEDURE TRAY) ×1 IMPLANT
PAD ARMBOARD 7.5X6 YLW CONV (MISCELLANEOUS) ×2 IMPLANT
PAD COLD SHLDR WRAP-ON (PAD) IMPLANT
PROBE APOLLO 90XL (SURGICAL WAND) ×1 IMPLANT
RESTRAINT HEAD UNIVERSAL NS (MISCELLANEOUS) ×1 IMPLANT
SLING ARM IMMOBILIZER LRG (SOFTGOODS) IMPLANT
SLING ARM IMMOBILIZER MED (SOFTGOODS) IMPLANT
SPONGE T-LAP 4X18 ~~LOC~~+RFID (SPONGE) ×2 IMPLANT
STRIP CLOSURE SKIN 1/2X4 (GAUZE/BANDAGES/DRESSINGS) ×1 IMPLANT
SUCTION TUBE FRAZIER 10FR DISP (SUCTIONS) ×1 IMPLANT
SUT ETHILON 3 0 PS 1 (SUTURE) ×1 IMPLANT
SUT FIBERWIRE #2 38 T-5 BLUE (SUTURE) ×1
SUT MNCRL AB 3-0 PS2 18 (SUTURE) ×1 IMPLANT
SUT VIC AB 0 CT1 27 (SUTURE) ×1
SUT VIC AB 0 CT1 27XBRD ANBCTR (SUTURE) ×1 IMPLANT
SUT VIC AB 0 CT1 36 (SUTURE) IMPLANT
SUT VIC AB 1 CT1 27 (SUTURE) ×3
SUT VIC AB 1 CT1 27XBRD ANBCTR (SUTURE) ×1 IMPLANT
SUT VIC AB 1 CT1 27XBRD ANTBC (SUTURE) IMPLANT
SUT VIC AB 2-0 CT1 27 (SUTURE) ×1
SUT VIC AB 2-0 CT1 TAPERPNT 27 (SUTURE) ×1 IMPLANT
SUT VIC AB 2-0 CT2 27 (SUTURE) IMPLANT
SUT VICRYL 0 UR6 27IN ABS (SUTURE) IMPLANT
SUTURE FIBERWR #2 38 T-5 BLUE (SUTURE) IMPLANT
SYR 20ML LL LF (SYRINGE) ×2 IMPLANT
SYR 3ML LL SCALE MARK (SYRINGE) ×1 IMPLANT
SYR TB 1ML LUER SLIP (SYRINGE) ×1 IMPLANT
SYS FBRTK BUTTON 2.6 (Anchor) ×1 IMPLANT
SYSTEM FBRTK BUTTON 2.6 (Anchor) IMPLANT
TOWEL GREEN STERILE (TOWEL DISPOSABLE) ×1 IMPLANT
TOWEL GREEN STERILE FF (TOWEL DISPOSABLE) ×1 IMPLANT
TUBING ARTHROSCOPY IRRIG 16FT (MISCELLANEOUS) ×1 IMPLANT
WAND ABLATOR APOLLO I90 (BUR) IMPLANT
WATER STERILE IRR 1000ML POUR (IV SOLUTION) ×1 IMPLANT

## 2022-09-23 NOTE — Transfer of Care (Signed)
Immediate Anesthesia Transfer of Care Note  [name] January  Procedure(s) Performed: LEFT SHOULDER ARTHROSCOPY, DEBRIDEMENT, MINI OPEN BICEPS TENODESIS AND ROTATOR CUFF TEAR REPAIR, DISTAL CLAVICLE EXCISION (Left: Shoulder)  Patient Location: PACU  Anesthesia Type:General, (regional block)  Level of Consciousness: awake, alert , oriented, patient cooperative, and responds to stimulation  Airway & Oxygen Therapy: Patient Spontanous Breathing and Patient connected to face mask oxygen  Post-op Assessment: Report given to RN, Post -op Vital signs reviewed and stable, and Patient able to stick tongue midline  Post vital signs: Reviewed and stable  Last Vitals:  Vitals Value Taken Time  BP 135/88 09/23/22 1101  Temp 36.4 C 09/23/22 1101  Pulse 62 09/23/22 1107  Resp 16 09/23/22 1107  SpO2 99 % 09/23/22 1107  Vitals shown include unfiled device data.  Last Pain:  Vitals:   09/23/22 1101  TempSrc: Temporal  PainSc: 0-No pain     Report given to PACU RN for continued care and follow-up.    Complications: No notable events documented.

## 2022-09-23 NOTE — Op Note (Unsigned)
NAMEJOSEJAVIER, PROPHETE MEDICAL RECORD NO: [id number] ACCOUNT NO: [id number] DATE OF BIRTH: 1960/04/09 FACILITY: [facility] LOCATION: [facility] PHYSICIAN: Graylin Shiver. , MD  Operative Report   DATE OF PROCEDURE: 09/23/2022  PREOPERATIVE DIAGNOSIS:  Left shoulder AC joint arthritis, rotator cuff tear and biceps tendinitis with SLAP tear.  POSTOPERATIVE DIAGNOSIS:  Left shoulder AC joint arthritis, rotator cuff tear and biceps tendinitis with SLAP tear.  PROCEDURE:  Left shoulder arthroscopy with debridement of the superior labrum, release of the biceps tendon, arthroscopic distal clavicle excision with mini open biceps tenodesis and rotator cuff tear repair using 2 x 3 construct.  SURGEON:  Graylin Shiver. August Saucer, MD  ASSISTANT:  Karenann Cai.  INDICATIONS:  This is a 62 year old patient with significant left shoulder pain who presents for operative management after explanation of risks and benefits.  DESCRIPTION OF PROCEDURE:  The patient was brought to the operating room where general anesthetic was induced.  Preoperative antibiotics administered.  Timeout was called.  The patient was placed in the beach chair position with head in neutral position.   Left arm, shoulder and hand prescrubbed with alcohol and Betadine, allowed to air dry.  Prepped with DuraPrep solution and draped in sterile manner.  Ioban used to seal the operative field and cover the axilla.  Examination under anesthesia  demonstrated external rotation of 60, isolated glenohumeral abduction of 100 and forward flexion of 170.  Shoulder stability was excellent.  After calling time-out, an injection of saline with epinephrine injection into the subacromial space in the  region of the distal clavicle.  Next posterior portal was created 2 cm medial and inferior to the posterolateral margin of the acromion. Anterior portal created inline with the expected and required location of the portal for distal clavicle excision.   At this time,  intraarticular examination was performed.  The patient did have degeneration of the biceps tendon with a SLAP tear extending from the 10 o'clock to 1 o'clock position.  The glenohumeral articular surfaces were intact.  The rotator cuff tear  was visualized, which was primarily degenerative in nature.  Anterior inferior and posterior inferior glenohumeral ligaments intact.  Biceps tendon was released.  Superior labrum debrided.  This included the anterior portion of the labrum, which was  also detached.  Instruments were removed at this time and the instruments then placed into the subacromial space.  Lateral portal was created.  Bursectomy performed.  Arthroscopic distal clavicle excision was then performed of the extremely arthritic  distal clavicle.  About 9 mm was resected with maintenance of the posterior and superior ligaments.  At this time, a thorough irrigation was performed and those instruments were removed.  Portals were closed using 2-0 Vicryl, 3-0 nylon.  Ioban used to  cover the entire operative field.  Incision made off the anterolateral margin of the acromion Skin and subcutaneous tissue were sharply divided.  Deltoid was split in the raphae between the anterior middle deltoid was marked with #1 Vicryl suture at 4 cm  from the anterolateral margin of the acromion.  Bursectomy completed.  Biceps tendon then tenodesed into the bicipital groove using Arthrex knotless suture anchors distally and proximally with oversewing of the biceps tendon using 0 Vicryl suture, we  did place an Arthrex FiberLoop suture through the tendon to facilitate fixation proximally into this knotless suture anchor.  At this time, the rotator cuff tear was identified.  Primarily degenerative rotator cuff tissue was debrided and the footprint  was prepared.  The size of the  tear was approximately 2 x 2.5 cm.  7-0 Vicryl sutures were placed posterior to anterior in the leading edge of the tendon in order to facilitate  mobilization and repair.  Next, we placed 2 knotless suture tape anchors at  the junction of the articular surface and the tuberosity.  The suture tapes were then passed x8 posterior to anterior.  At this time, the rotator cuff tendon was reduced with the arm adducted using the Vicryl sutures.  They were placed into a SwiveLock  with good reduction and essentially watertight closure achieved.  Next, the suture tapes were tied and the 8 limbs were then split up into 4 x 4 and those were crossed and placed down over the rotator cuff repair and placed in the SwiveLocks as well.   Overall, a watertight repair was achieved.  Acromioplasty performed.  Thorough irrigation was then performed.  Checked the distal clavicle excision and was found to be symmetric and intact.  Thorough irrigation was then performed with an Arthrex  irrigating tubing and then the deltoid split was closed using #1 Vicryl suture followed by interrupted inverted 0 Vicryl suture, 2-0 Vicryl suture, and 3-0 Monocryl with Steri-Strips and Impervious dressings applied.  Shoulder sling applied.  The patient  tolerated the procedure well without immediate complications, transferred to the recovery room in stable condition.  Luke's assistance was required at all times for opening, closing, retraction, mobilization of tissue.  His assistance was a medical  necessity.   PUS D: 09/23/2022 11:14:32 am T: 09/23/2022 11:38:00 am  JOB: [id number]/ [id number]
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
## 2022-09-23 NOTE — Evaluation (Signed)
Occupational Therapy Evaluation Patient Details Name: Gregory [name] MRN: [id number] DOB: 12-12-1960 Today's Date: 09/23/2022   History of Present Illness 62 yo male s/p 8/6 L shoulder arthroscopy debridement mini open biceps tenodesis and rotator cuff tear repair distal clavicle excision PMH OSA on Cpap, PTSD TBI ETOH abuse cervical surg, memory loss gout bipolar   Clinical Impression   Patient is s/p L rotator cuff repair surgery resulting in functional limitations due to the deficits listed below (see OT problem list). Pt at baseline indep with all adls and iadls. Pt currently with L UE in sling and dominant side. Pt progressed with bed mobility, ice man positioning and transfer. Wife not present at this time to cover education so handouts presented at bedside.  Patient will benefit from skilled OT acutely to increase independence and safety with ADLS to allow discharge home with MD follow up.       Recommendations for follow up therapy are one component of a multi-disciplinary discharge planning process, led by the attending physician.  Recommendations may be updated based on patient status, additional functional criteria and insurance authorization.   Assistance Recommended at Discharge Intermittent Supervision/Assistance  Patient can return home with the following A little help with bathing/dressing/bathroom    Functional Status Assessment  Patient has had a recent decline in their functional status and demonstrates the ability to make significant improvements in function in a reasonable and predictable amount of time.  Equipment Recommendations  None recommended by OT    Recommendations for Other Services       Precautions / Restrictions Precautions Precautions: Shoulder Shoulder Interventions: At all times;Shoulder sling/immobilizer;Off for dressing/bathing/exercises Restrictions Weight Bearing Restrictions: Yes LUE Weight Bearing: Non weight bearing      Mobility Bed  Mobility Overal bed mobility: Needs Assistance Bed Mobility: Supine to Sit, Sit to Supine     Supine to sit: Min guard Sit to supine: Min guard   General bed mobility comments: cues for L UE and pt long sitting to exit the bed without deficits    Transfers Overall transfer level: Needs assistance Equipment used: 1 person hand held assist Transfers: Sit to/from Stand Sit to Stand: Min assist                  Balance Overall balance assessment: Mild deficits observed, not formally tested                                         ADL either performed or assessed with clinical judgement   ADL Overall ADL's : Needs assistance/impaired Eating/Feeding: Set up   Grooming: Minimal assistance   Upper Body Bathing: With adaptive equipment   Lower Body Bathing: Moderate assistance   Upper Body Dressing : Minimal assistance   Lower Body Dressing: Moderate assistance   Toilet Transfer: Minimal assistance           Functional mobility during ADLs: Min guard General ADL Comments: pt educated on bed mobility and avoiding weigh on L UE.     Vision Baseline Vision/History: 1 Wears glasses Ability to See in Adequate Light: 0 Adequate Patient Visual Report: No change from baseline Vision Assessment?: No apparent visual deficits     Perception     Praxis      Pertinent Vitals/Pain Pain Assessment Pain Assessment: No/denies pain     Hand Dominance Left   Extremity/Trunk Assessment Upper Extremity Assessment Upper  Extremity Assessment: LUE deficits/detail LUE Deficits / Details: block still in place and noted to be able to move digits only at this time.   Lower Extremity Assessment Lower Extremity Assessment: LLE deficits/detail LLE Deficits / Details: leg length with lift in shoe. pt to ask wife to bring the shoes for tomorrow   Cervical / Trunk Assessment Cervical / Trunk Assessment: Normal   Communication Communication Communication: No  difficulties   Cognition Arousal/Alertness: Awake/alert Behavior During Therapy: WFL for tasks assessed/performed Overall Cognitive Status: History of cognitive impairments - at baseline                                 General Comments: pt oriented x4 and correctly reports house setup compared to chart     General Comments  ice applied to L shoulder with ice man at this time    Exercises Exercises: Other exercises Other Exercises Other Exercises: hand open and close into fist as much as possible due to block in place. OT to further educate on pendulums next session. Other Exercises: handout with pendulums, ice man, post surgical shoulder instructions, sling wear all left at bedside with introduction to the next sessions goals   Shoulder Instructions      Home Living Family/patient expects to be discharged to:: Private residence Living Arrangements: Spouse/significant other Available Help at Discharge: Family Type of Home: House Home Access: Stairs to enter Secretary/administrator of Steps: 4-5 Entrance Stairs-Rails: Right;Left Home Layout: One level     Bathroom Shower/Tub: Chief Strategy Officer: Standard Bathroom Accessibility: Yes How Accessible: Accessible via walker Home Equipment: Rolling Walker (2 wheels)   Additional Comments: lift for shoe L LE, has 10 dogs at the home      Prior Functioning/Environment Prior Level of Function : Independent/Modified Independent;Driving;Working/employed               ADLs Comments: spouse will assist with L sock/shoe due to chronic L leg dysfunction        OT Problem List: Decreased range of motion;Impaired UE functional use      OT Treatment/Interventions: Self-care/ADL training;Therapeutic exercise;Neuromuscular education;Energy conservation;DME and/or AE instruction;Manual therapy;Modalities;Therapeutic activities;Patient/family education;Balance training    OT Goals(Current goals can be  found in the care plan section) Acute Rehab OT Goals Patient Stated Goal: to go home tomorrow OT Goal Formulation: With patient Time For Goal Achievement: 10/07/22 Potential to Achieve Goals: Good  OT Frequency: Min 1X/week    Co-evaluation              AM-PAC OT "6 Clicks" Daily Activity     Outcome Measure Help from another person eating meals?: A Little Help from another person taking care of personal grooming?: A Lot Help from another person toileting, which includes using toliet, bedpan, or urinal?: A Lot Help from another person bathing (including washing, rinsing, drying)?: A Lot Help from another person to put on and taking off regular upper body clothing?: A Lot Help from another person to put on and taking off regular lower body clothing?: A Lot 6 Click Score: 13   End of Session Nurse Communication: Mobility status;Precautions;Weight bearing status  Activity Tolerance: Patient tolerated treatment well Patient left: in bed;with call bell/phone within reach;with SCD's reapplied  OT Visit Diagnosis: Unsteadiness on feet (R26.81);Muscle weakness (generalized) (M62.81)                Time: 2956-2130 OT Time Calculation (min):  20 min Charges:  OT General Charges $OT Visit: 1 Visit OT Evaluation $OT Eval Moderate Complexity: 1 Mod   Brynn, OTR/L  Acute Rehabilitation Services Office: [phone] .   Mateo Flow 09/23/2022, 2:20 PM

## 2022-09-23 NOTE — Anesthesia Procedure Notes (Addendum)
Anesthesia Regional Block: Interscalene brachial plexus block   Pre-Anesthetic Checklist: , timeout performed,  Correct Patient, Correct Site, Correct Laterality,  Correct Procedure, Correct Position, site marked,  Risks and benefits discussed,  Pre-op evaluation,  At surgeon's request and post-op pain management  Laterality: Left  Prep: Maximum Sterile Barrier Precautions used, chloraprep       Needles:  Injection technique: Single-shot  Needle Type: Echogenic Stimulator Needle     Needle Length: 4cm  Needle Gauge: 22     Additional Needles:   Procedures:,,,, ultrasound used (permanent image in chart),,    Narrative:  Start time: 09/23/2022 7:15 AM End time: 09/23/2022 7:18 AM Injection made incrementally with aspirations every 5 mL.  Performed by: Personally  Anesthesiologist: Kaylyn Layer, MD  Additional Notes: Risks, benefits, and alternative discussed. Patient gave consent for procedure. Patient prepped and draped in sterile fashion. Sedation administered, patient remains easily responsive to voice. Relevant anatomy identified with ultrasound guidance. Local anesthetic given in 5cc increments with no signs or symptoms of intravascular injection. No pain or paraesthesias with injection. Patient monitored throughout procedure with signs of LAST or immediate complications. Tolerated well. Ultrasound image placed in chart.  Amalia Greenhouse, MD

## 2022-09-23 NOTE — Brief Op Note (Signed)
   09/23/2022  11:07 AM  PATIENT:  Gregory [name]  62 y.o. male  PRE-OPERATIVE DIAGNOSIS:  left shoulder rotator cuff tear, biceps tendinitis, acromioclavicular osteoarthritis  POST-OPERATIVE DIAGNOSIS:  left shoulder rotator cuff tear, biceps tendinitis, acromioclavicular osteoarthritis  PROCEDURE:  Procedure(s): LEFT SHOULDER ARTHROSCOPY, DEBRIDEMENT, MINI OPEN BICEPS TENODESIS AND ROTATOR CUFF TEAR REPAIR, DISTAL CLAVICLE EXCISION  SURGEON:  Surgeon(s): August Saucer, Corrie Mckusick, MD  ASSISTANT: magnant pa  ANESTHESIA:   general  EBL: 5 ml    Total I/O In: 800 [I.V.:600; IV Piggyback:200] Out: -   BLOOD ADMINISTERED: none  DRAINS: none   LOCAL MEDICATIONS USED:  none  SPECIMEN:  No Specimen  COUNTS:  YES  TOURNIQUET:  * No tourniquets in log *  DICTATION: .Other Dictation: Dictation Number [id number]  PLAN OF CARE: Admit for overnight observation  PATIENT DISPOSITION:  PACU - hemodynamically stable

## 2022-09-23 NOTE — Progress Notes (Signed)
  Subjective: Pt stable -  block working   Objective: Vital signs in last 24 hours: Temp:  [97.4 F (36.3 C)-98.6 F (37 C)] 98.2 F (36.8 C) (08/06 1910) Pulse Rate:  [60-79] 79 (08/06 1910) Resp:  [14-18] 18 (08/06 1910) BP: (112-135)/(79-88) 112/79 (08/06 1910) SpO2:  [95 %-100 %] 95 % (08/06 1910) Weight:  [74.4 kg] 74.4 kg (08/06 0607)  Intake/Output from previous day: No intake/output data recorded. Intake/Output this shift: No intake/output data recorded.  Exam:  No cellulitis present Compartment soft  Labs: No results for input(s): "HGB" in the last 72 hours. No results for input(s): "WBC", "RBC", "HCT", "PLT" in the last 72 hours. No results for input(s): "NA", "K", "CL", "CO2", "BUN", "CREATININE", "GLUCOSE", "CALCIUM" in the last 72 hours. No results for input(s): "LABPT", "INR" in the last 72 hours.  Assessment/Plan: Plan dc am - extensive discussion with patient and wife about post op plans    G Dorene Grebe 09/23/2022, 8:29 PM

## 2022-09-23 NOTE — Anesthesia Procedure Notes (Signed)
Procedure Name: Intubation Date/Time: 09/23/2022 7:53 AM  Performed by: Earlene Plater, CRNAPre-anesthesia Checklist: Patient identified Patient Re-evaluated:Patient Re-evaluated prior to induction Oxygen Delivery Method: Circle system utilized Preoxygenation: Pre-oxygenation with 100% oxygen Induction Type: IV induction Ventilation: Oral airway inserted - appropriate to patient size and Mask ventilation without difficulty Laryngoscope Size: Mac and 3 Grade View: Grade I Tube type: Oral Tube size: 7.0 mm Number of attempts: 1 Airway Equipment and Method: Stylet Placement Confirmation: ETT inserted through vocal cords under direct vision, positive ETCO2, CO2 detector and breath sounds checked- equal and bilateral Secured at: 22 cm Tube secured with: Tape Dental Injury: Teeth and Oropharynx as per pre-operative assessment

## 2022-09-23 NOTE — Anesthesia Postprocedure Evaluation (Signed)
Anesthesia Post Note  [name]  Procedure(s) Performed: LEFT SHOULDER ARTHROSCOPY, DEBRIDEMENT, MINI OPEN BICEPS TENODESIS AND ROTATOR CUFF TEAR REPAIR, DISTAL CLAVICLE EXCISION (Left: Shoulder)     Patient location during evaluation: PACU Anesthesia Type: General Level of consciousness: awake and alert Pain management: pain level controlled Vital Signs Assessment: post-procedure vital signs reviewed and stable Respiratory status: spontaneous breathing, nonlabored ventilation and respiratory function stable Cardiovascular status: blood pressure returned to baseline Postop Assessment: no apparent nausea or vomiting Anesthetic complications: no   No notable events documented.  Last Vitals:  Vitals:   09/23/22 1130 09/23/22 1145  BP: 116/85 127/84  Pulse: 67 67  Resp: 14 18  Temp: 36.5 C 36.8 C  SpO2: 95% 100%    Last Pain:  Vitals:   09/23/22 1130  TempSrc:   PainSc: 0-No pain                 Shanda Howells

## 2022-09-24 ENCOUNTER — Encounter (HOSPITAL_COMMUNITY): Payer: Self-pay | Admitting: Orthopedic Surgery

## 2022-09-24 DIAGNOSIS — M19012 Primary osteoarthritis, left shoulder: Secondary | ICD-10-CM | POA: Diagnosis not present

## 2022-09-24 MED ORDER — OXYCODONE HCL 5 MG PO TABS: 5.0000 mg | ORAL_TABLET | ORAL | 0 refills | Status: DC | PRN

## 2022-09-24 MED ORDER — CELECOXIB 100 MG PO CAPS: 100.0000 mg | ORAL_CAPSULE | Freq: Two times a day (BID) | ORAL | 0 refills | Status: DC

## 2022-09-24 MED ORDER — ASPIRIN 81 MG PO TBEC: 81.0000 mg | DELAYED_RELEASE_TABLET | Freq: Every day | ORAL | 0 refills | Status: DC

## 2022-09-24 MED ORDER — METHOCARBAMOL 500 MG PO TABS: 500.0000 mg | ORAL_TABLET | Freq: Three times a day (TID) | ORAL | 0 refills | Status: DC | PRN

## 2022-09-24 NOTE — Progress Notes (Signed)
Patient ID: Gregory [name], male   DOB: Jun 05, 1960, 62 y.o.   MRN: [id number] An After Visit Summary was printed and given to the patient.  Patient education given on medication changes and follow up appointments and the patient expresses understanding and acceptance of instructions.   Lidia Collum 09/24/2022 10:49 AM

## 2022-09-24 NOTE — Progress Notes (Signed)
Patient is POD 1 s/p left shoulder rotator cuff repair, bicep tenodesis, distal clavicle excision.  Doing well.  Block still in effect.  Pain is controlled.  Has been doing laps around the unit.  No chest pain or shortness of breath.  On exam, patient has 2+ radial pulse of the operative extremity.  Dressings are intact with small amount of dried blood.  Intact EPL, FPL, finger abduction.  Able to actively flex at the elbow.  Plan is to discharge home today.  All questions answered.  Call the office with any concerns.

## 2022-09-24 NOTE — Progress Notes (Signed)
PT Cancellation Note  Patient Details Name: Gregory [name] MRN: [id number] DOB: 1960-08-27   Cancelled Treatment:    Reason Eval/Treat Not Completed: PT screened, no needs identified, will sign off. Per discussion with OT and the pt, pt has been ambulating in the hallway without issue. Pt reports ambulating 20 laps this morning. Pt denies concerns about functional mobility at this time. Pt does not have acute PT needs at this time. PT signing off.   Arlyss Gandy 09/24/2022, 9:25 AM

## 2022-09-24 NOTE — Discharge Summary (Signed)
Physician Discharge Summary      Patient ID: Gregory Gregory [name] MRN: [id number] DOB/AGE: 19-Feb-1960 62 y.o.  Admit date: 09/23/2022 Discharge date: 09/24/22  Admission Diagnoses:  Principal Problem:   S/P rotator cuff repair   Discharge Diagnoses:  Same  Surgeries: Procedure(s): LEFT SHOULDER ARTHROSCOPY, DEBRIDEMENT, MINI OPEN BICEPS TENODESIS AND ROTATOR CUFF TEAR REPAIR, DISTAL CLAVICLE EXCISION on 09/23/2022   Consultants:   Discharged Condition: Stable  Hospital Course: Gregory Gregory [name] is an 62 y.o. male who was admitted 09/23/2022 with a chief complaint of left shoulder pain, and found to have a diagnosis of left shoulder rotator cuff tear, bicep tendinitis, AC joint arthritis.  They were brought to the operating room on 09/23/2022 and underwent the above named procedures.  Pt awoke from anesthesia without complication and was transferred to the floor. On POD1, patient's pain was controlled.  Block in effect but wearing off.  No red flag symptoms.  He was able to ambulate without difficulty or lightheadedness/dizziness.  Discharged home on POD 1..  Pt will f/u with Dr. August Saucer in clinic in ~1-2 weeks.   Antibiotics given:  Anti-infectives (From admission, onward)    Start     Dose/Rate Route Frequency Ordered Stop   09/23/22 1600  ceFAZolin (ANCEF) IVPB 2g/100 mL premix        2 g 200 mL/hr over 30 Minutes Intravenous Every 8 hours 09/23/22 1156 09/24/22 0615   09/23/22 1115  vancomycin (VANCOCIN) IVPB 1000 mg/200 mL premix        1,000 mg 200 mL/hr over 60 Minutes Intravenous  Once 09/23/22 1108 09/23/22 1420   09/23/22 0615  ceFAZolin (ANCEF) IVPB 2g/100 mL premix        2 g 200 mL/hr over 30 Minutes Intravenous On call to O.R. 09/23/22 2595 09/23/22 0813     .  Recent vital signs:  Vitals:   09/24/22 0327 09/24/22 0824  BP: 125/82 107/65  Pulse: 65 76  Resp: 18 18  Temp: 97.8 F (36.6 C) 98.2 F (36.8 C)  SpO2: 98% 97%    Recent laboratory studies:  Results for orders  placed or performed during the hospital encounter of 09/23/22  Surgical pcr screen   Specimen: Nasal Mucosa; Nasal Swab  Result Value Ref Range   MRSA, PCR NEGATIVE NEGATIVE   Staphylococcus aureus NEGATIVE NEGATIVE  No blood products  Result Value Ref Range   Transfuse no blood products      TRANSFUSE NO BLOOD PRODUCTS, VERIFIED BY Fredirick Lathe, RN 08.06.24 [phone] Performed at [facility], [address] [place], [place] [address]     Discharge Medications:   Allergies as of 09/24/2022       Reactions   Peanut-containing Drug Products Anaphylaxis   Strawberry Extract Hives   Patietn states he breaks out in hives when eating strawberries   Lisinopril Cough   Significant dry cough   Other    BLOOD PRODUCT REFUSAL   Hydromorphone Hcl Itching, Rash   Meperidine Hcl Itching, Rash   delusional   Morphine Itching, Rash        Medication List     STOP taking these medications    cyclobenzaprine 5 MG tablet Commonly known as: FLEXERIL   traMADol 50 MG tablet Commonly known as: Ultram       TAKE these medications    albuterol 108 (90 Base) MCG/ACT inhaler Commonly known as: VENTOLIN HFA Inhale 2 puffs into the lungs every 6 (six) hours as needed for wheezing  or shortness of breath.   albuterol 108 (90 Base) MCG/ACT inhaler Commonly known as: VENTOLIN HFA Inhale 2 puffs into the lungs every 6 (six) hours as needed for wheezing or shortness of breath.   amLODipine 10 MG tablet Commonly known as: NORVASC Take 10 mg by mouth at bedtime.   aspirin EC 81 MG tablet Take 1 tablet (81 mg total) by mouth daily. Swallow whole.   atorvastatin 80 MG tablet Commonly known as: LIPITOR Take 80 mg by mouth at bedtime.   azelastine 0.05 % ophthalmic solution Commonly known as: OPTIVAR Place 1 drop into Gregory [name] eyes 2 (two) times daily.   Breo Ellipta 200-25 MCG/ACT Aepb Generic drug: fluticasone furoate-vilanterol Inhale 1 puff into the lungs daily. What changed:   when to take this reasons to take this   celecoxib 100 MG capsule Commonly known as: CELEBREX Take 1 capsule (100 mg total) by mouth 2 (two) times daily.   cetirizine 10 MG tablet Commonly known as: ZYRTEC Take 1 tablet (10 mg total) by mouth daily as needed for allergies.   chlorhexidine 0.12 % solution Commonly known as: PERIDEX Use as directed 15 mLs in the mouth or throat daily as needed.   Dexilant 60 MG capsule Generic drug: dexlansoprazole Take 1 capsule (60 mg total) by mouth daily. What changed: when to take this   diclofenac Sodium 1 % Gel Commonly known as: VOLTAREN Apply 4 g topically 4 (four) times daily as needed (pain).   EPINEPHrine 0.3 mg/0.3 mL Soaj injection Commonly known as: EPI-PEN Inject 0.3 mLs (0.3 mg total) into the muscle as needed.   FLUoxetine 20 MG capsule Commonly known as: PROZAC Take 1 capsule (20 mg total) by mouth daily. What changed: when to take this   fluticasone 50 MCG/ACT nasal spray Commonly known as: FLONASE Place 2 sprays into Gregory [name] nostrils daily as needed for allergies.   losartan 100 MG tablet Commonly known as: COZAAR Take 100 mg by mouth at bedtime.   methocarbamol 500 MG tablet Commonly known as: ROBAXIN Take 1 tablet (500 mg total) by mouth every 8 (eight) hours as needed for muscle spasms. What changed: when to take this   montelukast 10 MG tablet Commonly known as: SINGULAIR Take 1 tablet (10 mg total) by mouth at bedtime.   OLANZapine 2.5 MG tablet Commonly known as: ZYPREXA Take 1 tablet (2.5 mg total) by mouth at bedtime.   oxyCODONE 5 MG immediate release tablet Commonly known as: Oxy IR/ROXICODONE Take 1 tablet (5 mg total) by mouth every 4 (four) hours as needed for moderate pain (pain score 4-6). What changed:  when to take this reasons to take this   thiamine 100 MG tablet Commonly known as: VITAMIN B1 Take 100 mg by mouth daily.   topiramate 200 MG tablet Commonly known as: TOPAMAX Take  200 mg by mouth 2 (two) times daily as needed (migraines).   traZODone 50 MG tablet Commonly known as: DESYREL Take 1 tablet (50 mg total) by mouth at bedtime as needed for sleep. What changed: when to take this        Diagnostic Studies: No results found.  Disposition: Discharge disposition: 01-Home or Self Care       Discharge Instructions     Call MD / Call 911   Complete by: As directed    If you experience chest pain or shortness of breath, CALL 911 and be transported to the hospital emergency room.  If you develope a fever above 101 F,  pus (white drainage) or increased drainage or redness at the wound, or calf pain, call your surgeon's office.   Constipation Prevention   Complete by: As directed    Drink plenty of fluids.  Prune juice may be helpful.  You may use a stool softener, such as Colace (over the counter) 100 mg twice a day.  Use MiraLax (over the counter) for constipation as needed.   Diet - low sodium heart healthy   Complete by: As directed    Discharge instructions   Complete by: As directed    Starting after the block wears off, use the CPM machine 3 times per day for at least one hour each time; increase the degrees of range of motion with each session as you can tolerate.  No lifting with the operative arm or lifting the arm under its own power.  You may shower, dressings are waterproof.  Use the sling at all times, you may take it off for showering and to use the CPM machine.  You may come out of the sling 1-2 times per day to work on straightening your elbow, to avoid elbow stiffness.  Follow-up with Dr. August Saucer in the clinic at your given appointment date in ~1 week.  Call the office at [phone] with any questions/concerns or if you do not have a CPM machine.   Increase activity slowly as tolerated   Complete by: As directed    Post-operative opioid taper instructions:   Complete by: As directed    POST-OPERATIVE OPIOID TAPER INSTRUCTIONS: It is  important to wean off of your opioid medication as soon as possible. If you do not need pain medication after your surgery it is ok to stop day one. Opioids include: Codeine, Hydrocodone(Norco, Vicodin), Oxycodone(Percocet, oxycontin) and hydromorphone amongst others.  Long term and even short term use of opiods can cause: Increased pain response Dependence Constipation Depression Respiratory depression And more.  Withdrawal symptoms can include Flu like symptoms Nausea, vomiting And more Techniques to manage these symptoms Hydrate well Eat regular healthy meals Stay active Use relaxation techniques(deep breathing, meditating, yoga) Do Not substitute Alcohol to help with tapering If you have been on opioids for less than two weeks and do not have pain than it is ok to stop all together.  Plan to wean off of opioids This plan should start within one week post op of your joint replacement. Maintain the same interval or time between taking each dose and first decrease the dose.  Cut the total daily intake of opioids by one tablet each day Next start to increase the time between doses. The last dose that should be eliminated is the evening dose.             SignedKarenann Cai 09/24/2022, 12:40 PM

## 2022-09-24 NOTE — Plan of Care (Signed)
Patient ID: Gregory [name], male   DOB: [birth year], 62 y.o.   MRN: [id number]  Problem: Education: Goal: Knowledge of the prescribed therapeutic regimen will improve Outcome: Adequate for Discharge Goal: Understanding of activity limitations/precautions following surgery will improve Outcome: Adequate for Discharge Goal: Individualized Educational Video(s) Outcome: Adequate for Discharge   Problem: Activity: Goal: Ability to tolerate increased activity will improve Outcome: Adequate for Discharge   Problem: Pain Management: Goal: Pain level will decrease with appropriate interventions Outcome: Adequate for Discharge   Problem: Education: Goal: Knowledge of General Education information will improve Description: Including pain rating scale, medication(s)/side effects and non-pharmacologic comfort measures Outcome: Adequate for Discharge   Problem: Health Behavior/Discharge Planning: Goal: Ability to manage health-related needs will improve Outcome: Adequate for Discharge   Problem: Clinical Measurements: Goal: Ability to maintain clinical measurements within normal limits will improve Outcome: Adequate for Discharge Goal: Will remain free from infection Outcome: Adequate for Discharge Goal: Diagnostic test results will improve Outcome: Adequate for Discharge Goal: Respiratory complications will improve Outcome: Adequate for Discharge Goal: Cardiovascular complication will be avoided Outcome: Adequate for Discharge   Problem: Activity: Goal: Risk for activity intolerance will decrease Outcome: Adequate for Discharge   Problem: Nutrition: Goal: Adequate nutrition will be maintained Outcome: Adequate for Discharge   Problem: Coping: Goal: Level of anxiety will decrease Outcome: Adequate for Discharge   Problem: Elimination: Goal: Will not experience complications related to bowel motility Outcome: Adequate for Discharge Goal: Will not experience complications related  to urinary retention Outcome: Adequate for Discharge   Problem: Pain Managment: Goal: General experience of comfort will improve Outcome: Adequate for Discharge   Problem: Safety: Goal: Ability to remain free from injury will improve Outcome: Adequate for Discharge   Problem: Skin Integrity: Goal: Risk for impaired skin integrity will decrease Outcome: Adequate for Discharge   Problem: Acute Rehab OT Goals (only OT should resolve) Goal: OT Additional ADL Goal #1 Outcome: Adequate for Discharge Goal: OT Additional ADL Goal #2 Outcome: Adequate for Discharge  Lidia Collum, RN

## 2022-09-24 NOTE — Progress Notes (Signed)
Occupational Therapy Treatment Patient Details Name: Gregory [name] MRN: [id number] DOB: 1960/09/11 Today's Date: 09/24/2022   History of present illness 62 yo male s/p 8/6 L shoulder arthroscopy debridement mini open biceps tenodesis and rotator cuff tear repair distal clavicle excision PMH OSA on Cpap, PTSD TBI ETOH abuse cervical surg, memory loss gout bipolar   OT comments  Pt at adequate level for d/c home. Pt completed all HEP recommendations his session. Pt with handouts for sling, pendulums, hand/wrist/elbow movement, ice man machine, post operative shoulder recommendations and don doff sling. All education is complete and patient indicates understanding.       If plan is discharge home, recommend the following:  A little help with bathing/dressing/bathroom   Equipment Recommendations  None recommended by OT    Recommendations for Other Services      Precautions / Restrictions Precautions Precautions: Shoulder Shoulder Interventions: At all times;Shoulder sling/immobilizer;Off for dressing/bathing/exercises Restrictions Weight Bearing Restrictions: Yes LUE Weight Bearing: Non weight bearing       Mobility Bed Mobility               General bed mobility comments: sitting up on arrival    Transfers Overall transfer level: Modified independent                       Balance Overall balance assessment: No apparent balance deficits (not formally assessed)                             High Level Balance Comments: observed in the hall with speed that is normal , turning directions and scanning environment at indep level           ADL either performed or assessed with clinical judgement   ADL Overall ADL's : Needs assistance/impaired Eating/Feeding: Set up   Grooming: Set up           Upper Body Dressing : Minimal assistance Upper Body Dressing Details (indicate cue type and reason): EDUCATED on brace don doff with return  demo Lower Body Dressing: Minimal assistance Lower Body Dressing Details (indicate cue type and reason): (A) with putting on tennis shoes to help with balance               General ADL Comments: completed education on dressing, sling and washing on L arm    Extremity/Trunk Assessment Upper Extremity Assessment Upper Extremity Assessment: Left hand dominate LUE Deficits / Details: no more block present. reports minimal soreness and discomfort at this time            Vision       Perception     Praxis      Cognition Arousal: Alert Behavior During Therapy: [facility] for tasks assessed/performed Overall Cognitive Status: History of cognitive impairments - at baseline                                 General Comments: recalling information from prior session and recognized therapist without therapist approaching patient        Exercises Exercises: Other exercises Other Exercises Other Exercises: pendulum exercises x15 each and guided with handout Other Exercises: hand wrist and elbow exercises x15 reps guided with handout    Shoulder Instructions       General Comments      Pertinent Vitals/ Pain       Pain Assessment  Pain Assessment: No/denies pain  Home Living                                          Prior Functioning/Environment              Frequency  Min 1X/week        Progress Toward Goals  OT Goals(current goals can now be found in the care plan section)  Progress towards OT goals: Goals met/education completed, patient discharged from OT  Acute Rehab OT Goals Patient Stated Goal: to go home today OT Goal Formulation: With patient Time For Goal Achievement: 10/07/22 Potential to Achieve Goals: Good ADL Goals Additional ADL Goal #1: pt will complete pendulum exercises mod I with handout Additional ADL Goal #2: pt will don doff sling mod I  Plan      Co-evaluation                 AM-PAC OT "6  Clicks" Daily Activity     Outcome Measure   Help from another person eating meals?: None Help from another person taking care of personal grooming?: None Help from another person toileting, which includes using toliet, bedpan, or urinal?: A Little Help from another person bathing (including washing, rinsing, drying)?: A Little Help from another person to put on and taking off regular upper body clothing?: A Little Help from another person to put on and taking off regular lower body clothing?: A Little 6 Click Score: 20    End of Session Equipment Utilized During Treatment: Other (comment) (sling)  OT Visit Diagnosis: Unsteadiness on feet (R26.81);Muscle weakness (generalized) (M62.81)   Activity Tolerance Patient tolerated treatment well   Patient Left in bed;with call bell/phone within reach   Nurse Communication Mobility status;Precautions        Time: 7829-5621 OT Time Calculation (min): 20 min  Charges: OT General Charges $OT Visit: 1 Visit OT Treatments $Self Care/Home Management : 8-22 mins   Brynn, OTR/L  Acute Rehabilitation Services Office: [phone] .   Mateo Flow 09/24/2022, 10:27 AM

## 2022-10-01 ENCOUNTER — Encounter: Payer: Self-pay | Admitting: Surgical

## 2022-10-01 ENCOUNTER — Ambulatory Visit (INDEPENDENT_AMBULATORY_CARE_PROVIDER_SITE_OTHER): Payer: 59 | Admitting: Surgical

## 2022-10-01 DIAGNOSIS — Z9889 Other specified postprocedural states: Secondary | ICD-10-CM

## 2022-10-01 MED ORDER — GABAPENTIN 300 MG PO CAPS: 300.0000 mg | ORAL_CAPSULE | Freq: Three times a day (TID) | ORAL | 0 refills | Status: DC

## 2022-10-01 MED ORDER — OXYCODONE HCL 5 MG PO TABS: 5.0000 mg | ORAL_TABLET | ORAL | 0 refills | Status: DC | PRN
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
## 2022-10-05 DIAGNOSIS — M7522 Bicipital tendinitis, left shoulder: Secondary | ICD-10-CM

## 2022-10-05 DIAGNOSIS — M659 Synovitis and tenosynovitis, unspecified: Secondary | ICD-10-CM

## 2022-10-05 DIAGNOSIS — S43432A Superior glenoid labrum lesion of left shoulder, initial encounter: Secondary | ICD-10-CM

## 2022-10-05 DIAGNOSIS — M19011 Primary osteoarthritis, right shoulder: Secondary | ICD-10-CM

## 2022-10-05 DIAGNOSIS — M75122 Complete rotator cuff tear or rupture of left shoulder, not specified as traumatic: Secondary | ICD-10-CM

## 2022-10-16 ENCOUNTER — Other Ambulatory Visit: Payer: Self-pay | Admitting: Surgical

## 2022-10-16 ENCOUNTER — Ambulatory Visit (INDEPENDENT_AMBULATORY_CARE_PROVIDER_SITE_OTHER): Payer: 59 | Admitting: Orthopedic Surgery

## 2022-10-16 DIAGNOSIS — Z9889 Other specified postprocedural states: Secondary | ICD-10-CM

## 2022-10-17 ENCOUNTER — Other Ambulatory Visit: Payer: Self-pay

## 2022-10-17 ENCOUNTER — Ambulatory Visit: Payer: 59 | Admitting: Physical Therapy

## 2022-10-17 ENCOUNTER — Encounter: Payer: Self-pay | Admitting: Physical Therapy

## 2022-10-17 DIAGNOSIS — M25512 Pain in left shoulder: Secondary | ICD-10-CM | POA: Diagnosis not present

## 2022-10-17 DIAGNOSIS — M6281 Muscle weakness (generalized): Secondary | ICD-10-CM

## 2022-10-17 DIAGNOSIS — M25612 Stiffness of left shoulder, not elsewhere classified: Secondary | ICD-10-CM
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
## 2022-10-18 ENCOUNTER — Encounter: Payer: Self-pay | Admitting: Orthopedic Surgery
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
## 2022-10-21 ENCOUNTER — Other Ambulatory Visit: Payer: Self-pay | Admitting: Surgical

## 2022-10-21 ENCOUNTER — Telehealth: Payer: Self-pay | Admitting: Orthopedic Surgery

## 2022-10-21 MED ORDER — OXYCODONE HCL 5 MG PO TABS: 5.0000 mg | ORAL_TABLET | Freq: Three times a day (TID) | ORAL | 0 refills | Status: DC | PRN

## 2022-10-21 NOTE — Telephone Encounter (Signed)
Patient's wife called. He would like oxycodone called in for him. His cb# [phone]

## 2022-10-21 NOTE — Telephone Encounter (Signed)
Sent in

## 2022-10-22 ENCOUNTER — Encounter: Payer: 59 | Admitting: Rehabilitative and Restorative Service Providers"

## 2022-10-22 ENCOUNTER — Telehealth: Payer: Self-pay | Admitting: Rehabilitative and Restorative Service Providers"
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
## 2022-10-27 ENCOUNTER — Ambulatory Visit (INDEPENDENT_AMBULATORY_CARE_PROVIDER_SITE_OTHER): Payer: 59 | Admitting: Rehabilitative and Restorative Service Providers"

## 2022-10-27 ENCOUNTER — Encounter: Payer: Self-pay | Admitting: Rehabilitative and Restorative Service Providers"

## 2022-10-27 DIAGNOSIS — M6281 Muscle weakness (generalized): Secondary | ICD-10-CM | POA: Diagnosis not present

## 2022-10-27 DIAGNOSIS — M25612 Stiffness of left shoulder, not elsewhere classified: Secondary | ICD-10-CM | POA: Diagnosis not present

## 2022-10-27 DIAGNOSIS — M25512 Pain in left shoulder: Secondary | ICD-10-CM
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
## 2022-10-29 ENCOUNTER — Ambulatory Visit (INDEPENDENT_AMBULATORY_CARE_PROVIDER_SITE_OTHER): Payer: 59 | Admitting: Rehabilitative and Restorative Service Providers"

## 2022-10-29 DIAGNOSIS — M25512 Pain in left shoulder: Secondary | ICD-10-CM | POA: Diagnosis not present

## 2022-10-29 DIAGNOSIS — M25612 Stiffness of left shoulder, not elsewhere classified: Secondary | ICD-10-CM

## 2022-10-29 DIAGNOSIS — M6281 Muscle weakness (generalized): Secondary | ICD-10-CM | POA: Diagnosis not present
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
## 2022-11-03 ENCOUNTER — Encounter: Payer: 59 | Admitting: Rehabilitative and Restorative Service Providers"

## 2022-11-03 ENCOUNTER — Telehealth: Payer: Self-pay | Admitting: Rehabilitative and Restorative Service Providers"

## 2022-11-03 NOTE — Telephone Encounter (Signed)
Pt family member answered phone and indicated the patient wasn't feeling well.  They tried to call prior to office hours to leave a message about cancel but couldn't get through to leave a message.  I provided them with the direct PT front desk line as well as next appointment time.    Chyrel Masson, PT, DPT, OCS, ATC 11/03/22  8:21 AM

## 2022-11-05 ENCOUNTER — Encounter: Payer: Self-pay | Admitting: Rehabilitative and Restorative Service Providers"

## 2022-11-05 ENCOUNTER — Ambulatory Visit (INDEPENDENT_AMBULATORY_CARE_PROVIDER_SITE_OTHER): Payer: 59 | Admitting: Rehabilitative and Restorative Service Providers"

## 2022-11-05 DIAGNOSIS — M25612 Stiffness of left shoulder, not elsewhere classified: Secondary | ICD-10-CM | POA: Diagnosis not present

## 2022-11-05 DIAGNOSIS — M25512 Pain in left shoulder: Secondary | ICD-10-CM

## 2022-11-05 DIAGNOSIS — M6281 Muscle weakness (generalized): Secondary | ICD-10-CM
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
## 2022-11-10 ENCOUNTER — Ambulatory Visit (INDEPENDENT_AMBULATORY_CARE_PROVIDER_SITE_OTHER): Payer: 59 | Admitting: Rehabilitative and Restorative Service Providers"

## 2022-11-10 ENCOUNTER — Encounter: Payer: Self-pay | Admitting: Rehabilitative and Restorative Service Providers"

## 2022-11-10 DIAGNOSIS — M25612 Stiffness of left shoulder, not elsewhere classified: Secondary | ICD-10-CM | POA: Diagnosis not present

## 2022-11-10 DIAGNOSIS — M25512 Pain in left shoulder: Secondary | ICD-10-CM

## 2022-11-10 DIAGNOSIS — M6281 Muscle weakness (generalized): Secondary | ICD-10-CM
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
## 2022-11-11 ENCOUNTER — Other Ambulatory Visit: Payer: Self-pay | Admitting: Surgical

## 2022-11-12 ENCOUNTER — Telehealth: Payer: Self-pay | Admitting: Rehabilitative and Restorative Service Providers"

## 2022-11-12 ENCOUNTER — Encounter: Payer: 59 | Admitting: Rehabilitative and Restorative Service Providers"

## 2022-11-12 NOTE — Telephone Encounter (Signed)
Called after 15 mins no show for appointment.  Pt family member indicated getting the days wrong.  Rescheduled for tomorrow at 8 am.  Chyrel Masson, PT, DPT, OCS, ATC 11/12/22  8:19 AM

## 2022-11-13 ENCOUNTER — Telehealth: Payer: Self-pay | Admitting: Orthopedic Surgery

## 2022-11-13 ENCOUNTER — Other Ambulatory Visit: Payer: Self-pay | Admitting: Surgical

## 2022-11-13 ENCOUNTER — Encounter: Payer: 59 | Admitting: Physical Therapy

## 2022-11-13 MED ORDER — GABAPENTIN 300 MG PO CAPS: 300.0000 mg | ORAL_CAPSULE | Freq: Three times a day (TID) | ORAL | 0 refills | Status: DC

## 2022-11-13 NOTE — Telephone Encounter (Signed)
Refilled

## 2022-11-13 NOTE — Telephone Encounter (Signed)
Patient's wife called. He would like a refill on gabapentin. Cb# [phone]

## 2022-11-14 ENCOUNTER — Ambulatory Visit (INDEPENDENT_AMBULATORY_CARE_PROVIDER_SITE_OTHER): Payer: 59 | Admitting: Orthopedic Surgery

## 2022-11-14 ENCOUNTER — Encounter: Payer: Self-pay | Admitting: Orthopedic Surgery

## 2022-11-14 DIAGNOSIS — M5412 Radiculopathy, cervical region: Secondary | ICD-10-CM
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
## 2022-11-17 ENCOUNTER — Ambulatory Visit (INDEPENDENT_AMBULATORY_CARE_PROVIDER_SITE_OTHER): Payer: 59 | Admitting: Rehabilitative and Restorative Service Providers"

## 2022-11-17 ENCOUNTER — Encounter: Payer: Self-pay | Admitting: Rehabilitative and Restorative Service Providers"

## 2022-11-17 DIAGNOSIS — M25512 Pain in left shoulder: Secondary | ICD-10-CM | POA: Diagnosis not present

## 2022-11-17 DIAGNOSIS — M6281 Muscle weakness (generalized): Secondary | ICD-10-CM

## 2022-11-17 DIAGNOSIS — M25612 Stiffness of left shoulder, not elsewhere classified: Secondary | ICD-10-CM
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
## 2022-11-18 ENCOUNTER — Ambulatory Visit: Payer: 59 | Admitting: Sports Medicine
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
## 2022-11-19 ENCOUNTER — Encounter: Payer: 59 | Admitting: Rehabilitative and Restorative Service Providers"

## 2022-11-25 ENCOUNTER — Ambulatory Visit: Payer: 59 | Admitting: Sports Medicine

## 2022-11-25 ENCOUNTER — Encounter: Payer: Self-pay | Admitting: Sports Medicine

## 2022-11-25 ENCOUNTER — Ambulatory Visit: Payer: 59 | Admitting: Physical Therapy

## 2022-11-25 ENCOUNTER — Encounter: Payer: Self-pay | Admitting: Physical Therapy

## 2022-11-25 DIAGNOSIS — M25512 Pain in left shoulder: Secondary | ICD-10-CM | POA: Diagnosis not present

## 2022-11-25 DIAGNOSIS — G8929 Other chronic pain: Secondary | ICD-10-CM | POA: Diagnosis not present

## 2022-11-25 DIAGNOSIS — M25612 Stiffness of left shoulder, not elsewhere classified: Secondary | ICD-10-CM | POA: Diagnosis not present

## 2022-11-25 DIAGNOSIS — M75101 Unspecified rotator cuff tear or rupture of right shoulder, not specified as traumatic: Secondary | ICD-10-CM

## 2022-11-25 DIAGNOSIS — M12811 Other specific arthropathies, not elsewhere classified, right shoulder: Secondary | ICD-10-CM | POA: Diagnosis not present

## 2022-11-25 DIAGNOSIS — M25511 Pain in right shoulder: Secondary | ICD-10-CM | POA: Diagnosis not present

## 2022-11-25 DIAGNOSIS — M6281 Muscle weakness (generalized): Secondary | ICD-10-CM

## 2022-11-25 DIAGNOSIS — Z9889 Other specified postprocedural states: Secondary | ICD-10-CM

## 2022-11-25 DIAGNOSIS — M5412 Radiculopathy, cervical region: Secondary | ICD-10-CM | POA: Diagnosis not present

## 2022-11-25 MED ORDER — BUPIVACAINE HCL 0.25 % IJ SOLN
2.0000 mL | INTRAMUSCULAR | Status: AC | PRN
Start: 2022-11-25 — End: 2022-11-25
  Administered 2022-11-25: 2 mL via INTRA_ARTICULAR

## 2022-11-25 MED ORDER — METHYLPREDNISOLONE ACETATE 40 MG/ML IJ SUSP
40.0000 mg | INTRAMUSCULAR | Status: AC | PRN
Start: 2022-11-25 — End: 2022-11-25
  Administered 2022-11-25: 40 mg via INTRA_ARTICULAR

## 2022-11-25 MED ORDER — LIDOCAINE HCL 1 % IJ SOLN
2.0000 mL | INTRAMUSCULAR | Status: AC | PRN
Start: 2022-11-25 — End: 2022-11-25
  Administered 2022-11-25: 2 mL

## 2022-11-25 NOTE — Progress Notes (Signed)
Patient says that his right shoulder is doing the same thing as his left was before surgery. He says that when he tries to pick something up it feels as though it is giving out. He also says that like the left, he has aching and numbness/tingling that goes down his arm just past his elbow; these symptoms do not extend down the forearm or into the hand. Patient says that he has an MRI of his neck scheduled for 10/16 per Dr. August Saucer.
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
## 2022-11-27 ENCOUNTER — Encounter: Payer: 59 | Admitting: Rehabilitative and Restorative Service Providers"

## 2022-12-01 ENCOUNTER — Ambulatory Visit (INDEPENDENT_AMBULATORY_CARE_PROVIDER_SITE_OTHER): Payer: 59 | Admitting: Rehabilitative and Restorative Service Providers"

## 2022-12-01 ENCOUNTER — Encounter: Payer: Self-pay | Admitting: Rehabilitative and Restorative Service Providers"

## 2022-12-01 DIAGNOSIS — M6281 Muscle weakness (generalized): Secondary | ICD-10-CM

## 2022-12-01 DIAGNOSIS — M25612 Stiffness of left shoulder, not elsewhere classified: Secondary | ICD-10-CM

## 2022-12-01 DIAGNOSIS — M25512 Pain in left shoulder: Secondary | ICD-10-CM | POA: Diagnosis not present
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
## 2022-12-04 ENCOUNTER — Encounter: Payer: 59 | Admitting: Rehabilitative and Restorative Service Providers"

## 2022-12-05 ENCOUNTER — Other Ambulatory Visit: Payer: Self-pay | Admitting: Surgical

## 2022-12-06 ENCOUNTER — Ambulatory Visit
Admission: RE | Admit: 2022-12-06 | Discharge: 2022-12-06 | Disposition: A | Discharge status: Home / Self Care | Payer: 59 | Source: Ambulatory Visit | Attending: Orthopedic Surgery | Admitting: Orthopedic Surgery

## 2022-12-06 DIAGNOSIS — M5412 Radiculopathy, cervical region: Secondary | ICD-10-CM

## 2022-12-08 ENCOUNTER — Encounter: Payer: 59 | Admitting: Rehabilitative and Restorative Service Providers"

## 2022-12-10 ENCOUNTER — Ambulatory Visit (INDEPENDENT_AMBULATORY_CARE_PROVIDER_SITE_OTHER): Payer: 59 | Admitting: Rehabilitative and Restorative Service Providers"

## 2022-12-10 ENCOUNTER — Encounter: Payer: Self-pay | Admitting: Rehabilitative and Restorative Service Providers"

## 2022-12-10 DIAGNOSIS — M6281 Muscle weakness (generalized): Secondary | ICD-10-CM | POA: Diagnosis not present

## 2022-12-10 DIAGNOSIS — M25512 Pain in left shoulder: Secondary | ICD-10-CM | POA: Diagnosis not present

## 2022-12-10 DIAGNOSIS — M25612 Stiffness of left shoulder, not elsewhere classified: Secondary | ICD-10-CM
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
## 2022-12-15 ENCOUNTER — Encounter: Payer: 59 | Admitting: Rehabilitative and Restorative Service Providers"

## 2022-12-17 ENCOUNTER — Encounter: Payer: 59 | Admitting: Rehabilitative and Restorative Service Providers"

## 2022-12-31 ENCOUNTER — Other Ambulatory Visit (INDEPENDENT_AMBULATORY_CARE_PROVIDER_SITE_OTHER): Payer: 59

## 2022-12-31 ENCOUNTER — Other Ambulatory Visit: Payer: Self-pay

## 2022-12-31 ENCOUNTER — Ambulatory Visit (INDEPENDENT_AMBULATORY_CARE_PROVIDER_SITE_OTHER): Payer: 59 | Admitting: Orthopedic Surgery

## 2022-12-31 DIAGNOSIS — M25551 Pain in right hip: Secondary | ICD-10-CM

## 2022-12-31 DIAGNOSIS — M1611 Unilateral primary osteoarthritis, right hip: Secondary | ICD-10-CM | POA: Diagnosis not present

## 2022-12-31 DIAGNOSIS — M542 Cervicalgia: Secondary | ICD-10-CM

## 2023-01-03 ENCOUNTER — Encounter: Payer: Self-pay | Admitting: Orthopedic Surgery
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
## 2023-01-04 ENCOUNTER — Encounter: Payer: Self-pay | Admitting: Orthopedic Surgery

## 2023-01-04 MED ORDER — BUPIVACAINE HCL 0.25 % IJ SOLN
4.0000 mL | INTRAMUSCULAR | Status: AC | PRN
Start: 2022-12-31 — End: 2022-12-31
  Administered 2022-12-31: 4 mL via INTRA_ARTICULAR

## 2023-01-04 MED ORDER — METHYLPREDNISOLONE ACETATE 80 MG/ML IJ SUSP
80.0000 mg | INTRAMUSCULAR | Status: AC | PRN
Start: 2022-12-31 — End: 2022-12-31
  Administered 2022-12-31: 80 mg via INTRA_ARTICULAR

## 2023-01-04 MED ORDER — LIDOCAINE HCL 1 % IJ SOLN
5.0000 mL | INTRAMUSCULAR | Status: AC | PRN
Start: 2022-12-31 — End: 2022-12-31
  Administered 2022-12-31: 5 mL

## 2023-01-05 ENCOUNTER — Other Ambulatory Visit: Payer: Self-pay | Admitting: Surgical

## 2023-01-28 ENCOUNTER — Other Ambulatory Visit (INDEPENDENT_AMBULATORY_CARE_PROVIDER_SITE_OTHER): Payer: 59

## 2023-01-28 ENCOUNTER — Ambulatory Visit (INDEPENDENT_AMBULATORY_CARE_PROVIDER_SITE_OTHER): Payer: 59 | Admitting: Orthopedic Surgery

## 2023-01-28 DIAGNOSIS — M542 Cervicalgia: Secondary | ICD-10-CM

## 2023-01-28 DIAGNOSIS — M5412 Radiculopathy, cervical region: Secondary | ICD-10-CM | POA: Diagnosis not present
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
## 2023-02-03 NOTE — Addendum Note (Signed)
Addended by: Willia Craze on: 02/03/2023 08:08 PM   Modules accepted: Orders

## 2023-02-19 ENCOUNTER — Other Ambulatory Visit: Payer: Self-pay | Admitting: Physician Assistant

## 2023-02-19 DIAGNOSIS — R1310 Dysphagia, unspecified: Secondary | ICD-10-CM

## 2023-03-04 ENCOUNTER — Ambulatory Visit: Payer: 59 | Admitting: Orthopedic Surgery

## 2023-03-10 ENCOUNTER — Other Ambulatory Visit: Payer: 59

## 2023-04-02 ENCOUNTER — Ambulatory Visit: Payer: 59 | Admitting: Orthopedic Surgery

## 2023-04-02 DIAGNOSIS — M5412 Radiculopathy, cervical region: Secondary | ICD-10-CM

## 2023-04-02 NOTE — Progress Notes (Signed)
Orthopedic Spine Surgery Office Note   Assessment: Patient is a 63 y.o. male with neck pain that radiates into bilateral upper extremities. Has foraminal stenosis on the left at C6/7 and C7/T1     Plan: -Patient has tried PT, tylenol, ibuprofen, tramadol, cervical ESI -Patient's symptoms are more consistent with C7 or C8 radiculopathy on the left side.  However, there is no significant stenosis to explain his right-sided radiating arm pain.  For that reason, I recommended a EMG/NCS to evaluate further -Patient reports that he is nicotine free and uses a nicotine free vapes -Patient should return to office in 4 weeks, x-rays at next visit: none     Patient expressed understanding of the plan and all questions were answered to the patient's satisfaction.    ___________________________________________________________________________     History:   Patient is a 63 y.o. male who presents today for follow-up on his cervical spine.  Patient has a history of multiple neck surgeries.  To be specific, he has had a 4.  He states that he gets better after the surgeries for a while but then gets return of pain.  Currently, he is feeling pain radiating into his bilateral upper extremities.  He says he feels a going from the neck all the way to the hands on both sides.  He says he feels it in all the fingers.  He still has numbness and paresthesias that radiate past the elbow along the ulnar aspect of the forearm into all the digits of the hand on the right side.  On the left side, he says he has numbness and paresthesias in the hand and all the fingers.  No other numbness or paresthesias.  He feels that his arms are weaker on both sides.   Treatments tried: PT, tylenol, ibuprofen, tramadol, cervical ESI    Physical Exam:   General: no acute distress, appears stated age Neurologic: alert, answering questions appropriately, following commands Respiratory: unlabored breathing on room air, symmetric chest  rise Psychiatric: appropriate affect, normal cadence to speech     MSK (spine):   -Strength exam                                                   Left                  Right Grip strength                5/5                  5/5 Interosseus                  5/5                  5/5 Wrist extension            5/5                  5/5 Wrist flexion                 5/5                  5/5 Elbow flexion                5/5  5/5 Deltoid                          5/5                  5/5   -Sensory exam                           Sensation intact to light touch in C5-T1 nerve distributions of bilateral upper extremities   -Brachioradialis DTR: 2/4 on the left, 2/4 on the right -Biceps DTR: 2/4 on the left, 2/4 on the right -Triceps DTR: 2/4 on the left, 2/4 ont the right   -Spurling: negative bilaterally -Hoffman sign: negative bilaterally -Clonus: no beats bilaterally -Interosseous wasting: none seen -Grip and release test: negative   Tinel's at wrist: negative bilaterally  Phalen's at wrist: negative bilaterally  Durkan's: negative bilaterally    Tinel's at elbow: negative bilaterally    Imaging: XRs of the cervical spine from 01/28/2023 were previously independently reviewed and interpreted, showing anterior instrumentation at C2/3 with fusion mass across the former disc space. No lucency around the screws. There appears to be autofusion or prior uninstrumented fusion across the C3/4 disc space. Anterior instrumentation at C5/6 with fusion mass across the disc space. No lucency around the screws. No fracture or dislocation seen. No evidence of instability on flexion/extension views.    MRI of the cervical spine from 12/06/2022 was previously independently reviewed and interpreted, showing foraminotomy defect on the left at C4/5. Left sided foraminal stenosis seen at C6/7 and C7/T1. No central stenosis seen.      Patient name: Gregory [name] Patient MRN:  [id number] Date of visit: 04/02/23

## 2023-04-06 ENCOUNTER — Encounter: Payer: Self-pay | Admitting: Neurology

## 2023-04-06 ENCOUNTER — Other Ambulatory Visit: Payer: Self-pay

## 2023-04-06 DIAGNOSIS — R202 Paresthesia of skin: Secondary | ICD-10-CM

## 2023-04-07 ENCOUNTER — Encounter: Payer: Self-pay | Admitting: Physician Assistant

## 2023-04-15 ENCOUNTER — Ambulatory Visit: Payer: 59 | Admitting: Surgical

## 2023-04-15 ENCOUNTER — Other Ambulatory Visit (INDEPENDENT_AMBULATORY_CARE_PROVIDER_SITE_OTHER): Payer: Self-pay

## 2023-04-15 DIAGNOSIS — G8929 Other chronic pain: Secondary | ICD-10-CM

## 2023-04-15 DIAGNOSIS — M25562 Pain in left knee: Secondary | ICD-10-CM

## 2023-04-15 DIAGNOSIS — M5441 Lumbago with sciatica, right side: Secondary | ICD-10-CM

## 2023-04-15 MED ORDER — METHYLPREDNISOLONE 4 MG PO TBPK: ORAL_TABLET | ORAL | 0 refills | Status: DC

## 2023-04-26 ENCOUNTER — Encounter: Payer: Self-pay | Admitting: Surgical
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
## 2023-04-29 ENCOUNTER — Ambulatory Visit
Admission: RE | Admit: 2023-04-29 | Discharge: 2023-04-29 | Disposition: A | Discharge status: Home / Self Care | Payer: 59 | Source: Ambulatory Visit | Attending: Surgical

## 2023-04-29 DIAGNOSIS — G8929 Other chronic pain: Secondary | ICD-10-CM

## 2023-04-30 ENCOUNTER — Ambulatory Visit: Payer: 59 | Admitting: Orthopedic Surgery

## 2023-05-06 ENCOUNTER — Ambulatory Visit (INDEPENDENT_AMBULATORY_CARE_PROVIDER_SITE_OTHER): Admitting: Surgical

## 2023-05-06 DIAGNOSIS — M48061 Spinal stenosis, lumbar region without neurogenic claudication: Secondary | ICD-10-CM

## 2023-05-06 DIAGNOSIS — Z96652 Presence of left artificial knee joint: Secondary | ICD-10-CM | POA: Diagnosis not present

## 2023-05-06 DIAGNOSIS — T8484XA Pain due to internal orthopedic prosthetic devices, implants and grafts, initial encounter: Secondary | ICD-10-CM | POA: Diagnosis not present

## 2023-05-07 LAB — C-REACTIVE PROTEIN: CRP: 6.3 mg/L (ref <8.0)

## 2023-05-07 LAB — CBC WITH DIFFERENTIAL/PLATELET
Absolute Lymphocytes: 1411 {cells}/uL (ref 850–3900)
Absolute Monocytes: 725 {cells}/uL (ref 200–950)
Basophils Absolute: 38 {cells}/uL (ref 0–200)
Basophils Relative: 0.8 %
Eosinophils Absolute: 643 {cells}/uL — ABNORMAL HIGH (ref 15–500)
Eosinophils Relative: 13.4 %
HCT: 38.8 % (ref 38.5–50.0)
Hemoglobin: 12.5 g/dL — ABNORMAL LOW (ref 13.2–17.1)
MCH: 29.3 pg (ref 27.0–33.0)
MCHC: 32.2 g/dL (ref 32.0–36.0)
MCV: 90.9 fL (ref 80.0–100.0)
MPV: 10.4 fL (ref 7.5–12.5)
Monocytes Relative: 15.1 %
Neutro Abs: 1982 {cells}/uL (ref 1500–7800)
Neutrophils Relative %: 41.3 %
Platelets: 275 10*3/uL (ref 140–400)
RBC: 4.27 10*6/uL (ref 4.20–5.80)
RDW: 12.7 % (ref 11.0–15.0)
Total Lymphocyte: 29.4 %
WBC: 4.8 10*3/uL (ref 3.8–10.8)

## 2023-05-07 LAB — SEDIMENTATION RATE: Sed Rate: 9 mm/h (ref 0–20)

## 2023-05-10 ENCOUNTER — Encounter: Payer: Self-pay | Admitting: Surgical
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
## 2023-05-11 ENCOUNTER — Ambulatory Visit (INDEPENDENT_AMBULATORY_CARE_PROVIDER_SITE_OTHER): Payer: 59 | Admitting: Neurology

## 2023-05-11 DIAGNOSIS — G5621 Lesion of ulnar nerve, right upper limb: Secondary | ICD-10-CM

## 2023-05-11 DIAGNOSIS — G5603 Carpal tunnel syndrome, bilateral upper limbs: Secondary | ICD-10-CM

## 2023-05-11 DIAGNOSIS — G5622 Lesion of ulnar nerve, left upper limb: Secondary | ICD-10-CM

## 2023-05-11 DIAGNOSIS — R202 Paresthesia of skin: Secondary | ICD-10-CM | POA: Diagnosis not present

## 2023-05-11 DIAGNOSIS — M5412 Radiculopathy, cervical region: Secondary | ICD-10-CM

## 2023-05-11 NOTE — Procedures (Signed)
 [facility] Neurology  [address] [place], [address]  [place], [place] [address] Tel: [phone] Fax: [phone] Test Date:  05/11/2023  [name] DOB: 06-16-1960 Physician: Jacquelyne Balint, MD  Sex: Male Height: 5\' 7"  Ref Phys: Willia Craze, MD  ID#: [id number]   Technician:    History: This is a 63 year old male with neck pain radiating into arms.  NCV & EMG Findings: Extensive electrodiagnostic evaluation of bilateral upper limbs shows: Bilateral ulnar sensory responses are absent. Bilateral median sensory responses show reduced amplitude (L8, R6 V). Bilateral radial sensory responses are within normal limits. Bilateral ulnar (ADM) motor responses show reduced conduction velocity from above elbow to below elbow stimulation sites (L33, R37 m/s). Bilateral median (APB) motor responses are within normal limits. Chronic motor axon loss changes without accompanying active denervation changes are seen in bilateral first dorsal interosseous, bilateral flexor digitorum profundus to digits 4,5, bilateral pronator teres, bilateral triceps, right biceps, and right deltoid muscles. Cervical paraspinal muscles were not evaluated due to prior cervical spine surgery.  Impression: This is a complex, abnormal study. The findings are most consistent with the following: The residuals of old intraspinal canal lesions (ie: motor radiculopathy) at bilateral C7, right C5, and right C6 roots or segments. The findings are mild in degree electrically. Evidence of bilateral ulnar mononeuropathy at the elbow, consistent with prior ulnar compression surgeries bilaterally, without active/ongoing denervation changes. Evidence of the residuals of bilateral median mononeuropathy at or distal to the wrist, consistent with known and prior carpal tunnel syndrome decompression.    ___________________________ Jacquelyne Balint, MD    Nerve Conduction Studies Motor Nerve Results    Latency Amplitude F-Lat  Segment Distance CV Comment  Site (ms) Norm (mV) Norm (ms)  (cm) (m/s) Norm   Left Median (APB) Motor  Wrist 3.3  < 4.0 7.1  > 5.0        Elbow 8.9 - 6.7 -  Elbow-Wrist 31.5 56  > 50   Right Median (APB) Motor  Wrist 3.0  < 4.0 9.0  > 5.0        Elbow 8.6 - 7.3 -  Elbow-Wrist 31 55  > 50   Left Ulnar (ADM) Motor  Wrist 1.98  < 3.1 14.1  > 7.0        Bel elbow 6.0 - 12.8 -  Bel elbow-Wrist 23 58  > 50   Ab elbow 9.0 - 11.1 -  Ab elbow-Bel elbow 10 33 -   Right Ulnar (ADM) Motor  Wrist 1.95  < 3.1 11.9  > 7.0        Bel elbow 6.5 - 10.4 -  Bel elbow-Wrist 24 52  > 50   Ab elbow 9.2 - 9.7 -  Ab elbow-Bel elbow 10 37 -    Sensory Sites    Neg Peak Lat Amplitude (O-P) Segment Distance Velocity Comment  Site (ms) Norm (V) Norm  (cm) (ms)   Left Median Sensory  Wrist-Dig II 3.5  < 3.8 *8  > 10 Wrist-Dig II 13    Right Median Sensory  Wrist-Dig II 3.4  < 3.8 *6  > 10 Wrist-Dig II 13    Left Radial Sensory  Forearm-Wrist 2.2  < 2.8 10  > 10 Forearm-Wrist 10    Right Radial Sensory  Forearm-Wrist 2.2  < 2.8 11  > 10 Forearm-Wrist 10    Left Ulnar Sensory  Wrist-Dig V *NR  < 3.2 *NR  > 5 Wrist-Dig V 11  Right Ulnar Sensory  Wrist-Dig V *NR  < 3.2 *NR  > 5 Wrist-Dig V 11     Electromyography   Side Muscle Ins.Act Fibs Fasc Recrt Amp Dur Poly Activation Comment  Right FDI Nml Nml Nml *1- *1+ *1+ Nml Nml N/A  Right EIP Nml Nml Nml Nml Nml Nml Nml Nml N/A  Right Pronator teres Nml Nml Nml *1- *1+ *1+ Nml Nml N/A  Right FDP Nml Nml Nml *1- *1+ *1+ Nml Nml N/A  Right Biceps Nml Nml Nml *1- *1+ *1+ Nml Nml N/A  Right Triceps Nml Nml Nml *1- *1+ *1+ Nml Nml N/A  Right Deltoid Nml Nml Nml *1- *1+ *1+ Nml Nml N/A  Left FDI Nml Nml Nml *1- *1+ *1+ Nml Nml N/A  Left EIP Nml Nml Nml Nml Nml Nml Nml Nml N/A  Left Pronator teres Nml Nml Nml *1- *1+ *1+ Nml Nml N/A  Left FDP Nml Nml Nml *1- *1+ *1+ Nml Nml N/A  Left Biceps Nml Nml Nml Nml Nml Nml Nml Nml N/A  Left Triceps Nml Nml Nml *1- *1+  *1+ Nml Nml N/A  Left Deltoid Nml Nml Nml Nml Nml Nml Nml Nml N/A      Waveforms:  Motor           Sensory

## 2023-05-14 ENCOUNTER — Ambulatory Visit (INDEPENDENT_AMBULATORY_CARE_PROVIDER_SITE_OTHER): Admitting: Orthopedic Surgery

## 2023-05-14 DIAGNOSIS — M545 Low back pain, unspecified: Secondary | ICD-10-CM | POA: Diagnosis not present

## 2023-05-14 MED ORDER — PREGABALIN 75 MG PO CAPS: 75.0000 mg | ORAL_CAPSULE | Freq: Two times a day (BID) | ORAL | 1 refills | Status: DC

## 2023-05-14 NOTE — Progress Notes (Signed)
 Orthopedic Spine Surgery Office Note   Assessment: Patient is a 63 y.o. male with neck pain that radiates into bilateral upper extremities. Has foraminal stenosis on the left at C6/7 and C7/T1. EMG/NCS shows evidence of bilateral C7, right C5, and right C6 radiculopathy. In his lumbar spine, he does have DDD at L5/S1 with a disc herniation that abuts the S1 nerve roots     Plan: -Patient has tried PT, tylenol, ibuprofen, tramadol, cervical ESI -For cervical spine, his EMG/NCS does show evidence of bilateral C7 nerve root irritation.  I do not see any significant foraminal stenosis on his MRI to explain that though.  I feel that some of his radiating left arm pain could improve with a ACDF -Currently, his back is more painful.  He has some pain that radiates into the lateral thighs but the majority of his pain is in the low back.  He does have DDD at L5/S1 with a disc herniation that abuts the S1 nerve roots.  On exam though he displayed several Waddel signs and I explained to him that I am not sure surgery would give him the complete relief he is looking for. It may help him to some extent but discectomy takes pressure off the nerves going to the legs and would likely not help with his back pain -I referred him to Dr. [place] office to see if he thinks he would be a candidate for spinal cord stimulator -Patient should return to office on an as needed basis     Patient expressed understanding of the plan and all questions were answered to the patient's satisfaction.    ___________________________________________________________________________     History:   Patient is a 63 y.o. male who presents today for follow-up on his cervical spine and to discuss his lumbar spine.  He is still having neck pain that radiates into his bilateral upper extremities.  He feels the pain going all the way to his hands on both sides.  He feels it in all of the fingers.  He says that the left side is more painful  than the right side.  After last visit, he got his EMG/NCS.  He has not noticed any changes in his neck or upper extremity symptoms since he was last seen.  He also wanted discuss his lumbar spine.  He has had several years of low back pain.  He also has pain that radiates into the bilateral groin and the bilateral lateral thighs.  He says he feels the pain on a daily basis.  He notes the pain at rest and with activity.  It is generally worse with activity.  He notes the majority of the pain is in the low back.  He has not found any treatments helpful in reducing his pain.  He said his low back pain is more significant than his neck and arm pain.  He has not have any pain rating past knees on either side.  There was no trauma or injury that preceded the onset of pain.   Treatments tried: PT, tylenol, ibuprofen, tramadol, cervical ESI     Physical Exam:   General: no acute distress, appears stated age Neurologic: alert, answering questions appropriately, following commands Respiratory: unlabored breathing on room air, symmetric chest rise Psychiatric: appropriate affect, normal cadence to speech     MSK (spine):   -Strength exam  Left                  Right Grip strength                5/5                  5/5 Interosseus                  5/5                  5/5 Wrist extension            5/5                  5/5 Wrist flexion                 5/5                  5/5 Elbow flexion                5/5                  5/5 Deltoid                          5/5                  5/5   EHL    4/5  4/5 TA    4/5  4/5 GSC    4/5  4/5 Knee extension  4/5  4/5 Hip flexion   4/5  4/5  Strength exam of lower extremities limited by pain  -Sensory exam                           Sensation intact to light touch in C5-T1 nerve distributions of bilateral upper extremities   Sensation intact to light touch in L3-S1 nerve distributions of bilateral  lower extremities  -Pain in his low back reproduced with rotation of both the pelvis and the spine simultaneously -Had significant pain just trying to lay down on the exam table.  He was groaning, wincing in pain, and writhing around -Pain with internal and external rotation at the hip.  Groaning in pain   Imaging: XRs of the cervical spine from 01/28/2023 were previously independently reviewed and interpreted, showing anterior instrumentation at C2/3 with fusion mass across the former disc space. No lucency around the screws. There appears to be autofusion or prior uninstrumented fusion across the C3/4 disc space. Anterior instrumentation at C5/6 with fusion mass across the disc space. No lucency around the screws. No fracture or dislocation seen. No evidence of instability on flexion/extension views.    MRI of the cervical spine from 12/06/2022 was previously independently reviewed and interpreted, showing foraminotomy defect on the left at C4/5. Left sided foraminal stenosis seen at C6/7 and C7/T1. No central stenosis seen.   MRI of the lumbar spine from 04/29/2023 was independently reviewed and interpreted, showing foraminal stenosis on the right at L3/4.  DDD at L5/S1.  Central disc herniation at L5/S1 that abuts the S1 nerve root particularly on the right.    Patient name: Gregory [name] Patient MRN: [id number] Date of visit: 05/14/23

## 2023-05-26 ENCOUNTER — Encounter: Payer: Self-pay | Admitting: Physical Medicine and Rehabilitation

## 2023-05-26 ENCOUNTER — Ambulatory Visit (INDEPENDENT_AMBULATORY_CARE_PROVIDER_SITE_OTHER): Admitting: Physical Medicine and Rehabilitation

## 2023-05-26 DIAGNOSIS — M5136 Other intervertebral disc degeneration, lumbar region with discogenic back pain only: Secondary | ICD-10-CM | POA: Diagnosis not present

## 2023-05-26 DIAGNOSIS — G894 Chronic pain syndrome: Secondary | ICD-10-CM

## 2023-05-26 DIAGNOSIS — M47819 Spondylosis without myelopathy or radiculopathy, site unspecified: Secondary | ICD-10-CM

## 2023-05-26 DIAGNOSIS — G8929 Other chronic pain: Secondary | ICD-10-CM

## 2023-05-26 DIAGNOSIS — M545 Low back pain, unspecified: Secondary | ICD-10-CM
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
## 2023-05-26 NOTE — Progress Notes (Signed)
 Pain Scale   Average Pain 4 Patient advising he has back pain everyday and just performing daily activities is a chore. Pain with everything he does. He is also advising he just tolerates pain, he does advis he stumbles a lot more now that before.         +Driver, -BT, -Dye Allergies.

## 2023-06-11 ENCOUNTER — Other Ambulatory Visit: Payer: Self-pay | Admitting: Sports Medicine

## 2023-06-11 ENCOUNTER — Other Ambulatory Visit: Payer: Self-pay | Admitting: Surgical

## 2023-06-18 ENCOUNTER — Encounter: Payer: Self-pay | Admitting: Sports Medicine

## 2023-06-18 ENCOUNTER — Ambulatory Visit: Admitting: Sports Medicine

## 2023-06-18 DIAGNOSIS — M47819 Spondylosis without myelopathy or radiculopathy, site unspecified: Secondary | ICD-10-CM

## 2023-06-18 DIAGNOSIS — M48061 Spinal stenosis, lumbar region without neurogenic claudication: Secondary | ICD-10-CM

## 2023-06-18 DIAGNOSIS — Z981 Arthrodesis status: Secondary | ICD-10-CM | POA: Diagnosis not present

## 2023-06-18 DIAGNOSIS — M5412 Radiculopathy, cervical region: Secondary | ICD-10-CM

## 2023-06-18 MED ORDER — METHYLPREDNISOLONE 4 MG PO TBPK: ORAL_TABLET | ORAL | 0 refills | Status: AC
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
## 2023-06-18 NOTE — Progress Notes (Signed)
 Patient says that he has had an appointment to discuss a spinal cord stimulator, and is now waiting on approval from insurance to move forward with that. He is here today due to increasing neck and bilateral shoulder pain, with the left shoulder worse than the right. He says that his surgery has not given him pain relief as he had hoped, and his pain wraps from the back of the shoulder over the top and through the front. His neck pain is down the midline of his cervical spine.

## 2023-06-23 ENCOUNTER — Other Ambulatory Visit: Payer: Self-pay | Admitting: Sports Medicine

## 2023-07-20 ENCOUNTER — Other Ambulatory Visit: Payer: Self-pay | Admitting: Orthopedic Surgery

## 2023-07-20 ENCOUNTER — Other Ambulatory Visit: Payer: Self-pay | Admitting: Sports Medicine

## 2023-08-18 ENCOUNTER — Other Ambulatory Visit: Payer: Self-pay | Admitting: Orthopedic Surgery

## 2023-08-18 ENCOUNTER — Other Ambulatory Visit: Payer: Self-pay | Admitting: Sports Medicine

## 2023-08-27 ENCOUNTER — Ambulatory Visit: Admitting: Physical Medicine and Rehabilitation

## 2023-09-02 ENCOUNTER — Other Ambulatory Visit: Payer: Self-pay | Admitting: Physical Medicine and Rehabilitation

## 2023-09-02 MED ORDER — DIAZEPAM 5 MG PO TABS: ORAL_TABLET | ORAL | 0 refills | Status: AC

## 2023-09-03 ENCOUNTER — Other Ambulatory Visit: Payer: Self-pay

## 2023-09-03 ENCOUNTER — Ambulatory Visit (INDEPENDENT_AMBULATORY_CARE_PROVIDER_SITE_OTHER): Admitting: Physical Medicine and Rehabilitation

## 2023-09-03 VITALS — BP 112/71 | HR 67

## 2023-09-03 DIAGNOSIS — M5416 Radiculopathy, lumbar region: Secondary | ICD-10-CM | POA: Diagnosis not present

## 2023-09-03 DIAGNOSIS — G894 Chronic pain syndrome: Secondary | ICD-10-CM

## 2023-09-03 DIAGNOSIS — M48061 Spinal stenosis, lumbar region without neurogenic claudication: Secondary | ICD-10-CM

## 2023-09-03 MED ORDER — CEPHALEXIN 250 MG PO CAPS: 250.0000 mg | ORAL_CAPSULE | Freq: Two times a day (BID) | ORAL | 0 refills | Status: AC

## 2023-09-03 MED ORDER — LIDOCAINE HCL (PF) 1 % IJ SOLN
2.0000 mL | Freq: Once | INTRAMUSCULAR | Status: AC
  Administered 2023-09-03: 2 mL

## 2023-09-03 NOTE — Progress Notes (Signed)
 Pain Scale   Average Pain 8 Patient advised he has chronic lower back pain radiating to both legs        +Driver, -BT, -Dye Allergies.

## 2023-09-03 NOTE — Progress Notes (Signed)
 Gregory [name] - 63 y.o. male MRN [id number]  Date of birth: August 11, 1960  Office Visit Note: Visit Date: 09/03/2023 PCP: Campbell Reynolds, NP Referred by: Campbell Reynolds, NP  Subjective: Chief Complaint  Patient presents with   Lower Back - Pain   HPI:  Gregory [name] is a 63 y.o. male who comes in today for planned spinal cord stimulator trial.  The patient has failed conservative care including PT/home exercise, medications, time and activity modification.  The patient has chronic, severe and recalcitrant low back pain with neurogenic pain and has failed all other interventional spine procedures.  This represents a procedure of last resort for recalcitrant pain. Pre-procedure psychological evaluation has been completed and procedure pre-authorization has been obtained.  Please see requesting physician  and our notes for further details and justification.   Referring: Dr. Ozell Ada    ROS Otherwise per HPI.  Assessment & Plan: Visit Diagnoses:    ICD-10-CM   1. Lumbar radiculopathy  M54.16 XR C-ARM NO REPORT    Spinal Cord Stimulator - Analysis    Spinal Cord Stimulator - Placement    lidocaine  (PF) (XYLOCAINE ) 1 % injection 2 mL    2. Foraminal stenosis of lumbar region  M48.061 XR C-ARM NO REPORT    Spinal Cord Stimulator - Analysis    Spinal Cord Stimulator - Placement    lidocaine  (PF) (XYLOCAINE ) 1 % injection 2 mL    3. Chronic pain syndrome  G89.4 XR C-ARM NO REPORT    Spinal Cord Stimulator - Analysis    Spinal Cord Stimulator - Placement    lidocaine  (PF) (XYLOCAINE ) 1 % injection 2 mL      Plan: No additional findings.   Meds & Orders:  Meds ordered this encounter  Medications   lidocaine  (PF) (XYLOCAINE ) 1 % injection 2 mL   cephALEXin  (KEFLEX ) 250 MG capsule    Sig: Take 1 capsule (250 mg total) by mouth 2 (two) times daily.    Dispense:  14 capsule    Refill:  0    Orders Placed This Encounter  Procedures   Spinal Cord Stimulator - Analysis   Spinal  Cord Stimulator - Placement   XR C-ARM NO REPORT    Follow-up: Return in about 1 week (around 09/10/2023) for SCS lead pull.   Procedures: No procedures performed  Spinal Cord Stimulator Trial  [name]      Date of Birth: May 12, 1960 MRN: [id number] PCP: Campbell Reynolds, NP      Visit Date: 09/03/2023   Universal Protocol:    Date/Time: 09/02/2509:28 AM  Consent Given By: the patient  Position: PRONE  Additional Comments: Vital signs were monitored before and after the procedure. Patient was prepped and draped in the usual sterile fashion. The correct patient, procedure, and site was verified.   Injection Procedure Details:  Procedure Site One Meds Administered:  Meds ordered this encounter  Medications   lidocaine  (PF) (XYLOCAINE ) 1 % injection 2 mL   cephALEXin  (KEFLEX ) 250 MG capsule    Sig: Take 1 capsule (250 mg total) by mouth 2 (two) times daily.    Dispense:  14 capsule    Refill:  0     Location/Site: Leads were placed just to the left and right of midline with the upper most lead at the top of the T7 vertebral body just at the superior endplate and then spanning 3 vertebral bodies with the inferior lead at the bottom of the T9 vertebral body.  Needle size: 14 gauge   Needle type: EPIMED RX Coud Epidural Needle  Needle Placement: T10-11 Dorsal epidural space, T12 with small riblets, Lowest vertebral body is designated L5  Findings:  -  -Comments: Excellent paresthesia coverage was obtained in all of the areas of the patient's normal pain  Procedure Details: After localization of the T11-12 epidural space and the overlying soft tissue, the needle was introduced two bodies below this level to produce an adequate angle for epidural penetration. The soft tissue was infiltrated with 2-3 mls. of 1% Lidocaine  without Epinephrine . Using the posterolateral approach, the #14 gauge Epimed needle was inserted down to the posterior aspect of the L1 lamina  and then walked off the superior aspect of this lamina into the T11-12 epidural space. The epidural space was localized with loss of resistance and negative aspirate for CSF or blood. The [employer] (16) electrode stimulation lead was passed up so that just the superior most lead was at the location noted above while maintaining the lead in the midline.   The above procedure was repeated for a second Administrator, arts [employer] (16) electrode stimulation lead for the opposite side. The pulse generator system was adjusted and programmed by myself and the company technical representative by varying the stimulator sites, rates, pulse amplitude, and duration. Adequate coverage was obtained as described above.  Subsequent to this, the introducer needle was removed and the spinal cord stimulator trial lead was fastened to the skin using steri-strips and strain reduction loop. The lead wire was then connected and Tegaderm was applied to produce an occlusive dressing. The patient was then brought out of the procedure room and was given a portable stimulator.  The patient will follow-up in three to seven days to determine whether this was efficacious.  Oral prophylactic antibiotic, either Keflex  or clindamycin, was prescribed as noted.   Additional Comments:  The patient tolerated the procedure well Dressing: As noted above    Post-procedure details: Patient was observed during the procedure. Post-procedure instructions were reviewed.  Patient left the clinic in stable condition.    Advanced programming analysis performed with the [facility] with physician input and  monitoring.  [employer] FAST and Careers information officer with  combined therapy initiated.    Clinical History: CLINICAL DATA:  Lumbar radiculopathy   EXAM: MRI LUMBAR SPINE WITHOUT CONTRAST   TECHNIQUE: Multiplanar, multisequence MR imaging of the lumbar spine  was performed. No intravenous contrast was administered.   COMPARISON:  MRI of the lumbar spine dated 08/16/2018   FINDINGS: Segmentation: Standard.   Alignment:  Physiologic lumbar alignment is maintained.   Vertebrae: Vertebral bodies demonstrate normal signal intensity. No compression fractures.   Conus medullaris and cauda equina: The conus medullaris terminates at the level of L1-L2. The distal spinal cord signal intensity is normal.   Paraspinal and other soft tissues: The visualized abdomen and pelvis show no soft tissue abnormality. The visualized aorta is normal.   Disc levels:   L1-L2: Disc is normal in configuration. Moderate bilateral facet arthropathy. Mild bilateral neuroforaminal stenosis. No spinal canal stenosis.   L2-L3: Disc bulge. Moderate bilateral facet arthropathy. Moderate bilateral neuroforaminal stenosis. No spinal canal stenosis.   L3-L4: Disc bulge. Moderate bilateral facet arthropathy. Severe bilateral neuroforaminal stenosis. Mild spinal canal stenosis.   L4-L5: Disc bulge. Moderate bilateral facet arthropathy. Moderate bilateral neuroforaminal stenosis. No spinal canal stenosis.   L5-S1: Disc bulge with central protrusion. Moderate bilateral facet arthropathy. Moderate bilateral neuroforaminal stenosis.  Mild spinal canal stenosis.   IMPRESSION: Spondylotic changes with moderate and severe foraminal stenoses at multiple levels throughout the lumbar spine secondary to disc bulging and facet arthropathy. Mild canal stenoses at L3-L4 and L5-S1.     Electronically Signed   By: Clem Savory M.D.   On: 05/19/2023 09:54     Objective:  VS:  HT:    WT:   BMI:     BP:112/71  HR:67bpm  TEMP: ( )  RESP:  Physical Exam Vitals and nursing note reviewed.  Constitutional:      General: He is not in acute distress.    Appearance: Normal appearance. He is not ill-appearing.  HENT:     Head: Normocephalic and atraumatic.     Right Ear:  External ear normal.     Left Ear: External ear normal.     Nose: No congestion.  Eyes:     Extraocular Movements: Extraocular movements intact.  Cardiovascular:     Rate and Rhythm: Normal rate.     Pulses: Normal pulses.  Pulmonary:     Effort: Pulmonary effort is normal. No respiratory distress.  Abdominal:     General: There is no distension.     Palpations: Abdomen is soft.  Musculoskeletal:        General: No tenderness or signs of injury.     Cervical back: Neck supple.     Right lower leg: No edema.     Left lower leg: No edema.     Comments: Patient has good distal strength without clonus.  Skin:    Findings: No erythema or rash.  Neurological:     General: No focal deficit present.     Mental Status: He is alert and oriented to person, place, and time.     Sensory: No sensory deficit.     Motor: No weakness or abnormal muscle tone.     Coordination: Coordination normal.  Psychiatric:        Mood and Affect: Mood normal.        Behavior: Behavior normal.      Imaging: XR C-ARM NO REPORT Result Date: 09/03/2023 Please see Notes tab for imaging impression.

## 2023-09-03 NOTE — Patient Instructions (Signed)

## 2023-09-03 NOTE — Procedures (Signed)
 Spinal Cord Stimulator Trial  [name]      Date of Birth: 1960-05-10 MRN: [id number] PCP: Campbell Reynolds, NP      Visit Date: 09/03/2023   Universal Protocol:    Date/Time: 09/02/2509:28 AM  Consent Given By: the patient  Position: PRONE  Additional Comments: Vital signs were monitored before and after the procedure. Patient was prepped and draped in the usual sterile fashion. The correct patient, procedure, and site was verified.   Injection Procedure Details:  Procedure Site One Meds Administered:  Meds ordered this encounter  Medications   lidocaine  (PF) (XYLOCAINE ) 1 % injection 2 mL   cephALEXin  (KEFLEX ) 250 MG capsule    Sig: Take 1 capsule (250 mg total) by mouth 2 (two) times daily.    Dispense:  14 capsule    Refill:  0     Location/Site: Leads were placed just to the left and right of midline with the upper most lead at the top of the T7 vertebral body just at the superior endplate and then spanning 3 vertebral bodies with the inferior lead at the bottom of the T9 vertebral body.   Needle size: 14 gauge   Needle type: EPIMED RX Coud Epidural Needle  Needle Placement: T10-11 Dorsal epidural space, T12 with small riblets, Lowest vertebral body is designated L5  Findings:  -  -Comments: Excellent paresthesia coverage was obtained in all of the areas of the patient's normal pain  Procedure Details: After localization of the T11-12 epidural space and the overlying soft tissue, the needle was introduced two bodies below this level to produce an adequate angle for epidural penetration. The soft tissue was infiltrated with 2-3 mls. of 1% Lidocaine  without Epinephrine . Using the posterolateral approach, the #14 gauge Epimed needle was inserted down to the posterior aspect of the L1 lamina and then walked off the superior aspect of this lamina into the T11-12 epidural space. The epidural space was localized with loss of resistance and negative aspirate  for CSF or blood. The [employer] (16) electrode stimulation lead was passed up so that just the superior most lead was at the location noted above while maintaining the lead in the midline.   The above procedure was repeated for a second Administrator, arts [employer] (16) electrode stimulation lead for the opposite side. The pulse generator system was adjusted and programmed by myself and the company technical representative by varying the stimulator sites, rates, pulse amplitude, and duration. Adequate coverage was obtained as described above.  Subsequent to this, the introducer needle was removed and the spinal cord stimulator trial lead was fastened to the skin using steri-strips and strain reduction loop. The lead wire was then connected and Tegaderm was applied to produce an occlusive dressing. The patient was then brought out of the procedure room and was given a portable stimulator.  The patient will follow-up in three to seven days to determine whether this was efficacious.  Oral prophylactic antibiotic, either Keflex  or clindamycin, was prescribed as noted.   Additional Comments:  The patient tolerated the procedure well Dressing: As noted above    Post-procedure details: Patient was observed during the procedure. Post-procedure instructions were reviewed.  Patient left the clinic in stable condition.

## 2023-09-03 NOTE — Procedures (Signed)
Advanced programming analysis performed with the [employer] technical representative with physician input and  monitoring. [employer] FAST and contour programming with  combined therapy initiated. 

## 2023-09-09 ENCOUNTER — Encounter: Payer: Self-pay | Admitting: Physical Medicine and Rehabilitation

## 2023-09-09 ENCOUNTER — Ambulatory Visit (INDEPENDENT_AMBULATORY_CARE_PROVIDER_SITE_OTHER): Admitting: Physical Medicine and Rehabilitation

## 2023-09-09 DIAGNOSIS — M48061 Spinal stenosis, lumbar region without neurogenic claudication: Secondary | ICD-10-CM

## 2023-09-09 DIAGNOSIS — G894 Chronic pain syndrome: Secondary | ICD-10-CM

## 2023-09-09 DIAGNOSIS — M5416 Radiculopathy, lumbar region: Secondary | ICD-10-CM
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
## 2023-09-10 ENCOUNTER — Ambulatory Visit: Admitting: Physical Medicine and Rehabilitation

## 2023-09-15 ENCOUNTER — Other Ambulatory Visit: Payer: Self-pay | Admitting: Sports Medicine

## 2023-11-10 ENCOUNTER — Other Ambulatory Visit (INDEPENDENT_AMBULATORY_CARE_PROVIDER_SITE_OTHER)

## 2023-11-10 ENCOUNTER — Ambulatory Visit (INDEPENDENT_AMBULATORY_CARE_PROVIDER_SITE_OTHER): Admitting: Sports Medicine

## 2023-11-10 ENCOUNTER — Encounter: Payer: Self-pay | Admitting: Sports Medicine

## 2023-11-10 DIAGNOSIS — M62830 Muscle spasm of back: Secondary | ICD-10-CM | POA: Diagnosis not present

## 2023-11-10 DIAGNOSIS — Z981 Arthrodesis status: Secondary | ICD-10-CM

## 2023-11-10 DIAGNOSIS — M5412 Radiculopathy, cervical region: Secondary | ICD-10-CM

## 2023-11-10 DIAGNOSIS — M79601 Pain in right arm: Secondary | ICD-10-CM

## 2023-11-10 DIAGNOSIS — M792 Neuralgia and neuritis, unspecified: Secondary | ICD-10-CM

## 2023-11-10 MED ORDER — GABAPENTIN 300 MG PO CAPS: 300.0000 mg | ORAL_CAPSULE | Freq: Three times a day (TID) | ORAL | 1 refills | Status: AC
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
## 2023-11-13 ENCOUNTER — Telehealth: Payer: Self-pay | Admitting: Sports Medicine

## 2023-11-13 DIAGNOSIS — M5412 Radiculopathy, cervical region: Secondary | ICD-10-CM

## 2023-11-13 NOTE — Telephone Encounter (Signed)
 Patient's wife called. Would like to know if patient could have a referral for PT to come here?

## 2023-11-18 NOTE — Telephone Encounter (Signed)
 Referral in chart
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
## 2023-12-07 ENCOUNTER — Ambulatory Visit

## 2023-12-21 ENCOUNTER — Encounter: Payer: Self-pay | Admitting: Radiology

## 2023-12-22 ENCOUNTER — Ambulatory Visit: Admitting: Sports Medicine
